# Patient Record
Sex: Male | Born: 1965 | Race: White | Hispanic: No | Smoking: Never smoker
Health system: Southern US, Community
[De-identification: ages and names within clinical notes are randomized; demographics above are authoritative.]

## PROBLEM LIST (undated history)

## (undated) DIAGNOSIS — L97519 Non-pressure chronic ulcer of other part of right foot with unspecified severity: Secondary | ICD-10-CM

## (undated) DIAGNOSIS — T8859XA Other complications of anesthesia, initial encounter: Secondary | ICD-10-CM

## (undated) DIAGNOSIS — G4730 Sleep apnea, unspecified: Secondary | ICD-10-CM

## (undated) DIAGNOSIS — E11621 Type 2 diabetes mellitus with foot ulcer: Secondary | ICD-10-CM

## (undated) DIAGNOSIS — E669 Obesity, unspecified: Secondary | ICD-10-CM

## (undated) DIAGNOSIS — F329 Major depressive disorder, single episode, unspecified: Secondary | ICD-10-CM

## (undated) DIAGNOSIS — U071 COVID-19: Secondary | ICD-10-CM

## (undated) DIAGNOSIS — F419 Anxiety disorder, unspecified: Secondary | ICD-10-CM

## (undated) DIAGNOSIS — T7840XA Allergy, unspecified, initial encounter: Secondary | ICD-10-CM

## (undated) DIAGNOSIS — E119 Type 2 diabetes mellitus without complications: Secondary | ICD-10-CM

## (undated) DIAGNOSIS — F32A Depression, unspecified: Secondary | ICD-10-CM

## (undated) DIAGNOSIS — Z87442 Personal history of urinary calculi: Secondary | ICD-10-CM

## (undated) DIAGNOSIS — G709 Myoneural disorder, unspecified: Secondary | ICD-10-CM

## (undated) HISTORY — DX: Anxiety disorder, unspecified: F41.9

## (undated) HISTORY — DX: Sleep apnea, unspecified: G47.30

## (undated) HISTORY — DX: Myoneural disorder, unspecified: G70.9

## (undated) HISTORY — PX: AMPUTATION: SHX166

## (undated) HISTORY — DX: Allergy, unspecified, initial encounter: T78.40XA

## (undated) HISTORY — DX: Depression, unspecified: F32.A

## (undated) HISTORY — DX: Major depressive disorder, single episode, unspecified: F32.9

---

## 2002-08-29 HISTORY — PX: SPINE SURGERY: SHX786

## 2011-04-19 ENCOUNTER — Other Ambulatory Visit: Payer: Self-pay | Admitting: Podiatry

## 2011-08-15 ENCOUNTER — Other Ambulatory Visit: Payer: Self-pay | Admitting: Podiatry

## 2011-09-26 ENCOUNTER — Other Ambulatory Visit: Payer: Self-pay | Admitting: Podiatry

## 2011-09-30 LAB — WOUND CULTURE

## 2011-11-07 ENCOUNTER — Other Ambulatory Visit: Payer: Self-pay | Admitting: Podiatry

## 2011-11-11 LAB — WOUND CULTURE

## 2012-02-28 ENCOUNTER — Encounter: Payer: Self-pay | Admitting: Cardiothoracic Surgery

## 2012-02-28 ENCOUNTER — Encounter: Payer: Self-pay | Admitting: Nurse Practitioner

## 2012-06-05 ENCOUNTER — Ambulatory Visit: Payer: Self-pay | Admitting: Podiatry

## 2012-09-29 HISTORY — PX: FOOT SURGERY: SHX648

## 2013-01-24 ENCOUNTER — Ambulatory Visit (INDEPENDENT_AMBULATORY_CARE_PROVIDER_SITE_OTHER): Payer: BC Managed Care – PPO | Admitting: Internal Medicine

## 2013-01-24 ENCOUNTER — Encounter: Payer: Self-pay | Admitting: Internal Medicine

## 2013-01-24 VITALS — BP 118/78 | HR 92 | Temp 98.5°F | Resp 16 | Ht 70.0 in | Wt 234.8 lb

## 2013-01-24 DIAGNOSIS — Z87438 Personal history of other diseases of male genital organs: Secondary | ICD-10-CM

## 2013-01-24 DIAGNOSIS — G5793 Unspecified mononeuropathy of bilateral lower limbs: Secondary | ICD-10-CM

## 2013-01-24 DIAGNOSIS — L97512 Non-pressure chronic ulcer of other part of right foot with fat layer exposed: Secondary | ICD-10-CM

## 2013-01-24 DIAGNOSIS — E669 Obesity, unspecified: Secondary | ICD-10-CM

## 2013-01-24 DIAGNOSIS — G4733 Obstructive sleep apnea (adult) (pediatric): Secondary | ICD-10-CM | POA: Insufficient documentation

## 2013-01-24 DIAGNOSIS — M86671 Other chronic osteomyelitis, right ankle and foot: Secondary | ICD-10-CM | POA: Insufficient documentation

## 2013-01-24 DIAGNOSIS — D582 Other hemoglobinopathies: Secondary | ICD-10-CM

## 2013-01-24 DIAGNOSIS — E1140 Type 2 diabetes mellitus with diabetic neuropathy, unspecified: Secondary | ICD-10-CM | POA: Insufficient documentation

## 2013-01-24 DIAGNOSIS — Z87898 Personal history of other specified conditions: Secondary | ICD-10-CM

## 2013-01-24 DIAGNOSIS — L97409 Non-pressure chronic ulcer of unspecified heel and midfoot with unspecified severity: Secondary | ICD-10-CM

## 2013-01-24 DIAGNOSIS — G609 Hereditary and idiopathic neuropathy, unspecified: Secondary | ICD-10-CM

## 2013-01-24 NOTE — Assessment & Plan Note (Signed)
Secondary to prolonged disability from foot infection. Screening for thyroid, hyperlipidemia and dm. Ordered .  Low GI diet recommended pending evaluation of renal function

## 2013-01-24 NOTE — Assessment & Plan Note (Signed)
Suspected by history.  Patient prefers to lose weight rather than go for a sleep study right now.

## 2013-01-24 NOTE — Assessment & Plan Note (Signed)
Recurrent,  Suggestive of BPH.  Will need DRE and prostate exam

## 2013-01-24 NOTE — Assessment & Plan Note (Signed)
Secondary to sciatica

## 2013-01-24 NOTE — Patient Instructions (Addendum)
Return ASAP for fasting labs   Physical in 3 months   Pick up diet from [place] at [employer]

## 2013-01-24 NOTE — Assessment & Plan Note (Signed)
S/p debridement and incision and drainage of either an abscess or cyst. Awaiting records from podiatry.

## 2013-01-24 NOTE — Progress Notes (Signed)
Patient ID: [name], male   DOB: 1966/06/27, 47 y.o.   MRN: [id number]   Patient Active Problem List   Diagnosis Date Noted  . 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 Neuropathy of both feet 01/24/2013  . OSA (obstructive sleep apnea) 01/24/2013  . Obesity (BMI 30-39.9) 01/24/2013  . History of prostatitis 01/24/2013  . Abnormal hemoglobin 01/24/2013    Subjective:  CC:   Chief Complaint  Patient presents with  . Establish Care    HPI:   [name] is a 47 y.o. male who presents as a new patient to establish primary care with the chief complaint of  Right foot problems.  Started 3 years ago , preexisting neuropathy from disk problem, developed an infection after a cut on bottom of  Foot. Developed systemic symptoms and saw podiatrist [name] who said it was fine.  Recurrent swelling and systemic symptoms   Eventually treated for foot infection by [name] ,but would not heal completely so she was  referred to [name]. who surgically removed a "cyst" which finally allowed the interior to heal .  was on abx for 1.5 years .  2 years have gone by,  Which caused him to lose work and gain weight (stopped working out at gym ,  Gained 50 lbs)  and develop Depression from being combined to the house. Following up with Wound Care for wound vac,  With skin graft planned.   Foot still swells  But the ulcer has closed to 1.5 cm .    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   History of prostatitis 2 years ago treated by [name] .  2 or 3 episodes in the past .  DRE /prostate exam noted enlargement at the time of infection.  PSA was normal.  He has No urinary hesitancy or weak stream but dribbles after he is done.   [name] sleeping,  Wife says she snores, and notes apnea.  Has daytime somnolence and wakes up tired. Does not want to have a sleep study bc he feels the problem is his obesity and wants to lose the weight. Used to be an avid  exerciser.   Up to date on eye exams.  Has herpes opthalmicus requiring medication for flares   Was told he had "thick " blood never screened  for iron overload   Past Medical History  Diagnosis Date  . Depression   . Allergy     Past Surgical History  Procedure Laterality Date  . Spine surgery  2004    nerve damage in spine  . Foot surgery Right 02/14    cyst removed    Family History  Problem Relation Age of Onset  . Heart disease Father   . Stroke Father   . Heart disease Maternal Grandmother   . Stroke Maternal Grandmother   . Heart disease Paternal Grandfather   . Stroke Paternal Grandfather     History   Social History  . Marital Status: Married    Spouse Name: N/A    Number of Children: N/A  . Years of Education: N/A   Occupational History  . Not on file.   Social History Main Topics  . Smoking status: Never Smoker   . Smokeless tobacco: Never Used  . Alcohol Use: No  . Drug Use: No  . Sexually Active: Yes   Other Topics Concern  . Not on file   Social History  Narrative  . No narrative on file   No Known Allergies   Review of Systems:   Patient denies headache, fevers, malaise, unintentional weight loss, skin rash, eye pain, sinus congestion and sinus pain, sore throat, dysphagia,  hemoptysis , cough, dyspnea, wheezing, chest pain, palpitations, orthopnea, edema, abdominal pain, nausea, melena, diarrhea, constipation, flank pain, dysuria, hematuria, urinary  Frequency, nocturia, numbness, tingling, seizures,  Focal weakness, Loss of consciousness,  Tremor, insomnia, depression, anxiety, and suicidal ideation.     Objective:  BP 118/78  Pulse 92  Temp(Src) 98.5 F (36.9 C) (Oral)  Resp 16  Ht 5\' 10"  (1.778 m)  Wt 234 lb 12 oz (106.482 kg)  BMI 33.68 kg/m2  SpO2 97%  General appearance: alert, cooperative and appears stated age Ears: normal TM's and external ear canals both ears Throat: lips, mucosa, and tongue normal; teeth and gums  normal Neck: no adenopathy, no carotid bruit, supple, symmetrical, trachea midline and thyroid not enlarged, symmetric, no tenderness/mass/nodules Back: symmetric, no curvature. ROM normal. No CVA tenderness. Lungs: clear to auscultation bilaterally Heart: regular rate and rhythm, S1, S2 normal, no murmur, click, rub or gallop Abdomen: soft, non-tender; bowel sounds normal; no masses,  no organomegaly Pulses: 2+ and symmetric Skin: Skin color, texture, turgor normal. No rashes or lesions Lymph nodes: Cervical, supraclavicular, and axillary nodes normal.  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 Awaiting records from podiatry.   Neuropathy of both feet Secondary to sciatica   OSA (obstructive sleep apnea) Suspected by history.  Patient prefers to lose weight rather than go for a sleep study right now.   Obesity (BMI 30-39.9) Secondary to prolonged disability from foot infection. Screening for thyroid, hyperlipidemia and dm. Ordered .  Low GI diet recommended pending evaluation of renal function   History of prostatitis Recurrent,  Suggestive of BPH.  Will need DRE and prostate exam   Abnormal hemoglobin Screening for iron overload.   Updated Medication List Outpatient Encounter Prescriptions as of 01/24/2013  Medication Sig Dispense Refill  . Multiple Vitamins-Minerals (MULTIVITAMIN WITH MINERALS) tablet Take 1 tablet by mouth daily.       No facility-administered encounter medications on file as of 01/24/2013.     Orders Placed This Encounter  Procedures  . CBC with Differential  . Comprehensive metabolic panel  . TSH  . Lipid panel  . PSA    No Follow-up on file.

## 2013-01-24 NOTE — Assessment & Plan Note (Signed)
Screening for iron overload.

## 2013-01-28 ENCOUNTER — Other Ambulatory Visit: Payer: BC Managed Care – PPO

## 2013-01-30 ENCOUNTER — Encounter: Payer: Self-pay | Admitting: Cardiothoracic Surgery

## 2013-01-30 ENCOUNTER — Encounter: Payer: Self-pay | Admitting: Nurse Practitioner

## 2013-02-01 ENCOUNTER — Other Ambulatory Visit (INDEPENDENT_AMBULATORY_CARE_PROVIDER_SITE_OTHER): Payer: BC Managed Care – PPO

## 2013-02-01 DIAGNOSIS — L97409 Non-pressure chronic ulcer of unspecified heel and midfoot with unspecified severity: Secondary | ICD-10-CM

## 2013-02-01 DIAGNOSIS — L97512 Non-pressure chronic ulcer of other part of right foot with fat layer exposed: Secondary | ICD-10-CM

## 2013-02-01 DIAGNOSIS — Z87438 Personal history of other diseases of male genital organs: Secondary | ICD-10-CM

## 2013-02-01 DIAGNOSIS — E669 Obesity, unspecified: Secondary | ICD-10-CM

## 2013-02-01 DIAGNOSIS — Z87898 Personal history of other specified conditions: Secondary | ICD-10-CM

## 2013-02-01 LAB — COMPREHENSIVE METABOLIC PANEL
ALT: 44 U/L (ref 0–53)
AST: 37 U/L (ref 0–37)
Albumin: 3.7 g/dL (ref 3.5–5.2)
Alkaline Phosphatase: 48 U/L (ref 39–117)
BUN: 16 mg/dL (ref 6–23)
CO2: 26 mEq/L (ref 19–32)
Calcium: 8.8 mg/dL (ref 8.4–10.5)
Chloride: 106 mEq/L (ref 96–112)
Creatinine, Ser: 0.9 mg/dL (ref 0.4–1.5)
GFR: 91.98 mL/min (ref >60.00)
Glucose, Bld: 99 mg/dL (ref 70–99)
Potassium: 4.4 mEq/L (ref 3.5–5.1)
Sodium: 136 mEq/L (ref 135–145)
Total Bilirubin: 0.9 mg/dL (ref 0.3–1.2)
Total Protein: 7.4 g/dL (ref 6.0–8.3)

## 2013-02-01 LAB — CBC WITH DIFFERENTIAL/PLATELET
Basophils Absolute: 0.1 10*3/uL (ref 0.0–0.1)
Basophils Relative: 1.8 % (ref 0.0–3.0)
Eosinophils Absolute: 0.3 10*3/uL (ref 0.0–0.7)
Eosinophils Relative: 4.6 % (ref 0.0–5.0)
HCT: 50.1 % (ref 39.0–52.0)
Hemoglobin: 16.8 g/dL (ref 13.0–17.0)
Lymphocytes Relative: 27.3 % (ref 12.0–46.0)
Lymphs Abs: 1.8 10*3/uL (ref 0.7–4.0)
MCHC: 33.6 g/dL (ref 30.0–36.0)
MCV: 91.1 fl (ref 78.0–100.0)
Monocytes Absolute: 0.8 10*3/uL (ref 0.1–1.0)
Monocytes Relative: 12.8 % — ABNORMAL HIGH (ref 3.0–12.0)
Neutro Abs: 3.5 10*3/uL (ref 1.4–7.7)
Neutrophils Relative %: 53.5 % (ref 43.0–77.0)
Platelets: 213 10*3/uL (ref 150.0–400.0)
RBC: 5.5 Mil/uL (ref 4.22–5.81)
RDW: 15.6 % — ABNORMAL HIGH (ref 11.5–14.6)
WBC: 6.6 10*3/uL (ref 4.5–10.5)

## 2013-02-01 LAB — LIPID PANEL
Cholesterol: 175 mg/dL (ref 0–200)
HDL: 33.3 mg/dL — ABNORMAL LOW (ref >39.00)
LDL Cholesterol: 118 mg/dL — ABNORMAL HIGH (ref 0–99)
Total CHOL/HDL Ratio: 5
Triglycerides: 117 mg/dL (ref 0.0–149.0)
VLDL: 23.4 mg/dL (ref 0.0–40.0)

## 2013-02-01 LAB — TSH: TSH: 1.4 u[IU]/mL (ref 0.35–5.50)

## 2013-02-01 LAB — PSA: PSA: 0.38 ng/mL (ref 0.10–4.00)

## 2013-02-12 ENCOUNTER — Ambulatory Visit: Payer: Self-pay | Admitting: Nurse Practitioner

## 2013-02-15 ENCOUNTER — Ambulatory Visit: Payer: Self-pay | Admitting: Nurse Practitioner

## 2013-04-22 ENCOUNTER — Telehealth: Payer: Self-pay | Admitting: Internal Medicine

## 2013-04-22 NOTE — Telephone Encounter (Signed)
The patient was told that his labs were normal ,but when he went to [facility] he was told his hemaglobin was low and his cholesterol was high according to the labs that they pulled from the records here. His wife is wanting a call back to have someone read the lab results to her . She also wants a copy sent in the mail.

## 2013-04-30 NOTE — Telephone Encounter (Signed)
Patient advised of labs as normal and explained with in limits left on voicemail per DPR and labs mailed to patient.

## 2013-04-30 NOTE — Telephone Encounter (Signed)
The patient is wanting a copy of his labs sent to him in the mail.

## 2013-05-08 ENCOUNTER — Ambulatory Visit (INDEPENDENT_AMBULATORY_CARE_PROVIDER_SITE_OTHER): Payer: BC Managed Care – PPO | Admitting: Internal Medicine

## 2013-05-08 ENCOUNTER — Encounter: Payer: Self-pay | Admitting: Internal Medicine

## 2013-05-08 VITALS — BP 120/80 | HR 84 | Temp 98.2°F | Resp 14 | Ht 70.0 in | Wt 234.5 lb

## 2013-05-08 DIAGNOSIS — E669 Obesity, unspecified: Secondary | ICD-10-CM

## 2013-05-08 DIAGNOSIS — Z87438 Personal history of other diseases of male genital organs: Secondary | ICD-10-CM

## 2013-05-08 DIAGNOSIS — Z87898 Personal history of other specified conditions: Secondary | ICD-10-CM

## 2013-05-08 DIAGNOSIS — Z0000 Encounter for general adult medical examination without abnormal findings: Secondary | ICD-10-CM

## 2013-05-08 DIAGNOSIS — L97512 Non-pressure chronic ulcer of other part of right foot with fat layer exposed: Secondary | ICD-10-CM

## 2013-05-08 DIAGNOSIS — D582 Other hemoglobinopathies: Secondary | ICD-10-CM

## 2013-05-08 DIAGNOSIS — L97409 Non-pressure chronic ulcer of unspecified heel and midfoot with unspecified severity: Secondary | ICD-10-CM

## 2013-05-08 DIAGNOSIS — Z23 Encounter for immunization: Secondary | ICD-10-CM

## 2013-05-08 LAB — PSA: PSA: 0.35 ng/mL (ref 0.10–4.00)

## 2013-05-08 LAB — URINALYSIS
Bilirubin Urine: NEGATIVE
Ketones, ur: NEGATIVE
Leukocytes, UA: NEGATIVE
Nitrite: NEGATIVE
Specific Gravity, Urine: >=1.03 (ref 1.000–1.030)
Total Protein, Urine: NEGATIVE
Urine Glucose: NEGATIVE
Urobilinogen, UA: 0.2 (ref 0.0–1.0)
pH: 6 (ref 5.0–8.0)

## 2013-05-08 NOTE — Progress Notes (Signed)
Patient ID: [name], male   DOB: 03/23/1966, 47 y.o.   MRN: [id number]  Patient Active Problem List   Diagnosis Date Noted  . Routine general medical examination at a health care facility 05/09/2013  . 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 Neuropathy of both feet 01/24/2013  . OSA (obstructive sleep apnea) 01/24/2013  . Obesity (BMI 30-39.9) 01/24/2013  . History of prostatitis 01/24/2013  . Abnormal hemoglobin 01/24/2013    Subjective:  CC:   Chief Complaint  Patient presents with  . Annual Exam    HPI:   [name] a 47 y.o. male who presents for an  annual exam. He continues to have aright foot ulcer,  it occurred as a result of bilateral neuropathy secondary to sciatica. He also has been present for nearly 3 years now and is now currently managed by [facility] podiatry since local treatment by Dr. Orland Jarred followed by Dr. Al Corpus fail to resolve his ulcer. Had a metatarsal removed due to osteomyelitis  at [facility]  6 weeks ago .  Still has another bone infected and trying to decide whether to remove bone or do long term antibiotics  Was sent by [facility] to internal medicine at [facility] and told he had hyperlipidemia and anemia > LDL was < 130.   Past Medical History  Diagnosis Date  . Depression   . Allergy     Past Surgical History  Procedure Laterality Date  . Spine surgery  2004    nerve damage in spine  . Foot surgery Right 02/14    cyst removed       The following portions of the patient's history were reviewed and updated as appropriate: Allergies, current medications, and problem list.    Review of Systems:   12 Pt  review of systems was negative except those addressed in the HPI,     History   Social History  . Marital Status: Married    Spouse Name: N/A    Number of Children: N/A  . Years of Education: N/A   Occupational History  . Not on file.   Social History Main Topics  . Smoking status: Never Smoker   . Smokeless  tobacco: Never Used  . Alcohol Use: No  . Drug Use: No  . Sexual Activity: Yes   Other Topics Concern  . Not on file   Social History Narrative  . No narrative on file    Objective:  Filed Vitals:   05/08/13 0852  BP: 120/80  Pulse: 84  Temp: 98.2 F (36.8 C)  Resp: 14     General appearance: alert, cooperative and appears stated age Ears: normal TM's and external ear canals both ears Throat: lips, mucosa, and tongue normal; teeth and gums normal Neck: no adenopathy, no carotid bruit, supple, symmetrical, trachea midline and thyroid not enlarged, symmetric, no tenderness/mass/nodules Back: symmetric, no curvature. ROM normal. No CVA tenderness. Lungs: clear to auscultation bilaterally Heart: regular rate and rhythm, S1, S2 normal, no murmur, click, rub or gallop Abdomen: soft, non-tender; bowel sounds normal; no masses,  no organomegaly Pulses: 2+ and symmetric Skin: Skin color, texture, turgor normal. No rashes or lesions Lymph nodes: Cervical, supraclavicular, and axillary nodes normal.  Assessment and Plan:  History of prostatitis Prostate exam was normal today and PSA is pending.  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 Surgery was done at  [facility] to podiatry. He has persistent osteomyelitis involving another metatarsal and is considering additional surgery versus prolonged IV therapy with antibiotics and he is wanting my opinion. Since the toe that is affected is no longer weight bearing and he has already been on antibiotics with no significant improvement IM inclined to go with the surgeon meds indication of the great toe is the best option since it continues to have a sinus tract. He has been in a boot now for nearly 3 years and wants to get on with his life.  Abnormal hemoglobin He was screened for iron overload at last visit and has no signs of  such.  Obesity (BMI 30-39.9) His ability to lose weight has been hampered by his osteomyelitis and foot ulcer.I have addressed  BMI and recommended wt loss of 10% of body weight over the next 6 months using a low glycemic index diet and when able, regular exercise a minimum of 5 days per week.    Routine general medical examination at a health care facility Annual male exam was done including testicular and prostate exam. PSA is pending .     Updated Medication List Outpatient Encounter Prescriptions as of 05/08/2013  Medication Sig Dispense Refill  . amoxicillin-clavulanate (AUGMENTIN) 875-125 MG per tablet Take 1 tablet by mouth 2 (two) times daily. Take 1 tablet by mouth Two (2) times a day. for 7 days      . Multiple Vitamins-Minerals (MULTIVITAMIN WITH MINERALS) tablet Take 1 tablet by mouth daily.       No facility-administered encounter medications on file as of 05/08/2013.     Orders Placed This Encounter  Procedures  . Flu Vaccine QUAD 36+ mos PF IM (Fluarix)  . Tdap vaccine greater than or equal to 7yo IM  . PSA  . Urinalysis    No Follow-up on file.

## 2013-05-08 NOTE — Patient Instructions (Addendum)

## 2013-05-09 ENCOUNTER — Encounter: Payer: Self-pay | Admitting: Internal Medicine

## 2013-05-09 DIAGNOSIS — Z0000 Encounter for general adult medical examination without abnormal findings: Secondary | ICD-10-CM | POA: Insufficient documentation

## 2013-05-09 NOTE — Assessment & Plan Note (Signed)
Patient is now status post metatarsal indication for osteomyelitis due to persistent sinus tract. Surgery was done at [facility] to podiatry. He has persistent osteomyelitis involving another metatarsal and is considering additional surgery versus prolonged IV therapy with antibiotics and he is wanting my opinion. Since the toe that is affected is no longer weight bearing and he has already been on antibiotics with no significant improvement IM inclined to go with the surgeon meds indication of the great toe is the best option since it continues to have a sinus tract. He has been in a boot now for nearly 3 years and wants to get on with his life.

## 2013-05-09 NOTE — Assessment & Plan Note (Signed)
He was screened for iron overload at last visit and has no signs of such.

## 2013-05-09 NOTE — Assessment & Plan Note (Signed)
Prostate exam was normal today and PSA is pending.

## 2013-05-09 NOTE — Assessment & Plan Note (Signed)
Annual male exam was done including testicular and prostate exam. PSA is pending .     

## 2013-05-09 NOTE — Assessment & Plan Note (Signed)
His ability to lose weight has been hampered by his osteomyelitis and foot ulcer.I have addressed  BMI and recommended wt loss of 10% of body weight over the next 6 months using a low glycemic index diet and when able, regular exercise a minimum of 5 days per week.

## 2013-05-10 ENCOUNTER — Encounter: Payer: Self-pay | Admitting: *Deleted

## 2013-05-20 ENCOUNTER — Encounter: Payer: Self-pay | Admitting: Podiatry

## 2013-10-24 ENCOUNTER — Ambulatory Visit: Payer: BC Managed Care – PPO | Admitting: Internal Medicine

## 2013-10-29 ENCOUNTER — Ambulatory Visit: Payer: BC Managed Care – PPO | Admitting: Internal Medicine

## 2013-11-05 ENCOUNTER — Encounter: Payer: Self-pay | Admitting: Internal Medicine

## 2013-11-05 ENCOUNTER — Ambulatory Visit (INDEPENDENT_AMBULATORY_CARE_PROVIDER_SITE_OTHER): Payer: BC Managed Care – PPO | Admitting: Internal Medicine

## 2013-11-05 VITALS — BP 116/70 | HR 101 | Temp 97.9°F | Resp 16 | Wt 236.5 lb

## 2013-11-05 DIAGNOSIS — R6882 Decreased libido: Secondary | ICD-10-CM

## 2013-11-05 DIAGNOSIS — R5381 Other malaise: Secondary | ICD-10-CM

## 2013-11-05 DIAGNOSIS — R0609 Other forms of dyspnea: Secondary | ICD-10-CM

## 2013-11-05 DIAGNOSIS — F520 Hypoactive sexual desire disorder: Secondary | ICD-10-CM

## 2013-11-05 DIAGNOSIS — R0989 Other specified symptoms and signs involving the circulatory and respiratory systems: Secondary | ICD-10-CM

## 2013-11-05 DIAGNOSIS — E669 Obesity, unspecified: Secondary | ICD-10-CM

## 2013-11-05 DIAGNOSIS — M869 Osteomyelitis, unspecified: Secondary | ICD-10-CM

## 2013-11-05 DIAGNOSIS — R5383 Other fatigue: Principal | ICD-10-CM

## 2013-11-05 DIAGNOSIS — R0683 Snoring: Secondary | ICD-10-CM

## 2013-11-05 DIAGNOSIS — G4710 Hypersomnia, unspecified: Secondary | ICD-10-CM

## 2013-11-05 DIAGNOSIS — E559 Vitamin D deficiency, unspecified: Secondary | ICD-10-CM

## 2013-11-05 DIAGNOSIS — G4733 Obstructive sleep apnea (adult) (pediatric): Secondary | ICD-10-CM

## 2013-11-05 NOTE — Patient Instructions (Signed)
We discussed testing you for sleep apnea,  Low testosterone,  Low thyroid etc.  If all the tests are negative,  Treating your for depression may help.  Please schedules a fasting lab appt ASAP  Also schedule a return visit with me in 4 weeks  Amber will call you with the appt for the sleep study.  I will need to call you in something to take before the study to help you fall asleep

## 2013-11-05 NOTE — Progress Notes (Signed)
Patient ID: [name], male   DOB: [birth year], 48 y.o.   MRN: [id number]  Patient Active Problem List   Diagnosis Date Noted  . Other malaise and fatigue 11/06/2013  . Routine general medical examination at a health care facility 05/09/2013  . Chronic osteomyelitis of right foot 01/24/2013  . Neuropathy of both feet 01/24/2013  . OSA (obstructive sleep apnea) 01/24/2013  . Obesity (BMI 30-39.9) 01/24/2013  . History of prostatitis 01/24/2013  . Abnormal hemoglobin 01/24/2013    Subjective:  CC:   Chief Complaint  Patient presents with  . Depression    Male issues    HPI:   [name] is a 48 y.o. male who presents for Decreased energy, lack of libido,  Snoring ,  Fatigue and Anhedonia.  Patient last seen Sept 2014.  has had a long ordeal with chronic osteomyelitisof the right foot which started with a nonhealing ulcer 3 years ago,  requiring prolonged  treatment including recurrent amputations.  He finally agreed to amputation of the great toe last Fall and the infection is considered resolved per patient.  His treatment is finally complete,  toes are improving, (right foot) and he was finally been cleared to return to his former level of activity which included regular exercise at  the gym.  He has been starting slowly, using dumbbells for the past few weeks.  Despite being back at the gym for a month,  He notes lack of energy,  And has to force himself to go to the gym.  His mother in law suggested that he may have  low testosterone.  He states that he is able to have an erection , but has not had a sexual relationship with wife in a while and has only masturbated, but has noted that his erections are less strong.     His wife reports loud snoring and is concerned he may have sleep apnea. He has gained a significant amount of weight due to inactivity and has a large neck and central obesity.    Past Medical History  Diagnosis Date  . Depression   . Allergy     Past  Surgical History  Procedure Laterality Date  . Spine surgery  2004    nerve damage in spine  . Foot surgery Right 02/14    cyst removed       The following portions of the patient's history were reviewed and updated as appropriate: Allergies, current medications, and problem list.    Review of Systems:   Patient denies headache, fevers, malaise, unintentional weight loss, skin rash, eye pain, sinus congestion and sinus pain, sore throat, dysphagia,  hemoptysis , cough, dyspnea, wheezing, chest pain, palpitations, orthopnea, edema, abdominal pain, nausea, melena, diarrhea, constipation, flank pain, dysuria, hematuria, urinary  Frequency, nocturia, numbness, tingling, seizures,  Focal weakness, Loss of consciousness,  Tremor, insomnia, depression, anxiety, and suicidal ideation.     History   Social History  . Marital Status: Married    Spouse Name: N/A    Number of Children: N/A  . Years of Education: N/A   Occupational History  . Not on file.   Social History Main Topics  . Smoking status: Never Smoker   . Smokeless tobacco: Never Used  . Alcohol Use: No  . Drug Use: No  . Sexual Activity: Yes   Other Topics Concern  . Not on file   Social History Narrative  . No narrative on file    Objective:  Filed Vitals:  11/05/13 1406  BP: 116/70  Pulse: 101  Temp: 97.9 F (36.6 C)  Resp: 16     General appearance: alert, cooperative and appears stated age Ears: normal TM's and external ear canals both ears Throat: lips, mucosa, and tongue normal; teeth and gums normal Neck: no adenopathy, no carotid bruit, supple, symmetrical, trachea midline and thyroid not enlarged, symmetric, no tenderness/mass/nodules Back: symmetric, no curvature. ROM normal. No CVA tenderness. Lungs: clear to auscultation bilaterally Heart: regular rate and rhythm, S1, S2 normal, no murmur, click, rub or gallop Abdomen: soft, non-tender; bowel sounds normal; no masses,  no  organomegaly Pulses: 2+ and symmetric Skin: Skin color, texture, turgor normal. No rashes or lesions Lymph nodes: Cervical, supraclavicular, and axillary nodes normal.  Assessment and Plan:  Obesity (BMI 30-39.9) I have addressed  BMI and recommended wt loss of 10% of body weight over the next 6 months using a low glycemic index diet and regular exercise a minimum of 5 days per week.  He will return for fasting labs and screening for DM.    OSA (obstructive sleep apnea) Suspected by history and wife's observations .  Patient is now willing to have a sleep study given his inabilkity to lose weight in 6 months  And multiple symptoms .  Chronic osteomyelitis of right foot Patient is now status post metatarsal indication for osteomyelitis due to persistent sinus tract. All Surgery has been done at [facility]. He has finally had resolution after 3 years of persistent infection s/p great toe amputation   Other malaise and fatigue Etiology may be low T4,  Low T, untreated OSA, anemia, or depression.  Screening labs and sleep study ordered,  Will consider trial of wellbutrin if labs are not indicate of a metabolic cause.   A total of 40 minutes was spent with patient more than half of which was spent in counseling, reviewing records from other prviders and coordination of care.  Updated Medication List Outpatient Encounter Prescriptions as of 11/05/2013  Medication Sig  . Multiple Vitamins-Minerals (MULTIVITAMIN WITH MINERALS) tablet Take 1 tablet by mouth daily.  Marland Kitchen oxyCODONE (OXY IR/ROXICODONE) 5 MG immediate release tablet Take 5 mg by mouth every 6 (six) hours as needed.      Orders Placed This Encounter  Procedures  . CBC with Differential  . Comprehensive metabolic panel  . TSH  . Lipid panel  . Testosterone, free, total  . Vit D  25 hydroxy (rtn osteoporosis monitoring)  . Hemoglobin A1c  . Nocturnal polysomnography (NPSG)    No Follow-up on file.

## 2013-11-05 NOTE — Progress Notes (Signed)
Pre-visit discussion using our clinic review tool. No additional management support is needed unless otherwise documented below in the visit note.  

## 2013-11-06 ENCOUNTER — Other Ambulatory Visit (INDEPENDENT_AMBULATORY_CARE_PROVIDER_SITE_OTHER): Payer: BC Managed Care – PPO

## 2013-11-06 ENCOUNTER — Encounter: Payer: Self-pay | Admitting: Internal Medicine

## 2013-11-06 DIAGNOSIS — R5381 Other malaise: Secondary | ICD-10-CM | POA: Insufficient documentation

## 2013-11-06 DIAGNOSIS — F32A Depression, unspecified: Secondary | ICD-10-CM

## 2013-11-06 DIAGNOSIS — F329 Major depressive disorder, single episode, unspecified: Secondary | ICD-10-CM

## 2013-11-06 DIAGNOSIS — E669 Obesity, unspecified: Secondary | ICD-10-CM

## 2013-11-06 DIAGNOSIS — R5383 Other fatigue: Secondary | ICD-10-CM

## 2013-11-06 DIAGNOSIS — F520 Hypoactive sexual desire disorder: Secondary | ICD-10-CM

## 2013-11-06 DIAGNOSIS — E559 Vitamin D deficiency, unspecified: Secondary | ICD-10-CM

## 2013-11-06 LAB — CBC WITH DIFFERENTIAL/PLATELET
Basophils Absolute: 0.1 10*3/uL (ref 0.0–0.1)
Basophils Relative: 1.3 % (ref 0.0–3.0)
Eosinophils Absolute: 0.3 10*3/uL (ref 0.0–0.7)
Eosinophils Relative: 5.2 % — ABNORMAL HIGH (ref 0.0–5.0)
HCT: 47.6 % (ref 39.0–52.0)
Hemoglobin: 16.2 g/dL (ref 13.0–17.0)
Lymphocytes Relative: 31.7 % (ref 12.0–46.0)
Lymphs Abs: 2 10*3/uL (ref 0.7–4.0)
MCHC: 33.9 g/dL (ref 30.0–36.0)
MCV: 92.9 fl (ref 78.0–100.0)
Monocytes Absolute: 0.8 10*3/uL (ref 0.1–1.0)
Monocytes Relative: 12.7 % — ABNORMAL HIGH (ref 3.0–12.0)
Neutro Abs: 3.1 10*3/uL (ref 1.4–7.7)
Neutrophils Relative %: 49.1 % (ref 43.0–77.0)
Platelets: 183 10*3/uL (ref 150.0–400.0)
RBC: 5.13 Mil/uL (ref 4.22–5.81)
RDW: 14.4 % (ref 11.5–14.6)
WBC: 6.3 10*3/uL (ref 4.5–10.5)

## 2013-11-06 LAB — COMPREHENSIVE METABOLIC PANEL
ALT: 45 U/L (ref 0–53)
AST: 48 U/L — ABNORMAL HIGH (ref 0–37)
Albumin: 4 g/dL (ref 3.5–5.2)
Alkaline Phosphatase: 54 U/L (ref 39–117)
BUN: 12 mg/dL (ref 6–23)
CO2: 22 mEq/L (ref 19–32)
Calcium: 8.5 mg/dL (ref 8.4–10.5)
Chloride: 106 mEq/L (ref 96–112)
Creatinine, Ser: 0.9 mg/dL (ref 0.4–1.5)
GFR: 96.46 mL/min (ref >60.00)
Glucose, Bld: 118 mg/dL — ABNORMAL HIGH (ref 70–99)
Potassium: 3.8 mEq/L (ref 3.5–5.1)
Sodium: 137 mEq/L (ref 135–145)
Total Bilirubin: 0.9 mg/dL (ref 0.3–1.2)
Total Protein: 7.4 g/dL (ref 6.0–8.3)

## 2013-11-06 LAB — HEMOGLOBIN A1C: Hgb A1c MFr Bld: 5.6 % (ref 4.6–6.5)

## 2013-11-06 LAB — LIPID PANEL
Cholesterol: 164 mg/dL (ref 0–200)
HDL: 33 mg/dL — ABNORMAL LOW (ref >39.00)
LDL Cholesterol: 107 mg/dL — ABNORMAL HIGH (ref 0–99)
Total CHOL/HDL Ratio: 5
Triglycerides: 119 mg/dL (ref 0.0–149.0)
VLDL: 23.8 mg/dL (ref 0.0–40.0)

## 2013-11-06 LAB — TSH: TSH: 1.35 u[IU]/mL (ref 0.35–5.50)

## 2013-11-06 NOTE — Assessment & Plan Note (Signed)
Suspected by history and wife's observations .  Patient is now willing to have a sleep study given his inabilkity to lose weight in 6 months  And multiple symptoms .

## 2013-11-06 NOTE — Assessment & Plan Note (Addendum)
Patient is now status post metatarsal indication for osteomyelitis due to persistent sinus tract. All Surgery has been done at [facility]. He has finally had resolution after 3 years of persistent infection s/p great toe amputation

## 2013-11-06 NOTE — Assessment & Plan Note (Signed)
Etiology may be low T4,  Low T, untreated OSA, anemia, or depression.  Screening labs and sleep study ordered,  Will consider trial of wellbutrin if labs are not indicate of a metabolic cause.

## 2013-11-06 NOTE — Assessment & Plan Note (Addendum)
I have addressed  BMI and recommended wt loss of 10% of body weight over the next 6 months using a low glycemic index diet and regular exercise a minimum of 5 days per week.  He will return for fasting labs and screening for DM.

## 2013-11-07 LAB — VITAMIN D 25 HYDROXY (VIT D DEFICIENCY, FRACTURES): Vit D, 25-Hydroxy: 23 ng/mL — ABNORMAL LOW (ref 30–89)

## 2013-11-07 LAB — TESTOSTERONE, FREE, TOTAL, SHBG
Sex Hormone Binding: 34 nmol/L (ref 13–71)
Testosterone, Free: 69.2 pg/mL (ref 47.0–244.0)
Testosterone-% Free: 2 % (ref 1.6–2.9)
Testosterone: 352 ng/dL (ref 300–890)

## 2013-11-08 ENCOUNTER — Encounter: Payer: Self-pay | Admitting: *Deleted

## 2013-11-08 DIAGNOSIS — F329 Major depressive disorder, single episode, unspecified: Secondary | ICD-10-CM | POA: Insufficient documentation

## 2013-11-08 DIAGNOSIS — F32A Depression, unspecified: Secondary | ICD-10-CM | POA: Insufficient documentation

## 2013-11-08 DIAGNOSIS — E559 Vitamin D deficiency, unspecified: Secondary | ICD-10-CM | POA: Insufficient documentation

## 2013-11-08 MED ORDER — BUPROPION HCL 100 MG PO TABS: 100.0000 mg | ORAL_TABLET | Freq: Two times a day (BID) | ORAL | Status: DC

## 2013-11-08 MED ORDER — ERGOCALCIFEROL 1.25 MG (50000 UT) PO CAPS: 50000.0000 [IU] | ORAL_CAPSULE | ORAL | Status: DC

## 2013-11-08 NOTE — Addendum Note (Signed)
Addended by: Crecencio Mc on: 11/08/2013 09:04 AM   Modules accepted: Orders

## 2013-11-08 NOTE — Addendum Note (Signed)
Addended by: Crecencio Mc on: 11/08/2013 09:02 AM   Modules accepted: Orders

## 2013-11-27 ENCOUNTER — Ambulatory Visit: Payer: Self-pay | Admitting: Internal Medicine

## 2013-12-03 ENCOUNTER — Telehealth: Payer: Self-pay | Admitting: Internal Medicine

## 2013-12-03 DIAGNOSIS — E785 Hyperlipidemia, unspecified: Secondary | ICD-10-CM

## 2013-12-03 DIAGNOSIS — E669 Obesity, unspecified: Secondary | ICD-10-CM

## 2013-12-03 NOTE — Telephone Encounter (Signed)
Please advise 

## 2013-12-03 NOTE — Telephone Encounter (Signed)
Appointment scheduled.

## 2013-12-03 NOTE — Telephone Encounter (Signed)
Fasting labs ordered

## 2013-12-03 NOTE — Telephone Encounter (Signed)
The patient is wanting blood work done prior to his office visit on 4.13.15. He has been on a diet and he wants to know if his labs have changed.

## 2013-12-04 ENCOUNTER — Telehealth: Payer: Self-pay | Admitting: Internal Medicine

## 2013-12-04 DIAGNOSIS — G4733 Obstructive sleep apnea (adult) (pediatric): Secondary | ICD-10-CM

## 2013-12-04 NOTE — Telephone Encounter (Signed)
He has Mild to moderate sleep apnea Confirmed with sleep study.  He will need a Repeat study with CPAP needed  Due o difficulty maintaining sleep.  Thi s has been ordered

## 2013-12-04 NOTE — Telephone Encounter (Signed)
Left message for patient to call office.  

## 2013-12-05 ENCOUNTER — Other Ambulatory Visit: Payer: BC Managed Care – PPO

## 2013-12-09 ENCOUNTER — Ambulatory Visit (INDEPENDENT_AMBULATORY_CARE_PROVIDER_SITE_OTHER): Payer: BC Managed Care – PPO | Admitting: Internal Medicine

## 2013-12-09 ENCOUNTER — Encounter: Payer: Self-pay | Admitting: Internal Medicine

## 2013-12-09 VITALS — BP 112/70 | HR 98 | Temp 97.6°F | Resp 16 | Wt 229.2 lb

## 2013-12-09 DIAGNOSIS — F32A Depression, unspecified: Secondary | ICD-10-CM

## 2013-12-09 DIAGNOSIS — G4733 Obstructive sleep apnea (adult) (pediatric): Secondary | ICD-10-CM

## 2013-12-09 DIAGNOSIS — M86671 Other chronic osteomyelitis, right ankle and foot: Secondary | ICD-10-CM

## 2013-12-09 DIAGNOSIS — F3289 Other specified depressive episodes: Secondary | ICD-10-CM

## 2013-12-09 DIAGNOSIS — M86679 Other chronic osteomyelitis, unspecified ankle and foot: Secondary | ICD-10-CM

## 2013-12-09 DIAGNOSIS — R5383 Other fatigue: Secondary | ICD-10-CM

## 2013-12-09 DIAGNOSIS — E669 Obesity, unspecified: Secondary | ICD-10-CM

## 2013-12-09 DIAGNOSIS — R5381 Other malaise: Secondary | ICD-10-CM

## 2013-12-09 DIAGNOSIS — E559 Vitamin D deficiency, unspecified: Secondary | ICD-10-CM

## 2013-12-09 DIAGNOSIS — F329 Major depressive disorder, single episode, unspecified: Secondary | ICD-10-CM

## 2013-12-09 MED ORDER — BUPROPION HCL 100 MG PO TABS: 100.0000 mg | ORAL_TABLET | Freq: Two times a day (BID) | ORAL | Status: DC

## 2013-12-09 MED ORDER — ERGOCALCIFEROL 1.25 MG (50000 UT) PO CAPS: 50000.0000 [IU] | ORAL_CAPSULE | ORAL | Status: DC

## 2013-12-09 MED ORDER — HYDROCODONE-ACETAMINOPHEN 10-325 MG PO TABS
1.0000 | ORAL_TABLET | Freq: Three times a day (TID) | ORAL | Status: DC | PRN
Start: 2013-12-09 — End: 2015-01-16

## 2013-12-09 NOTE — Assessment & Plan Note (Addendum)
Repeat study has not been done yet secondary to patient having a miserable study due to all the  Electrodes and monitors that were applied.  Discussion of long term complications of untreated OSA .  Ok to postpone CPAP titration study for a few months and continue with weight loss efforts.    

## 2013-12-09 NOTE — Progress Notes (Signed)
Patient ID: [name], male   DOB: 19-Oct-1965, 48 y.o.   MRN: [id number]  Patient Active Problem List   Diagnosis Date Noted  . Depression 11/08/2013  . Unspecified vitamin D deficiency 11/08/2013  . Other malaise and fatigue 11/06/2013  . Routine general medical examination at a health care facility 05/09/2013  . Chronic osteomyelitis of right foot 01/24/2013  . Neuropathy of both feet 01/24/2013  . OSA (obstructive sleep apnea) 01/24/2013  . Obesity (BMI 30-39.9) 01/24/2013  . History of prostatitis 01/24/2013  . Abnormal hemoglobin 01/24/2013    Subjective:  CC:   Chief Complaint  Patient presents with  . Follow-up    1 month    HPI:   [name] is a 48 y.o. male who presents for follow up on multiple issues  Raised one month ago, including  obesity,  Depression, dyslipidemia,  Glucose intolerance and newly diagnosed sleep apnea  He has Lost 7 lbs in one month following low GI diet .  Has not started exercise program yet because of concerns about recent foot surgery.   Sleep study confirmed OSA but experience was so terrible he has deferred CPAP titration study at this time  Due to out of pocket expenses and financial considerations.   Has not started medication for depression yet . Some financial concerns,  Concerns about side effects.    All questions answered fully  40 minute visit,     Past Medical History  Diagnosis Date  . Depression   . Allergy     Past Surgical History  Procedure Laterality Date  . Spine surgery  2004    nerve damage in spine  . Foot surgery Right 02/14    cyst removed       The following portions of the patient's history were reviewed and updated as appropriate: Allergies, current medications, and problem list.    Review of Systems:   Patient denies headache, fevers, malaise, unintentional weight loss, skin rash, eye pain, sinus congestion and sinus pain, sore throat, dysphagia,  hemoptysis , cough, dyspnea, wheezing,  chest pain, palpitations, orthopnea, edema, abdominal pain, nausea, melena, diarrhea, constipation, flank pain, dysuria, hematuria, urinary  Frequency, nocturia, numbness, tingling, seizures,  Focal weakness, Loss of consciousness,  Tremor, insomnia, depression, anxiety, and suicidal ideation.     History   Social History  . Marital Status: Married    Spouse Name: N/A    Number of Children: N/A  . Years of Education: N/A   Occupational History  . Not on file.   Social History Main Topics  . Smoking status: Never Smoker   . Smokeless tobacco: Never Used  . Alcohol Use: No  . Drug Use: No  . Sexual Activity: Yes   Other Topics Concern  . Not on file   Social History Narrative  . No narrative on file    Objective:  Filed Vitals:   12/09/13 0910  BP: 112/70  Pulse: 98  Temp: 97.6 F (36.4 C)  Resp: 16     General appearance: alert, cooperative and appears stated age Ears: normal TM's and external ear canals both ears Throat: lips, mucosa, and tongue normal; teeth and gums normal Neck: no adenopathy, no carotid bruit, supple, symmetrical, trachea midline and thyroid not enlarged, symmetric, no tenderness/mass/nodules Back: symmetric, no curvature. ROM normal. No CVA tenderness. Lungs: clear to auscultation bilaterally Heart: regular rate and rhythm, S1, S2 normal, no murmur, click, rub or gallop Abdomen: soft, non-tender; bowel sounds normal; no masses,  no  organomegaly Pulses: 2+ and symmetric Skin: Skin color, texture, turgor normal. No rashes or lesions Lymph nodes: Cervical, supraclavicular, and axillary nodes normal.  Assessment and Plan:  OSA (obstructive sleep apnea) Repeat study has not been done yet secondary to patient having a miserable study due to all the  Electrodes and monitors that were applied.  Discussion of long term complications of untreated OSA .  Ok to postpone CPAP titration study for a few months and continue with weight loss efforts.    Obesity (BMI 30-39.9) I have congratulated him in reduction of wt by 7 lbs in one month and encouraged  Continued weight loss with goal of 10% of body weight over the next 6 months using a low glycemic index diet and regular exercise a minimum of 5 days per week.    Depression Discussion of risks and benefits of anti depressant medication . He will proceed with trial of wellbutrin   Chronic osteomyelitis of right foot Resolved after 3 years of persistent infection, resulting in temporary disability. Marland Kitchen  He is awaiting follow up with [facility] podiatry before re entering employment arena which I agree is reasonable   Other malaise and fatigue Multifactorial,  From depression,  Sleep apnea, and deconditioning,  All have been addressed.   Unspecified vitamin D deficiency He has not taken the megadose of Drisdol which I have recommended he doe for one month,    A total of 40 minutes was spent with patient more than half of which was spent in counseling, reviewing records from other prviders and coordination of care.  Updated Medication List Outpatient Encounter Prescriptions as of 12/09/2013  Medication Sig  . buPROPion (WELLBUTRIN) 100 MG tablet Take 1 tablet (100 mg total) by mouth 2 (two) times daily.  . ergocalciferol (DRISDOL) 50000 UNITS capsule Take 1 capsule (50,000 Units total) by mouth once a week.  Marland Kitchen HYDROcodone-acetaminophen (NORCO) 10-325 MG per tablet Take 1 tablet by mouth every 8 (eight) hours as needed.  . Multiple Vitamins-Minerals (MULTIVITAMIN WITH MINERALS) tablet Take 1 tablet by mouth daily.  . [DISCONTINUED] buPROPion (WELLBUTRIN) 100 MG tablet Take 1 tablet (100 mg total) by mouth 2 (two) times daily.  . [DISCONTINUED] ergocalciferol (DRISDOL) 50000 UNITS capsule Take 1 capsule (50,000 Units total) by mouth once a week.  . [DISCONTINUED] oxyCODONE (OXY IR/ROXICODONE) 5 MG immediate release tablet Take 5 mg by mouth every 6 (six) hours as needed.      No orders of the  defined types were placed in this encounter.    Return in about 3 months (around 03/10/2014).

## 2013-12-09 NOTE — Progress Notes (Signed)
Pre-visit discussion using our clinic review tool. No additional management support is needed unless otherwise documented below in the visit note.  

## 2013-12-09 NOTE — Patient Instructions (Signed)
You are doing great!  We will repeat your fasting labs in 3 months,  Either AT or prior to your next visit  We discussed starting the wellbutrin for your depression  100 mg twice daily,  2nd dose by 2 pm to avoid insomnia   We discussed postponing the CPAP study for nw  Your vitamin D is too low to NOT TREAT.   for one month TAKE the weekly mega dose.

## 2013-12-10 NOTE — Assessment & Plan Note (Signed)
Discussion of risks and benefits of anti depressant medication . He will proceed with trial of wellbutrin

## 2013-12-10 NOTE — Assessment & Plan Note (Signed)
I have congratulated him in reduction of wt by 7 lbs in one month and encouraged  Continued weight loss with goal of 10% of body weight over the next 6 months using a low glycemic index diet and regular exercise a minimum of 5 days per week.

## 2013-12-10 NOTE — Assessment & Plan Note (Signed)
He has not taken the megadose of Drisdol which I have recommended he doe for one month,

## 2013-12-10 NOTE — Assessment & Plan Note (Signed)
Resolved after 3 years of persistent infection, resulting in temporary disability. [name]  He is awaiting follow up with [facility] podiatry before re entering employment arena which I agree is reasonable

## 2013-12-10 NOTE — Assessment & Plan Note (Signed)
Multifactorial,  From depression,  Sleep apnea, and deconditioning,  All have been addressed.

## 2014-01-01 ENCOUNTER — Encounter: Payer: Self-pay | Admitting: Internal Medicine

## 2014-01-01 ENCOUNTER — Ambulatory Visit (INDEPENDENT_AMBULATORY_CARE_PROVIDER_SITE_OTHER): Payer: BC Managed Care – PPO | Admitting: Internal Medicine

## 2014-01-01 VITALS — BP 108/78 | HR 85 | Temp 98.1°F | Resp 16 | Wt 224.8 lb

## 2014-01-01 DIAGNOSIS — N509 Disorder of male genital organs, unspecified: Secondary | ICD-10-CM

## 2014-01-01 DIAGNOSIS — R300 Dysuria: Secondary | ICD-10-CM

## 2014-01-01 DIAGNOSIS — R109 Unspecified abdominal pain: Secondary | ICD-10-CM

## 2014-01-01 DIAGNOSIS — N50812 Left testicular pain: Secondary | ICD-10-CM

## 2014-01-01 DIAGNOSIS — R1032 Left lower quadrant pain: Secondary | ICD-10-CM

## 2014-01-01 LAB — POCT URINALYSIS DIPSTICK
Bilirubin, UA: NEGATIVE
Glucose, UA: NEGATIVE
Ketones, UA: NEGATIVE
Leukocytes, UA: NEGATIVE
Nitrite, UA: NEGATIVE
Protein, UA: NEGATIVE
Spec Grav, UA: 1.025
Urobilinogen, UA: 0.2
pH, UA: 5.5

## 2014-01-01 LAB — URINALYSIS, MICROSCOPIC ONLY
RBC / HPF: NONE SEEN
WBC, UA: NONE SEEN

## 2014-01-01 MED ORDER — MELOXICAM 15 MG PO TABS: 15.0000 mg | ORAL_TABLET | Freq: Every day | ORAL | Status: DC

## 2014-01-01 NOTE — Progress Notes (Signed)
Patient ID: [name], male   DOB: 30-Nov-1965, 48 y.o.   MRN: [id number]   Patient Active Problem List   Diagnosis Date Noted  . Left inguinal pain 01/02/2014  . Depression 11/08/2013  . Unspecified vitamin D deficiency 11/08/2013  . Other malaise and fatigue 11/06/2013  . Routine general medical examination at a health care facility 05/09/2013  . Chronic osteomyelitis of right foot 01/24/2013  . Neuropathy of both feet 01/24/2013  . OSA (obstructive sleep apnea) 01/24/2013  . Obesity (BMI 30-39.9) 01/24/2013  . History of prostatitis 01/24/2013  . Abnormal hemoglobin 01/24/2013    Subjective:  CC:   Chief Complaint  Patient presents with  . Acute Visit    thinks may postate problem or hernia.    HPI:   [name] is a 48 y.o. male who presents for left inguinal and testicular pain for the last two weeks.  Symptoms started the evening after taking a yoga class.   The pain spread to right side a week later despite suspending the yoga classes.   Some burning sensation during voiding , mostly appreciated at the tip of the urethra.  No fevers or back pain    Past Medical History  Diagnosis Date  . Depression   . Allergy     Past Surgical History  Procedure Laterality Date  . Spine surgery  2004    nerve damage in spine  . Foot surgery Right 02/14    cyst removed       The following portions of the patient's history were reviewed and updated as appropriate: Allergies, current medications, and problem list.    Review of Systems:   Patient denies headache, fevers, malaise, unintentional weight loss, skin rash, eye pain, sinus congestion and sinus pain, sore throat, dysphagia,  hemoptysis , cough, dyspnea, wheezing, chest pain, palpitations, orthopnea, edema, abdominal pain, nausea, melena, diarrhea, constipation, flank pain, dysuria, hematuria, urinary  Frequency, nocturia, numbness, tingling, seizures,  Focal weakness, Loss of consciousness,  Tremor, insomnia,  depression, anxiety, and suicidal ideation.     History   Social History  . Marital Status: Married    Spouse Name: N/A    Number of Children: N/A  . Years of Education: N/A   Occupational History  . Not on file.   Social History Main Topics  . Smoking status: Never Smoker   . Smokeless tobacco: Never Used  . Alcohol Use: No  . Drug Use: No  . Sexual Activity: Yes   Other Topics Concern  . Not on file   Social History Narrative  . No narrative on file    Objective:  Filed Vitals:   01/01/14 0805  BP: 108/78  Pulse: 85  Temp: 98.1 F (36.7 C)  Resp: 16  BP 108/78  Pulse 85  Temp(Src) 98.1 F (36.7 C) (Oral)  Resp 16  Wt 224 lb 12 oz (101.946 kg)  SpO2 98%  General Appearance:    Alert, cooperative, no distress, appears stated age  Head:    Normocephalic, without obvious abnormality, atraumatic  Back:     Symmetric, no curvature, ROM normal, no CVA tenderness  Lungs:     Clear to auscultation bilaterally, respirations unlabored  Chest wall:    No tenderness or deformity  Heart:    Regular rate and rhythm, S1 and S2 normal, no murmur, rub   or gallop  Abdomen:     Soft, non-tender, bowel sounds active all four quadrants,    no masses, no organomegaly  Genitalia:    Normal male without lesion, discharge or tenderness  Rectal:    Normal tone, normal prostate, no masses or tenderness;   guaiac negative stool  Extremities:   Extremities normal, atraumatic, no cyanosis or edema  Pulses:   2+ and symmetric all extremities  Skin:   Skin color, texture, turgor normal, no rashes or lesions  Lymph nodes:   Cervical, supraclavicular, and axillary nodes normal  Neurologic:   CNII-XII intact. Normal strength, sensation and reflexes      throughout   Assessment and Plan:  Left inguinal pain Prostate exam was normal. Given its testicular involvement. Ordering scrotal ultrasound to rule out hydrocele and ua.culture to rule out urethritis.    Updated Medication  List Outpatient Encounter Prescriptions as of 01/01/2014  Medication Sig  . buPROPion (WELLBUTRIN) 100 MG tablet Take 1 tablet (100 mg total) by mouth 2 (two) times daily.  . ergocalciferol (DRISDOL) 50000 UNITS capsule Take 1 capsule (50,000 Units total) by mouth once a week.  Marland Kitchen HYDROcodone-acetaminophen (NORCO) 10-325 MG per tablet Take 1 tablet by mouth every 8 (eight) hours as needed.  . meloxicam (MOBIC) 15 MG tablet Take 1 tablet (15 mg total) by mouth daily.  . Multiple Vitamins-Minerals (MULTIVITAMIN WITH MINERALS) tablet Take 1 tablet by mouth daily.     Orders Placed This Encounter  Procedures  . CULTURE, URINE COMPREHENSIVE  . US Scrotum  . Urine Microscopic Only  . POCT Urinalysis Dipstick    No Follow-up on file.

## 2014-01-01 NOTE — Progress Notes (Signed)
Pre-visit discussion using our clinic review tool. No additional management support is needed unless otherwise documented below in the visit note.  

## 2014-01-02 ENCOUNTER — Encounter: Payer: Self-pay | Admitting: Internal Medicine

## 2014-01-02 DIAGNOSIS — R1032 Left lower quadrant pain: Secondary | ICD-10-CM | POA: Insufficient documentation

## 2014-01-02 LAB — CULTURE, URINE COMPREHENSIVE
Colony Count: NO GROWTH
Organism ID, Bacteria: NO GROWTH

## 2014-01-02 NOTE — Assessment & Plan Note (Signed)
Prostate exam was normal. Given its testicular involvement. Ordering scrotal ultrasound to rule out hydrocele and ua.culture to rule out urethritis.

## 2014-01-03 ENCOUNTER — Telehealth: Payer: Self-pay | Admitting: Internal Medicine

## 2014-01-03 NOTE — Telephone Encounter (Signed)
Pt calling to check status of referral

## 2014-01-03 NOTE — Telephone Encounter (Signed)
Left message for pt to call office  Please let him know appointment is @  [facility] entrance Sign in at 1st desk on right Appointment date and time Monday 01/06/14 @ 10 please arrive @ 9:45 i faxed order to [facility] [phone] 5/8 @ 4:06/rbh

## 2014-01-03 NOTE — Telephone Encounter (Signed)
The patient was notified of his appointment.

## 2014-01-06 ENCOUNTER — Ambulatory Visit: Payer: Self-pay | Admitting: Internal Medicine

## 2014-01-09 ENCOUNTER — Telehealth: Payer: Self-pay | Admitting: *Deleted

## 2014-01-09 NOTE — Telephone Encounter (Signed)
Yes , hist testicular ultrasound was all normal.  No masses,  Hydroceles or torsion

## 2014-01-09 NOTE — Telephone Encounter (Signed)
Patient calling to see if results have been received for testicular US. Please advise.

## 2014-01-10 NOTE — Telephone Encounter (Signed)
Patient reports still having symptoms what is the next step, burning in the area , pain on left side of scrotum.

## 2014-01-10 NOTE — Telephone Encounter (Signed)
The pain appears to be referred from his lower back .  If the melosciam  and hydrocodone are not helping,  PT referral.

## 2014-01-10 NOTE — Telephone Encounter (Signed)
Patient notified and declined PT at this time.

## 2014-01-10 NOTE — Telephone Encounter (Signed)
[phone]  Pt called to get his ultrasound results

## 2014-01-10 NOTE — Telephone Encounter (Signed)
Left message for pt to return my call.

## 2014-01-29 ENCOUNTER — Encounter: Payer: Self-pay | Admitting: Internal Medicine

## 2014-02-21 ENCOUNTER — Telehealth: Payer: Self-pay | Admitting: Internal Medicine

## 2014-02-21 NOTE — Telephone Encounter (Signed)
Pt needs to come in today and sign controlled substance agreement, provide urine sample. If he does this, I will fill 7 tabs of Hydrocodone until he can see Dr. Derrel Nip next week. The Hydrocodone was written for testicular pain, however Dr. Derrel Nip indicated in her notes that he might have referred pain from lower back. Has he followed up with PT?

## 2014-02-21 NOTE — Telephone Encounter (Signed)
Left message for patient to return my call.

## 2014-02-21 NOTE — Telephone Encounter (Signed)
Patient taking meloxicam and hydrocodone for back pain and requesting refill on Hydrocodone ok to fill?

## 2014-03-19 ENCOUNTER — Other Ambulatory Visit: Payer: BC Managed Care – PPO

## 2014-03-21 ENCOUNTER — Ambulatory Visit: Payer: BC Managed Care – PPO | Admitting: Internal Medicine

## 2014-03-28 ENCOUNTER — Other Ambulatory Visit (INDEPENDENT_AMBULATORY_CARE_PROVIDER_SITE_OTHER): Payer: BC Managed Care – PPO

## 2014-03-28 DIAGNOSIS — E785 Hyperlipidemia, unspecified: Secondary | ICD-10-CM

## 2014-03-28 DIAGNOSIS — E669 Obesity, unspecified: Secondary | ICD-10-CM

## 2014-03-28 LAB — LIPID PANEL
Cholesterol: 142 mg/dL (ref 0–200)
HDL: 36.2 mg/dL — ABNORMAL LOW (ref >39.00)
LDL Cholesterol: 93 mg/dL (ref 0–99)
NonHDL: 105.8
Total CHOL/HDL Ratio: 4
Triglycerides: 64 mg/dL (ref 0.0–149.0)
VLDL: 12.8 mg/dL (ref 0.0–40.0)

## 2014-03-28 LAB — COMPREHENSIVE METABOLIC PANEL
ALT: 36 U/L (ref 0–53)
AST: 34 U/L (ref 0–37)
Albumin: 4.1 g/dL (ref 3.5–5.2)
Alkaline Phosphatase: 51 U/L (ref 39–117)
BUN: 17 mg/dL (ref 6–23)
CO2: 24 mEq/L (ref 19–32)
Calcium: 8.7 mg/dL (ref 8.4–10.5)
Chloride: 107 mEq/L (ref 96–112)
Creatinine, Ser: 1 mg/dL (ref 0.4–1.5)
GFR: 80.46 mL/min (ref >60.00)
Glucose, Bld: 84 mg/dL (ref 70–99)
Potassium: 4 mEq/L (ref 3.5–5.1)
Sodium: 139 mEq/L (ref 135–145)
Total Bilirubin: 1 mg/dL (ref 0.2–1.2)
Total Protein: 7.6 g/dL (ref 6.0–8.3)

## 2014-03-31 ENCOUNTER — Ambulatory Visit (INDEPENDENT_AMBULATORY_CARE_PROVIDER_SITE_OTHER): Payer: BC Managed Care – PPO | Admitting: Internal Medicine

## 2014-03-31 ENCOUNTER — Ambulatory Visit: Payer: BC Managed Care – PPO | Admitting: Internal Medicine

## 2014-03-31 ENCOUNTER — Encounter: Payer: Self-pay | Admitting: Internal Medicine

## 2014-03-31 VITALS — BP 108/68 | HR 89 | Temp 98.2°F | Resp 16 | Ht 70.0 in | Wt 221.2 lb

## 2014-03-31 DIAGNOSIS — G4733 Obstructive sleep apnea (adult) (pediatric): Secondary | ICD-10-CM

## 2014-03-31 DIAGNOSIS — R5383 Other fatigue: Secondary | ICD-10-CM

## 2014-03-31 DIAGNOSIS — G5793 Unspecified mononeuropathy of bilateral lower limbs: Secondary | ICD-10-CM

## 2014-03-31 DIAGNOSIS — R5381 Other malaise: Secondary | ICD-10-CM

## 2014-03-31 DIAGNOSIS — G609 Hereditary and idiopathic neuropathy, unspecified: Secondary | ICD-10-CM

## 2014-03-31 DIAGNOSIS — E669 Obesity, unspecified: Secondary | ICD-10-CM

## 2014-03-31 LAB — HM DIABETES FOOT EXAM: HM Diabetic Foot Exam: ABNORMAL

## 2014-03-31 MED ORDER — GABAPENTIN 100 MG PO CAPS: 100.0000 mg | ORAL_CAPSULE | Freq: Three times a day (TID) | ORAL | Status: DC

## 2014-03-31 NOTE — Progress Notes (Signed)
Pre-visit discussion using our clinic review tool. No additional management support is needed unless otherwise documented below in the visit note.  

## 2014-03-31 NOTE — Progress Notes (Signed)
Patient ID: [name], male   DOB: 10-26-1965, 48 y.o.   MRN: [id number]  Patient Active Problem List   Diagnosis Date Noted  . Left inguinal pain 01/02/2014  . Depression 11/08/2013  . Unspecified vitamin D deficiency 11/08/2013  . Other malaise and fatigue 11/06/2013  . Routine general medical examination at a health care facility 05/09/2013  . Chronic osteomyelitis of right foot 01/24/2013  . Neuropathy of both feet 01/24/2013  . OSA (obstructive sleep apnea) 01/24/2013  . Obesity (BMI 30-39.9) 01/24/2013  . History of prostatitis 01/24/2013  . Abnormal hemoglobin 01/24/2013    Subjective:  CC:   Chief Complaint  Patient presents with  . Follow-up    Testosterone level    HPI:   [name] is a 48 y.o. male who presents for follow up on multiple chronic issues.  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  He has developed persistent Callous on the MT on  Both feet,  The left one bleeds a lot,  [facility] podiatry following and trims occasionally   Neuropathy has been present for years.  wants to try medication for nocturnal discomfort .  2) Depression:  He did not start wellbutrin  3) Low vit D: he finished  the megadose but did not start taking otc vit D4  4) Urology evaluation for low testosterone  By Dr Eliberto Ivory.  Was encouraged to use testsoterone until hsi lavel rwas vatestosterone level normal  5) Obesity:  He is exercising 3 times weekly,  Has been having trouble sticking to diet   Past Medical History  Diagnosis Date  . Depression   . Allergy     Past Surgical History  Procedure Laterality Date  . Spine surgery  2004    nerve damage in spine  . Foot surgery Right 02/14    cyst removed       The following portions of the patient's history were reviewed and updated as appropriate: Allergies, current medications, and problem list.    Review of Systems:   Patient denies headache, fevers, malaise, unintentional  weight loss, skin rash, eye pain, sinus congestion and sinus pain, sore throat, dysphagia,  hemoptysis , cough, dyspnea, wheezing, chest pain, palpitations, orthopnea, edema, abdominal pain, nausea, melena, diarrhea, constipation, flank pain, dysuria, hematuria, urinary  Frequency, nocturia, numbness, tingling, seizures,  Focal weakness, Loss of consciousness,  Tremor, insomnia, depression, anxiety, and suicidal ideation.     History   Social History  . Marital Status: Married    Spouse Name: N/A    Number of Children: N/A  . Years of Education: N/A   Occupational History  . Not on file.   Social History Main Topics  . Smoking status: Never Smoker   . Smokeless tobacco: Never Used  . Alcohol Use: No  . Drug Use: No  . Sexual Activity: Yes   Other Topics Concern  . Not on file   Social History Narrative  . No narrative on file    Objective:  Filed Vitals:   03/31/14 1552  BP: 108/68  Pulse: 89  Temp: 98.2 F (36.8 C)  Resp: 16     General appearance: alert, cooperative and appears stated age Ears: normal TM's and external ear canals both ears Throat: lips, mucosa, and tongue normal; teeth and gums normal Neck: no adenopathy, no carotid bruit, supple, symmetrical, trachea midline and thyroid not enlarged, symmetric, no tenderness/mass/nodules Back: symmetric, no curvature. ROM normal. No CVA  tenderness. Lungs: clear to auscultation bilaterally Heart: regular rate and rhythm, S1, S2 normal, no murmur, click, rub or gallop Abdomen: soft, non-tender; bowel sounds normal; no masses,  no organomegaly Pulses: 2+ and symmetric Skin: Skin color, texture, turgor normal. No rashes or lesions Lymph nodes: Cervical, supraclavicular, and axillary nodes normal.  Assessment and Plan:  OSA (obstructive sleep apnea) Repeat study has not been done yet secondary to patient having a miserable study due to all the  Electrodes and monitors that were applied.  Discussion of long term  complications of untreated OSA .  Ok to postpone CPAP titration study for a few months and continue with weight loss efforts.     Obesity (BMI 30-39.9) I have addressed  BMI and recommended wt loss of 10% of body weigh over the next 6 months using a low glycemic index diet and regular exercise a minimum of 5 days per week.    Other malaise and fatigue Secondary to deconditioning, untreated OSA>  Encourage to reschedule titration study and lose weight   Neuropathy of both feet Trial of gabapentin for neuropathic pain  A total of 40 minutes was spent with patient more than half of which was spent in counseling, reviewing records from other prviders and coordination of care.   Updated Medication List Outpatient Encounter Prescriptions as of 03/31/2014  Medication Sig  . Multiple Vitamins-Minerals (MULTIVITAMIN WITH MINERALS) tablet Take 1 tablet by mouth daily.  . ergocalciferol (DRISDOL) 50000 UNITS capsule Take 1 capsule (50,000 Units total) by mouth once a week.  . gabapentin (NEURONTIN) 100 MG capsule Take 1 capsule (100 mg total) by mouth 3 (three) times daily.  Marland Kitchen HYDROcodone-acetaminophen (NORCO) 10-325 MG per tablet Take 1 tablet by mouth every 8 (eight) hours as needed.  . meloxicam (MOBIC) 15 MG tablet Take 1 tablet (15 mg total) by mouth daily.  . [DISCONTINUED] buPROPion (WELLBUTRIN) 100 MG tablet Take 1 tablet (100 mg total) by mouth 2 (two) times daily.     No orders of the defined types were placed in this encounter.    Return in about 3 months (around 07/01/2014) for follow up diabetes.

## 2014-03-31 NOTE — Patient Instructions (Signed)
Trial of gabapentin starting with 100 mg at bedtime.  Increased every 4 or 5 days by 100 mg  This is  One version of a  "Low GI"  Diet:  It will still lower your blood sugars and allow you to lose 4 to 8  lbs  per month if you follow it carefully.  Your goal with exercise is a minimum of 30 minutes of aerobic exercise 5 days per week (Walking does not count once it becomes easy!)    All of the foods can be found at grocery stores and in bulk at [employer].  The Atkins protein bars and shakes are available in more varieties at Target, [employer] and [place].     7 AM Breakfast:  Choose from the following:  Low carbohydrate Protein  Shakes (I recommend the EAS AdvantEdge "Carb Control" shakes  Or the low carb shakes by Atkins.    2.5 carbs   Arnold's "Sandwhich Thin"toasted  w/ peanut butter (no jelly: about 20 net carbs  "Bagel Thin" with cream cheese and salmon: about 20 carbs   a scrambled egg/bacon/cheese burrito made with Mission's "carb balance" whole wheat tortilla  (about 10 net carbs )   Avoid cereal and bananas, oatmeal and cream of wheat and grits. They are loaded with carbohydrates!   10 AM: high protein snack  Protein bar by Atkins (the snack size, under 200 cal, usually < 6 net carbs).    A stick of cheese:  Around 1 carb,  100 cal     Dannon Light n Fit [place] Yogurt  (80 cal, 8 carbs)  Other so called "protein bars" and Greek yogurts tend to be loaded with carbohydrates.  Remember, in food advertising, the word "energy" is synonymous for " carbohydrate."  Lunch:   A Sandwich using the bread choices listed, Can use any  Eggs,  lunchmeat, grilled meat or canned tuna), avocado, regular mayo/mustard  and cheese.  A Salad using blue cheese, ranch,  Goddess or vinagrette,  No croutons or "confetti" and no "candied nuts" but regular nuts OK.   No pretzels or chips.  Pickles and miniature sweet peppers are a good low carb alternative that provide a "crunch"  The bread is the only  source of carbohydrate in a sandwich and  can be decreased by trying some of these alternatives to traditional loaf bread  Joseph's makes a pita bread and a flat bread that are 50 cal and 4 net carbs available at [place] and [place].  This can be toasted to use with hummous as well  Toufayan makes a low carb flatbread that's 100 cal and 9 net carbs available at [employer] and [employer]J's makes 2 sizes of  Low carb whole wheat tortilla  (The large one is 210 cal and 6 net carbs) Avoid "Low fat dressings, as well as Barry Brunner and [place] dressings They are loaded with sugar!   3 PM/ Mid day  Snack:  Consider  1 ounce of  almonds, walnuts, pistachios, pecans, peanuts,  Macadamia nuts or a nut medley.  Avoid "granola"; the dried cranberries and raisins are loaded with carbohydrates. Mixed nuts as long as there are no raisins,  cranberries or dried fruit.    Try the prosciutto/mozzarella cheese sticks by Fiorruci  In deli /backery section   High protein      6 PM  Dinner:     Meat/fowl/fish with a green salad, and either broccoli, cauliflower, green beans, spinach, brussel  sprouts or  Lima beans. DO NOT BREAD THE PROTEIN!!      There is a low carb pasta by Dreamfield's that is acceptable and tastes great: only 5 digestible carbs/serving.( All grocery stores but BJs carry it )  Try Hurley Cisco Angelo's chicken piccata or chicken or eggplant parm over low carb pasta.(Lowes and BJs)   Marjory Lies Sanchez's "Carnitas" (pulled pork, no sauce,  0 carbs) or his beef pot roast to make a dinner burrito (at BJ's)  Pesto over low carb pasta (bj's sells a good quality pesto in the center refrigerated section of the deli   Try satueeing  Cheral Marker with mushroooms  Whole wheat pasta is still full of digestible carbs and  Not as low in glycemic index as Dreamfield's.   Brown rice is still rice,  So skip the rice and noodles if you eat [place] or [place] (or at least limit to 1/2 cup)  9 PM snack :   Breyer's "low carb"  fudgsicle or  ice cream bar (Carb Smart line), or  Weight Watcher's ice cream bar , or another "no sugar added" ice cream;  a serving of fresh berries/cherries with whipped cream   Cheese or DANNON'S LlGHT N FIT GREEK YOGURT  8 ounces of [place] unsweetened almond/cococunut milk    Avoid bananas, pineapple, grapes  and watermelon on a regular basis because they are high in sugar.  THINK OF THEM AS DESSERT  Remember that snack Substitutions should be less than 10 NET carbs per serving and meals < 20 carbs. Remember to subtract fiber grams to get the "net carbs."

## 2014-04-01 NOTE — Assessment & Plan Note (Signed)
Trial of gabapentin for neuropathic pain

## 2014-04-01 NOTE — Assessment & Plan Note (Signed)
Repeat study has not been done yet secondary to patient having a miserable study due to all the  Electrodes and monitors that were applied.  Discussion of long term complications of untreated OSA .  Ok to postpone CPAP titration study for a few months and continue with weight loss efforts.

## 2014-04-01 NOTE — Assessment & Plan Note (Signed)
Secondary to deconditioning, untreated OSA>  Encourage to reschedule titration study and lose weight

## 2014-04-01 NOTE — Assessment & Plan Note (Signed)
I have addressed  BMI and recommended wt loss of 10% of body weigh over the next 6 months using a low glycemic index diet and regular exercise a minimum of 5 days per week.   

## 2014-05-27 ENCOUNTER — Telehealth: Payer: Self-pay | Admitting: *Deleted

## 2014-05-27 DIAGNOSIS — E785 Hyperlipidemia, unspecified: Secondary | ICD-10-CM

## 2014-05-27 DIAGNOSIS — R5381 Other malaise: Secondary | ICD-10-CM

## 2014-05-27 DIAGNOSIS — R5383 Other fatigue: Principal | ICD-10-CM

## 2014-05-27 NOTE — Telephone Encounter (Signed)
Pt is coming tomorrow what labs and dx?  

## 2014-05-28 ENCOUNTER — Other Ambulatory Visit: Payer: BC Managed Care – PPO

## 2014-05-30 ENCOUNTER — Other Ambulatory Visit (INDEPENDENT_AMBULATORY_CARE_PROVIDER_SITE_OTHER): Payer: BC Managed Care – PPO

## 2014-05-30 DIAGNOSIS — R5383 Other fatigue: Secondary | ICD-10-CM

## 2014-05-30 DIAGNOSIS — E785 Hyperlipidemia, unspecified: Secondary | ICD-10-CM

## 2014-05-30 DIAGNOSIS — R5381 Other malaise: Secondary | ICD-10-CM

## 2014-05-30 LAB — COMPREHENSIVE METABOLIC PANEL
ALT: 28 U/L (ref 0–53)
AST: 27 U/L (ref 0–37)
Albumin: 4.2 g/dL (ref 3.5–5.2)
Alkaline Phosphatase: 55 U/L (ref 39–117)
BUN: 17 mg/dL (ref 6–23)
CO2: 27 mEq/L (ref 19–32)
Calcium: 8.9 mg/dL (ref 8.4–10.5)
Chloride: 105 mEq/L (ref 96–112)
Creatinine, Ser: 1.1 mg/dL (ref 0.4–1.5)
GFR: 79.52 mL/min (ref >60.00)
Glucose, Bld: 83 mg/dL (ref 70–99)
Potassium: 4 mEq/L (ref 3.5–5.1)
Sodium: 138 mEq/L (ref 135–145)
Total Bilirubin: 1 mg/dL (ref 0.2–1.2)
Total Protein: 8 g/dL (ref 6.0–8.3)

## 2014-05-30 LAB — CBC WITH DIFFERENTIAL/PLATELET
Basophils Absolute: 0.1 10*3/uL (ref 0.0–0.1)
Basophils Relative: 1 % (ref 0.0–3.0)
Eosinophils Absolute: 0.3 10*3/uL (ref 0.0–0.7)
Eosinophils Relative: 3.7 % (ref 0.0–5.0)
HCT: 50.5 % (ref 39.0–52.0)
Hemoglobin: 17.1 g/dL — ABNORMAL HIGH (ref 13.0–17.0)
Lymphocytes Relative: 26.7 % (ref 12.0–46.0)
Lymphs Abs: 2 10*3/uL (ref 0.7–4.0)
MCHC: 33.9 g/dL (ref 30.0–36.0)
MCV: 91.6 fl (ref 78.0–100.0)
Monocytes Absolute: 1 10*3/uL (ref 0.1–1.0)
Monocytes Relative: 12.8 % — ABNORMAL HIGH (ref 3.0–12.0)
Neutro Abs: 4.2 10*3/uL (ref 1.4–7.7)
Neutrophils Relative %: 55.8 % (ref 43.0–77.0)
Platelets: 189 10*3/uL (ref 150.0–400.0)
RBC: 5.51 Mil/uL (ref 4.22–5.81)
RDW: 13.9 % (ref 11.5–15.5)
WBC: 7.4 10*3/uL (ref 4.0–10.5)

## 2014-05-30 LAB — LIPID PANEL
Cholesterol: 145 mg/dL (ref 0–200)
HDL: 31.4 mg/dL — ABNORMAL LOW (ref >39.00)
LDL Cholesterol: 93 mg/dL (ref 0–99)
NonHDL: 113.6
Total CHOL/HDL Ratio: 5
Triglycerides: 104 mg/dL (ref 0.0–149.0)
VLDL: 20.8 mg/dL (ref 0.0–40.0)

## 2014-05-30 LAB — TSH: TSH: 0.72 u[IU]/mL (ref 0.35–4.50)

## 2014-06-02 ENCOUNTER — Encounter: Payer: Self-pay | Admitting: *Deleted

## 2014-07-04 ENCOUNTER — Ambulatory Visit: Payer: BC Managed Care – PPO | Admitting: Internal Medicine

## 2014-12-15 ENCOUNTER — Telehealth: Payer: Self-pay | Admitting: Internal Medicine

## 2014-12-15 NOTE — Telephone Encounter (Signed)
[facility]  Patient Name: [name]  Gender: Male  DOB: 05-Jun-1966   Age: 49 Y 27 M 9 D  Return Phone Number: [phone] (Primary)  Address:   City/State/Zip: [place] [address]   Client [place]  Client Site [facility]  Physician Deborra Medina   Contact Type Call  Call Type Triage / Clinical     Relationship To Patient Self  Appointment Disposition EMR Appointment Attempted - Not Scheduled  Info pasted into Epic Yes  Return Phone Number [phone] (Primary)  Chief Complaint Shoulder Injury  Initial Comment Caller states he is having some burning in his the shoulder region area. Not sure if he pulled a muscle.      PreDisposition Did not know what to do      Nurse Assessment  Nurse: Buck Mam, RN, Trish Date/Time (Eastern Time): 12/15/2014 9:45:00 AM  Confirm and document reason for call. If symptomatic, describe symptoms. ---Patient is caller states he is having some burning in his the shoulder region area. Not sure if he pulled a muscle. The burning is in the left shoulder and one area one exact spot that he can press. Pressure in the chest. Quit taking the gabapintin and this all went away. This was Sat and Sunday. No tightness in chest since Friday.  Has the patient traveled out of the country within the last 30 days? ---No  Does the patient require triage? ---Yes  Related visit to physician within the last 2 weeks? ---No  Does the PT have any chronic conditions? (i.e. diabetes, asthma, etc.) ---Yes  List chronic conditions. ---OSA (obstructive sleep apnea) -8 mo Darrell Mc, MD Overview Mild to moderate sleep apnea Confirmed with sleep study April 2015 Repeat study with CPAP needed Due to difficulty maintaining sleep. 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 -8 mo Darrell Mc, MD  Musculoskeletal and Integument Chronic osteomyelitis of right foot -1 yr Darrell Mc, MD Other Obesity (BMI 30-39.9) -8 mo Darrell Mc, MD History of prostatitis -2 yr Darrell Mc, MD Abnormal hemoglobin -2 yr Darrell Mc, MD Routine general medical examination at a health care facility -2 yr Darrell Mc, MD Other malaise and fatigue -8 mo Darrell Mc, MD Depression -1 yr Darrell Mc, MD Overview Trial of wellbutrin Unspecified vitamin D deficiency -1 yr Darrell Mc, MD Overview Level was 18 November 2013 Drisdol x 1 month Left inguinal pain -1 yr Darrell Mc, MD     Guidelines      Guideline Title Affirmed Question Affirmed Notes Nurse Date/Time Eilene Ghazi Time)  Shoulder Pain [1] Age > 40 AND [2] no obvious cause AND [3] pain even when not moving the arm (Exception: pain is clearly made worse by moving arm or bending neck)  Buck Mam, RN, Trish 12/15/2014 9:48:55 AM   Disp. Time Eilene Ghazi Time) Disposition Final User          12/15/2014 9:51:33 AM Go to ED Now Yes Buck Mam, RN, Particia Lather Understands: Yes  Disagree/Comply: Comply     Care Advice Given Per Guideline      GO TO ED NOW: You need to be seen in the Emergency Department. Go to the ER at ___________ [place] now. Drive carefully. NOTE TO TRIAGER - DRIVING: * Another adult should drive.  CALL EMS IF: The patient passes out, starts acting confused or becomes to weak to stand. BRING MEDICINES: * Please bring a list of your current medicines when you go to the Emergency Department (ER). CARE ADVICE given per Shoulder Pain (Adult) guideline   After Care Instructions Given     Call Event Type User Date / Time Description        Referrals  [facility] - ED

## 2014-12-15 NOTE — Telephone Encounter (Signed)
Patient stated he is on his way to ER .

## 2015-01-16 ENCOUNTER — Encounter: Payer: Self-pay | Admitting: Cardiovascular Disease

## 2015-01-16 ENCOUNTER — Ambulatory Visit (INDEPENDENT_AMBULATORY_CARE_PROVIDER_SITE_OTHER): Payer: BLUE CROSS/BLUE SHIELD | Admitting: Cardiovascular Disease

## 2015-01-16 VITALS — BP 130/78 | HR 78 | Ht 70.0 in | Wt 231.5 lb

## 2015-01-16 DIAGNOSIS — R0789 Other chest pain: Secondary | ICD-10-CM

## 2015-01-16 DIAGNOSIS — E669 Obesity, unspecified: Secondary | ICD-10-CM

## 2015-01-16 DIAGNOSIS — R079 Chest pain, unspecified: Secondary | ICD-10-CM

## 2015-01-16 DIAGNOSIS — G4733 Obstructive sleep apnea (adult) (pediatric): Secondary | ICD-10-CM | POA: Diagnosis not present

## 2015-01-16 DIAGNOSIS — Z0000 Encounter for general adult medical examination without abnormal findings: Secondary | ICD-10-CM

## 2015-01-16 NOTE — Assessment & Plan Note (Signed)
Atypical in nature. Likely musculoskeletal. No further chest pain over the past month. We discussed typical anginal symptoms. Also discussed testing for risk stratification. We have recommended a coronary calcium score given his strong family history of coronary artery disease. He does not have the money at this time. There is no urgency to do this test and could be done in the future and set up over the phone. If he did have calcified coronary plaquing, a low-dose statin would be added

## 2015-01-16 NOTE — Patient Instructions (Signed)
You are doing well. No medication changes were made.  Please call if you have more chest and arm pain concerning for heart pains We would schedule a CT coronary calcium score  Please call us if you have new issues that need to be addressed before your next appt.  Follow up in one year

## 2015-01-16 NOTE — Assessment & Plan Note (Signed)
We spent a long time talking about his family history, risk factors for coronary artery disease. He will continue to exercise for weight loss, try to change his diet again for weight loss Currently cholesterol is excellent on no medication, he is a nonsmoker, nondiabetic CT coronary calcium score if felt appropriate for risk stratification

## 2015-01-16 NOTE — Assessment & Plan Note (Signed)
We have encouraged continued exercise, careful diet management in an effort to lose weight. 

## 2015-01-16 NOTE — Progress Notes (Signed)
Patient ID: [name], male    DOB: 08/03/1966, 49 y.o.   MRN: [id number]  HPI Comments: [name] is a 49 year old gentleman with history of sleep apnea, not on CPAP, prior history of foot surgery, who is currently not working, has had weight gain over the past several years, now working out with his son on a more regular basis doing predominantly weights, who presents for evaluation of shoulder and chest pain.  He reports that one month ago, he had some left chest and shoulder discomfort. Symptoms resolved without intervention. In hindsight, he thinks that he was lifting something heavy or shoveling that may have contributed to his symptoms. He has been back at the gym since then, done heavy lifting with no recurrent episodes. He has done some aerobic but typically lifts more weights and aerobic.  He does report a strong family history of coronary artery disease. He is a nonsmoker, nondiabetic. Total cholesterol 145 on no statin  He reports that he does not use a CPAP as he did not want to go back and do part two of the study. He had a terrible night on part 1.  EKG on today's visit shows normal sinus rhythm with rate 78 bpm, poor R-wave progression to the anterior precordial leads otherwise no significant ST or T-wave changes    No Known Allergies  Current Outpatient Prescriptions on File Prior to Visit  Medication Sig Dispense Refill  . Multiple Vitamins-Minerals (MULTIVITAMIN WITH MINERALS) tablet Take 1 tablet by mouth daily.     No current facility-administered medications on file prior to visit.    Past Medical History  Diagnosis Date  . Depression   . Allergy   . Sleep apnea     Past Surgical History  Procedure Laterality Date  . Spine surgery  2004    nerve damage in spine  . Foot surgery Right 02/14    cyst removed    Social History  reports that he has never smoked. He has never used smokeless tobacco. He reports that he does not drink alcohol or use  illicit drugs.  Family History family history includes Heart attack (age of onset: 48) in his father; Heart disease in his father, maternal grandmother, and paternal grandfather; Stroke in his father, maternal grandmother, and paternal grandfather.  Review of Systems  Constitutional: Negative.   Respiratory: Negative.   Cardiovascular: Negative.   Gastrointestinal: Negative.   Musculoskeletal: Negative.   Skin: Negative.   Neurological: Negative.   Hematological: Negative.   Psychiatric/Behavioral: Negative.   All other systems reviewed and are negative.   BP 130/78 mmHg  Pulse 78  Ht 5\' 10"  (1.778 m)  Wt 231 lb 8 oz (105.008 kg)  BMI 33.22 kg/m2  Physical Exam  Constitutional: He is oriented to person, place, and time. He appears well-developed and well-nourished.  Obese  HENT:  Head: Normocephalic.  Nose: Nose normal.  Mouth/Throat: Oropharynx is clear and moist.  Eyes: Conjunctivae are normal. Pupils are equal, round, and reactive to light.  Neck: Normal range of motion. Neck supple. No JVD present.  Cardiovascular: Normal rate, regular rhythm, S1 normal, S2 normal, normal heart sounds and intact distal pulses.  Exam reveals no gallop and no friction rub.   No murmur heard. Pulmonary/Chest: Effort normal and breath sounds normal. No respiratory distress. He has no wheezes. He has no rales. He exhibits no tenderness.  Abdominal: Soft. Bowel sounds are normal. He exhibits no distension. There is no tenderness.  Musculoskeletal: Normal  range of motion. He exhibits no edema or tenderness.  Lymphadenopathy:    He has no cervical adenopathy.  Neurological: He is alert and oriented to person, place, and time. Coordination normal.  Skin: Skin is warm and dry. No rash noted. No erythema.  Psychiatric: He has a normal mood and affect. His behavior is normal. Judgment and thought content normal.      Assessment and Plan   Nursing note and vitals reviewed.

## 2015-01-16 NOTE — Assessment & Plan Note (Signed)
He reports that he does not want to do phase II as he did not sleep well on phase I. Perhaps he would be a candidate for home auto titration. Some CPAP she is now able to auto titrate without doing phase II. He was interested in this approach

## 2015-07-13 ENCOUNTER — Ambulatory Visit (INDEPENDENT_AMBULATORY_CARE_PROVIDER_SITE_OTHER): Payer: BLUE CROSS/BLUE SHIELD | Admitting: Internal Medicine

## 2015-07-13 ENCOUNTER — Encounter: Payer: Self-pay | Admitting: Internal Medicine

## 2015-07-13 VITALS — BP 110/86 | HR 77 | Temp 97.8°F | Resp 18 | Wt 236.0 lb

## 2015-07-13 DIAGNOSIS — D582 Other hemoglobinopathies: Secondary | ICD-10-CM

## 2015-07-13 DIAGNOSIS — E785 Hyperlipidemia, unspecified: Secondary | ICD-10-CM

## 2015-07-13 DIAGNOSIS — G4733 Obstructive sleep apnea (adult) (pediatric): Secondary | ICD-10-CM | POA: Diagnosis not present

## 2015-07-13 DIAGNOSIS — L97522 Non-pressure chronic ulcer of other part of left foot with fat layer exposed: Secondary | ICD-10-CM

## 2015-07-13 DIAGNOSIS — E559 Vitamin D deficiency, unspecified: Secondary | ICD-10-CM | POA: Diagnosis not present

## 2015-07-13 DIAGNOSIS — R5383 Other fatigue: Secondary | ICD-10-CM

## 2015-07-13 DIAGNOSIS — E669 Obesity, unspecified: Secondary | ICD-10-CM

## 2015-07-13 DIAGNOSIS — Z1159 Encounter for screening for other viral diseases: Secondary | ICD-10-CM

## 2015-07-13 DIAGNOSIS — Z8739 Personal history of other diseases of the musculoskeletal system and connective tissue: Secondary | ICD-10-CM

## 2015-07-13 LAB — COMPREHENSIVE METABOLIC PANEL
ALT: 36 U/L (ref 0–53)
AST: 32 U/L (ref 0–37)
Albumin: 4.2 g/dL (ref 3.5–5.2)
Alkaline Phosphatase: 44 U/L (ref 39–117)
BUN: 13 mg/dL (ref 6–23)
CO2: 24 mEq/L (ref 19–32)
Calcium: 8.9 mg/dL (ref 8.4–10.5)
Chloride: 103 mEq/L (ref 96–112)
Creatinine, Ser: 0.79 mg/dL (ref 0.40–1.50)
GFR: 109.93 mL/min (ref >60.00)
Glucose, Bld: 80 mg/dL (ref 70–99)
Potassium: 4.2 mEq/L (ref 3.5–5.1)
Sodium: 136 mEq/L (ref 135–145)
Total Bilirubin: 0.7 mg/dL (ref 0.2–1.2)
Total Protein: 7.4 g/dL (ref 6.0–8.3)

## 2015-07-13 LAB — CBC WITH DIFFERENTIAL/PLATELET
Basophils Absolute: 0.1 10*3/uL (ref 0.0–0.1)
Basophils Relative: 1.1 % (ref 0.0–3.0)
Eosinophils Absolute: 0.3 10*3/uL (ref 0.0–0.7)
Eosinophils Relative: 3.7 % (ref 0.0–5.0)
HCT: 52.3 % — ABNORMAL HIGH (ref 39.0–52.0)
Hemoglobin: 17.6 g/dL — ABNORMAL HIGH (ref 13.0–17.0)
Lymphocytes Relative: 24.3 % (ref 12.0–46.0)
Lymphs Abs: 2.1 10*3/uL (ref 0.7–4.0)
MCHC: 33.6 g/dL (ref 30.0–36.0)
MCV: 91.6 fl (ref 78.0–100.0)
Monocytes Absolute: 1.1 10*3/uL — ABNORMAL HIGH (ref 0.1–1.0)
Monocytes Relative: 12.8 % — ABNORMAL HIGH (ref 3.0–12.0)
Neutro Abs: 5 10*3/uL (ref 1.4–7.7)
Neutrophils Relative %: 58.1 % (ref 43.0–77.0)
Platelets: 198 10*3/uL (ref 150.0–400.0)
RBC: 5.71 Mil/uL (ref 4.22–5.81)
RDW: 14.2 % (ref 11.5–15.5)
WBC: 8.6 10*3/uL (ref 4.0–10.5)

## 2015-07-13 LAB — LIPID PANEL
Cholesterol: 174 mg/dL (ref 0–200)
HDL: 37.2 mg/dL — ABNORMAL LOW (ref >39.00)
NonHDL: 136.64
Total CHOL/HDL Ratio: 5
Triglycerides: 227 mg/dL — ABNORMAL HIGH (ref 0.0–149.0)
VLDL: 45.4 mg/dL — ABNORMAL HIGH (ref 0.0–40.0)

## 2015-07-13 LAB — TSH: TSH: 1.37 u[IU]/mL (ref 0.35–4.50)

## 2015-07-13 LAB — LDL CHOLESTEROL, DIRECT: Direct LDL: 107 mg/dL

## 2015-07-13 MED ORDER — TRAMADOL HCL 50 MG PO TABS: 50.0000 mg | ORAL_TABLET | Freq: Three times a day (TID) | ORAL | Status: DC | PRN

## 2015-07-13 NOTE — Progress Notes (Signed)
Subjective:  Patient ID: [name], male    DOB: 1966-08-21  Age: 49 y.o. MRN: [id number]  CC: The primary encounter diagnosis was OSA (obstructive sleep apnea). 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  HPI [name] presents for for follow up on chronic issues .  Last seen august 2015 (1.5 years ago) . Interim health events reviewed with patient today and on line reports reviewed as well.   Left foot has recently been treated for pressure ulcer, which occurred on the 5th toe, plantar surface, nonhealing, due to neuropathy.  Now in an offloader ,and taking doxycycline prescribed [facility] podiatrist  [place],  Weekly visits.      Neuropathic Foot Pain.  Prior trial of gabapentin caused heart to race , even with reduction to one dose daily.  Podiatry tried to prescribe a compounded cream but insurance denied it. The pain is keeping him awake at night.  Discussed trial of tramadol.   Saw Dr Rockey Situ  6 months ago for a "49 year old checkup" and had EKG which he was told was normal.  Was advised to treat his OSA and told that there were "wireless masks" that were more tolerable.   Patient has had untreated OSA for years due to mask intolerance . Sleep Med April 2015  Was last study.  Wantst to try the home titration study   Obesity   Has not been able to lower his BMI due to suspension in exercise due to foot problems,  And nonadherence to low GI diet.    HM: overdue for fasting labs.  Needs colon CA screening,  Discussed referral for colonoscopy vs cologuard.  Given his untreated OSA and lack of FH recommended use of Cologuard.    Outpatient Prescriptions Prior to Visit  Medication Sig Dispense Refill  . Multiple Vitamins-Minerals (MULTIVITAMIN WITH  MINERALS) tablet Take 1 tablet by mouth daily.     No facility-administered medications prior to visit.    Review of Systems;  Patient denies headache, fevers, malaise, unintentional weight loss, skin rash, eye pain, sinus congestion and sinus pain, sore throat, dysphagia,  hemoptysis , cough, dyspnea, wheezing, chest pain, palpitations, orthopnea, edema, abdominal pain, nausea, melena, diarrhea, constipation, flank pain, dysuria, hematuria, urinary  Frequency, nocturia, numbness, tingling, seizures,  Focal weakness, Loss of consciousness,  Tremor, insomnia, depression, anxiety, and suicidal ideation.      Objective:  BP 110/86 mmHg  Pulse 77  Temp(Src) 97.8 F (36.6 C)  Resp 18  Wt 236 lb (107.049 kg)  SpO2 98%  BP Readings from Last 3 Encounters:  07/13/15 110/86  01/16/15 130/78  03/31/14 108/68    Wt Readings from Last 3 Encounters:  07/13/15 236 lb (107.049 kg)  01/16/15 231 lb 8 oz (105.008 kg)  03/31/14 221 lb 4 oz (100.358 kg)    General appearance: alert, cooperative and appears stated age Ears: normal TM's and external ear canals both ears Throat: lips, mucosa, and tongue normal; teeth and gums normal Neck: no adenopathy, no carotid bruit, supple, symmetrical, trachea midline and thyroid not enlarged, symmetric, no tenderness/mass/nodules Back: symmetric, no curvature. ROM normal. No CVA tenderness. Lungs: clear to auscultation bilaterally Heart: regular rate and rhythm, S1, S2 normal, no murmur, click, rub or gallop Abdomen: soft, non-tender; bowel sounds normal; no masses,  no organomegaly Pulses: 2+ and symmetric Skin: Skin color, texture, turgor normal. No rashes or lesions Lymph nodes: Cervical, supraclavicular, and axillary nodes normal.  Lab Results  Component Value Date   HGBA1C 5.6 11/06/2013    Lab Results  Component Value Date   CREATININE 0.79 07/13/2015   CREATININE 1.1 05/30/2014   CREATININE 1.0 03/28/2014    Lab Results  Component  Value Date   WBC 8.6 07/13/2015   HGB 17.6* 07/13/2015   HCT 52.3* 07/13/2015   PLT 198.0 07/13/2015   GLUCOSE 80 07/13/2015   CHOL 174 07/13/2015   TRIG 227.0* 07/13/2015   HDL 37.20* 07/13/2015   LDLDIRECT 107.0 07/13/2015   LDLCALC 93 05/30/2014   ALT 36 07/13/2015   AST 32 07/13/2015   NA 136 07/13/2015   K 4.2 07/13/2015   CL 103 07/13/2015   CREATININE 0.79 07/13/2015   BUN 13 07/13/2015   CO2 24 07/13/2015   TSH 1.37 07/13/2015   PSA 0.35 05/08/2013   HGBA1C 5.6 11/06/2013    No results found.  Assessment & Plan:   Problem List Items Addressed This Visit    History of osteomyelitis    Right foot, now resolved, s/p amputation ,  [facility] podiatry        OSA (obstructive sleep apnea) - Primary    autotitrating CPAP machine to be ordered based on results of April 2015 sleep study      Relevant Orders   For home use only DME continuous positive airway pressure (CPAP)   Obesity (BMI 30-39.9)    I have addressed  BMI and recommended wt loss of 10% of body weight over the next 6 months using a low glycemic index diet and regular exercise a minimum of 5 days per week.   Lab Results  Component Value Date   CHOL 174 07/13/2015   HDL 37.20* 07/13/2015   LDLCALC 93 05/30/2014   LDLDIRECT 107.0 07/13/2015   TRIG 227.0* 07/13/2015   CHOLHDL 5 07/13/2015   Lab Results  Component Value Date   TSH 1.37 07/13/2015   Lab Results  Component Value Date   ALT 36 07/13/2015   AST 32 07/13/2015   ALKPHOS 44 07/13/2015   BILITOT 0.7 07/13/2015          Relevant Orders   Comprehensive metabolic panel (Completed)   Abnormal hemoglobin (HCC)    Persistent.  He was screened for iron overload at last visit and has no signs of such. Referral to Hematology advised to rule out PCV.   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   He has weekly visits given history of neuropathy and piror episode of osteomyelitis of right foot requiring eventual ray amputation.        Vitamin D deficiency    Other Visit Diagnoses    Hyperlipidemia        Relevant Orders    Lipid panel (Completed)    LDL cholesterol, direct (Completed)    Need for hepatitis C screening test        Relevant Orders  Hepatitis C antibody (Completed)    Other fatigue        Relevant Orders    TSH (Completed)    CBC with Differential/Platelet (Completed)      A total of 25 minutes of face to face time was spent with patient more than half of which was spent in counselling about the above mentioned conditions  and coordination of care  I am having Mr. Giacomo start on traMADol. I am also having him maintain his multivitamin with minerals and doxycycline.  Meds ordered this encounter  Medications  . doxycycline (DORYX) 100 MG EC tablet    Sig: Take 100 mg by mouth.  . traMADol (ULTRAM) 50 MG tablet    Sig: Take 1 tablet (50 mg total) by mouth every 8 (eight) hours as needed.    Dispense:  90 tablet    Refill:  2    There are no discontinued medications.  Follow-up: No Follow-up on file.   Crecencio Mc, MD

## 2015-07-13 NOTE — Patient Instructions (Signed)
It was good to see you!  We will arrange the home CPAP auto titration for  Your sleep apnea  Return in 6 months for your annual wellness exam

## 2015-07-14 ENCOUNTER — Other Ambulatory Visit: Payer: Self-pay | Admitting: Internal Medicine

## 2015-07-14 DIAGNOSIS — L97512 Non-pressure chronic ulcer of other part of right foot with fat layer exposed: Secondary | ICD-10-CM | POA: Insufficient documentation

## 2015-07-14 LAB — HEPATITIS C ANTIBODY: HCV Ab: NEGATIVE

## 2015-07-14 NOTE — Assessment & Plan Note (Signed)
autotitrating CPAP machine to be ordered based on results of April 2015 sleep study

## 2015-07-14 NOTE — Assessment & Plan Note (Signed)
Right foot, now resolved, s/p amputation ,  [facility] podiatry

## 2015-07-14 NOTE — Assessment & Plan Note (Addendum)
I have addressed  BMI and recommended wt loss of 10% of body weight over the next 6 months using a low glycemic index diet and regular exercise a minimum of 5 days per week.   Lab Results  Component Value Date   CHOL 174 07/13/2015   HDL 37.20* 07/13/2015   LDLCALC 93 05/30/2014   LDLDIRECT 107.0 07/13/2015   TRIG 227.0* 07/13/2015   CHOLHDL 5 07/13/2015   Lab Results  Component Value Date   TSH 1.37 07/13/2015   Lab Results  Component Value Date   ALT 36 07/13/2015   AST 32 07/13/2015   ALKPHOS 44 07/13/2015   BILITOT 0.7 07/13/2015

## 2015-07-14 NOTE — Assessment & Plan Note (Signed)
With recent debridement , oral antibiotic treatment with doxycycline,  And use of off loader by [facility] podiatry./wound care.   He has weekly visits given history of neuropathy and piror episode of osteomyelitis of right foot requiring eventual ray amputation.

## 2015-07-14 NOTE — Assessment & Plan Note (Signed)
Persistent.  He was screened for iron overload at last visit and has no signs of such. Referral to Hematology advised to rule out PCV.   Lab Results  Component Value Date   WBC 8.6 07/13/2015   HGB 17.6* 07/13/2015   HCT 52.3* 07/13/2015   MCV 91.6 07/13/2015   PLT 198.0 07/13/2015

## 2015-07-16 ENCOUNTER — Other Ambulatory Visit: Payer: Self-pay | Admitting: Internal Medicine

## 2015-07-16 DIAGNOSIS — G4733 Obstructive sleep apnea (adult) (pediatric): Secondary | ICD-10-CM

## 2015-07-17 ENCOUNTER — Telehealth: Payer: Self-pay | Admitting: Internal Medicine

## 2015-07-17 ENCOUNTER — Other Ambulatory Visit: Payer: Self-pay

## 2015-07-17 NOTE — Telephone Encounter (Signed)
Pt called about a  Doctor that was supposed to have called him but he's not sure who or what the doctor is and what it was for? Pt would like a call back. Sorry I could not get any other helpful information from him. Thank You!

## 2015-07-21 NOTE — Telephone Encounter (Signed)
No answer on number listed in the chart.

## 2015-07-22 ENCOUNTER — Telehealth: Payer: Self-pay | Admitting: Internal Medicine

## 2015-07-22 NOTE — Telephone Encounter (Signed)
We have sent the prescription to [facility] on 07/14/2015.  They sent a fax needing additional information on the 16th, which was sent on the 17th.  [facility] should be contacting him.

## 2015-07-22 NOTE — Telephone Encounter (Signed)
Pt is calling inquiring about a CPAP machine that he was suppose to be getting. He said that he was told at his last visit with Dr. Derrel Nip that this was discussed d/t his OSA. Please advise.

## 2015-07-31 ENCOUNTER — Ambulatory Visit: Payer: BLUE CROSS/BLUE SHIELD | Admitting: Oncology

## 2015-07-31 ENCOUNTER — Other Ambulatory Visit: Payer: BLUE CROSS/BLUE SHIELD

## 2015-08-04 ENCOUNTER — Other Ambulatory Visit: Payer: Self-pay

## 2015-08-07 ENCOUNTER — Inpatient Hospital Stay: Payer: BLUE CROSS/BLUE SHIELD | Attending: Oncology | Admitting: Oncology

## 2015-08-07 ENCOUNTER — Inpatient Hospital Stay: Payer: BLUE CROSS/BLUE SHIELD

## 2015-08-07 ENCOUNTER — Telehealth: Payer: Self-pay | Admitting: *Deleted

## 2015-08-07 VITALS — BP 139/75 | HR 96 | Temp 98.2°F | Wt 241.8 lb

## 2015-08-07 DIAGNOSIS — Z79899 Other long term (current) drug therapy: Secondary | ICD-10-CM

## 2015-08-07 DIAGNOSIS — D751 Secondary polycythemia: Secondary | ICD-10-CM | POA: Insufficient documentation

## 2015-08-07 DIAGNOSIS — F329 Major depressive disorder, single episode, unspecified: Secondary | ICD-10-CM | POA: Diagnosis not present

## 2015-08-07 DIAGNOSIS — G4730 Sleep apnea, unspecified: Secondary | ICD-10-CM | POA: Insufficient documentation

## 2015-08-07 LAB — CBC
HCT: 49.5 % (ref 40.0–52.0)
Hemoglobin: 17 g/dL (ref 13.0–18.0)
MCH: 30.9 pg (ref 26.0–34.0)
MCHC: 34.3 g/dL (ref 32.0–36.0)
MCV: 90.1 fL (ref 80.0–100.0)
Platelets: 185 10*3/uL (ref 150–440)
RBC: 5.49 MIL/uL (ref 4.40–5.90)
RDW: 14.4 % (ref 11.5–14.5)
WBC: 9.1 10*3/uL (ref 3.8–10.6)

## 2015-08-07 LAB — FERRITIN: Ferritin: 205 ng/mL (ref 24–336)

## 2015-08-07 LAB — IRON AND TIBC
Iron: 76 ug/dL (ref 45–182)
Saturation Ratios: 25 % (ref 17.9–39.5)
TIBC: 311 ug/dL (ref 250–450)
UIBC: 235 ug/dL

## 2015-08-07 NOTE — Telephone Encounter (Signed)
Please call it is up at the front for pick up

## 2015-08-07 NOTE — Telephone Encounter (Signed)
Patient requested a copy of his shot record.Please Advise

## 2015-08-07 NOTE — Progress Notes (Signed)
Patient denies any problems today. 

## 2015-08-10 LAB — CARBON MONOXIDE, BLOOD (PERFORMED AT REF LAB): Carbon Monoxide, Blood: 3.1 % — ABNORMAL HIGH (ref 0.0–1.9)

## 2015-08-12 LAB — JAK2 GENOTYPR

## 2015-08-12 LAB — ERYTHROPOIETIN: Erythropoietin: 14.4 m[IU]/mL (ref 2.6–18.5)

## 2015-08-18 ENCOUNTER — Encounter: Payer: Self-pay | Admitting: Internal Medicine

## 2015-08-28 NOTE — Progress Notes (Signed)
[place]  [phone] [phone]) [phone]  ID: [name] OB: 02-16-1966  MR#: [id number]  [id number]  Patient Care Team: Crecencio Mc, MD as PCP - General (Internal Medicine)  CHIEF COMPLAINT:  Chief Complaint  Patient presents with  . New Evaluation    elevated hemoglobin    INTERVAL HISTORY: Patient is a 49 year old male who was found to have an elevated hemoglobin on routine blood work. He currently feels well and is asymptomatic. He has no neurologic complaints. He denies any recent fevers or illnesses. He has a good appetite and denies weight loss. He has no chest pain or shortness of breath. He denies any nausea, vomiting, constipation, or diarrhea. He has no urinary complaints. Patient feels at his baseline and offers no specific complaints today.  REVIEW OF SYSTEMS:   Review of Systems  Constitutional: Negative.  Negative for fever, weight loss and malaise/fatigue.  Cardiovascular: Negative.  Negative for chest pain.  Gastrointestinal: Negative.   Musculoskeletal: Negative.   Neurological: Negative.     As per HPI. Otherwise, a complete review of systems is negatve.  PAST MEDICAL HISTORY: Past Medical History  Diagnosis Date  . Depression   . Allergy   . Sleep apnea     PAST SURGICAL HISTORY: Past Surgical History  Procedure Laterality Date  . Spine surgery  2004    nerve damage in spine  . Foot surgery Right 02/14    cyst removed    FAMILY HISTORY Family History  Problem Relation Age of Onset  . Heart disease Father   . Stroke Father   . Heart attack Father 82  . Heart disease Maternal Grandmother   . Stroke Maternal Grandmother   . Heart disease Paternal Grandfather   . Stroke Paternal Grandfather        ADVANCED DIRECTIVES:    HEALTH MAINTENANCE: Social History  Substance Use Topics  . Smoking status: Never Smoker   . Smokeless tobacco: Never Used  . Alcohol Use: No      Colonoscopy:  PAP:  Bone density:  Lipid panel:  No Known Allergies  Current Outpatient Prescriptions  Medication Sig Dispense Refill  . Multiple Vitamins-Minerals (MULTIVITAMIN WITH MINERALS) tablet Take 1 tablet by mouth daily.    . traMADol (ULTRAM) 50 MG tablet Take 1 tablet (50 mg total) by mouth every 8 (eight) hours as needed. 90 tablet 2   No current facility-administered medications for this visit.    OBJECTIVE: Filed Vitals:   08/07/15 1102  BP: 139/75  Pulse: 96  Temp: 98.2 F (36.8 C)     Body mass index is 34.7 kg/(m^2).    ECOG FS:0 - Asymptomatic  General: Well-developed, well-nourished, no acute distress. Eyes: Pink conjunctiva, anicteric sclera. HEENT: Normocephalic, moist mucous membranes, clear oropharnyx. Lungs: Clear to auscultation bilaterally. Heart: Regular rate and rhythm. No rubs, murmurs, or gallops. Abdomen: Soft, nontender, nondistended. No organomegaly noted, normoactive bowel sounds. Musculoskeletal: No edema, cyanosis, or clubbing. Neuro: Alert, answering all questions appropriately. Cranial nerves grossly intact. Skin: No rashes or petechiae noted. Psych: Normal affect. Lymphatics: No cervical, calvicular, axillary or inguinal LAD.   LAB RESULTS:  Lab Results  Component Value Date   NA 136 07/13/2015   K 4.2 07/13/2015   CL 103 07/13/2015   CO2 24 07/13/2015   GLUCOSE 80 07/13/2015   BUN 13 07/13/2015   CREATININE 0.79 07/13/2015   CALCIUM 8.9 07/13/2015   PROT 7.4 07/13/2015   ALBUMIN 4.2 07/13/2015   AST  32 07/13/2015   ALT 36 07/13/2015   ALKPHOS 44 07/13/2015   BILITOT 0.7 07/13/2015    Lab Results  Component Value Date   WBC 9.1 08/07/2015   NEUTROABS 5.0 07/13/2015   HGB 17.0 08/07/2015   HCT 49.5 08/07/2015   MCV 90.1 08/07/2015   PLT 185 08/07/2015     STUDIES: No results found.  ASSESSMENT: Polycythemia.  PLAN:    1. Polycythemia: Patient's hemoglobin is 17.0 today which is within normal  limits. He was found to have a mildly elevated carboxyhemoglobin level of 3.1%, which may be mildly contributing. The remainder of his laboratory work, including JAK-2 mutation was either negative or within normal limits.  No intervention is needed at this time. Patient does not require phlebotomy. Return to clinic in 3 months with repeat laboratory work and further evaluation.  Patient expressed understanding and was in agreement with this plan. He also understands that He can call clinic at any time with any questions, concerns, or complaints.     Lloyd Huger, MD   08/28/2015 3:30 PM

## 2015-09-21 ENCOUNTER — Emergency Department: Payer: BLUE CROSS/BLUE SHIELD

## 2015-09-21 ENCOUNTER — Encounter: Payer: Self-pay | Admitting: *Deleted

## 2015-09-21 ENCOUNTER — Emergency Department
Admission: EM | Admit: 2015-09-21 | Discharge: 2015-09-21 | Disposition: A | Discharge status: Home / Self Care | Payer: BLUE CROSS/BLUE SHIELD | Attending: Emergency Medicine | Admitting: Emergency Medicine

## 2015-09-21 DIAGNOSIS — R42 Dizziness and giddiness: Secondary | ICD-10-CM | POA: Diagnosis present

## 2015-09-21 DIAGNOSIS — R110 Nausea: Secondary | ICD-10-CM | POA: Diagnosis not present

## 2015-09-21 DIAGNOSIS — Z79899 Other long term (current) drug therapy: Secondary | ICD-10-CM | POA: Insufficient documentation

## 2015-09-21 DIAGNOSIS — J3489 Other specified disorders of nose and nasal sinuses: Secondary | ICD-10-CM | POA: Insufficient documentation

## 2015-09-21 LAB — COMPREHENSIVE METABOLIC PANEL
ALT: 61 U/L (ref 17–63)
AST: 49 U/L — ABNORMAL HIGH (ref 15–41)
Albumin: 4.2 g/dL (ref 3.5–5.0)
Alkaline Phosphatase: 51 U/L (ref 38–126)
Anion gap: 11 (ref 5–15)
BUN: 13 mg/dL (ref 6–20)
CO2: 22 mmol/L (ref 22–32)
Calcium: 8.9 mg/dL (ref 8.9–10.3)
Chloride: 104 mmol/L (ref 101–111)
Creatinine, Ser: 0.82 mg/dL (ref 0.61–1.24)
GFR calc Af Amer: >60 mL/min (ref >60)
GFR calc non Af Amer: >60 mL/min (ref >60)
Glucose, Bld: 127 mg/dL — ABNORMAL HIGH (ref 65–99)
Potassium: 3.2 mmol/L — ABNORMAL LOW (ref 3.5–5.1)
Sodium: 137 mmol/L (ref 135–145)
Total Bilirubin: 1 mg/dL (ref 0.3–1.2)
Total Protein: 8.2 g/dL — ABNORMAL HIGH (ref 6.5–8.1)

## 2015-09-21 LAB — CBC
HCT: 49.8 % (ref 40.0–52.0)
Hemoglobin: 16.8 g/dL (ref 13.0–18.0)
MCH: 30.8 pg (ref 26.0–34.0)
MCHC: 33.7 g/dL (ref 32.0–36.0)
MCV: 91.6 fL (ref 80.0–100.0)
Platelets: 199 10*3/uL (ref 150–440)
RBC: 5.44 MIL/uL (ref 4.40–5.90)
RDW: 15 % — ABNORMAL HIGH (ref 11.5–14.5)
WBC: 9.8 10*3/uL (ref 3.8–10.6)

## 2015-09-21 LAB — TROPONIN I: Troponin I: <0.03 ng/mL (ref <0.031)

## 2015-09-21 MED ORDER — MECLIZINE HCL 25 MG PO TABS
50.0000 mg | ORAL_TABLET | Freq: Once | ORAL | Status: AC
  Administered 2015-09-21: 50 mg via ORAL
  Filled 2015-09-21: qty 2

## 2015-09-21 MED ORDER — MECLIZINE HCL 25 MG PO TABS: 25.0000 mg | ORAL_TABLET | Freq: Three times a day (TID) | ORAL | Status: DC | PRN

## 2015-09-21 NOTE — ED Notes (Addendum)
Pt to triage via wheelchair.  Pt has dizziness and nausea.  Sx began while sitting in a meeting.  Pt got up and sx got worse.  Pt also reports a frontal headache.  Denies chest pain.  No sob.  Pt alert.  Speech clear

## 2015-09-21 NOTE — ED Provider Notes (Signed)
[facility] Emergency Department Provider Note  Time seen: 9:25 PM  I have reviewed the triage vital signs and the nursing notes.   HISTORY  Chief Complaint Dizziness; Headache; and Nausea    HPI [name] is a 50 y.o. male with a past medical history of depression who presents the emergency department with dizziness. According to the patient approximately 3 hours ago he developed acute onset of dizziness which she described as more of a spinning sensation. He states any time he moved his head, sat up or turned left or right the symptoms worsen significantly. States he had mild sinus pressure in his maxillary sinus area which has been ongoing for the past several days but denies any headache contrary to triage note.Denies any weakness or numbness. Denies slurred speech or confusion. States he's had mild dizziness in the past but nothing lasting 3 hours.    Past Medical History  Diagnosis Date  . Depression   . Allergy   . Sleep apnea     Patient Active Problem List   Diagnosis Date Noted  . 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 Chest pain 01/16/2015  . Depression 11/08/2013  . Vitamin D deficiency 11/08/2013  . Other malaise and fatigue 11/06/2013  . Routine general medical examination at a health care facility 05/09/2013  . History of osteomyelitis 01/24/2013  . 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 OSA (obstructive sleep apnea) 01/24/2013  . Obesity (BMI 30-39.9) 01/24/2013  . History of prostatitis 01/24/2013  . Abnormal hemoglobin ([place]) 01/24/2013    Past Surgical History  Procedure Laterality Date  . Spine surgery  2004    nerve damage in spine  . Foot surgery Right 02/14    cyst removed    Current Outpatient Rx  Name  Route  Sig  Dispense  Refill  . Multiple Vitamins-Minerals (MULTIVITAMIN WITH MINERALS) tablet   Oral   Take 1 tablet by mouth daily.         . traMADol (ULTRAM) 50 MG  tablet   Oral   Take 1 tablet (50 mg total) by mouth every 8 (eight) hours as needed.   90 tablet   2     Allergies Review of patient's allergies indicates no known allergies.  Family History  Problem Relation Age of Onset  . Heart disease Father   . Stroke Father   . Heart attack Father 75  . Heart disease Maternal Grandmother   . Stroke Maternal Grandmother   . Heart disease Paternal Grandfather   . Stroke Paternal Grandfather     Social History Social History  Substance Use Topics  . Smoking status: Never Smoker   . Smokeless tobacco: Never Used  . Alcohol Use: No    Review of Systems Constitutional: Negative for fever Cardiovascular: Negative for chest pain. Respiratory: Negative for shortness of breath. Gastrointestinal: Negative for abdominal pain Neurological: States mild sinus pressure. Denies headache. Denies focal weakness or numbness. 10-point ROS otherwise negative.  ____________________________________________   PHYSICAL EXAM:  VITAL SIGNS: ED Triage Vitals  Enc Vitals Group     BP 09/21/15 1931 145/71 mmHg     Pulse Rate 09/21/15 1931 95     Resp 09/21/15 1931 20     Temp 09/21/15 1931 97.2 F (36.2 C)     Temp Source 09/21/15 1931 Oral     SpO2 09/21/15 1931 99 %     Weight 09/21/15 1931 235 lb (106.595  kg)     Height 09/21/15 1931 5\' 10"  (1.778 m)     Head Cir --      Peak Flow --      Pain Score 09/21/15 2034 0     Pain Loc --      Pain Edu? --      Excl. in [place]? --     Constitutional: Alert and oriented. Well appearing and in no distress. Eyes: Normal exam, no nystagmus noted. ENT   Head: Normocephalic and atraumatic   Mouth/Throat: Mucous membranes are moist. Panic membranes, normal ear exam. Cardiovascular: Normal rate, regular rhythm. No murmur Respiratory: Normal respiratory effort without tachypnea nor retractions. Breath sounds are clear and equal bilaterally. No wheezes/rales/rhonchi. Gastrointestinal: Soft and  nontender. No distention.   Musculoskeletal: Nontender with normal range of motion in all extremities.  Neurologic:  Normal speech and language. No gross focal neurologic deficits. Equal grip strength bilaterally. No pronator drift. Intact finger to nose testing. 5/5 motor in all extremities. Cranial nerves intact. Skin:  Skin is warm, dry and intact.  Psychiatric: Mood and affect are normal. Speech and behavior are normal.  ____________________________________________    EKG  EKG reviewed and interpreted by myself shows normal sinus rhythm at 79 bpm, narrow QRS, normal axis, normal intervals, nonspecific but no concerning ST changes.  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 No acute abnormality.  ____________________________________________   INITIAL IMPRESSION / ASSESSMENT AND PLAN / ED COURSE  Pertinent labs & imaging results that were available during my care of the patient were reviewed by me and considered in my medical decision making (see chart for details).  Patient presents the emergency department with dizziness and nausea. States the dizziness is much worse if he attempts to sit up or change head position. Describes a spinning sensation. His symptoms strongly resemble vertigo. Neurologically the patient is intact, good finger to nose/cerebellar testing. We'll obtain a CT head and dose meclizine, close monitoring the emergency department and reassess shortly.   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 Patient's labs are within normal limits. Patient states after the meclizine he is feeling much better, was able to get up and walk to the restroom by himself. States he does feel a little bit dizzy but nothing like before. I discussed the CT findings with the patient, he states he is aware of the encephalomalacia, states he was told about a possible old stroke nearly 15 years ago  when he had another CT scan done. I discussed with the patient and he states he has never had this worked up he has never had an MRI. Again I believe his symptoms are much more consistent with vertigo, and they are improving with meclizine. However I feel the patient would benefit from a neurology evaluation, and he agrees to follow up with neurology as an outpatient for further evaluation. Patient will be discharged home with meclizine. I discussed strict return precautions for any worsening symptoms, weakness/numbness, slurred speech. The patient is agreeable.  ____________________________________________   FINAL CLINICAL IMPRESSION(S) / ED DIAGNOSES  Dizziness Vertigo  Harvest Dark, MD 09/21/15 [phone]

## 2015-09-21 NOTE — Discharge Instructions (Signed)
Please call the number provided to arrange a neurology and ENT follow-up as soon as possible. Return to the emergency department for any worsening dizziness, confusion, slurred speech, weakness or numbness of any arm or leg, or any other symptom personally concerning to your self.    Dizziness Dizziness is a common problem. It is a feeling of unsteadiness or light-headedness. You may feel like you are about to faint. Dizziness can lead to injury if you stumble or fall. Anyone can become dizzy, but dizziness is more common in older adults. This condition can be caused by a number of things, including medicines, dehydration, or illness. HOME CARE INSTRUCTIONS Taking these steps may help with your condition: Eating and Drinking  Drink enough fluid to keep your urine clear or pale yellow. This helps to keep you from becoming dehydrated. Try to drink more clear fluids, such as water.  Do not drink alcohol.  Limit your caffeine intake if directed by your health care provider.  Limit your salt intake if directed by your health care provider. Activity  Avoid making quick movements.  Rise slowly from chairs and steady yourself until you feel okay.  In the morning, first sit up on the side of the bed. When you feel okay, stand slowly while you hold onto something until you know that your balance is fine.  Move your legs often if you need to stand in one place for a long time. Tighten and relax your muscles in your legs while you are standing.  Do not drive or operate heavy machinery if you feel dizzy.  Avoid bending down if you feel dizzy. Place items in your home so that they are easy for you to reach without leaning over. Lifestyle  Do not use any tobacco products, including cigarettes, chewing tobacco, or electronic cigarettes. If you need help quitting, ask your health care provider.  Try to reduce your stress level, such as with yoga or meditation. Talk with your health care provider if  you need help. General Instructions  Watch your dizziness for any changes.  Take medicines only as directed by your health care provider. Talk with your health care provider if you think that your dizziness is caused by a medicine that you are taking.  Tell a friend or a family member that you are feeling dizzy. If he or she notices any changes in your behavior, have this person call your health care provider.  Keep all follow-up visits as directed by your health care provider. This is important. SEEK MEDICAL CARE IF:  Your dizziness does not go away.  Your dizziness or light-headedness gets worse.  You feel nauseous.  You have reduced hearing.  You have new symptoms.  You are unsteady on your feet or you feel like the room is spinning. SEEK IMMEDIATE MEDICAL CARE IF:  You vomit or have diarrhea and are unable to eat or drink anything.  You have problems talking, walking, swallowing, or using your arms, hands, or legs.  You feel generally weak.  You are not thinking clearly or you have trouble forming sentences. It may take a friend or family member to notice this.  You have chest pain, abdominal pain, shortness of breath, or sweating.  Your vision changes.  You notice any bleeding.  You have a headache.  You have neck pain or a stiff neck.  You have a fever.   This information is not intended to replace advice given to you by your health care provider. Make sure  you discuss any questions you have with your health care provider.   Document Released: 02/08/2001 Document Revised: 12/30/2014 Document Reviewed: 08/11/2014 Elsevier Interactive Patient Education 2016 [employer].  Vertigo Vertigo means that you feel like you are moving when you are not. Vertigo can also make you feel like things around you are moving when they are not. This feeling can come and go at any time. Vertigo often goes away on its own. HOME CARE  Avoid making fast movements.  Avoid  driving.  Avoid using heavy machinery.  Avoid doing any task or activity that might cause danger to you or other people if you would have a vertigo attack while you are doing it.  Sit down right away if you feel dizzy or have trouble with your balance.  Take over-the-counter and prescription medicines only as told by your doctor.  Follow instructions from your doctor about which positions or movements you should avoid.  Drink enough fluid to keep your pee (urine) clear or pale yellow.  Keep all follow-up visits as told by your doctor. This is important. GET HELP IF:  Medicine does not help your vertigo.  You have a fever.  Your problems get worse or you have new symptoms.  Your family or friends see changes in your behavior.  You feel sick to your stomach (nauseous) or you throw up (vomit).  You have a "pins and needles" feeling or you are numb in part of your body. GET HELP RIGHT AWAY IF:  You have trouble moving or talking.  You are always dizzy.  You pass out (faint).  You get very bad headaches.  You feel weak or have trouble using your hands, arms, or legs.  You have changes in your hearing.  You have changes in your seeing (vision).  You get a stiff neck.  Bright light starts to bother you.   This information is not intended to replace advice given to you by your health care provider. Make sure you discuss any questions you have with your health care provider.   Document Released: 05/24/2008 Document Revised: 05/06/2015 Document Reviewed: 12/08/2014 Elsevier Interactive Patient Education [employer].

## 2015-09-21 NOTE — ED Notes (Signed)
Pt presents to ED with c/o nausea and dizziness that started around 615pm this evening. Pt reports he also felt "cold, and my neck felt stiff in the back". Pt reports the nausea and dizziness has been constant, but denies any vomiting, pain, visual changes, or weakness in his extremities.

## 2015-11-03 ENCOUNTER — Other Ambulatory Visit: Payer: Self-pay | Admitting: *Deleted

## 2015-11-03 DIAGNOSIS — D45 Polycythemia vera: Secondary | ICD-10-CM

## 2015-11-06 ENCOUNTER — Ambulatory Visit: Payer: BLUE CROSS/BLUE SHIELD | Admitting: Oncology

## 2015-11-06 ENCOUNTER — Other Ambulatory Visit: Payer: BLUE CROSS/BLUE SHIELD

## 2015-11-12 ENCOUNTER — Encounter: Payer: BLUE CROSS/BLUE SHIELD | Admitting: Internal Medicine

## 2015-12-04 ENCOUNTER — Ambulatory Visit (INDEPENDENT_AMBULATORY_CARE_PROVIDER_SITE_OTHER): Payer: BLUE CROSS/BLUE SHIELD | Admitting: Internal Medicine

## 2015-12-04 VITALS — BP 118/70 | HR 90 | Temp 98.5°F | Resp 12 | Ht 70.0 in | Wt 236.5 lb

## 2015-12-04 DIAGNOSIS — L97522 Non-pressure chronic ulcer of other part of left foot with fat layer exposed: Secondary | ICD-10-CM | POA: Diagnosis not present

## 2015-12-04 DIAGNOSIS — Z7289 Other problems related to lifestyle: Secondary | ICD-10-CM | POA: Diagnosis not present

## 2015-12-04 DIAGNOSIS — E559 Vitamin D deficiency, unspecified: Secondary | ICD-10-CM | POA: Diagnosis not present

## 2015-12-04 DIAGNOSIS — J209 Acute bronchitis, unspecified: Secondary | ICD-10-CM

## 2015-12-04 DIAGNOSIS — G4733 Obstructive sleep apnea (adult) (pediatric): Secondary | ICD-10-CM | POA: Diagnosis not present

## 2015-12-04 DIAGNOSIS — L8994 Pressure ulcer of unspecified site, stage 4: Secondary | ICD-10-CM

## 2015-12-04 DIAGNOSIS — R635 Abnormal weight gain: Secondary | ICD-10-CM

## 2015-12-04 DIAGNOSIS — Z0000 Encounter for general adult medical examination without abnormal findings: Secondary | ICD-10-CM

## 2015-12-04 DIAGNOSIS — E785 Hyperlipidemia, unspecified: Secondary | ICD-10-CM | POA: Diagnosis not present

## 2015-12-04 DIAGNOSIS — G5793 Unspecified mononeuropathy of bilateral lower limbs: Secondary | ICD-10-CM

## 2015-12-04 LAB — SEDIMENTATION RATE: Sed Rate: 17 mm/hr (ref 0–22)

## 2015-12-04 LAB — C-REACTIVE PROTEIN: CRP: 1.9 mg/dL (ref 0.5–20.0)

## 2015-12-04 LAB — VITAMIN D 25 HYDROXY (VIT D DEFICIENCY, FRACTURES): VITD: 16.04 ng/mL — ABNORMAL LOW (ref 30.00–100.00)

## 2015-12-04 LAB — COMPREHENSIVE METABOLIC PANEL
ALT: 37 U/L (ref 0–53)
AST: 32 U/L (ref 0–37)
Albumin: 4.1 g/dL (ref 3.5–5.2)
Alkaline Phosphatase: 51 U/L (ref 39–117)
BUN: 11 mg/dL (ref 6–23)
CO2: 30 mEq/L (ref 19–32)
Calcium: 9 mg/dL (ref 8.4–10.5)
Chloride: 99 mEq/L (ref 96–112)
Creatinine, Ser: 0.86 mg/dL (ref 0.40–1.50)
GFR: 99.52 mL/min (ref >60.00)
Glucose, Bld: 83 mg/dL (ref 70–99)
Potassium: 4 mEq/L (ref 3.5–5.1)
Sodium: 137 mEq/L (ref 135–145)
Total Bilirubin: 0.7 mg/dL (ref 0.2–1.2)
Total Protein: 7.6 g/dL (ref 6.0–8.3)

## 2015-12-04 LAB — LDL CHOLESTEROL, DIRECT: Direct LDL: 104 mg/dL

## 2015-12-04 LAB — HEPATITIS C ANTIBODY: HCV Ab: NEGATIVE

## 2015-12-04 LAB — HIV ANTIBODY (ROUTINE TESTING W REFLEX): HIV 1&2 Ab, 4th Generation: NONREACTIVE

## 2015-12-04 LAB — TSH: TSH: 0.78 u[IU]/mL (ref 0.35–4.50)

## 2015-12-04 MED ORDER — BENZONATATE 100 MG PO CAPS: 100.0000 mg | ORAL_CAPSULE | Freq: Two times a day (BID) | ORAL | Status: DC | PRN

## 2015-12-04 MED ORDER — PREDNISONE 10 MG PO TABS: ORAL_TABLET | ORAL | Status: DC

## 2015-12-04 NOTE — Patient Instructions (Addendum)
I am referring you to [place] to get your wounds treated BEFORE you lose any more toes.  I am treating your cough  With a cough suppressant and a prednisone taper.  You should ALSO take 25 mg diphenhydramine at night, along with 2 shorts of Afrin spray (one in each nostril) at bedtime only.  Continue sudafed PE every 6 hours until evening.  PLEASE consider getting the CPAP study once you  have met your deductible    NON fasting labs were done today including a PSA to screen for prostate cancer    Please check to see if your insurance will pay for cologuard in lieu of colonoscopy for colon CA screening   Health Maintenance, Male A healthy lifestyle and preventative care can promote health and wellness.  Maintain regular health, dental, and eye exams.  Eat a healthy diet. Foods like vegetables, fruits, whole grains, low-fat dairy products, and lean protein foods contain the nutrients you need and are low in calories. Decrease your intake of foods high in solid fats, added sugars, and salt. Get information about a proper diet from your health care provider, if necessary.  Regular physical exercise is one of the most important things you can do for your health. Most adults should get at least 150 minutes of moderate-intensity exercise (any activity that increases your heart rate and causes you to sweat) each week. In addition, most adults need muscle-strengthening exercises on 2 or more days a week.   Maintain a healthy weight. The body mass index (BMI) is a screening tool to identify possible weight problems. It provides an estimate of body fat based on height and weight. Your health care provider can find your BMI and can help you achieve or maintain a healthy weight. For males 20 years and older:  A BMI below 18.5 is considered underweight.  A BMI of 18.5 to 24.9 is normal.  A BMI of 25 to 29.9 is considered overweight.  A BMI of 30 and above is considered  obese.  Maintain normal blood lipids and cholesterol by exercising and minimizing your intake of saturated fat. Eat a balanced diet with plenty of fruits and vegetables. Blood tests for lipids and cholesterol should begin at age 10 and be repeated every 5 years. If your lipid or cholesterol levels are high, you are over age 66, or you are at high risk for heart disease, you may need your cholesterol levels checked more frequently.Ongoing high lipid and cholesterol levels should be treated with medicines if diet and exercise are not working.  If you smoke, find out from your health care provider how to quit. If you do not use tobacco, do not start.  Lung cancer screening is recommended for adults aged 52-80 years who are at high risk for developing lung cancer because of a history of smoking. A yearly low-dose CT scan of the lungs is recommended for people who have at least a 30-pack-year history of smoking and are current smokers or have quit within the past 15 years. A pack year of smoking is smoking an average of 1 pack of cigarettes a day for 1 year (for example, a 30-pack-year history of smoking could mean smoking 1 pack a day for 30 years or 2 packs a day for 15 years). Yearly screening should continue until the smoker has stopped smoking for at least 15 years. Yearly screening should be stopped for people who develop a health problem that would prevent them from having  lung cancer treatment.  If you choose to drink alcohol, do not have more than 2 drinks per day. One drink is considered to be 12 oz (360 mL) of beer, 5 oz (150 mL) of wine, or 1.5 oz (45 mL) of liquor.  Avoid the use of street drugs. Do not share needles with anyone. Ask for help if you need support or instructions about stopping the use of drugs.  High blood pressure causes heart disease and increases the risk of stroke. High blood pressure is more likely to develop in:  People who have blood pressure in the end of the normal  range (100-139/85-89 mm Hg).  People who are overweight or obese.  People who are African American.  If you are 72-48 years of age, have your blood pressure checked every 3-5 years. If you are 37 years of age or older, have your blood pressure checked every year. You should have your blood pressure measured twice--once when you are at a hospital or clinic, and once when you are not at a hospital or clinic. Record the average of the two measurements. To check your blood pressure when you are not at a hospital or clinic, you can use:  An automated blood pressure machine at a pharmacy.  A home blood pressure monitor.  If you are 76-19 years old, ask your health care provider if you should take aspirin to prevent heart disease.  Diabetes screening involves taking a blood sample to check your fasting blood sugar level. This should be done once every 3 years after age 29 if you are at a normal weight and without risk factors for diabetes. Testing should be considered at a younger age or be carried out more frequently if you are overweight and have at least 1 risk factor for diabetes.  Colorectal cancer can be detected and often prevented. Most routine colorectal cancer screening begins at the age of 53 and continues through age 17. However, your health care provider may recommend screening at an earlier age if you have risk factors for colon cancer. On a yearly basis, your health care provider may provide home test kits to check for hidden blood in the stool. A small camera at the end of a tube may be used to directly examine the colon (sigmoidoscopy or colonoscopy) to detect the earliest forms of colorectal cancer. Talk to your health care provider about this at age 37 when routine screening begins. A direct exam of the colon should be repeated every 5-10 years through age 17, unless early forms of precancerous polyps or small growths are found.  People who are at an increased risk for hepatitis B  should be screened for this virus. You are considered at high risk for hepatitis B if:  You were born in a country where hepatitis B occurs often. Talk with your health care provider about which countries are considered high risk.  Your parents were born in a high-risk country and you have not received a shot to protect against hepatitis B (hepatitis B vaccine).  You have HIV or AIDS.  You use needles to inject street drugs.  You live with, or have sex with, someone who has hepatitis B.  You are a man who has sex with other men (MSM).  You get hemodialysis treatment.  You take certain medicines for conditions like cancer, organ transplantation, and autoimmune conditions.  Hepatitis C blood testing is recommended for all people born from [birth year] through 1967 and any individual with known risk  factors for hepatitis C.  Healthy men should no longer receive prostate-specific antigen (PSA) blood tests as part of routine cancer screening. Talk to your health care provider about prostate cancer screening.  Testicular cancer screening is not recommended for adolescents or adult males who have no symptoms. Screening includes self-exam, a health care provider exam, and other screening tests. Consult with your health care provider about any symptoms you have or any concerns you have about testicular cancer.  Practice safe sex. Use condoms and avoid high-risk sexual practices to reduce the spread of sexually transmitted infections (STIs).  You should be screened for STIs, including gonorrhea and chlamydia if:  You are sexually active and are younger than 24 years.  You are older than 24 years, and your health care provider tells you that you are at risk for this type of infection.  Your sexual activity has changed since you were last screened, and you are at an increased risk for chlamydia or gonorrhea. Ask your health care provider if you are at risk.  If you are at risk of being infected with  HIV, it is recommended that you take a prescription medicine daily to prevent HIV infection. This is called pre-exposure prophylaxis (PrEP). You are considered at risk if:  You are a man who has sex with other men (MSM).  You are a heterosexual man who is sexually active with multiple partners.  You take drugs by injection.  You are sexually active with a partner who has HIV.  Talk with your health care provider about whether you are at high risk of being infected with HIV. If you choose to begin PrEP, you should first be tested for HIV. You should then be tested every 3 months for as long as you are taking PrEP.  Use sunscreen. Apply sunscreen liberally and repeatedly throughout the day. You should seek shade when your shadow is shorter than you. Protect yourself by wearing long sleeves, pants, a wide-brimmed hat, and sunglasses year round whenever you are outdoors.  Tell your health care provider of new moles or changes in moles, especially if there is a change in shape or color. Also, tell your health care provider if a mole is larger than the size of a pencil eraser.  A one-time screening for abdominal aortic aneurysm (AAA) and surgical repair of large AAAs by ultrasound is recommended for men aged 85-75 years who are current or former smokers.  Stay current with your vaccines (immunizations).   This information is not intended to replace advice given to you by your health care provider. Make sure you discuss any questions you have with your health care provider.   Document Released: 02/11/2008 Document Revised: 09/05/2014 Document Reviewed: 01/10/2011 Elsevier Interactive Patient Education [employer].

## 2015-12-04 NOTE — Progress Notes (Signed)
Pre-visit discussion using our clinic review tool. No additional management support is needed unless otherwise documented below in the visit note.  

## 2015-12-04 NOTE — Progress Notes (Signed)
Patient ID: [name], male    DOB: 10-19-1965  Age: 50 y.o. MRN: [id number]  The patient is here for annual  wellness examination and management of other chronic and acute problems.   Seen in ED on Jan 23 for  Sudden onset of positional vertigo in the setting of recent maxillary sinus pressure .  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  He was given meclizine with  Immediate resolution and he was neurologically intact.   Has been feeling poorly lately, dry cough.  Treated at a walk in clinic with augmentin (and was advised to take a probiotic) for a sinus infection a month ago, which helped resolve symptoms for about 3 weeks.  Then dry cough returned,  No sneezing,  Right eye itching.  Some rhinitis in the morning.  Used mucinex  Only, sudafed PE which has helped.  Still waking up at night by a paroxysmal dry cough, accompanied by shortness of breath , .  Chest feels tight but he is not short of breath with normal activities.No fevers or night sweats..   No body aches.  Received flu vaccine. Tired due to disrupted sleep.   Not treating OSA due to out of pocket cost for CPAP.  Had the initial screening test in 2015  But he refused to have the titration study .     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  He has not had wound care since December due ot personal    The risk factors are reflected in the social history.  The roster of all physicians providing medical care to patient - is listed in the Snapshot section of the chart.  Activities of daily living:  The patient is 100% independent in all ADLs: dressing, toileting, feeding as well as independent mobility  Home safety : The patient has smoke detectors in the home. They wear seatbelts.  There are no firearms at home. There is no violence in the home.   There is no risks for hepatitis, STDs  or HIV. There is no   history of blood transfusion. They have no travel history to infectious disease endemic areas of the world.  The patient has seen their dentist in the last six month. They have seen their eye doctor in the last year. They admit to slight hearing difficulty with regard to whispered voices and some television programs.  They have deferred audiologic testing in the last year.  They do not  have excessive sun exposure. Discussed the need for sun protection: hats, long sleeves and use of sunscreen if there is significant sun exposure.   Diet: the importance of a healthy diet is discussed. They do have a healthy diet.  The benefits of regular aerobic exercise were discussed.  Several times a week but is limited byb his bilateral peripheral  neuropathy and ulcers  On both feet.  He is not interested in going to [facility] again  He goes to [employer] She walks 4 times per week ,  20 minutes.   Depression screen: there are no signs or vegative symptoms of depression- irritability, change in appetite, anhedonia, sadness/tearfullness.  Cognitive assessment: the patient manages all their financial and personal affairs and is actively engaged. They could relate day,date,year and events; recalled 2/3 objects at 3 minutes; performed clock-face test normally.  The following portions of the  patient's history were reviewed and updated as appropriate: allergies, current medications, past family history, past medical history,  past surgical history, past social history  and problem list.  Visual acuity was not assessed per patient preference since she has regular follow up with her ophthalmologist. Hearing and body mass index were assessed and reviewed.   During the course of the visit the patient was educated and counseled about appropriate screening and preventive services including : fall prevention , diabetes screening, nutrition counseling, colorectal cancer screening, and recommended  immunizations.    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     History Amour has a past medical history of Depression; Allergy; and Sleep apnea.   He has past surgical history that includes Spine surgery (2004) and Foot surgery (Right, 02/14).   His family history includes Heart attack (age of onset: 33) in his father; Heart disease in his father, maternal grandmother, and paternal grandfather; Stroke in his father, maternal grandmother, and paternal grandfather.He reports that he has never smoked. He has never used smokeless tobacco. He reports that he does not drink alcohol or use illicit drugs.  Outpatient Prescriptions Prior to Visit  Medication Sig Dispense Refill  . Multiple Vitamins-Minerals (MULTIVITAMIN WITH MINERALS) tablet Take 1 tablet by mouth daily.    . traMADol (ULTRAM) 50 MG tablet Take 1 tablet (50 mg total) by mouth every 8 (eight) hours as needed. 90 tablet 2  . meclizine (ANTIVERT) 25 MG tablet Take 1 tablet (25 mg total) by mouth 3 (three) times daily as needed for dizziness. (Patient not taking: Reported on 12/04/2015) 30 tablet 0   No facility-administered medications prior to visit.    Review of Systems  Patient denies headache, fevers, unintentional weight loss, skin rash, eye pain, sinus congestion and sinus pain, sore throat, dysphagia,  hemoptysis , cough, dyspnea, wheezing, chest pain, palpitations, orthopnea, edema, abdominal pain, nausea, melena, diarrhea, constipation, flank pain, dysuria, hematuria, urinary  Frequency, nocturia, numbness, tingling, seizures,  Focal  weakness, Loss of consciousness,  Tremor, insomnia, depression, anxiety, and suicidal ideation.     Objective:  BP 118/70 mmHg  Pulse 90  Temp(Src) 98.5 F (36.9 C) (Oral)  Resp 12  Ht 5\' 10"  (1.778 m)  Wt 236 lb 8 oz (107.276 kg)  BMI 33.93 kg/m2  SpO2 98%  Physical Exam   General appearance: alert, cooperative and appears stated age Head: Normocephalic, without obvious abnormality, atraumatic Eyes: conjunctivae/corneas clear. PERRL, EOM's intact. Fundi benign. Ears: normal TM's and external ear canals both ears Nose: Nares normal. Septum midline. Mucosa normal. No drainage or sinus tenderness. Throat: lips, mucosa, and tongue normal; teeth and gums normal Neck: no adenopathy, no carotid bruit, no JVD, supple, symmetrical, trachea midline and thyroid not enlarged, symmetric, no tenderness/mass/nodules Lungs: clear to auscultation bilaterally Breasts: normal appearance, no masses or tenderness Heart: regular rate and rhythm, S1, S2 normal, no murmur, click, rub or gallop Abdomen: soft, non-tender; bowel sounds normal; no masses,  no organomegaly Extremities: extremities normal, atraumatic, no cyanosis or edema Pulses: 2+ and symmetric Skin: Right MT amputation ;  Stage 3 pressure ulcer  Of plantar surface of fore foot with significant eschar and surrounding  callous.   Left forefoot with Stage 4 (bone exposed ) ulcer over 4th MT plantar suface Skin color, texture, turgor normal. No rashes or lesions Neurologic: Alert and oriented X 3, normal strength and tone. Normal symmetric reflexes. Normal coordination and gait.     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 With recurrent bilateral pressure ulcer formation       OSA (obstructive sleep apnea)    Untreated due to patient deferring titration study.  Advised strongly to have this done.  Reordered.       Routine general medical examination at a health care  facility    Annual comprehensive preventive exam was done as well as an evaluation and management of acute and chronic conditions .  During the course of the visit the patient was educated and counseled about appropriate screening and preventive services including :  diabetes screening, lipid analysis with projected  10 year  risk for CAD , nutrition counseling, prostate and colorectal cancer screening, and recommended immunizations.  Printed recommendations for health maintenance screenings was given.       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  Referral to wound [facility].       Acute bronchitis    I am prescribing a prednisone taper, attime cough.  Use the old robitussin or Delsym OTC for daytime cough You should use either sudafed PE/afrin for nasal congestion or Afrin nasal spray twice daily for 5 days for the ear and sinus congestion I also recommending using Simply Saline nasal spray twice daily to flush your sinuses out. 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 Dehn start on predniSONE and benzonatate. I am also having him maintain his multivitamin with minerals, traMADol, and meclizine.  Meds ordered this encounter  Medications  . predniSONE (DELTASONE) 10 MG tablet     Sig: 6 tablets on Day 1 , then reduce by 1 tablet daily until gone    Dispense:  21 tablet    Refill:  0  . benzonatate (TESSALON) 100 MG capsule    Sig: Take 1 capsule (100 mg total) by mouth 2 (two) times daily as needed for cough.    Dispense:  60 capsule    Refill:  0    There are no discontinued medications.  Follow-up: Return in about 4 weeks (around 01/01/2016).   Crecencio Mc, MD

## 2015-12-06 ENCOUNTER — Encounter: Payer: Self-pay | Admitting: Internal Medicine

## 2015-12-06 ENCOUNTER — Other Ambulatory Visit: Payer: Self-pay | Admitting: Internal Medicine

## 2015-12-06 DIAGNOSIS — J209 Acute bronchitis, unspecified: Secondary | ICD-10-CM | POA: Insufficient documentation

## 2015-12-06 MED ORDER — ERGOCALCIFEROL 1.25 MG (50000 UT) PO CAPS
50000.0000 [IU] | ORAL_CAPSULE | ORAL | Status: DC
Start: 2015-12-06 — End: 2016-10-08

## 2015-12-06 NOTE — Assessment & Plan Note (Signed)
I am prescribing a prednisone taper, attime cough.  Use the old robitussin or Delsym OTC for daytime cough You should use either sudafed PE/afrin for nasal congestion or Afrin nasal spray twice daily for 5 days for the ear and sinus congestion I also recommending using Simply Saline nasal spray twice daily to flush your sinuses out. (available OTC)

## 2015-12-06 NOTE — Assessment & Plan Note (Signed)
Wounds were examined today for staging and signs of infection.  Referral to wound [facility].

## 2015-12-06 NOTE — Assessment & Plan Note (Signed)

## 2015-12-06 NOTE — Assessment & Plan Note (Signed)
Iatrongenic. With recurrent bilateral pressure ulcer formation

## 2015-12-06 NOTE — Assessment & Plan Note (Signed)
Untreated due to patient deferring titration study.  Advised strongly to have this done.  Reordered.

## 2015-12-16 DIAGNOSIS — E785 Hyperlipidemia, unspecified: Secondary | ICD-10-CM | POA: Diagnosis not present

## 2015-12-16 DIAGNOSIS — G9009 Other idiopathic peripheral autonomic neuropathy: Secondary | ICD-10-CM | POA: Diagnosis not present

## 2015-12-16 DIAGNOSIS — L97519 Non-pressure chronic ulcer of other part of right foot with unspecified severity: Secondary | ICD-10-CM | POA: Diagnosis not present

## 2015-12-16 DIAGNOSIS — L8994 Pressure ulcer of unspecified site, stage 4: Secondary | ICD-10-CM | POA: Diagnosis not present

## 2015-12-16 DIAGNOSIS — Z7982 Long term (current) use of aspirin: Secondary | ICD-10-CM | POA: Diagnosis not present

## 2015-12-16 DIAGNOSIS — Z981 Arthrodesis status: Secondary | ICD-10-CM | POA: Diagnosis not present

## 2015-12-16 DIAGNOSIS — M86172 Other acute osteomyelitis, left ankle and foot: Secondary | ICD-10-CM | POA: Diagnosis not present

## 2015-12-16 DIAGNOSIS — G629 Polyneuropathy, unspecified: Secondary | ICD-10-CM | POA: Diagnosis not present

## 2015-12-16 DIAGNOSIS — G4733 Obstructive sleep apnea (adult) (pediatric): Secondary | ICD-10-CM | POA: Diagnosis not present

## 2015-12-16 DIAGNOSIS — L03116 Cellulitis of left lower limb: Secondary | ICD-10-CM | POA: Diagnosis not present

## 2015-12-16 DIAGNOSIS — Z8614 Personal history of Methicillin resistant Staphylococcus aureus infection: Secondary | ICD-10-CM | POA: Diagnosis not present

## 2015-12-16 DIAGNOSIS — L84 Corns and callosities: Secondary | ICD-10-CM | POA: Diagnosis not present

## 2015-12-16 DIAGNOSIS — Z89421 Acquired absence of other right toe(s): Secondary | ICD-10-CM | POA: Diagnosis not present

## 2015-12-16 DIAGNOSIS — L03032 Cellulitis of left toe: Secondary | ICD-10-CM | POA: Diagnosis not present

## 2015-12-16 DIAGNOSIS — Z6833 Body mass index (BMI) 33.0-33.9, adult: Secondary | ICD-10-CM | POA: Diagnosis not present

## 2015-12-16 DIAGNOSIS — L97412 Non-pressure chronic ulcer of right heel and midfoot with fat layer exposed: Secondary | ICD-10-CM | POA: Diagnosis not present

## 2015-12-16 DIAGNOSIS — E669 Obesity, unspecified: Secondary | ICD-10-CM | POA: Diagnosis not present

## 2015-12-16 DIAGNOSIS — Z89422 Acquired absence of other left toe(s): Secondary | ICD-10-CM | POA: Diagnosis not present

## 2015-12-16 DIAGNOSIS — S92342A Displaced fracture of fourth metatarsal bone, left foot, initial encounter for closed fracture: Secondary | ICD-10-CM | POA: Diagnosis not present

## 2015-12-16 DIAGNOSIS — Z89411 Acquired absence of right great toe: Secondary | ICD-10-CM | POA: Diagnosis not present

## 2015-12-16 DIAGNOSIS — L97529 Non-pressure chronic ulcer of other part of left foot with unspecified severity: Secondary | ICD-10-CM | POA: Diagnosis not present

## 2015-12-16 DIAGNOSIS — M7989 Other specified soft tissue disorders: Secondary | ICD-10-CM | POA: Diagnosis not present

## 2015-12-17 DIAGNOSIS — M86172 Other acute osteomyelitis, left ankle and foot: Secondary | ICD-10-CM | POA: Diagnosis not present

## 2015-12-17 DIAGNOSIS — L84 Corns and callosities: Secondary | ICD-10-CM | POA: Diagnosis not present

## 2015-12-17 DIAGNOSIS — L97412 Non-pressure chronic ulcer of right heel and midfoot with fat layer exposed: Secondary | ICD-10-CM | POA: Diagnosis not present

## 2015-12-18 DIAGNOSIS — Z89422 Acquired absence of other left toe(s): Secondary | ICD-10-CM | POA: Diagnosis not present

## 2015-12-18 DIAGNOSIS — M7989 Other specified soft tissue disorders: Secondary | ICD-10-CM | POA: Diagnosis not present

## 2015-12-18 DIAGNOSIS — M86172 Other acute osteomyelitis, left ankle and foot: Secondary | ICD-10-CM | POA: Diagnosis not present

## 2015-12-19 DIAGNOSIS — M86172 Other acute osteomyelitis, left ankle and foot: Secondary | ICD-10-CM | POA: Diagnosis not present

## 2015-12-19 DIAGNOSIS — G9009 Other idiopathic peripheral autonomic neuropathy: Secondary | ICD-10-CM | POA: Diagnosis not present

## 2015-12-19 DIAGNOSIS — L03032 Cellulitis of left toe: Secondary | ICD-10-CM | POA: Diagnosis not present

## 2015-12-19 DIAGNOSIS — Z8614 Personal history of Methicillin resistant Staphylococcus aureus infection: Secondary | ICD-10-CM | POA: Diagnosis not present

## 2015-12-21 ENCOUNTER — Telehealth: Payer: Self-pay | Admitting: Internal Medicine

## 2015-12-21 DIAGNOSIS — L03116 Cellulitis of left lower limb: Secondary | ICD-10-CM | POA: Diagnosis not present

## 2015-12-21 DIAGNOSIS — M86172 Other acute osteomyelitis, left ankle and foot: Secondary | ICD-10-CM | POA: Diagnosis not present

## 2015-12-21 NOTE — Telephone Encounter (Signed)
HFU/ Diagnosis is Acute left foot ulcer with infection, Pt is being discharged today. No avail appt to sch. Pt needs to be seen within 2 weeks. Call pt @ [phone] and cell [phone]. Pt is a transition pt. Also please call [facility] @ [phone] with appt date and time. Thank you!

## 2015-12-21 NOTE — Telephone Encounter (Signed)
Thank you. I will follow up with transitional care management and call Malachy Mood at [facility].

## 2015-12-22 ENCOUNTER — Telehealth: Payer: Self-pay

## 2015-12-22 DIAGNOSIS — M86172 Other acute osteomyelitis, left ankle and foot: Secondary | ICD-10-CM | POA: Diagnosis not present

## 2015-12-22 DIAGNOSIS — M869 Osteomyelitis, unspecified: Secondary | ICD-10-CM | POA: Diagnosis not present

## 2015-12-22 NOTE — Telephone Encounter (Signed)
Transition Care Management Follow-up Telephone Call   Date discharged? 12/21/15   How have you been since you were released from the hospital? Doing ok.  No issues.  HH assisting with bandages and medication.  Swelling and redness sem to be doing better.    Do you understand why you were in the hospital? YES, my feet had open sores, swelling and redness.  L worse than R foot.   Do you understand the discharge instructions? YES, keep my bandages dry/clean, prop feet often, walk as long as there is no pain and wear special shoes.   Where were you discharged to? HOME.   Items Reviewed:  Medications reviewed: YES, taking all home scheduled medications as directed.  HH with pic line.  Allergies reviewed: YES, no changes.  Dietary changes reviewed: YES, regular diet.  Referrals reviewed: YES, appointment scheduled with podiatry and PCP.   Functional Questionnaire:   Activities of Daily Living (ADLs):   He states they are independent in the following: Independent in all ADLs. States they require assistance with the following: Does not require assistance at this time.   Any transportation issues/concerns?: NO, I am ok to drive.   Any patient concerns? No, none at this time.   Confirmed importance and date/time of follow-up visits scheduled:  YES, appointment scheduled 12/31/15 at 1130.  Provider Appointment booked with Dr. Derrel Nip (PCP).  Confirmed with patient if condition begins to worsen call PCP or go to the ER.  Patient was given the office number and encouraged to call back with question or concerns.  : YES, patient verbalized understanding.

## 2015-12-22 NOTE — Telephone Encounter (Signed)
Unable to reach patient.  Attempt to follow up with transitional care management.  Left a message on VM with appointment date/time and message to return my call for follow up as to how he is doing when possible.

## 2015-12-24 DIAGNOSIS — M86172 Other acute osteomyelitis, left ankle and foot: Secondary | ICD-10-CM | POA: Diagnosis not present

## 2015-12-24 DIAGNOSIS — M869 Osteomyelitis, unspecified: Secondary | ICD-10-CM | POA: Diagnosis not present

## 2015-12-25 DIAGNOSIS — L03116 Cellulitis of left lower limb: Secondary | ICD-10-CM | POA: Diagnosis not present

## 2015-12-28 DIAGNOSIS — L03116 Cellulitis of left lower limb: Secondary | ICD-10-CM | POA: Diagnosis not present

## 2015-12-29 DIAGNOSIS — M869 Osteomyelitis, unspecified: Secondary | ICD-10-CM | POA: Diagnosis not present

## 2015-12-29 DIAGNOSIS — M86172 Other acute osteomyelitis, left ankle and foot: Secondary | ICD-10-CM | POA: Diagnosis not present

## 2015-12-29 DIAGNOSIS — L03116 Cellulitis of left lower limb: Secondary | ICD-10-CM | POA: Diagnosis not present

## 2015-12-30 DIAGNOSIS — L97512 Non-pressure chronic ulcer of other part of right foot with fat layer exposed: Secondary | ICD-10-CM | POA: Diagnosis not present

## 2015-12-30 DIAGNOSIS — L97522 Non-pressure chronic ulcer of other part of left foot with fat layer exposed: Secondary | ICD-10-CM | POA: Diagnosis not present

## 2015-12-30 DIAGNOSIS — L03116 Cellulitis of left lower limb: Secondary | ICD-10-CM | POA: Diagnosis not present

## 2015-12-31 ENCOUNTER — Ambulatory Visit: Payer: BLUE CROSS/BLUE SHIELD | Admitting: Internal Medicine

## 2016-01-05 DIAGNOSIS — M869 Osteomyelitis, unspecified: Secondary | ICD-10-CM | POA: Diagnosis not present

## 2016-01-05 DIAGNOSIS — M86172 Other acute osteomyelitis, left ankle and foot: Secondary | ICD-10-CM | POA: Diagnosis not present

## 2016-01-06 ENCOUNTER — Telehealth: Payer: Self-pay | Admitting: *Deleted

## 2016-01-06 DIAGNOSIS — L03116 Cellulitis of left lower limb: Secondary | ICD-10-CM | POA: Diagnosis not present

## 2016-01-06 DIAGNOSIS — M86172 Other acute osteomyelitis, left ankle and foot: Secondary | ICD-10-CM | POA: Diagnosis not present

## 2016-01-06 NOTE — Telephone Encounter (Signed)
I see he had a 4 week follow up from [facility] on 12/04/15 and he also was to have a follow up from Hospital. Not sure what exactly the reason for this appointment. Please advise.

## 2016-01-06 NOTE — Telephone Encounter (Signed)
Patient cancelled his appt for 05/10, he stated that he was not sure of the reason for the appt. In his previous appt. He has a cancelled HFU. He stated that he would reschedule if a nurse from this office would call to inform him the reason for the appt. Patient stated that he has a nurse that come out to his home for treatment. Please advise.

## 2016-01-06 NOTE — Telephone Encounter (Signed)
Patient is seeing a nurse for wound care at home does patient still to come in at this time?

## 2016-01-06 NOTE — Telephone Encounter (Signed)
He does not need to have a follow up since he had foot surgery at [facility] and is receiving home health wound care

## 2016-01-07 ENCOUNTER — Ambulatory Visit: Payer: BLUE CROSS/BLUE SHIELD | Admitting: Internal Medicine

## 2016-01-08 DIAGNOSIS — M869 Osteomyelitis, unspecified: Secondary | ICD-10-CM | POA: Diagnosis not present

## 2016-01-09 DIAGNOSIS — M86172 Other acute osteomyelitis, left ankle and foot: Secondary | ICD-10-CM | POA: Diagnosis not present

## 2016-01-09 DIAGNOSIS — L03116 Cellulitis of left lower limb: Secondary | ICD-10-CM | POA: Diagnosis not present

## 2016-01-12 DIAGNOSIS — M869 Osteomyelitis, unspecified: Secondary | ICD-10-CM | POA: Diagnosis not present

## 2016-01-13 DIAGNOSIS — L03116 Cellulitis of left lower limb: Secondary | ICD-10-CM | POA: Diagnosis not present

## 2016-01-13 DIAGNOSIS — M86172 Other acute osteomyelitis, left ankle and foot: Secondary | ICD-10-CM | POA: Diagnosis not present

## 2016-01-14 DIAGNOSIS — M86172 Other acute osteomyelitis, left ankle and foot: Secondary | ICD-10-CM | POA: Diagnosis not present

## 2016-01-14 DIAGNOSIS — M869 Osteomyelitis, unspecified: Secondary | ICD-10-CM | POA: Diagnosis not present

## 2016-01-15 DIAGNOSIS — M86172 Other acute osteomyelitis, left ankle and foot: Secondary | ICD-10-CM | POA: Diagnosis not present

## 2016-01-15 DIAGNOSIS — Z6833 Body mass index (BMI) 33.0-33.9, adult: Secondary | ICD-10-CM | POA: Diagnosis not present

## 2016-01-16 DIAGNOSIS — M86172 Other acute osteomyelitis, left ankle and foot: Secondary | ICD-10-CM | POA: Diagnosis not present

## 2016-01-19 DIAGNOSIS — E11621 Type 2 diabetes mellitus with foot ulcer: Secondary | ICD-10-CM | POA: Diagnosis not present

## 2016-01-19 DIAGNOSIS — L97511 Non-pressure chronic ulcer of other part of right foot limited to breakdown of skin: Secondary | ICD-10-CM | POA: Diagnosis not present

## 2016-01-19 DIAGNOSIS — L97529 Non-pressure chronic ulcer of other part of left foot with unspecified severity: Secondary | ICD-10-CM | POA: Diagnosis not present

## 2016-01-19 DIAGNOSIS — M86172 Other acute osteomyelitis, left ankle and foot: Secondary | ICD-10-CM | POA: Diagnosis not present

## 2016-01-19 DIAGNOSIS — L03116 Cellulitis of left lower limb: Secondary | ICD-10-CM | POA: Diagnosis not present

## 2016-01-19 DIAGNOSIS — M8610 Other acute osteomyelitis, unspecified site: Secondary | ICD-10-CM | POA: Diagnosis not present

## 2016-01-20 DIAGNOSIS — L03116 Cellulitis of left lower limb: Secondary | ICD-10-CM | POA: Diagnosis not present

## 2016-01-20 DIAGNOSIS — M86172 Other acute osteomyelitis, left ankle and foot: Secondary | ICD-10-CM | POA: Diagnosis not present

## 2016-01-20 DIAGNOSIS — L97529 Non-pressure chronic ulcer of other part of left foot with unspecified severity: Secondary | ICD-10-CM | POA: Diagnosis not present

## 2016-01-20 DIAGNOSIS — L97511 Non-pressure chronic ulcer of other part of right foot limited to breakdown of skin: Secondary | ICD-10-CM | POA: Diagnosis not present

## 2016-01-20 DIAGNOSIS — E11621 Type 2 diabetes mellitus with foot ulcer: Secondary | ICD-10-CM | POA: Diagnosis not present

## 2016-01-22 DIAGNOSIS — M86172 Other acute osteomyelitis, left ankle and foot: Secondary | ICD-10-CM | POA: Diagnosis not present

## 2016-01-28 DIAGNOSIS — M86172 Other acute osteomyelitis, left ankle and foot: Secondary | ICD-10-CM | POA: Diagnosis not present

## 2016-01-28 DIAGNOSIS — M8610 Other acute osteomyelitis, unspecified site: Secondary | ICD-10-CM | POA: Diagnosis not present

## 2016-02-10 DIAGNOSIS — L97509 Non-pressure chronic ulcer of other part of unspecified foot with unspecified severity: Secondary | ICD-10-CM | POA: Diagnosis not present

## 2016-02-10 DIAGNOSIS — E11621 Type 2 diabetes mellitus with foot ulcer: Secondary | ICD-10-CM | POA: Diagnosis not present

## 2016-02-10 DIAGNOSIS — L97519 Non-pressure chronic ulcer of other part of right foot with unspecified severity: Secondary | ICD-10-CM | POA: Diagnosis not present

## 2016-03-04 DIAGNOSIS — L97509 Non-pressure chronic ulcer of other part of unspecified foot with unspecified severity: Secondary | ICD-10-CM | POA: Diagnosis not present

## 2016-03-25 DIAGNOSIS — E11622 Type 2 diabetes mellitus with other skin ulcer: Secondary | ICD-10-CM | POA: Diagnosis not present

## 2016-03-25 DIAGNOSIS — M7989 Other specified soft tissue disorders: Secondary | ICD-10-CM | POA: Diagnosis not present

## 2016-03-25 DIAGNOSIS — L97511 Non-pressure chronic ulcer of other part of right foot limited to breakdown of skin: Secondary | ICD-10-CM | POA: Diagnosis not present

## 2016-03-25 DIAGNOSIS — E11621 Type 2 diabetes mellitus with foot ulcer: Secondary | ICD-10-CM | POA: Diagnosis not present

## 2016-03-25 DIAGNOSIS — Z89411 Acquired absence of right great toe: Secondary | ICD-10-CM | POA: Diagnosis not present

## 2016-04-04 DIAGNOSIS — L97509 Non-pressure chronic ulcer of other part of unspecified foot with unspecified severity: Secondary | ICD-10-CM | POA: Diagnosis not present

## 2016-04-29 ENCOUNTER — Telehealth: Payer: Self-pay | Admitting: Internal Medicine

## 2016-04-29 DIAGNOSIS — R739 Hyperglycemia, unspecified: Secondary | ICD-10-CM

## 2016-04-29 DIAGNOSIS — G4733 Obstructive sleep apnea (adult) (pediatric): Secondary | ICD-10-CM

## 2016-04-29 DIAGNOSIS — D751 Secondary polycythemia: Secondary | ICD-10-CM

## 2016-04-29 DIAGNOSIS — Z125 Encounter for screening for malignant neoplasm of prostate: Secondary | ICD-10-CM

## 2016-04-29 NOTE — Telephone Encounter (Signed)
Pt is requesting to have blood work done and also the 2nd part of his sleep study done. Please call pt at [phone].

## 2016-04-29 NOTE — Telephone Encounter (Signed)
Patient is requesting and A1c lab. Please advise.

## 2016-04-29 NOTE — Telephone Encounter (Signed)
Left message for patient to return call back.  

## 2016-04-29 NOTE — Telephone Encounter (Signed)
PAP titration study ordered,  Positive sleep study April 2015 .  Has deferred follow up until now.

## 2016-04-29 NOTE — Telephone Encounter (Signed)
Please advise 

## 2016-04-29 NOTE — Telephone Encounter (Signed)
Pt was informed of information, he requested to have a [place] lab ordered.

## 2016-04-29 NOTE — Telephone Encounter (Signed)
It has already been ordered,

## 2016-05-06 DIAGNOSIS — L03115 Cellulitis of right lower limb: Secondary | ICD-10-CM | POA: Diagnosis not present

## 2016-05-06 DIAGNOSIS — L97412 Non-pressure chronic ulcer of right heel and midfoot with fat layer exposed: Secondary | ICD-10-CM | POA: Diagnosis not present

## 2016-05-06 DIAGNOSIS — M19071 Primary osteoarthritis, right ankle and foot: Secondary | ICD-10-CM | POA: Diagnosis not present

## 2016-05-06 DIAGNOSIS — E11621 Type 2 diabetes mellitus with foot ulcer: Secondary | ICD-10-CM | POA: Diagnosis not present

## 2016-05-06 DIAGNOSIS — E11622 Type 2 diabetes mellitus with other skin ulcer: Secondary | ICD-10-CM | POA: Diagnosis not present

## 2016-05-06 LAB — HEMOGLOBIN A1C: Hemoglobin A1C: 6

## 2016-05-09 DIAGNOSIS — S91309A Unspecified open wound, unspecified foot, initial encounter: Secondary | ICD-10-CM | POA: Diagnosis not present

## 2016-05-09 DIAGNOSIS — E11621 Type 2 diabetes mellitus with foot ulcer: Secondary | ICD-10-CM | POA: Diagnosis not present

## 2016-05-09 DIAGNOSIS — L97412 Non-pressure chronic ulcer of right heel and midfoot with fat layer exposed: Secondary | ICD-10-CM | POA: Diagnosis not present

## 2016-05-09 DIAGNOSIS — Z6833 Body mass index (BMI) 33.0-33.9, adult: Secondary | ICD-10-CM | POA: Diagnosis not present

## 2016-05-09 DIAGNOSIS — L03115 Cellulitis of right lower limb: Secondary | ICD-10-CM | POA: Diagnosis not present

## 2016-05-10 ENCOUNTER — Telehealth: Payer: Self-pay | Admitting: Internal Medicine

## 2016-05-10 ENCOUNTER — Other Ambulatory Visit (INDEPENDENT_AMBULATORY_CARE_PROVIDER_SITE_OTHER): Payer: BLUE CROSS/BLUE SHIELD

## 2016-05-10 ENCOUNTER — Encounter (INDEPENDENT_AMBULATORY_CARE_PROVIDER_SITE_OTHER): Payer: Self-pay

## 2016-05-10 DIAGNOSIS — Z125 Encounter for screening for malignant neoplasm of prostate: Secondary | ICD-10-CM | POA: Diagnosis not present

## 2016-05-10 DIAGNOSIS — D751 Secondary polycythemia: Secondary | ICD-10-CM | POA: Diagnosis not present

## 2016-05-10 DIAGNOSIS — R739 Hyperglycemia, unspecified: Secondary | ICD-10-CM | POA: Diagnosis not present

## 2016-05-10 LAB — CBC WITH DIFFERENTIAL/PLATELET
Basophils Absolute: 0.1 10*3/uL (ref 0.0–0.1)
Basophils Relative: 1.3 % (ref 0.0–3.0)
Eosinophils Absolute: 0.5 10*3/uL (ref 0.0–0.7)
Eosinophils Relative: 6.4 % — ABNORMAL HIGH (ref 0.0–5.0)
HCT: 48.8 % (ref 39.0–52.0)
Hemoglobin: 16.8 g/dL (ref 13.0–17.0)
Lymphocytes Relative: 27.1 % (ref 12.0–46.0)
Lymphs Abs: 2 10*3/uL (ref 0.7–4.0)
MCHC: 34.4 g/dL (ref 30.0–36.0)
MCV: 90.6 fl (ref 78.0–100.0)
Monocytes Absolute: 0.9 10*3/uL (ref 0.1–1.0)
Monocytes Relative: 11.8 % (ref 3.0–12.0)
Neutro Abs: 3.9 10*3/uL (ref 1.4–7.7)
Neutrophils Relative %: 53.4 % (ref 43.0–77.0)
Platelets: 225 10*3/uL (ref 150.0–400.0)
RBC: 5.38 Mil/uL (ref 4.22–5.81)
RDW: 14.3 % (ref 11.5–15.5)
WBC: 7.4 10*3/uL (ref 4.0–10.5)

## 2016-05-10 LAB — COMPREHENSIVE METABOLIC PANEL
ALT: 34 U/L (ref 0–53)
AST: 31 U/L (ref 0–37)
Albumin: 3.9 g/dL (ref 3.5–5.2)
Alkaline Phosphatase: 39 U/L (ref 39–117)
BUN: 12 mg/dL (ref 6–23)
CO2: 27 mEq/L (ref 19–32)
Calcium: 8.5 mg/dL (ref 8.4–10.5)
Chloride: 105 mEq/L (ref 96–112)
Creatinine, Ser: 0.76 mg/dL (ref 0.40–1.50)
GFR: 114.58 mL/min (ref >60.00)
Glucose, Bld: 112 mg/dL — ABNORMAL HIGH (ref 70–99)
Potassium: 4.4 mEq/L (ref 3.5–5.1)
Sodium: 136 mEq/L (ref 135–145)
Total Bilirubin: 0.7 mg/dL (ref 0.2–1.2)
Total Protein: 7.3 g/dL (ref 6.0–8.3)

## 2016-05-10 LAB — LIPID PANEL
Cholesterol: 147 mg/dL (ref 0–200)
HDL: 31.8 mg/dL — ABNORMAL LOW (ref >39.00)
LDL Cholesterol: 90 mg/dL (ref 0–99)
NonHDL: 114.91
Total CHOL/HDL Ratio: 5
Triglycerides: 123 mg/dL (ref 0.0–149.0)
VLDL: 24.6 mg/dL (ref 0.0–40.0)

## 2016-05-10 LAB — PSA: PSA: 0.32 ng/mL (ref 0.10–4.00)

## 2016-05-10 NOTE — Telephone Encounter (Signed)
thanks

## 2016-05-10 NOTE — Telephone Encounter (Signed)
Patient in lab today informed me that he had an A1C at [facility] on 05/06/16 retracted results from [place] and abstracted to chart. A1c = 6.0 on 05/06/16. Patient POC glucose same day was [place]. Did not draw A1c in lab today due to cost to patient.

## 2016-05-11 ENCOUNTER — Telehealth: Payer: Self-pay | Admitting: *Deleted

## 2016-05-11 NOTE — Telephone Encounter (Signed)
Patient has requested lab results Pt contact [phone]

## 2016-05-11 NOTE — Telephone Encounter (Signed)
Please advise on Lab results, labs done yesterday, in chart for review, thanks

## 2016-05-12 ENCOUNTER — Ambulatory Visit: Payer: BLUE CROSS/BLUE SHIELD | Admitting: Family

## 2016-05-12 ENCOUNTER — Encounter: Payer: Self-pay | Admitting: *Deleted

## 2016-05-12 DIAGNOSIS — Z0289 Encounter for other administrative examinations: Secondary | ICD-10-CM

## 2016-05-16 ENCOUNTER — Ambulatory Visit (INDEPENDENT_AMBULATORY_CARE_PROVIDER_SITE_OTHER): Payer: BLUE CROSS/BLUE SHIELD | Admitting: Cardiovascular Disease

## 2016-05-16 ENCOUNTER — Encounter: Payer: Self-pay | Admitting: Cardiovascular Disease

## 2016-05-16 VITALS — BP 136/70 | HR 81 | Ht 70.0 in | Wt 236.5 lb

## 2016-05-16 DIAGNOSIS — E669 Obesity, unspecified: Secondary | ICD-10-CM | POA: Diagnosis not present

## 2016-05-16 DIAGNOSIS — R0789 Other chest pain: Secondary | ICD-10-CM | POA: Diagnosis not present

## 2016-05-16 DIAGNOSIS — G4733 Obstructive sleep apnea (adult) (pediatric): Secondary | ICD-10-CM | POA: Diagnosis not present

## 2016-05-16 NOTE — Patient Instructions (Signed)
Medication Instructions:   No medication changes made  Labwork:  No new labs needed  Testing/Procedures:  No further testing at this time  Research CT coronary calcium score  $150   Follow-Up: It was a pleasure seeing you in the office today. Please call us if you have new issues that need to be addressed before your next appt.  [phone]  Your physician wants you to follow-up in: 12 months.  You will receive a reminder letter in the mail two months in advance. If you don't receive a letter, please call our office to schedule the follow-up appointment.  If you need a refill on your cardiac medications before your next appointment, please call your pharmacy.

## 2016-05-16 NOTE — Progress Notes (Signed)
Cardiology Office Note  Date:  05/16/2016   ID:  RAMEEN ALPERS, DOB Jan 29, 1966, MRN [id number]  PCP:  Crecencio Mc, MD   Chief Complaint  Patient presents with  . other    12 month follow up. Meds reviewed by the pt. verbally. "doing well."     HPI:  Mr. [name] is a 50 year old gentleman with history of sleep apnea, not on CPAP, prior history of foot surgery, who is currently not working, has had weight gain over the past several years, now working out with his son on a more regular basis doing predominantly weights, Previous episode of shoulder and chest pain, presents for follow-up of his chest pain symptoms  He reports that he is doing well, denies any significant chest pain, shoulder pain He has not been working out recently Was previously working out with his son Weight stable but elevated  Lab work reviewed with him, total cholesterol around 147 on no medication Sugar borderline elevated  He does report a strong family history of coronary artery disease. Dad (smoker) and grandfather (nonsmoker?)  had MI and CVA He is a nonsmoker, nondiabetic.   EKG on today's visit shows normal sinus rhythm with rate 80 bpm, no significant ST or T-wave changes  Other past medical history He reports that he does not use a CPAP as he did not want to go back and do part two of the study. He had a terrible night on part 1.   PMH:   has a past medical history of Allergy; Depression; and Sleep apnea.  PSH:    Past Surgical History:  Procedure Laterality Date  . FOOT SURGERY Right 02/14   cyst removed  . 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 ergocalciferol (DRISDOL) 50000 units capsule Take 1 capsule (50,000 Units total) by mouth once a week. 12 capsule 0  . meclizine (ANTIVERT) 25 MG tablet Take 1 tablet (25 mg total) by mouth 3 (three) times daily as needed for dizziness. 30 tablet 0  . Multiple  Vitamins-Minerals (MULTIVITAMIN WITH MINERALS) tablet Take 1 tablet by mouth daily.    . traMADol (ULTRAM) 50 MG tablet Take 1 tablet (50 mg total) by mouth every 8 (eight) hours as needed. 90 tablet 2   No current facility-administered medications for this visit.      Allergies:   Review of patient's allergies indicates no known allergies.   Social History:  The patient  reports that he has never smoked. He has never used smokeless tobacco. He reports that he does not drink alcohol or use drugs.   Family History:   family history includes Heart attack (age of onset: 12) in his father; Heart disease in his father, maternal grandmother, and paternal grandfather; Stroke in his father, maternal grandmother, and paternal grandfather.    Review of Systems: Review of Systems  Constitutional: Negative.   Respiratory: Negative.   Cardiovascular: Negative.   Gastrointestinal: Negative.   Musculoskeletal: Negative.   Neurological: Negative.   Psychiatric/Behavioral: Negative.   All other systems reviewed and are negative.    PHYSICAL EXAM: VS:  BP 136/70 (BP Location: Left Arm, Patient Position: Sitting, Cuff Size: Large)   Pulse 81   Ht 5\' 10"  (1.778 m)   Wt 236 lb 8 oz (107.3 kg)   BMI 33.93 kg/m  , BMI Body mass index is 33.93 kg/m. GEN: Well nourished, well developed, in no acute  distress, obese HEENT: normal Neck: no JVD, carotid bruits, or masses Cardiac: RRR; no murmurs, rubs, or gallops,no edema  Respiratory:  clear to auscultation bilaterally, normal work of breathing GI: soft, nontender, nondistended, + BS MS: no deformity or atrophy Skin: warm and dry, no rash Neuro:  Strength and sensation are intact Psych: euthymic mood, full affect    Recent Labs: 12/04/2015: TSH 0.78 05/10/2016: ALT 34; BUN 12; Creatinine, Ser 0.76; Hemoglobin 16.8; Platelets 225.0; Potassium 4.4; Sodium 136    Lipid Panel Lab Results  Component Value Date   CHOL 147 05/10/2016   HDL 31.80  (L) 05/10/2016   LDLCALC 90 05/10/2016   TRIG 123.0 05/10/2016      Wt Readings from Last 3 Encounters:  05/16/16 236 lb 8 oz (107.3 kg)  12/04/15 236 lb 8 oz (107.3 kg)  09/21/15 235 lb (106.6 kg)       ASSESSMENT AND PLAN:  Other chest pain - Plan: EKG 12-Lead Denies any chest pain symptoms No further workup needed at this time  OSA (obstructive sleep apnea) He did not complete part 2 of his sleep study. Does not want to go back  Obesity (BMI 30-39.9)  long discussion concerning diet, restarting his exercise program   recommended low carbohydrates  Family history   father was a smoker Patient does not have any significant risk factors We did discuss CT coronary calcium scoring again   Disposition:   F/U  12 month as needed    Orders Placed This Encounter  Procedures  . EKG 12-Lead     Signed, Esmond Plants, M.D., Ph.D. 05/16/2016  [place], [place]

## 2016-05-20 DIAGNOSIS — S91309A Unspecified open wound, unspecified foot, initial encounter: Secondary | ICD-10-CM | POA: Diagnosis not present

## 2016-05-20 DIAGNOSIS — L97412 Non-pressure chronic ulcer of right heel and midfoot with fat layer exposed: Secondary | ICD-10-CM | POA: Diagnosis not present

## 2016-05-20 DIAGNOSIS — E11621 Type 2 diabetes mellitus with foot ulcer: Secondary | ICD-10-CM | POA: Diagnosis not present

## 2016-06-10 ENCOUNTER — Ambulatory Visit: Payer: BLUE CROSS/BLUE SHIELD | Attending: Specialist

## 2016-06-10 ENCOUNTER — Encounter: Payer: Self-pay | Admitting: Internal Medicine

## 2016-06-10 DIAGNOSIS — G4733 Obstructive sleep apnea (adult) (pediatric): Secondary | ICD-10-CM | POA: Diagnosis not present

## 2016-06-26 ENCOUNTER — Telehealth: Payer: Self-pay | Admitting: Internal Medicine

## 2016-06-26 NOTE — Telephone Encounter (Signed)
Sleep study confirmed that patient has sleep apnea,  but will need a second study to determine paramters for CPAP use.  This has been ordered

## 2016-06-27 DIAGNOSIS — M869 Osteomyelitis, unspecified: Secondary | ICD-10-CM | POA: Diagnosis not present

## 2016-06-27 DIAGNOSIS — L97509 Non-pressure chronic ulcer of other part of unspecified foot with unspecified severity: Secondary | ICD-10-CM | POA: Diagnosis not present

## 2016-06-27 DIAGNOSIS — E1169 Type 2 diabetes mellitus with other specified complication: Secondary | ICD-10-CM | POA: Diagnosis not present

## 2016-06-27 DIAGNOSIS — E11621 Type 2 diabetes mellitus with foot ulcer: Secondary | ICD-10-CM | POA: Diagnosis not present

## 2016-06-27 NOTE — Telephone Encounter (Signed)
Left message for patient to return call to office. 

## 2016-06-28 DIAGNOSIS — E1151 Type 2 diabetes mellitus with diabetic peripheral angiopathy without gangrene: Secondary | ICD-10-CM | POA: Diagnosis not present

## 2016-06-28 DIAGNOSIS — E1140 Type 2 diabetes mellitus with diabetic neuropathy, unspecified: Secondary | ICD-10-CM | POA: Diagnosis not present

## 2016-06-28 DIAGNOSIS — E1169 Type 2 diabetes mellitus with other specified complication: Secondary | ICD-10-CM | POA: Diagnosis not present

## 2016-06-28 DIAGNOSIS — L97412 Non-pressure chronic ulcer of right heel and midfoot with fat layer exposed: Secondary | ICD-10-CM | POA: Diagnosis not present

## 2016-06-28 DIAGNOSIS — Z4781 Encounter for orthopedic aftercare following surgical amputation: Secondary | ICD-10-CM | POA: Diagnosis not present

## 2016-06-28 DIAGNOSIS — R600 Localized edema: Secondary | ICD-10-CM | POA: Diagnosis not present

## 2016-06-28 DIAGNOSIS — G4733 Obstructive sleep apnea (adult) (pediatric): Secondary | ICD-10-CM | POA: Diagnosis not present

## 2016-06-28 DIAGNOSIS — L97829 Non-pressure chronic ulcer of other part of left lower leg with unspecified severity: Secondary | ICD-10-CM | POA: Diagnosis not present

## 2016-06-28 DIAGNOSIS — M868X7 Other osteomyelitis, ankle and foot: Secondary | ICD-10-CM | POA: Diagnosis not present

## 2016-06-28 DIAGNOSIS — M7989 Other specified soft tissue disorders: Secondary | ICD-10-CM | POA: Diagnosis not present

## 2016-06-28 DIAGNOSIS — Z89422 Acquired absence of other left toe(s): Secondary | ICD-10-CM | POA: Diagnosis not present

## 2016-06-28 DIAGNOSIS — L97519 Non-pressure chronic ulcer of other part of right foot with unspecified severity: Secondary | ICD-10-CM | POA: Diagnosis not present

## 2016-06-28 DIAGNOSIS — M869 Osteomyelitis, unspecified: Secondary | ICD-10-CM | POA: Diagnosis not present

## 2016-06-28 DIAGNOSIS — Z89411 Acquired absence of right great toe: Secondary | ICD-10-CM | POA: Diagnosis not present

## 2016-06-28 DIAGNOSIS — E11621 Type 2 diabetes mellitus with foot ulcer: Secondary | ICD-10-CM | POA: Diagnosis not present

## 2016-06-28 DIAGNOSIS — L03115 Cellulitis of right lower limb: Secondary | ICD-10-CM | POA: Diagnosis not present

## 2016-06-29 DIAGNOSIS — Z89411 Acquired absence of right great toe: Secondary | ICD-10-CM | POA: Diagnosis not present

## 2016-06-29 DIAGNOSIS — Z89422 Acquired absence of other left toe(s): Secondary | ICD-10-CM | POA: Diagnosis not present

## 2016-06-29 DIAGNOSIS — L97519 Non-pressure chronic ulcer of other part of right foot with unspecified severity: Secondary | ICD-10-CM | POA: Diagnosis not present

## 2016-06-29 DIAGNOSIS — R600 Localized edema: Secondary | ICD-10-CM | POA: Diagnosis not present

## 2016-06-29 DIAGNOSIS — E1169 Type 2 diabetes mellitus with other specified complication: Secondary | ICD-10-CM | POA: Diagnosis not present

## 2016-06-29 DIAGNOSIS — E11621 Type 2 diabetes mellitus with foot ulcer: Secondary | ICD-10-CM | POA: Diagnosis not present

## 2016-06-29 NOTE — Telephone Encounter (Signed)
Left message for patient to return call to office. 

## 2016-06-30 DIAGNOSIS — E11621 Type 2 diabetes mellitus with foot ulcer: Secondary | ICD-10-CM | POA: Diagnosis not present

## 2016-06-30 DIAGNOSIS — M869 Osteomyelitis, unspecified: Secondary | ICD-10-CM | POA: Diagnosis not present

## 2016-06-30 DIAGNOSIS — L97519 Non-pressure chronic ulcer of other part of right foot with unspecified severity: Secondary | ICD-10-CM | POA: Diagnosis not present

## 2016-06-30 DIAGNOSIS — L03115 Cellulitis of right lower limb: Secondary | ICD-10-CM | POA: Diagnosis not present

## 2016-06-30 DIAGNOSIS — E1169 Type 2 diabetes mellitus with other specified complication: Secondary | ICD-10-CM | POA: Diagnosis not present

## 2016-06-30 HISTORY — PX: TOE AMPUTATION: SHX809

## 2016-07-01 DIAGNOSIS — E11621 Type 2 diabetes mellitus with foot ulcer: Secondary | ICD-10-CM | POA: Diagnosis not present

## 2016-07-01 DIAGNOSIS — L97519 Non-pressure chronic ulcer of other part of right foot with unspecified severity: Secondary | ICD-10-CM | POA: Diagnosis not present

## 2016-07-01 DIAGNOSIS — L97412 Non-pressure chronic ulcer of right heel and midfoot with fat layer exposed: Secondary | ICD-10-CM | POA: Diagnosis not present

## 2016-07-01 DIAGNOSIS — E1169 Type 2 diabetes mellitus with other specified complication: Secondary | ICD-10-CM | POA: Diagnosis not present

## 2016-07-01 DIAGNOSIS — M869 Osteomyelitis, unspecified: Secondary | ICD-10-CM | POA: Diagnosis not present

## 2016-07-03 DIAGNOSIS — E1169 Type 2 diabetes mellitus with other specified complication: Secondary | ICD-10-CM | POA: Diagnosis not present

## 2016-07-05 ENCOUNTER — Encounter: Payer: Self-pay | Admitting: *Deleted

## 2016-07-05 NOTE — Telephone Encounter (Signed)
Letter  Mailed to patient due to unable to reach patient by phone.

## 2016-07-06 DIAGNOSIS — E1169 Type 2 diabetes mellitus with other specified complication: Secondary | ICD-10-CM | POA: Diagnosis not present

## 2016-07-06 DIAGNOSIS — G4733 Obstructive sleep apnea (adult) (pediatric): Secondary | ICD-10-CM | POA: Diagnosis not present

## 2016-07-06 DIAGNOSIS — Z4781 Encounter for orthopedic aftercare following surgical amputation: Secondary | ICD-10-CM | POA: Diagnosis not present

## 2016-07-07 DIAGNOSIS — Z4781 Encounter for orthopedic aftercare following surgical amputation: Secondary | ICD-10-CM | POA: Diagnosis not present

## 2016-07-11 ENCOUNTER — Telehealth: Payer: Self-pay | Admitting: *Deleted

## 2016-07-11 DIAGNOSIS — E1140 Type 2 diabetes mellitus with diabetic neuropathy, unspecified: Secondary | ICD-10-CM | POA: Diagnosis not present

## 2016-07-11 DIAGNOSIS — Z89411 Acquired absence of right great toe: Secondary | ICD-10-CM | POA: Diagnosis not present

## 2016-07-11 DIAGNOSIS — Z452 Encounter for adjustment and management of vascular access device: Secondary | ICD-10-CM | POA: Diagnosis not present

## 2016-07-11 DIAGNOSIS — Z4781 Encounter for orthopedic aftercare following surgical amputation: Secondary | ICD-10-CM | POA: Diagnosis not present

## 2016-07-11 DIAGNOSIS — M86161 Other acute osteomyelitis, right tibia and fibula: Secondary | ICD-10-CM | POA: Diagnosis not present

## 2016-07-11 DIAGNOSIS — Z5181 Encounter for therapeutic drug level monitoring: Secondary | ICD-10-CM | POA: Diagnosis not present

## 2016-07-11 DIAGNOSIS — Z89421 Acquired absence of other right toe(s): Secondary | ICD-10-CM | POA: Diagnosis not present

## 2016-07-11 DIAGNOSIS — E1169 Type 2 diabetes mellitus with other specified complication: Secondary | ICD-10-CM | POA: Diagnosis not present

## 2016-07-11 DIAGNOSIS — Z7901 Long term (current) use of anticoagulants: Secondary | ICD-10-CM | POA: Diagnosis not present

## 2016-07-11 DIAGNOSIS — Z792 Long term (current) use of antibiotics: Secondary | ICD-10-CM | POA: Diagnosis not present

## 2016-07-11 NOTE — Telephone Encounter (Signed)
Patient aware order is in EPIC.

## 2016-07-11 NOTE — Telephone Encounter (Signed)
Pt stated that he will need a order for a second sleep study Pt contact [phone]

## 2016-07-12 DIAGNOSIS — Z4781 Encounter for orthopedic aftercare following surgical amputation: Secondary | ICD-10-CM | POA: Diagnosis not present

## 2016-07-12 DIAGNOSIS — B951 Streptococcus, group B, as the cause of diseases classified elsewhere: Secondary | ICD-10-CM | POA: Diagnosis not present

## 2016-07-12 DIAGNOSIS — Z792 Long term (current) use of antibiotics: Secondary | ICD-10-CM | POA: Diagnosis not present

## 2016-07-12 DIAGNOSIS — E11621 Type 2 diabetes mellitus with foot ulcer: Secondary | ICD-10-CM | POA: Diagnosis not present

## 2016-07-14 DIAGNOSIS — L97516 Non-pressure chronic ulcer of other part of right foot with bone involvement without evidence of necrosis: Secondary | ICD-10-CM | POA: Diagnosis not present

## 2016-07-14 DIAGNOSIS — E11621 Type 2 diabetes mellitus with foot ulcer: Secondary | ICD-10-CM | POA: Diagnosis not present

## 2016-07-20 DIAGNOSIS — Z4781 Encounter for orthopedic aftercare following surgical amputation: Secondary | ICD-10-CM | POA: Diagnosis not present

## 2016-07-26 DIAGNOSIS — B0052 Herpesviral keratitis: Secondary | ICD-10-CM | POA: Diagnosis not present

## 2016-08-05 DIAGNOSIS — Z89431 Acquired absence of right foot: Secondary | ICD-10-CM | POA: Diagnosis not present

## 2016-08-19 DIAGNOSIS — Z6833 Body mass index (BMI) 33.0-33.9, adult: Secondary | ICD-10-CM | POA: Diagnosis not present

## 2016-08-19 DIAGNOSIS — Z89431 Acquired absence of right foot: Secondary | ICD-10-CM | POA: Diagnosis not present

## 2016-08-19 DIAGNOSIS — S91301A Unspecified open wound, right foot, initial encounter: Secondary | ICD-10-CM | POA: Diagnosis not present

## 2016-09-02 ENCOUNTER — Ambulatory Visit: Payer: BLUE CROSS/BLUE SHIELD | Admitting: Internal Medicine

## 2016-09-08 DIAGNOSIS — I739 Peripheral vascular disease, unspecified: Secondary | ICD-10-CM | POA: Diagnosis not present

## 2016-09-09 ENCOUNTER — Telehealth: Payer: Self-pay | Admitting: Internal Medicine

## 2016-09-09 DIAGNOSIS — Z125 Encounter for screening for malignant neoplasm of prostate: Secondary | ICD-10-CM

## 2016-09-09 DIAGNOSIS — E669 Obesity, unspecified: Secondary | ICD-10-CM

## 2016-09-09 DIAGNOSIS — E785 Hyperlipidemia, unspecified: Secondary | ICD-10-CM

## 2016-09-09 DIAGNOSIS — D582 Other hemoglobinopathies: Secondary | ICD-10-CM

## 2016-09-09 DIAGNOSIS — E559 Vitamin D deficiency, unspecified: Secondary | ICD-10-CM

## 2016-09-09 DIAGNOSIS — R7303 Prediabetes: Secondary | ICD-10-CM

## 2016-09-09 DIAGNOSIS — E1169 Type 2 diabetes mellitus with other specified complication: Secondary | ICD-10-CM | POA: Insufficient documentation

## 2016-09-09 NOTE — Telephone Encounter (Signed)
Pt called and was wondering if Dr. Derrel Nip could put in some lab orders for him before his follow up appt on Friday 1/19. Please advise, thank you!  Calla pt @ [phone]

## 2016-09-09 NOTE — Telephone Encounter (Signed)
Please advise 

## 2016-09-09 NOTE — Telephone Encounter (Signed)
Fasting labs, a1c psa ordered

## 2016-09-09 NOTE — Telephone Encounter (Signed)
Patient has been notified and is scheduling lab appointment at the moment.

## 2016-09-13 ENCOUNTER — Other Ambulatory Visit: Payer: BLUE CROSS/BLUE SHIELD

## 2016-09-16 ENCOUNTER — Ambulatory Visit: Payer: BLUE CROSS/BLUE SHIELD | Admitting: Internal Medicine

## 2016-09-29 ENCOUNTER — Ambulatory Visit: Payer: BLUE CROSS/BLUE SHIELD | Admitting: Internal Medicine

## 2016-10-05 ENCOUNTER — Encounter: Payer: Self-pay | Admitting: Internal Medicine

## 2016-10-05 ENCOUNTER — Ambulatory Visit (INDEPENDENT_AMBULATORY_CARE_PROVIDER_SITE_OTHER): Payer: BLUE CROSS/BLUE SHIELD | Admitting: Internal Medicine

## 2016-10-05 DIAGNOSIS — Z125 Encounter for screening for malignant neoplasm of prostate: Secondary | ICD-10-CM | POA: Diagnosis not present

## 2016-10-05 DIAGNOSIS — R7303 Prediabetes: Secondary | ICD-10-CM

## 2016-10-05 DIAGNOSIS — G5793 Unspecified mononeuropathy of bilateral lower limbs: Secondary | ICD-10-CM | POA: Diagnosis not present

## 2016-10-05 DIAGNOSIS — E785 Hyperlipidemia, unspecified: Secondary | ICD-10-CM

## 2016-10-05 DIAGNOSIS — G4733 Obstructive sleep apnea (adult) (pediatric): Secondary | ICD-10-CM

## 2016-10-05 DIAGNOSIS — D582 Other hemoglobinopathies: Secondary | ICD-10-CM

## 2016-10-05 DIAGNOSIS — E559 Vitamin D deficiency, unspecified: Secondary | ICD-10-CM

## 2016-10-05 DIAGNOSIS — E1159 Type 2 diabetes mellitus with other circulatory complications: Secondary | ICD-10-CM | POA: Diagnosis not present

## 2016-10-05 DIAGNOSIS — E669 Obesity, unspecified: Secondary | ICD-10-CM | POA: Diagnosis not present

## 2016-10-05 DIAGNOSIS — F329 Major depressive disorder, single episode, unspecified: Secondary | ICD-10-CM | POA: Diagnosis not present

## 2016-10-05 LAB — LIPID PANEL
Cholesterol: 168 mg/dL (ref 0–200)
HDL: 37.8 mg/dL — ABNORMAL LOW (ref >39.00)
LDL Cholesterol: 105 mg/dL — ABNORMAL HIGH (ref 0–99)
NonHDL: 130.12
Total CHOL/HDL Ratio: 4
Triglycerides: 127 mg/dL (ref 0.0–149.0)
VLDL: 25.4 mg/dL (ref 0.0–40.0)

## 2016-10-05 LAB — COMPREHENSIVE METABOLIC PANEL
ALT: 43 U/L (ref 0–53)
AST: 33 U/L (ref 0–37)
Albumin: 4.2 g/dL (ref 3.5–5.2)
Alkaline Phosphatase: 42 U/L (ref 39–117)
BUN: 14 mg/dL (ref 6–23)
CO2: 23 mEq/L (ref 19–32)
Calcium: 9 mg/dL (ref 8.4–10.5)
Chloride: 105 mEq/L (ref 96–112)
Creatinine, Ser: 0.84 mg/dL (ref 0.40–1.50)
GFR: 101.92 mL/min (ref >60.00)
Glucose, Bld: 109 mg/dL — ABNORMAL HIGH (ref 70–99)
Potassium: 4.1 mEq/L (ref 3.5–5.1)
Sodium: 137 mEq/L (ref 135–145)
Total Bilirubin: 0.7 mg/dL (ref 0.2–1.2)
Total Protein: 7.5 g/dL (ref 6.0–8.3)

## 2016-10-05 LAB — CBC WITH DIFFERENTIAL/PLATELET
Basophils Absolute: 0.2 10*3/uL — ABNORMAL HIGH (ref 0.0–0.1)
Basophils Relative: 3.5 % — ABNORMAL HIGH (ref 0.0–3.0)
Eosinophils Absolute: 0.4 10*3/uL (ref 0.0–0.7)
Eosinophils Relative: 5.7 % — ABNORMAL HIGH (ref 0.0–5.0)
HCT: 50.2 % (ref 39.0–52.0)
Hemoglobin: 16.7 g/dL (ref 13.0–17.0)
Lymphocytes Relative: 26.5 % (ref 12.0–46.0)
Lymphs Abs: 1.9 10*3/uL (ref 0.7–4.0)
MCHC: 33.3 g/dL (ref 30.0–36.0)
MCV: 88.3 fl (ref 78.0–100.0)
Monocytes Absolute: 1 10*3/uL (ref 0.1–1.0)
Monocytes Relative: 13.9 % — ABNORMAL HIGH (ref 3.0–12.0)
Neutro Abs: 3.6 10*3/uL (ref 1.4–7.7)
Neutrophils Relative %: 50.4 % (ref 43.0–77.0)
Platelets: 201 10*3/uL (ref 150.0–400.0)
RBC: 5.68 Mil/uL (ref 4.22–5.81)
RDW: 16 % — ABNORMAL HIGH (ref 11.5–15.5)
WBC: 7.1 10*3/uL (ref 4.0–10.5)

## 2016-10-05 LAB — VITAMIN D 25 HYDROXY (VIT D DEFICIENCY, FRACTURES): VITD: 18.07 ng/mL — ABNORMAL LOW (ref 30.00–100.00)

## 2016-10-05 LAB — LDL CHOLESTEROL, DIRECT: Direct LDL: 111 mg/dL

## 2016-10-05 LAB — HEMOGLOBIN A1C: Hgb A1c MFr Bld: 6.5 % (ref 4.6–6.5)

## 2016-10-05 LAB — PSA: PSA: 0.2 ng/mL (ref 0.10–4.00)

## 2016-10-05 LAB — MICROALBUMIN / CREATININE URINE RATIO
Creatinine,U: 152.4 mg/dL
Microalb Creat Ratio: 1.6 mg/g (ref 0.0–30.0)
Microalb, Ur: 2.4 mg/dL — ABNORMAL HIGH (ref 0.0–1.9)

## 2016-10-05 MED ORDER — PREGABALIN 75 MG PO CAPS: 75.0000 mg | ORAL_CAPSULE | Freq: Every day | ORAL | 2 refills | Status: DC

## 2016-10-05 NOTE — Progress Notes (Signed)
Subjective:  Patient ID: [name], male    DOB: [birth year]  Age: 51 y.o. MRN: [id number]  CC: Diagnoses of Neuropathy of both feet, Prediabetes, Hyperlipidemia LDL goal <100, Abnormal hemoglobin ([place]), Vitamin D deficiency, Prostate cancer screening, OSA (obstructive sleep apnea), Type 2 diabetes mellitus with other circulatory complications (CODE) ([place]), Obesity (BMI 30-39.9), and Reactive depression were pertinent to this visit.  HPI [name] presents for FOLLOW UP ON SLEEP APNEA , prediabetes AND DEPRESSION. Last seen April 2017 HAS HAD MORE surgery on the right foot  DUE TO  Peripheral neuropathy  resulting in  due to non healing ulcer . 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 Done by  Adriana Mccallum at [place] bcaused by ill fitting prosthetic after his great toe amputation complicated by idiopathic peripheral neuropathy.   abi's were > 1 in October at [facility].   FITTING FOR PROTHESIS was delayed BY NEW WOUND ALONG INCISION LINE  PER  POST OP VISIT JAN 11 . Has been using scooter,  Offloading shoes. Minimal ambulation   SEES VASC SURG TOMORROW,  WOUND HAS HEALED USING SILVagel AND  NONWEIGHT BEaring except for driving.  Measured for the inset last week,  Will take 3 weeks  To make.    Has a callous on left foot that he has been paring himslef due to missing appt with podiatrist at [facility] during winter snow.  Has not rescheduled   Has chronic pain due to neuropathy that keeps him awake at night, taking tramadol at night which makes him groggy for several hours the next day even if he takes it by 10 pm.  Uses it 1-5 times per week, requesting that I prescribe oxy IR because it makes him sleepy.   Gabapentin caused his heart to race  He has OSA without CPAP use. 3rd sleep study was positive in October ,  . CPAP titration study was ordered but never done bc of communication issues . Now he can't afford  it. UNTIL HE MEETS HIS DUDCITBLE  Outpatient Medications Prior to Visit  Medication Sig Dispense Refill  . ergocalciferol (DRISDOL) 50000 units capsule Take 1 capsule (50,000 Units total) by mouth once a week. 12 capsule 0  . Multiple Vitamins-Minerals (MULTIVITAMIN WITH MINERALS) tablet Take 1 tablet by mouth daily.    . meclizine (ANTIVERT) 25 MG tablet Take 1 tablet (25 mg total) by mouth 3 (three) times daily as needed for dizziness. (Patient not taking: Reported on 10/05/2016) 30 tablet 0  . traMADol (ULTRAM) 50 MG tablet Take 1 tablet (50 mg total) by mouth every 8 (eight) hours as needed. (Patient not taking: Reported on 10/05/2016) 90 tablet 2   No facility-administered medications prior to visit.     Review of Systems;  Patient denies headache, fevers, malaise, unintentional weight loss, skin rash, eye pain, sinus congestion and sinus pain, sore throat, dysphagia,  hemoptysis , cough, dyspnea, wheezing, chest pain, palpitations, orthopnea, edema, abdominal pain, nausea, melena, diarrhea, constipation, flank pain, dysuria, hematuria, urinary  Frequency, nocturia, numbness, tingling, seizures,  Focal weakness, Loss of consciousness,  Tremor, insomnia, depression, anxiety, and suicidal ideation.      Objective:  BP 122/80   Pulse 75   Resp 16   Wt 240 lb (108.9 kg)   SpO2 95%   BMI 34.44 kg/m   BP Readings from Last 3 Encounters:  10/05/16  122/80  05/16/16 136/70  12/04/15 118/70    Wt Readings from Last 3 Encounters:  10/05/16 240 lb (108.9 kg)  05/16/16 236 lb 8 oz (107.3 kg)  12/04/15 236 lb 8 oz (107.3 kg)    General appearance: alert, cooperative and appears stated age Ears: normal TM's and external ear canals both ears Throat: lips, mucosa, and tongue normal; teeth and gums normal Neck: no adenopathy, no carotid bruit, supple, symmetrical, trachea midline and thyroid not enlarged, symmetric, no tenderness/mass/nodules Back: symmetric, no curvature. ROM normal. No CVA  tenderness. Lungs: clear to auscultation bilaterally Heart: regular rate and rhythm, S1, S2 normal, no murmur, click, rub or gallop Abdomen: soft, non-tender; bowel sounds normal; no masses,  no organomegaly Pulses: 2+ and symmetric Skin: Skin color, texture, turgor normal. No rashes or lesions Lymph nodes: Cervical, supraclavicular, and axillary nodes normal.  Lab Results  Component Value Date   HGBA1C 6.5 10/05/2016   HGBA1C 6.0 05/06/2016   HGBA1C 5.6 11/06/2013    Lab Results  Component Value Date   CREATININE 0.84 10/05/2016   CREATININE 0.76 05/10/2016   CREATININE 0.86 12/04/2015    Lab Results  Component Value Date   WBC 7.1 10/05/2016   HGB 16.7 10/05/2016   HCT 50.2 10/05/2016   PLT 201.0 10/05/2016   GLUCOSE 109 (H) 10/05/2016   CHOL 168 10/05/2016   TRIG 127.0 10/05/2016   HDL 37.80 (L) 10/05/2016   LDLDIRECT 111.0 10/05/2016   LDLCALC 105 (H) 10/05/2016   ALT 43 10/05/2016   AST 33 10/05/2016   NA 137 10/05/2016   K 4.1 10/05/2016   CL 105 10/05/2016   CREATININE 0.84 10/05/2016   BUN 14 10/05/2016   CO2 23 10/05/2016   TSH 0.78 12/04/2015   PSA 0.20 10/05/2016   HGBA1C 6.5 10/05/2016   MICROALBUR 2.4 (H) 10/05/2016    Ct Head Wo Contrast  Result Date: 09/21/2015 CLINICAL DATA:  Dizziness and nausea. Symptoms began while sitting in a meeting. Frontal headache. EXAM: CT HEAD WITHOUT CONTRAST TECHNIQUE: Contiguous axial images were obtained from the base of the skull through the vertex without intravenous contrast. COMPARISON:  None. FINDINGS: Small focal area of encephalomalacia in the left posterior parietal region. Ventricles and sulci are otherwise symmetrical. No ventricular dilatation. No abnormal extra-axial fluid collections. Gray-white matter junctions are distinct. Basal cisterns are not effaced. No acute intracranial hemorrhage. Calvarium appears intact. Visualized paranasal sinuses and mastoid air cells are not opacified. IMPRESSION: No acute  intracranial abnormalities. Old focal encephalomalacia in the left posterior parietal region. Electronically Signed   By: Lucienne Capers M.D.   On: 09/21/2015 21:59    Assessment & Plan:   Problem List Items Addressed This Visit    Abnormal hemoglobin ([place])   Depression    His mood is improving with return to work, medications deferred at this time.       Neuropathy of both feet    Pain is worse at night,   Several nights per month Did not tolerate trial of gabapentin  Because after several days of nightly use it made his heart race.  Trial of lyrica , request for oxycontin deferred given his untreated OSA         Relevant Medications   pregabalin (LYRICA) 75 MG capsule   Obesity (BMI 30-39.9)    Complicated by inability to exercise due to the serious complications/sequelae of his  peripheral neuropathy I have addressed  BMI and recommended a low glycemic index diet utilizing smaller more  frequent meals to increase metabolism.  I have also recommended that patient start exercising with a goal of 30 minutes of aerobic exercise a minimum of 5 days per week.       OSA (obstructive sleep apnea)    Untreated due to communication issues , now complicated by financial issues . cpap titration ordered but never done due to surgery in November 2017 on right foot. He has now deferred the CPAP titration study once again due to out of pocket cost.   He  cannot afford to have it or CPAP until he meets his deductible for the year      Type 2 diabetes mellitus with other circulatory complications (CODE) ([place])    His fasting glucose has never been 125 but his a1c is now diagnostic of diabetes.  .  I recommend he follow a low glycemic index diet and particpate regularly in an aerobic  exercise activity.  We should re check an A1c in 3 months. .   Lab Results  Component Value Date   HGBA1C 6.5 10/05/2016         Vitamin D deficiency    Other Visit Diagnoses    Hyperlipidemia LDL goal <100         Prostate cancer screening         A total of 40 minutes was spent with patient more than half of which was spent in counseling patient on the above mentioned issues , reviewing and explaining recent labs and imaging studies done, and coordination of care.   I am having Mr. Robin start on pregabalin. I am also having him maintain his multivitamin with minerals, traMADol, meclizine, ergocalciferol, vitamin C, oxycodone, and acyclovir.  Meds ordered this encounter  Medications  . Ascorbic Acid (VITAMIN C) 100 MG tablet    Sig: Take 500 mg by mouth daily.  Marland Kitchen oxycodone (OXY-IR) 5 MG capsule    Sig: Take 5 mg by mouth 4 (four) times daily as needed.  Marland Kitchen acyclovir (ZOVIRAX) 400 MG tablet    Sig: Take 1 tablet by mouth as needed.    Refill:  0  . pregabalin (LYRICA) 75 MG capsule    Sig: Take 1 capsule (75 mg total) by mouth daily. At bedtime for nerve pain    Dispense:  30 capsule    Refill:  2    There are no discontinued medications.  Follow-up: No Follow-up on file.   Crecencio Mc, MD

## 2016-10-05 NOTE — Patient Instructions (Addendum)
Trial of Lyrica for evening  use ,  Take by 8 pm .    PLEASE GET YOUR MYCHART REESTABLISHED   ROWING, RECUMBENT BIKE FOR EXERCISE  LOW CARB DIET

## 2016-10-05 NOTE — Progress Notes (Signed)
Pre visit review using our clinic review tool, if applicable. No additional management support is needed unless otherwise documented below in the visit note. 

## 2016-10-05 NOTE — Assessment & Plan Note (Addendum)
Pain is worse at night,   Several nights per month Did not tolerate trial of gabapentin  Because after several days of nightly use it made his heart race.  Trial of lyrica , request for oxycontin deferred given his untreated OSA

## 2016-10-06 ENCOUNTER — Telehealth: Payer: Self-pay | Admitting: *Deleted

## 2016-10-06 DIAGNOSIS — I739 Peripheral vascular disease, unspecified: Secondary | ICD-10-CM | POA: Diagnosis not present

## 2016-10-06 NOTE — Telephone Encounter (Signed)
Pt requested a medication refill for lyrica  Pharmacy [facility]

## 2016-10-06 NOTE — Telephone Encounter (Signed)
The rx was printed at 10/05/16 OV. I faxed it to [facility]. Will re[facility] to [facility] at [phone].  Pt informed

## 2016-10-08 ENCOUNTER — Telehealth: Payer: Self-pay | Admitting: Internal Medicine

## 2016-10-08 ENCOUNTER — Encounter: Payer: Self-pay | Admitting: Internal Medicine

## 2016-10-08 ENCOUNTER — Other Ambulatory Visit: Payer: Self-pay | Admitting: Internal Medicine

## 2016-10-08 DIAGNOSIS — E1159 Type 2 diabetes mellitus with other circulatory complications: Secondary | ICD-10-CM

## 2016-10-08 MED ORDER — ERGOCALCIFEROL 1.25 MG (50000 UT) PO CAPS: 50000.0000 [IU] | ORAL_CAPSULE | ORAL | 2 refills | Status: DC

## 2016-10-08 NOTE — Assessment & Plan Note (Addendum)
Untreated due to communication issues , now complicated by financial issues . cpap titration ordered but never done due to surgery in November 2017 on right foot. He has now deferred the CPAP titration study once again due to out of pocket cost.   He  cannot afford to have it or CPAP until he meets his deductible for the year

## 2016-10-08 NOTE — Assessment & Plan Note (Addendum)
Complicated by inability to exercise due to the serious complications/sequelae of his  peripheral neuropathy I have addressed  BMI and recommended a low glycemic index diet utilizing smaller more frequent meals to increase metabolism.  I have also recommended that patient start exercising with a goal of 30 minutes of aerobic exercise a minimum of 5 days per week.

## 2016-10-08 NOTE — Assessment & Plan Note (Signed)
His mood is improving with return to work, medications deferred at this time.

## 2016-10-08 NOTE — Assessment & Plan Note (Addendum)
His fasting glucose has never been 125 but his a1c is now diagnostic of diabetes.  .  I recommend he follow a low glycemic index diet and particpate regularly in an aerobic  exercise activity.  We should re check an A1c in 3 months. .   Lab Results  Component Value Date   HGBA1C 6.5 10/05/2016

## 2016-10-15 ENCOUNTER — Encounter: Payer: Self-pay | Admitting: Internal Medicine

## 2016-10-19 DIAGNOSIS — Z6834 Body mass index (BMI) 34.0-34.9, adult: Secondary | ICD-10-CM | POA: Diagnosis not present

## 2016-10-19 DIAGNOSIS — I739 Peripheral vascular disease, unspecified: Secondary | ICD-10-CM | POA: Diagnosis not present

## 2016-10-19 DIAGNOSIS — Z89431 Acquired absence of right foot: Secondary | ICD-10-CM | POA: Diagnosis not present

## 2016-11-21 DIAGNOSIS — E1159 Type 2 diabetes mellitus with other circulatory complications: Secondary | ICD-10-CM | POA: Diagnosis not present

## 2016-11-24 ENCOUNTER — Telehealth: Payer: Self-pay | Admitting: *Deleted

## 2016-11-24 DIAGNOSIS — M25671 Stiffness of right ankle, not elsewhere classified: Secondary | ICD-10-CM | POA: Diagnosis not present

## 2016-11-24 DIAGNOSIS — R293 Abnormal posture: Secondary | ICD-10-CM | POA: Diagnosis not present

## 2016-11-24 DIAGNOSIS — Z89431 Acquired absence of right foot: Secondary | ICD-10-CM | POA: Diagnosis not present

## 2016-11-24 DIAGNOSIS — G5793 Unspecified mononeuropathy of bilateral lower limbs: Secondary | ICD-10-CM | POA: Diagnosis not present

## 2016-11-24 DIAGNOSIS — M25672 Stiffness of left ankle, not elsewhere classified: Secondary | ICD-10-CM | POA: Diagnosis not present

## 2016-11-24 DIAGNOSIS — Z4789 Encounter for other orthopedic aftercare: Secondary | ICD-10-CM | POA: Diagnosis not present

## 2016-11-24 DIAGNOSIS — R269 Unspecified abnormalities of gait and mobility: Secondary | ICD-10-CM | POA: Diagnosis not present

## 2016-11-24 NOTE — Telephone Encounter (Signed)
Spoke with [name] to let her know that we have not seen anything yet. [name] the fax number to the fax machine in our pod because the other one kept failing for her. She is going to fax paperwork again.

## 2016-11-24 NOTE — Telephone Encounter (Signed)
[name] from [place] requested to know if nutrition notes where received.  Contact [name] [phone]

## 2016-12-06 DIAGNOSIS — Z89431 Acquired absence of right foot: Secondary | ICD-10-CM | POA: Diagnosis not present

## 2016-12-06 DIAGNOSIS — M25671 Stiffness of right ankle, not elsewhere classified: Secondary | ICD-10-CM | POA: Diagnosis not present

## 2016-12-06 DIAGNOSIS — R269 Unspecified abnormalities of gait and mobility: Secondary | ICD-10-CM | POA: Diagnosis not present

## 2016-12-06 DIAGNOSIS — Z4789 Encounter for other orthopedic aftercare: Secondary | ICD-10-CM | POA: Diagnosis not present

## 2016-12-06 DIAGNOSIS — R293 Abnormal posture: Secondary | ICD-10-CM | POA: Diagnosis not present

## 2016-12-06 DIAGNOSIS — M25672 Stiffness of left ankle, not elsewhere classified: Secondary | ICD-10-CM | POA: Diagnosis not present

## 2016-12-06 DIAGNOSIS — G5793 Unspecified mononeuropathy of bilateral lower limbs: Secondary | ICD-10-CM | POA: Diagnosis not present

## 2016-12-13 DIAGNOSIS — Z89431 Acquired absence of right foot: Secondary | ICD-10-CM | POA: Diagnosis not present

## 2016-12-13 DIAGNOSIS — G6289 Other specified polyneuropathies: Secondary | ICD-10-CM | POA: Diagnosis not present

## 2016-12-13 DIAGNOSIS — L84 Corns and callosities: Secondary | ICD-10-CM | POA: Diagnosis not present

## 2016-12-13 DIAGNOSIS — S9031XS Contusion of right foot, sequela: Secondary | ICD-10-CM | POA: Diagnosis not present

## 2016-12-14 DIAGNOSIS — Z89431 Acquired absence of right foot: Secondary | ICD-10-CM | POA: Diagnosis not present

## 2016-12-14 DIAGNOSIS — R293 Abnormal posture: Secondary | ICD-10-CM | POA: Diagnosis not present

## 2016-12-14 DIAGNOSIS — G5793 Unspecified mononeuropathy of bilateral lower limbs: Secondary | ICD-10-CM | POA: Diagnosis not present

## 2016-12-14 DIAGNOSIS — M25672 Stiffness of left ankle, not elsewhere classified: Secondary | ICD-10-CM | POA: Diagnosis not present

## 2016-12-14 DIAGNOSIS — M25671 Stiffness of right ankle, not elsewhere classified: Secondary | ICD-10-CM | POA: Diagnosis not present

## 2016-12-14 DIAGNOSIS — Z4789 Encounter for other orthopedic aftercare: Secondary | ICD-10-CM | POA: Diagnosis not present

## 2016-12-14 DIAGNOSIS — R269 Unspecified abnormalities of gait and mobility: Secondary | ICD-10-CM | POA: Diagnosis not present

## 2016-12-21 DIAGNOSIS — R293 Abnormal posture: Secondary | ICD-10-CM | POA: Diagnosis not present

## 2016-12-21 DIAGNOSIS — M25672 Stiffness of left ankle, not elsewhere classified: Secondary | ICD-10-CM | POA: Diagnosis not present

## 2016-12-21 DIAGNOSIS — G5793 Unspecified mononeuropathy of bilateral lower limbs: Secondary | ICD-10-CM | POA: Diagnosis not present

## 2016-12-21 DIAGNOSIS — M25671 Stiffness of right ankle, not elsewhere classified: Secondary | ICD-10-CM | POA: Diagnosis not present

## 2016-12-21 DIAGNOSIS — R269 Unspecified abnormalities of gait and mobility: Secondary | ICD-10-CM | POA: Diagnosis not present

## 2016-12-21 DIAGNOSIS — Z89431 Acquired absence of right foot: Secondary | ICD-10-CM | POA: Diagnosis not present

## 2016-12-21 DIAGNOSIS — Z4789 Encounter for other orthopedic aftercare: Secondary | ICD-10-CM | POA: Diagnosis not present

## 2016-12-28 DIAGNOSIS — M25672 Stiffness of left ankle, not elsewhere classified: Secondary | ICD-10-CM | POA: Diagnosis not present

## 2016-12-28 DIAGNOSIS — R269 Unspecified abnormalities of gait and mobility: Secondary | ICD-10-CM | POA: Diagnosis not present

## 2016-12-28 DIAGNOSIS — M25671 Stiffness of right ankle, not elsewhere classified: Secondary | ICD-10-CM | POA: Diagnosis not present

## 2016-12-28 DIAGNOSIS — Z4789 Encounter for other orthopedic aftercare: Secondary | ICD-10-CM | POA: Diagnosis not present

## 2016-12-28 DIAGNOSIS — Z89431 Acquired absence of right foot: Secondary | ICD-10-CM | POA: Diagnosis not present

## 2016-12-28 DIAGNOSIS — R293 Abnormal posture: Secondary | ICD-10-CM | POA: Diagnosis not present

## 2016-12-28 DIAGNOSIS — G5793 Unspecified mononeuropathy of bilateral lower limbs: Secondary | ICD-10-CM | POA: Diagnosis not present

## 2017-01-05 DIAGNOSIS — Z89431 Acquired absence of right foot: Secondary | ICD-10-CM | POA: Diagnosis not present

## 2017-01-05 DIAGNOSIS — L97511 Non-pressure chronic ulcer of other part of right foot limited to breakdown of skin: Secondary | ICD-10-CM | POA: Diagnosis not present

## 2017-01-05 DIAGNOSIS — R269 Unspecified abnormalities of gait and mobility: Secondary | ICD-10-CM | POA: Diagnosis not present

## 2017-01-05 DIAGNOSIS — L84 Corns and callosities: Secondary | ICD-10-CM | POA: Diagnosis not present

## 2017-01-05 DIAGNOSIS — E11621 Type 2 diabetes mellitus with foot ulcer: Secondary | ICD-10-CM | POA: Diagnosis not present

## 2017-01-05 DIAGNOSIS — Z4789 Encounter for other orthopedic aftercare: Secondary | ICD-10-CM | POA: Diagnosis not present

## 2017-01-05 DIAGNOSIS — M25672 Stiffness of left ankle, not elsewhere classified: Secondary | ICD-10-CM | POA: Diagnosis not present

## 2017-01-05 DIAGNOSIS — R293 Abnormal posture: Secondary | ICD-10-CM | POA: Diagnosis not present

## 2017-01-05 DIAGNOSIS — M25671 Stiffness of right ankle, not elsewhere classified: Secondary | ICD-10-CM | POA: Diagnosis not present

## 2017-01-05 DIAGNOSIS — G5793 Unspecified mononeuropathy of bilateral lower limbs: Secondary | ICD-10-CM | POA: Diagnosis not present

## 2017-01-05 DIAGNOSIS — Z6834 Body mass index (BMI) 34.0-34.9, adult: Secondary | ICD-10-CM | POA: Diagnosis not present

## 2017-01-05 DIAGNOSIS — I739 Peripheral vascular disease, unspecified: Secondary | ICD-10-CM | POA: Diagnosis not present

## 2017-01-20 DIAGNOSIS — R269 Unspecified abnormalities of gait and mobility: Secondary | ICD-10-CM | POA: Diagnosis not present

## 2017-01-20 DIAGNOSIS — R293 Abnormal posture: Secondary | ICD-10-CM | POA: Diagnosis not present

## 2017-01-20 DIAGNOSIS — M25672 Stiffness of left ankle, not elsewhere classified: Secondary | ICD-10-CM | POA: Diagnosis not present

## 2017-01-20 DIAGNOSIS — Z89431 Acquired absence of right foot: Secondary | ICD-10-CM | POA: Diagnosis not present

## 2017-01-20 DIAGNOSIS — M25671 Stiffness of right ankle, not elsewhere classified: Secondary | ICD-10-CM | POA: Diagnosis not present

## 2017-01-20 DIAGNOSIS — G5793 Unspecified mononeuropathy of bilateral lower limbs: Secondary | ICD-10-CM | POA: Diagnosis not present

## 2017-01-20 DIAGNOSIS — Z4789 Encounter for other orthopedic aftercare: Secondary | ICD-10-CM | POA: Diagnosis not present

## 2017-01-27 DIAGNOSIS — G5793 Unspecified mononeuropathy of bilateral lower limbs: Secondary | ICD-10-CM | POA: Diagnosis not present

## 2017-01-27 DIAGNOSIS — M25672 Stiffness of left ankle, not elsewhere classified: Secondary | ICD-10-CM | POA: Diagnosis not present

## 2017-01-27 DIAGNOSIS — E11621 Type 2 diabetes mellitus with foot ulcer: Secondary | ICD-10-CM | POA: Diagnosis not present

## 2017-01-27 DIAGNOSIS — R293 Abnormal posture: Secondary | ICD-10-CM | POA: Diagnosis not present

## 2017-01-27 DIAGNOSIS — M25671 Stiffness of right ankle, not elsewhere classified: Secondary | ICD-10-CM | POA: Diagnosis not present

## 2017-01-27 DIAGNOSIS — S91309A Unspecified open wound, unspecified foot, initial encounter: Secondary | ICD-10-CM | POA: Diagnosis not present

## 2017-01-27 DIAGNOSIS — G6289 Other specified polyneuropathies: Secondary | ICD-10-CM | POA: Diagnosis not present

## 2017-01-27 DIAGNOSIS — Z89431 Acquired absence of right foot: Secondary | ICD-10-CM | POA: Diagnosis not present

## 2017-01-27 DIAGNOSIS — E08621 Diabetes mellitus due to underlying condition with foot ulcer: Secondary | ICD-10-CM | POA: Diagnosis not present

## 2017-01-27 DIAGNOSIS — R269 Unspecified abnormalities of gait and mobility: Secondary | ICD-10-CM | POA: Diagnosis not present

## 2017-01-27 DIAGNOSIS — Z4789 Encounter for other orthopedic aftercare: Secondary | ICD-10-CM | POA: Diagnosis not present

## 2017-01-27 DIAGNOSIS — L97511 Non-pressure chronic ulcer of other part of right foot limited to breakdown of skin: Secondary | ICD-10-CM | POA: Diagnosis not present

## 2017-02-03 DIAGNOSIS — E08621 Diabetes mellitus due to underlying condition with foot ulcer: Secondary | ICD-10-CM | POA: Diagnosis not present

## 2017-02-03 DIAGNOSIS — Z4789 Encounter for other orthopedic aftercare: Secondary | ICD-10-CM | POA: Diagnosis not present

## 2017-02-03 DIAGNOSIS — L97511 Non-pressure chronic ulcer of other part of right foot limited to breakdown of skin: Secondary | ICD-10-CM | POA: Diagnosis not present

## 2017-02-03 DIAGNOSIS — R293 Abnormal posture: Secondary | ICD-10-CM | POA: Diagnosis not present

## 2017-02-03 DIAGNOSIS — M25671 Stiffness of right ankle, not elsewhere classified: Secondary | ICD-10-CM | POA: Diagnosis not present

## 2017-02-03 DIAGNOSIS — E11621 Type 2 diabetes mellitus with foot ulcer: Secondary | ICD-10-CM | POA: Diagnosis not present

## 2017-02-03 DIAGNOSIS — G6289 Other specified polyneuropathies: Secondary | ICD-10-CM | POA: Diagnosis not present

## 2017-02-03 DIAGNOSIS — R269 Unspecified abnormalities of gait and mobility: Secondary | ICD-10-CM | POA: Diagnosis not present

## 2017-02-03 DIAGNOSIS — Z89431 Acquired absence of right foot: Secondary | ICD-10-CM | POA: Diagnosis not present

## 2017-02-03 DIAGNOSIS — M25672 Stiffness of left ankle, not elsewhere classified: Secondary | ICD-10-CM | POA: Diagnosis not present

## 2017-02-03 DIAGNOSIS — G5793 Unspecified mononeuropathy of bilateral lower limbs: Secondary | ICD-10-CM | POA: Diagnosis not present

## 2017-02-10 DIAGNOSIS — E11621 Type 2 diabetes mellitus with foot ulcer: Secondary | ICD-10-CM | POA: Diagnosis not present

## 2017-02-10 DIAGNOSIS — E1159 Type 2 diabetes mellitus with other circulatory complications: Secondary | ICD-10-CM | POA: Diagnosis not present

## 2017-02-10 DIAGNOSIS — L97412 Non-pressure chronic ulcer of right heel and midfoot with fat layer exposed: Secondary | ICD-10-CM | POA: Diagnosis not present

## 2017-02-10 DIAGNOSIS — S91309A Unspecified open wound, unspecified foot, initial encounter: Secondary | ICD-10-CM | POA: Diagnosis not present

## 2017-02-23 DIAGNOSIS — E11621 Type 2 diabetes mellitus with foot ulcer: Secondary | ICD-10-CM | POA: Diagnosis not present

## 2017-02-23 DIAGNOSIS — L97412 Non-pressure chronic ulcer of right heel and midfoot with fat layer exposed: Secondary | ICD-10-CM | POA: Diagnosis not present

## 2017-03-10 DIAGNOSIS — E11621 Type 2 diabetes mellitus with foot ulcer: Secondary | ICD-10-CM | POA: Diagnosis not present

## 2017-03-10 DIAGNOSIS — S91309A Unspecified open wound, unspecified foot, initial encounter: Secondary | ICD-10-CM | POA: Diagnosis not present

## 2017-03-10 DIAGNOSIS — Z89431 Acquired absence of right foot: Secondary | ICD-10-CM | POA: Diagnosis not present

## 2017-03-10 DIAGNOSIS — L97412 Non-pressure chronic ulcer of right heel and midfoot with fat layer exposed: Secondary | ICD-10-CM | POA: Diagnosis not present

## 2017-03-31 DIAGNOSIS — M869 Osteomyelitis, unspecified: Secondary | ICD-10-CM | POA: Diagnosis not present

## 2017-03-31 DIAGNOSIS — E11621 Type 2 diabetes mellitus with foot ulcer: Secondary | ICD-10-CM | POA: Diagnosis not present

## 2017-03-31 DIAGNOSIS — L97512 Non-pressure chronic ulcer of other part of right foot with fat layer exposed: Secondary | ICD-10-CM | POA: Diagnosis not present

## 2017-03-31 DIAGNOSIS — Z89421 Acquired absence of other right toe(s): Secondary | ICD-10-CM | POA: Diagnosis not present

## 2017-03-31 DIAGNOSIS — D582 Other hemoglobinopathies: Secondary | ICD-10-CM | POA: Diagnosis not present

## 2017-03-31 DIAGNOSIS — E785 Hyperlipidemia, unspecified: Secondary | ICD-10-CM | POA: Diagnosis not present

## 2017-03-31 DIAGNOSIS — J984 Other disorders of lung: Secondary | ICD-10-CM | POA: Diagnosis not present

## 2017-03-31 DIAGNOSIS — M79604 Pain in right leg: Secondary | ICD-10-CM | POA: Diagnosis not present

## 2017-03-31 DIAGNOSIS — I517 Cardiomegaly: Secondary | ICD-10-CM | POA: Diagnosis not present

## 2017-03-31 DIAGNOSIS — R0989 Other specified symptoms and signs involving the circulatory and respiratory systems: Secondary | ICD-10-CM | POA: Diagnosis not present

## 2017-03-31 DIAGNOSIS — R0600 Dyspnea, unspecified: Secondary | ICD-10-CM | POA: Diagnosis not present

## 2017-03-31 DIAGNOSIS — G629 Polyneuropathy, unspecified: Secondary | ICD-10-CM | POA: Diagnosis not present

## 2017-03-31 DIAGNOSIS — L02611 Cutaneous abscess of right foot: Secondary | ICD-10-CM | POA: Diagnosis not present

## 2017-03-31 DIAGNOSIS — E1169 Type 2 diabetes mellitus with other specified complication: Secondary | ICD-10-CM | POA: Diagnosis not present

## 2017-03-31 DIAGNOSIS — Z89411 Acquired absence of right great toe: Secondary | ICD-10-CM | POA: Diagnosis not present

## 2017-03-31 DIAGNOSIS — E1142 Type 2 diabetes mellitus with diabetic polyneuropathy: Secondary | ICD-10-CM | POA: Diagnosis not present

## 2017-03-31 DIAGNOSIS — L97519 Non-pressure chronic ulcer of other part of right foot with unspecified severity: Secondary | ICD-10-CM | POA: Diagnosis not present

## 2017-03-31 DIAGNOSIS — Z7982 Long term (current) use of aspirin: Secondary | ICD-10-CM | POA: Diagnosis not present

## 2017-03-31 DIAGNOSIS — M86171 Other acute osteomyelitis, right ankle and foot: Secondary | ICD-10-CM | POA: Diagnosis not present

## 2017-03-31 DIAGNOSIS — G4733 Obstructive sleep apnea (adult) (pediatric): Secondary | ICD-10-CM | POA: Diagnosis not present

## 2017-03-31 DIAGNOSIS — R0602 Shortness of breath: Secondary | ICD-10-CM | POA: Diagnosis not present

## 2017-03-31 DIAGNOSIS — I330 Acute and subacute infective endocarditis: Secondary | ICD-10-CM | POA: Diagnosis not present

## 2017-03-31 DIAGNOSIS — B0052 Herpesviral keratitis: Secondary | ICD-10-CM | POA: Diagnosis not present

## 2017-03-31 DIAGNOSIS — T8189XA Other complications of procedures, not elsewhere classified, initial encounter: Secondary | ICD-10-CM | POA: Diagnosis not present

## 2017-03-31 DIAGNOSIS — M868X7 Other osteomyelitis, ankle and foot: Secondary | ICD-10-CM | POA: Diagnosis not present

## 2017-03-31 DIAGNOSIS — M7989 Other specified soft tissue disorders: Secondary | ICD-10-CM | POA: Diagnosis not present

## 2017-03-31 DIAGNOSIS — B9561 Methicillin susceptible Staphylococcus aureus infection as the cause of diseases classified elsewhere: Secondary | ICD-10-CM | POA: Diagnosis not present

## 2017-03-31 DIAGNOSIS — L97419 Non-pressure chronic ulcer of right heel and midfoot with unspecified severity: Secondary | ICD-10-CM | POA: Diagnosis not present

## 2017-03-31 DIAGNOSIS — L03115 Cellulitis of right lower limb: Secondary | ICD-10-CM | POA: Diagnosis not present

## 2017-03-31 DIAGNOSIS — M8610 Other acute osteomyelitis, unspecified site: Secondary | ICD-10-CM | POA: Diagnosis not present

## 2017-03-31 DIAGNOSIS — L97414 Non-pressure chronic ulcer of right heel and midfoot with necrosis of bone: Secondary | ICD-10-CM | POA: Diagnosis not present

## 2017-03-31 DIAGNOSIS — R7881 Bacteremia: Secondary | ICD-10-CM | POA: Diagnosis not present

## 2017-03-31 DIAGNOSIS — E669 Obesity, unspecified: Secondary | ICD-10-CM | POA: Diagnosis not present

## 2017-03-31 DIAGNOSIS — R509 Fever, unspecified: Secondary | ICD-10-CM | POA: Diagnosis not present

## 2017-03-31 DIAGNOSIS — M79671 Pain in right foot: Secondary | ICD-10-CM | POA: Diagnosis not present

## 2017-04-01 DIAGNOSIS — R7881 Bacteremia: Secondary | ICD-10-CM | POA: Diagnosis not present

## 2017-04-01 DIAGNOSIS — L97519 Non-pressure chronic ulcer of other part of right foot with unspecified severity: Secondary | ICD-10-CM | POA: Diagnosis not present

## 2017-04-01 DIAGNOSIS — L03115 Cellulitis of right lower limb: Secondary | ICD-10-CM | POA: Diagnosis not present

## 2017-04-01 DIAGNOSIS — M86171 Other acute osteomyelitis, right ankle and foot: Secondary | ICD-10-CM | POA: Diagnosis not present

## 2017-04-01 DIAGNOSIS — E11621 Type 2 diabetes mellitus with foot ulcer: Secondary | ICD-10-CM | POA: Diagnosis not present

## 2017-04-02 DIAGNOSIS — M86171 Other acute osteomyelitis, right ankle and foot: Secondary | ICD-10-CM | POA: Diagnosis not present

## 2017-04-02 DIAGNOSIS — L97512 Non-pressure chronic ulcer of other part of right foot with fat layer exposed: Secondary | ICD-10-CM | POA: Diagnosis not present

## 2017-04-02 DIAGNOSIS — E11621 Type 2 diabetes mellitus with foot ulcer: Secondary | ICD-10-CM | POA: Diagnosis not present

## 2017-04-02 DIAGNOSIS — R7881 Bacteremia: Secondary | ICD-10-CM | POA: Diagnosis not present

## 2017-04-02 DIAGNOSIS — E1169 Type 2 diabetes mellitus with other specified complication: Secondary | ICD-10-CM | POA: Diagnosis not present

## 2017-04-02 DIAGNOSIS — R0600 Dyspnea, unspecified: Secondary | ICD-10-CM | POA: Diagnosis not present

## 2017-04-02 DIAGNOSIS — L03115 Cellulitis of right lower limb: Secondary | ICD-10-CM | POA: Diagnosis not present

## 2017-04-03 DIAGNOSIS — M86171 Other acute osteomyelitis, right ankle and foot: Secondary | ICD-10-CM | POA: Diagnosis not present

## 2017-04-03 DIAGNOSIS — I330 Acute and subacute infective endocarditis: Secondary | ICD-10-CM | POA: Diagnosis not present

## 2017-04-03 DIAGNOSIS — L03115 Cellulitis of right lower limb: Secondary | ICD-10-CM | POA: Diagnosis not present

## 2017-04-03 DIAGNOSIS — B9561 Methicillin susceptible Staphylococcus aureus infection as the cause of diseases classified elsewhere: Secondary | ICD-10-CM | POA: Diagnosis not present

## 2017-04-03 DIAGNOSIS — R7881 Bacteremia: Secondary | ICD-10-CM | POA: Diagnosis not present

## 2017-04-03 DIAGNOSIS — L97519 Non-pressure chronic ulcer of other part of right foot with unspecified severity: Secondary | ICD-10-CM | POA: Diagnosis not present

## 2017-04-04 DIAGNOSIS — M8610 Other acute osteomyelitis, unspecified site: Secondary | ICD-10-CM | POA: Diagnosis not present

## 2017-04-04 DIAGNOSIS — M86171 Other acute osteomyelitis, right ankle and foot: Secondary | ICD-10-CM | POA: Diagnosis not present

## 2017-04-04 DIAGNOSIS — L03115 Cellulitis of right lower limb: Secondary | ICD-10-CM | POA: Diagnosis not present

## 2017-04-04 DIAGNOSIS — E1169 Type 2 diabetes mellitus with other specified complication: Secondary | ICD-10-CM | POA: Diagnosis not present

## 2017-04-04 DIAGNOSIS — Z89421 Acquired absence of other right toe(s): Secondary | ICD-10-CM | POA: Diagnosis not present

## 2017-04-04 DIAGNOSIS — L02611 Cutaneous abscess of right foot: Secondary | ICD-10-CM | POA: Diagnosis not present

## 2017-04-04 DIAGNOSIS — M869 Osteomyelitis, unspecified: Secondary | ICD-10-CM | POA: Diagnosis not present

## 2017-04-04 DIAGNOSIS — Z89411 Acquired absence of right great toe: Secondary | ICD-10-CM | POA: Diagnosis not present

## 2017-04-04 DIAGNOSIS — B9561 Methicillin susceptible Staphylococcus aureus infection as the cause of diseases classified elsewhere: Secondary | ICD-10-CM | POA: Diagnosis not present

## 2017-04-04 DIAGNOSIS — L97519 Non-pressure chronic ulcer of other part of right foot with unspecified severity: Secondary | ICD-10-CM | POA: Diagnosis not present

## 2017-04-04 DIAGNOSIS — R7881 Bacteremia: Secondary | ICD-10-CM | POA: Diagnosis not present

## 2017-04-05 DIAGNOSIS — R7881 Bacteremia: Secondary | ICD-10-CM | POA: Diagnosis not present

## 2017-04-05 DIAGNOSIS — L03115 Cellulitis of right lower limb: Secondary | ICD-10-CM | POA: Diagnosis not present

## 2017-04-05 DIAGNOSIS — M86171 Other acute osteomyelitis, right ankle and foot: Secondary | ICD-10-CM | POA: Diagnosis not present

## 2017-04-05 DIAGNOSIS — B9561 Methicillin susceptible Staphylococcus aureus infection as the cause of diseases classified elsewhere: Secondary | ICD-10-CM | POA: Diagnosis not present

## 2017-04-06 ENCOUNTER — Telehealth: Payer: Self-pay | Admitting: Internal Medicine

## 2017-04-06 NOTE — Telephone Encounter (Signed)
[name] from [place] called yesterday and stated that [facility] put in a referral for home health nursing. Pt has a wound vac. [facility] stated to [facility] that Dr. Derrel Nip was going to put in the orders. Please advise, thank you!  Call [facility] @ [phone]

## 2017-04-06 NOTE — Telephone Encounter (Signed)
Spoke with Clarise Cruz from [place] and informed her of Dr. Lupita Dawn message. She stated that she would let [facility] know and get it taken care of.

## 2017-04-06 NOTE — Telephone Encounter (Signed)
LMTCB with Clarise Cruz from [place].

## 2017-04-06 NOTE — Telephone Encounter (Signed)
Please advise 

## 2017-04-06 NOTE — Telephone Encounter (Signed)
If the order for home health nursing has been placed by [facility], then they need to put the orders in the for the wound vacm  Since I have not seen patient

## 2017-04-07 DIAGNOSIS — L03115 Cellulitis of right lower limb: Secondary | ICD-10-CM | POA: Diagnosis not present

## 2017-04-07 DIAGNOSIS — T8189XA Other complications of procedures, not elsewhere classified, initial encounter: Secondary | ICD-10-CM | POA: Diagnosis not present

## 2017-04-07 DIAGNOSIS — R7881 Bacteremia: Secondary | ICD-10-CM | POA: Diagnosis not present

## 2017-04-07 DIAGNOSIS — B9561 Methicillin susceptible Staphylococcus aureus infection as the cause of diseases classified elsewhere: Secondary | ICD-10-CM | POA: Diagnosis not present

## 2017-04-07 DIAGNOSIS — M86171 Other acute osteomyelitis, right ankle and foot: Secondary | ICD-10-CM | POA: Diagnosis not present

## 2017-04-08 DIAGNOSIS — M86171 Other acute osteomyelitis, right ankle and foot: Secondary | ICD-10-CM | POA: Diagnosis not present

## 2017-04-08 DIAGNOSIS — M869 Osteomyelitis, unspecified: Secondary | ICD-10-CM | POA: Diagnosis not present

## 2017-04-08 DIAGNOSIS — B9561 Methicillin susceptible Staphylococcus aureus infection as the cause of diseases classified elsewhere: Secondary | ICD-10-CM | POA: Diagnosis not present

## 2017-04-08 DIAGNOSIS — E1169 Type 2 diabetes mellitus with other specified complication: Secondary | ICD-10-CM | POA: Diagnosis not present

## 2017-04-08 DIAGNOSIS — R7881 Bacteremia: Secondary | ICD-10-CM | POA: Diagnosis not present

## 2017-04-08 DIAGNOSIS — L03115 Cellulitis of right lower limb: Secondary | ICD-10-CM | POA: Diagnosis not present

## 2017-04-08 LAB — BASIC METABOLIC PANEL
BUN: 13 (ref 4–21)
Creatinine: 0.8 (ref 0.6–1.3)
Potassium: 4.2 (ref 3.4–5.3)
Sodium: 138 (ref 137–147)

## 2017-04-08 LAB — CBC AND DIFFERENTIAL
HCT: 40 — AB (ref 41–53)
Hemoglobin: 13.4 — AB (ref 13.5–17.5)
Platelets: 532 — AB (ref 150–399)
WBC: 7.6

## 2017-04-08 LAB — HEPATIC FUNCTION PANEL
ALT: 26 (ref 10–40)
AST: 27 (ref 14–40)
Alkaline Phosphatase: 59 (ref 25–125)
Bilirubin, Total: 1.3

## 2017-04-08 LAB — POCT INR: INR: 1.3 — AB (ref 0.9–1.1)

## 2017-04-10 ENCOUNTER — Other Ambulatory Visit: Payer: Self-pay

## 2017-04-11 DIAGNOSIS — M869 Osteomyelitis, unspecified: Secondary | ICD-10-CM | POA: Diagnosis not present

## 2017-04-11 DIAGNOSIS — M86179 Other acute osteomyelitis, unspecified ankle and foot: Secondary | ICD-10-CM | POA: Diagnosis not present

## 2017-04-14 DIAGNOSIS — L97412 Non-pressure chronic ulcer of right heel and midfoot with fat layer exposed: Secondary | ICD-10-CM | POA: Diagnosis not present

## 2017-04-14 DIAGNOSIS — T8130XA Disruption of wound, unspecified, initial encounter: Secondary | ICD-10-CM | POA: Diagnosis not present

## 2017-04-14 DIAGNOSIS — M86171 Other acute osteomyelitis, right ankle and foot: Secondary | ICD-10-CM | POA: Diagnosis not present

## 2017-04-14 DIAGNOSIS — T8740 Infection of amputation stump, unspecified extremity: Secondary | ICD-10-CM | POA: Diagnosis not present

## 2017-04-16 DIAGNOSIS — M869 Osteomyelitis, unspecified: Secondary | ICD-10-CM | POA: Diagnosis not present

## 2017-04-18 DIAGNOSIS — M8580 Other specified disorders of bone density and structure, unspecified site: Secondary | ICD-10-CM | POA: Diagnosis not present

## 2017-04-18 DIAGNOSIS — L403 Pustulosis palmaris et plantaris: Secondary | ICD-10-CM | POA: Diagnosis not present

## 2017-04-18 DIAGNOSIS — L709 Acne, unspecified: Secondary | ICD-10-CM | POA: Diagnosis not present

## 2017-04-18 DIAGNOSIS — M659 Synovitis and tenosynovitis, unspecified: Secondary | ICD-10-CM | POA: Diagnosis not present

## 2017-04-18 DIAGNOSIS — M869 Osteomyelitis, unspecified: Secondary | ICD-10-CM | POA: Diagnosis not present

## 2017-04-21 DIAGNOSIS — M86171 Other acute osteomyelitis, right ankle and foot: Secondary | ICD-10-CM | POA: Diagnosis not present

## 2017-04-21 DIAGNOSIS — T8740 Infection of amputation stump, unspecified extremity: Secondary | ICD-10-CM | POA: Diagnosis not present

## 2017-04-21 DIAGNOSIS — T8130XA Disruption of wound, unspecified, initial encounter: Secondary | ICD-10-CM | POA: Diagnosis not present

## 2017-04-22 DIAGNOSIS — M869 Osteomyelitis, unspecified: Secondary | ICD-10-CM | POA: Diagnosis not present

## 2017-04-23 DIAGNOSIS — R7881 Bacteremia: Secondary | ICD-10-CM | POA: Diagnosis not present

## 2017-04-23 DIAGNOSIS — L03115 Cellulitis of right lower limb: Secondary | ICD-10-CM | POA: Diagnosis not present

## 2017-04-23 DIAGNOSIS — G4733 Obstructive sleep apnea (adult) (pediatric): Secondary | ICD-10-CM | POA: Diagnosis not present

## 2017-04-23 DIAGNOSIS — B0052 Herpesviral keratitis: Secondary | ICD-10-CM | POA: Diagnosis not present

## 2017-04-23 DIAGNOSIS — B9561 Methicillin susceptible Staphylococcus aureus infection as the cause of diseases classified elsewhere: Secondary | ICD-10-CM | POA: Diagnosis not present

## 2017-04-23 DIAGNOSIS — M86171 Other acute osteomyelitis, right ankle and foot: Secondary | ICD-10-CM | POA: Diagnosis not present

## 2017-04-23 DIAGNOSIS — E1142 Type 2 diabetes mellitus with diabetic polyneuropathy: Secondary | ICD-10-CM | POA: Diagnosis not present

## 2017-04-23 DIAGNOSIS — Z7982 Long term (current) use of aspirin: Secondary | ICD-10-CM | POA: Diagnosis not present

## 2017-04-23 DIAGNOSIS — Z452 Encounter for adjustment and management of vascular access device: Secondary | ICD-10-CM | POA: Diagnosis not present

## 2017-04-23 DIAGNOSIS — Z792 Long term (current) use of antibiotics: Secondary | ICD-10-CM | POA: Diagnosis not present

## 2017-04-23 DIAGNOSIS — E1169 Type 2 diabetes mellitus with other specified complication: Secondary | ICD-10-CM | POA: Diagnosis not present

## 2017-04-23 DIAGNOSIS — T8743 Infection of amputation stump, right lower extremity: Secondary | ICD-10-CM | POA: Diagnosis not present

## 2017-04-24 DIAGNOSIS — Z452 Encounter for adjustment and management of vascular access device: Secondary | ICD-10-CM | POA: Diagnosis not present

## 2017-04-24 DIAGNOSIS — M86171 Other acute osteomyelitis, right ankle and foot: Secondary | ICD-10-CM | POA: Diagnosis not present

## 2017-04-24 DIAGNOSIS — R7881 Bacteremia: Secondary | ICD-10-CM | POA: Diagnosis not present

## 2017-04-24 DIAGNOSIS — E1169 Type 2 diabetes mellitus with other specified complication: Secondary | ICD-10-CM | POA: Diagnosis not present

## 2017-04-24 DIAGNOSIS — E1142 Type 2 diabetes mellitus with diabetic polyneuropathy: Secondary | ICD-10-CM | POA: Diagnosis not present

## 2017-04-24 DIAGNOSIS — L03115 Cellulitis of right lower limb: Secondary | ICD-10-CM | POA: Diagnosis not present

## 2017-04-24 DIAGNOSIS — M8600 Acute hematogenous osteomyelitis, unspecified site: Secondary | ICD-10-CM | POA: Diagnosis not present

## 2017-04-24 DIAGNOSIS — T8743 Infection of amputation stump, right lower extremity: Secondary | ICD-10-CM | POA: Diagnosis not present

## 2017-04-24 DIAGNOSIS — Z792 Long term (current) use of antibiotics: Secondary | ICD-10-CM | POA: Diagnosis not present

## 2017-04-24 DIAGNOSIS — Z7982 Long term (current) use of aspirin: Secondary | ICD-10-CM | POA: Diagnosis not present

## 2017-04-24 DIAGNOSIS — B0052 Herpesviral keratitis: Secondary | ICD-10-CM | POA: Diagnosis not present

## 2017-04-24 DIAGNOSIS — B9561 Methicillin susceptible Staphylococcus aureus infection as the cause of diseases classified elsewhere: Secondary | ICD-10-CM | POA: Diagnosis not present

## 2017-04-24 DIAGNOSIS — G4733 Obstructive sleep apnea (adult) (pediatric): Secondary | ICD-10-CM | POA: Diagnosis not present

## 2017-04-26 DIAGNOSIS — R7881 Bacteremia: Secondary | ICD-10-CM | POA: Diagnosis not present

## 2017-04-26 DIAGNOSIS — Z792 Long term (current) use of antibiotics: Secondary | ICD-10-CM | POA: Diagnosis not present

## 2017-04-26 DIAGNOSIS — E1169 Type 2 diabetes mellitus with other specified complication: Secondary | ICD-10-CM | POA: Diagnosis not present

## 2017-04-26 DIAGNOSIS — Z7982 Long term (current) use of aspirin: Secondary | ICD-10-CM | POA: Diagnosis not present

## 2017-04-26 DIAGNOSIS — Z452 Encounter for adjustment and management of vascular access device: Secondary | ICD-10-CM | POA: Diagnosis not present

## 2017-04-26 DIAGNOSIS — B0052 Herpesviral keratitis: Secondary | ICD-10-CM | POA: Diagnosis not present

## 2017-04-26 DIAGNOSIS — E1142 Type 2 diabetes mellitus with diabetic polyneuropathy: Secondary | ICD-10-CM | POA: Diagnosis not present

## 2017-04-26 DIAGNOSIS — L03115 Cellulitis of right lower limb: Secondary | ICD-10-CM | POA: Diagnosis not present

## 2017-04-26 DIAGNOSIS — G4733 Obstructive sleep apnea (adult) (pediatric): Secondary | ICD-10-CM | POA: Diagnosis not present

## 2017-04-26 DIAGNOSIS — M86171 Other acute osteomyelitis, right ankle and foot: Secondary | ICD-10-CM | POA: Diagnosis not present

## 2017-04-26 DIAGNOSIS — B9561 Methicillin susceptible Staphylococcus aureus infection as the cause of diseases classified elsewhere: Secondary | ICD-10-CM | POA: Diagnosis not present

## 2017-04-26 DIAGNOSIS — T8743 Infection of amputation stump, right lower extremity: Secondary | ICD-10-CM | POA: Diagnosis not present

## 2017-04-28 DIAGNOSIS — E1142 Type 2 diabetes mellitus with diabetic polyneuropathy: Secondary | ICD-10-CM | POA: Diagnosis not present

## 2017-04-28 DIAGNOSIS — E1169 Type 2 diabetes mellitus with other specified complication: Secondary | ICD-10-CM | POA: Diagnosis not present

## 2017-04-28 DIAGNOSIS — B0052 Herpesviral keratitis: Secondary | ICD-10-CM | POA: Diagnosis not present

## 2017-04-28 DIAGNOSIS — B9561 Methicillin susceptible Staphylococcus aureus infection as the cause of diseases classified elsewhere: Secondary | ICD-10-CM | POA: Diagnosis not present

## 2017-04-28 DIAGNOSIS — Z452 Encounter for adjustment and management of vascular access device: Secondary | ICD-10-CM | POA: Diagnosis not present

## 2017-04-28 DIAGNOSIS — T8743 Infection of amputation stump, right lower extremity: Secondary | ICD-10-CM | POA: Diagnosis not present

## 2017-04-28 DIAGNOSIS — G4733 Obstructive sleep apnea (adult) (pediatric): Secondary | ICD-10-CM | POA: Diagnosis not present

## 2017-04-28 DIAGNOSIS — M869 Osteomyelitis, unspecified: Secondary | ICD-10-CM | POA: Diagnosis not present

## 2017-04-28 DIAGNOSIS — R7881 Bacteremia: Secondary | ICD-10-CM | POA: Diagnosis not present

## 2017-04-28 DIAGNOSIS — Z792 Long term (current) use of antibiotics: Secondary | ICD-10-CM | POA: Diagnosis not present

## 2017-04-28 DIAGNOSIS — L03115 Cellulitis of right lower limb: Secondary | ICD-10-CM | POA: Diagnosis not present

## 2017-04-28 DIAGNOSIS — Z7982 Long term (current) use of aspirin: Secondary | ICD-10-CM | POA: Diagnosis not present

## 2017-04-28 DIAGNOSIS — M86171 Other acute osteomyelitis, right ankle and foot: Secondary | ICD-10-CM | POA: Diagnosis not present

## 2017-04-29 DIAGNOSIS — T8189XA Other complications of procedures, not elsewhere classified, initial encounter: Secondary | ICD-10-CM | POA: Diagnosis not present

## 2017-05-01 ENCOUNTER — Other Ambulatory Visit
Admission: RE | Admit: 2017-05-01 | Discharge: 2017-05-01 | Disposition: A | Discharge status: Home / Self Care | Payer: BLUE CROSS/BLUE SHIELD | Source: Ambulatory Visit | Attending: Infectious Diseases | Admitting: Infectious Diseases

## 2017-05-01 DIAGNOSIS — B9561 Methicillin susceptible Staphylococcus aureus infection as the cause of diseases classified elsewhere: Secondary | ICD-10-CM | POA: Diagnosis not present

## 2017-05-01 DIAGNOSIS — Z7982 Long term (current) use of aspirin: Secondary | ICD-10-CM | POA: Diagnosis not present

## 2017-05-01 DIAGNOSIS — R7881 Bacteremia: Secondary | ICD-10-CM | POA: Diagnosis not present

## 2017-05-01 DIAGNOSIS — G4733 Obstructive sleep apnea (adult) (pediatric): Secondary | ICD-10-CM | POA: Diagnosis not present

## 2017-05-01 DIAGNOSIS — T8743 Infection of amputation stump, right lower extremity: Secondary | ICD-10-CM | POA: Diagnosis not present

## 2017-05-01 DIAGNOSIS — M86171 Other acute osteomyelitis, right ankle and foot: Secondary | ICD-10-CM | POA: Diagnosis not present

## 2017-05-01 DIAGNOSIS — E1142 Type 2 diabetes mellitus with diabetic polyneuropathy: Secondary | ICD-10-CM | POA: Diagnosis not present

## 2017-05-01 DIAGNOSIS — E1169 Type 2 diabetes mellitus with other specified complication: Secondary | ICD-10-CM | POA: Diagnosis not present

## 2017-05-01 DIAGNOSIS — L03115 Cellulitis of right lower limb: Secondary | ICD-10-CM | POA: Diagnosis not present

## 2017-05-01 DIAGNOSIS — B0052 Herpesviral keratitis: Secondary | ICD-10-CM | POA: Diagnosis not present

## 2017-05-01 DIAGNOSIS — Z452 Encounter for adjustment and management of vascular access device: Secondary | ICD-10-CM | POA: Diagnosis not present

## 2017-05-01 DIAGNOSIS — Z792 Long term (current) use of antibiotics: Secondary | ICD-10-CM | POA: Diagnosis not present

## 2017-05-03 DIAGNOSIS — E1142 Type 2 diabetes mellitus with diabetic polyneuropathy: Secondary | ICD-10-CM | POA: Diagnosis not present

## 2017-05-03 DIAGNOSIS — Z452 Encounter for adjustment and management of vascular access device: Secondary | ICD-10-CM | POA: Diagnosis not present

## 2017-05-03 DIAGNOSIS — B0052 Herpesviral keratitis: Secondary | ICD-10-CM | POA: Diagnosis not present

## 2017-05-03 DIAGNOSIS — G4733 Obstructive sleep apnea (adult) (pediatric): Secondary | ICD-10-CM | POA: Diagnosis not present

## 2017-05-03 DIAGNOSIS — R7881 Bacteremia: Secondary | ICD-10-CM | POA: Diagnosis not present

## 2017-05-03 DIAGNOSIS — T8743 Infection of amputation stump, right lower extremity: Secondary | ICD-10-CM | POA: Diagnosis not present

## 2017-05-03 DIAGNOSIS — E1169 Type 2 diabetes mellitus with other specified complication: Secondary | ICD-10-CM | POA: Diagnosis not present

## 2017-05-03 DIAGNOSIS — Z7982 Long term (current) use of aspirin: Secondary | ICD-10-CM | POA: Diagnosis not present

## 2017-05-03 DIAGNOSIS — L03115 Cellulitis of right lower limb: Secondary | ICD-10-CM | POA: Diagnosis not present

## 2017-05-03 DIAGNOSIS — M86171 Other acute osteomyelitis, right ankle and foot: Secondary | ICD-10-CM | POA: Diagnosis not present

## 2017-05-03 DIAGNOSIS — B9561 Methicillin susceptible Staphylococcus aureus infection as the cause of diseases classified elsewhere: Secondary | ICD-10-CM | POA: Diagnosis not present

## 2017-05-03 DIAGNOSIS — Z792 Long term (current) use of antibiotics: Secondary | ICD-10-CM | POA: Diagnosis not present

## 2017-05-04 DIAGNOSIS — M869 Osteomyelitis, unspecified: Secondary | ICD-10-CM | POA: Diagnosis not present

## 2017-05-05 DIAGNOSIS — T8740 Infection of amputation stump, unspecified extremity: Secondary | ICD-10-CM | POA: Diagnosis not present

## 2017-05-05 DIAGNOSIS — T8130XA Disruption of wound, unspecified, initial encounter: Secondary | ICD-10-CM | POA: Diagnosis not present

## 2017-05-05 DIAGNOSIS — M86171 Other acute osteomyelitis, right ankle and foot: Secondary | ICD-10-CM | POA: Diagnosis not present

## 2017-05-05 DIAGNOSIS — L03115 Cellulitis of right lower limb: Secondary | ICD-10-CM | POA: Diagnosis not present

## 2017-05-05 DIAGNOSIS — L97412 Non-pressure chronic ulcer of right heel and midfoot with fat layer exposed: Secondary | ICD-10-CM | POA: Diagnosis not present

## 2017-05-05 DIAGNOSIS — T8189XA Other complications of procedures, not elsewhere classified, initial encounter: Secondary | ICD-10-CM | POA: Diagnosis not present

## 2017-05-06 DIAGNOSIS — T8189XA Other complications of procedures, not elsewhere classified, initial encounter: Secondary | ICD-10-CM | POA: Diagnosis not present

## 2017-05-07 ENCOUNTER — Other Ambulatory Visit: Payer: Self-pay | Admitting: Internal Medicine

## 2017-05-07 DIAGNOSIS — T8189XA Other complications of procedures, not elsewhere classified, initial encounter: Secondary | ICD-10-CM | POA: Diagnosis not present

## 2017-05-08 DIAGNOSIS — Z792 Long term (current) use of antibiotics: Secondary | ICD-10-CM | POA: Diagnosis not present

## 2017-05-08 DIAGNOSIS — L03115 Cellulitis of right lower limb: Secondary | ICD-10-CM | POA: Diagnosis not present

## 2017-05-08 DIAGNOSIS — D309 Benign neoplasm of urinary organ, unspecified: Secondary | ICD-10-CM | POA: Diagnosis not present

## 2017-05-08 DIAGNOSIS — E1169 Type 2 diabetes mellitus with other specified complication: Secondary | ICD-10-CM | POA: Diagnosis not present

## 2017-05-08 DIAGNOSIS — T8743 Infection of amputation stump, right lower extremity: Secondary | ICD-10-CM | POA: Diagnosis not present

## 2017-05-08 DIAGNOSIS — R7881 Bacteremia: Secondary | ICD-10-CM | POA: Diagnosis not present

## 2017-05-08 DIAGNOSIS — G4733 Obstructive sleep apnea (adult) (pediatric): Secondary | ICD-10-CM | POA: Diagnosis not present

## 2017-05-08 DIAGNOSIS — Z7982 Long term (current) use of aspirin: Secondary | ICD-10-CM | POA: Diagnosis not present

## 2017-05-08 DIAGNOSIS — Z452 Encounter for adjustment and management of vascular access device: Secondary | ICD-10-CM | POA: Diagnosis not present

## 2017-05-08 DIAGNOSIS — E1142 Type 2 diabetes mellitus with diabetic polyneuropathy: Secondary | ICD-10-CM | POA: Diagnosis not present

## 2017-05-08 DIAGNOSIS — M86171 Other acute osteomyelitis, right ankle and foot: Secondary | ICD-10-CM | POA: Diagnosis not present

## 2017-05-08 DIAGNOSIS — B9561 Methicillin susceptible Staphylococcus aureus infection as the cause of diseases classified elsewhere: Secondary | ICD-10-CM | POA: Diagnosis not present

## 2017-05-08 DIAGNOSIS — T8189XA Other complications of procedures, not elsewhere classified, initial encounter: Secondary | ICD-10-CM | POA: Diagnosis not present

## 2017-05-08 DIAGNOSIS — B0052 Herpesviral keratitis: Secondary | ICD-10-CM | POA: Diagnosis not present

## 2017-05-09 DIAGNOSIS — T8189XA Other complications of procedures, not elsewhere classified, initial encounter: Secondary | ICD-10-CM | POA: Diagnosis not present

## 2017-05-10 DIAGNOSIS — M86171 Other acute osteomyelitis, right ankle and foot: Secondary | ICD-10-CM | POA: Diagnosis not present

## 2017-05-10 DIAGNOSIS — B0052 Herpesviral keratitis: Secondary | ICD-10-CM | POA: Diagnosis not present

## 2017-05-10 DIAGNOSIS — Z792 Long term (current) use of antibiotics: Secondary | ICD-10-CM | POA: Diagnosis not present

## 2017-05-10 DIAGNOSIS — E1142 Type 2 diabetes mellitus with diabetic polyneuropathy: Secondary | ICD-10-CM | POA: Diagnosis not present

## 2017-05-10 DIAGNOSIS — Z452 Encounter for adjustment and management of vascular access device: Secondary | ICD-10-CM | POA: Diagnosis not present

## 2017-05-10 DIAGNOSIS — M869 Osteomyelitis, unspecified: Secondary | ICD-10-CM | POA: Diagnosis not present

## 2017-05-10 DIAGNOSIS — B9561 Methicillin susceptible Staphylococcus aureus infection as the cause of diseases classified elsewhere: Secondary | ICD-10-CM | POA: Diagnosis not present

## 2017-05-10 DIAGNOSIS — T8743 Infection of amputation stump, right lower extremity: Secondary | ICD-10-CM | POA: Diagnosis not present

## 2017-05-10 DIAGNOSIS — E1169 Type 2 diabetes mellitus with other specified complication: Secondary | ICD-10-CM | POA: Diagnosis not present

## 2017-05-10 DIAGNOSIS — T8189XA Other complications of procedures, not elsewhere classified, initial encounter: Secondary | ICD-10-CM | POA: Diagnosis not present

## 2017-05-10 DIAGNOSIS — Z7982 Long term (current) use of aspirin: Secondary | ICD-10-CM | POA: Diagnosis not present

## 2017-05-10 DIAGNOSIS — G4733 Obstructive sleep apnea (adult) (pediatric): Secondary | ICD-10-CM | POA: Diagnosis not present

## 2017-05-10 DIAGNOSIS — R7881 Bacteremia: Secondary | ICD-10-CM | POA: Diagnosis not present

## 2017-05-10 DIAGNOSIS — L03115 Cellulitis of right lower limb: Secondary | ICD-10-CM | POA: Diagnosis not present

## 2017-05-11 DIAGNOSIS — T8189XA Other complications of procedures, not elsewhere classified, initial encounter: Secondary | ICD-10-CM | POA: Diagnosis not present

## 2017-05-12 DIAGNOSIS — T8189XA Other complications of procedures, not elsewhere classified, initial encounter: Secondary | ICD-10-CM | POA: Diagnosis not present

## 2017-05-13 DIAGNOSIS — T8189XA Other complications of procedures, not elsewhere classified, initial encounter: Secondary | ICD-10-CM | POA: Diagnosis not present

## 2017-05-14 DIAGNOSIS — M869 Osteomyelitis, unspecified: Secondary | ICD-10-CM | POA: Diagnosis not present

## 2017-05-14 DIAGNOSIS — T8189XA Other complications of procedures, not elsewhere classified, initial encounter: Secondary | ICD-10-CM | POA: Diagnosis not present

## 2017-05-14 DIAGNOSIS — Z6835 Body mass index (BMI) 35.0-35.9, adult: Secondary | ICD-10-CM | POA: Diagnosis not present

## 2017-05-15 DIAGNOSIS — M86171 Other acute osteomyelitis, right ankle and foot: Secondary | ICD-10-CM | POA: Diagnosis not present

## 2017-05-15 DIAGNOSIS — L03115 Cellulitis of right lower limb: Secondary | ICD-10-CM | POA: Diagnosis not present

## 2017-05-15 DIAGNOSIS — Z792 Long term (current) use of antibiotics: Secondary | ICD-10-CM | POA: Diagnosis not present

## 2017-05-15 DIAGNOSIS — R7881 Bacteremia: Secondary | ICD-10-CM | POA: Diagnosis not present

## 2017-05-15 DIAGNOSIS — G4733 Obstructive sleep apnea (adult) (pediatric): Secondary | ICD-10-CM | POA: Diagnosis not present

## 2017-05-15 DIAGNOSIS — E1169 Type 2 diabetes mellitus with other specified complication: Secondary | ICD-10-CM | POA: Diagnosis not present

## 2017-05-15 DIAGNOSIS — B9561 Methicillin susceptible Staphylococcus aureus infection as the cause of diseases classified elsewhere: Secondary | ICD-10-CM | POA: Diagnosis not present

## 2017-05-15 DIAGNOSIS — Z452 Encounter for adjustment and management of vascular access device: Secondary | ICD-10-CM | POA: Diagnosis not present

## 2017-05-15 DIAGNOSIS — T8189XA Other complications of procedures, not elsewhere classified, initial encounter: Secondary | ICD-10-CM | POA: Diagnosis not present

## 2017-05-15 DIAGNOSIS — T8743 Infection of amputation stump, right lower extremity: Secondary | ICD-10-CM | POA: Diagnosis not present

## 2017-05-15 DIAGNOSIS — E1142 Type 2 diabetes mellitus with diabetic polyneuropathy: Secondary | ICD-10-CM | POA: Diagnosis not present

## 2017-05-15 DIAGNOSIS — B0052 Herpesviral keratitis: Secondary | ICD-10-CM | POA: Diagnosis not present

## 2017-05-15 DIAGNOSIS — Z7982 Long term (current) use of aspirin: Secondary | ICD-10-CM | POA: Diagnosis not present

## 2017-05-16 DIAGNOSIS — Z6835 Body mass index (BMI) 35.0-35.9, adult: Secondary | ICD-10-CM | POA: Diagnosis not present

## 2017-05-16 DIAGNOSIS — B0052 Herpesviral keratitis: Secondary | ICD-10-CM | POA: Diagnosis not present

## 2017-05-16 DIAGNOSIS — Z452 Encounter for adjustment and management of vascular access device: Secondary | ICD-10-CM | POA: Diagnosis not present

## 2017-05-16 DIAGNOSIS — T8189XA Other complications of procedures, not elsewhere classified, initial encounter: Secondary | ICD-10-CM | POA: Diagnosis not present

## 2017-05-16 DIAGNOSIS — T8743 Infection of amputation stump, right lower extremity: Secondary | ICD-10-CM | POA: Diagnosis not present

## 2017-05-16 DIAGNOSIS — B9561 Methicillin susceptible Staphylococcus aureus infection as the cause of diseases classified elsewhere: Secondary | ICD-10-CM | POA: Diagnosis not present

## 2017-05-16 DIAGNOSIS — L03115 Cellulitis of right lower limb: Secondary | ICD-10-CM | POA: Diagnosis not present

## 2017-05-16 DIAGNOSIS — M86171 Other acute osteomyelitis, right ankle and foot: Secondary | ICD-10-CM | POA: Diagnosis not present

## 2017-05-16 DIAGNOSIS — Z792 Long term (current) use of antibiotics: Secondary | ICD-10-CM | POA: Diagnosis not present

## 2017-05-16 DIAGNOSIS — R7881 Bacteremia: Secondary | ICD-10-CM | POA: Diagnosis not present

## 2017-05-16 DIAGNOSIS — G4733 Obstructive sleep apnea (adult) (pediatric): Secondary | ICD-10-CM | POA: Diagnosis not present

## 2017-05-16 DIAGNOSIS — M869 Osteomyelitis, unspecified: Secondary | ICD-10-CM | POA: Diagnosis not present

## 2017-05-16 DIAGNOSIS — E1142 Type 2 diabetes mellitus with diabetic polyneuropathy: Secondary | ICD-10-CM | POA: Diagnosis not present

## 2017-05-16 DIAGNOSIS — E1169 Type 2 diabetes mellitus with other specified complication: Secondary | ICD-10-CM | POA: Diagnosis not present

## 2017-05-16 DIAGNOSIS — Z7982 Long term (current) use of aspirin: Secondary | ICD-10-CM | POA: Diagnosis not present

## 2017-05-17 DIAGNOSIS — T8189XA Other complications of procedures, not elsewhere classified, initial encounter: Secondary | ICD-10-CM | POA: Diagnosis not present

## 2017-05-18 DIAGNOSIS — T8189XA Other complications of procedures, not elsewhere classified, initial encounter: Secondary | ICD-10-CM | POA: Diagnosis not present

## 2017-05-18 DIAGNOSIS — L03115 Cellulitis of right lower limb: Secondary | ICD-10-CM | POA: Diagnosis not present

## 2017-05-18 DIAGNOSIS — B0052 Herpesviral keratitis: Secondary | ICD-10-CM | POA: Diagnosis not present

## 2017-05-18 DIAGNOSIS — G4733 Obstructive sleep apnea (adult) (pediatric): Secondary | ICD-10-CM | POA: Diagnosis not present

## 2017-05-18 DIAGNOSIS — T8743 Infection of amputation stump, right lower extremity: Secondary | ICD-10-CM | POA: Diagnosis not present

## 2017-05-18 DIAGNOSIS — B9561 Methicillin susceptible Staphylococcus aureus infection as the cause of diseases classified elsewhere: Secondary | ICD-10-CM | POA: Diagnosis not present

## 2017-05-18 DIAGNOSIS — Z452 Encounter for adjustment and management of vascular access device: Secondary | ICD-10-CM | POA: Diagnosis not present

## 2017-05-18 DIAGNOSIS — Z792 Long term (current) use of antibiotics: Secondary | ICD-10-CM | POA: Diagnosis not present

## 2017-05-18 DIAGNOSIS — M86171 Other acute osteomyelitis, right ankle and foot: Secondary | ICD-10-CM | POA: Diagnosis not present

## 2017-05-18 DIAGNOSIS — R7881 Bacteremia: Secondary | ICD-10-CM | POA: Diagnosis not present

## 2017-05-18 DIAGNOSIS — E1142 Type 2 diabetes mellitus with diabetic polyneuropathy: Secondary | ICD-10-CM | POA: Diagnosis not present

## 2017-05-18 DIAGNOSIS — E1169 Type 2 diabetes mellitus with other specified complication: Secondary | ICD-10-CM | POA: Diagnosis not present

## 2017-05-18 DIAGNOSIS — Z7982 Long term (current) use of aspirin: Secondary | ICD-10-CM | POA: Diagnosis not present

## 2017-05-19 DIAGNOSIS — T8740 Infection of amputation stump, unspecified extremity: Secondary | ICD-10-CM | POA: Diagnosis not present

## 2017-05-19 DIAGNOSIS — T8743 Infection of amputation stump, right lower extremity: Secondary | ICD-10-CM | POA: Diagnosis not present

## 2017-05-19 DIAGNOSIS — M869 Osteomyelitis, unspecified: Secondary | ICD-10-CM | POA: Diagnosis not present

## 2017-05-19 DIAGNOSIS — L97412 Non-pressure chronic ulcer of right heel and midfoot with fat layer exposed: Secondary | ICD-10-CM | POA: Diagnosis not present

## 2017-05-19 DIAGNOSIS — Z89411 Acquired absence of right great toe: Secondary | ICD-10-CM | POA: Diagnosis not present

## 2017-05-19 DIAGNOSIS — T8189XA Other complications of procedures, not elsewhere classified, initial encounter: Secondary | ICD-10-CM | POA: Diagnosis not present

## 2017-05-19 DIAGNOSIS — M7989 Other specified soft tissue disorders: Secondary | ICD-10-CM | POA: Diagnosis not present

## 2017-05-19 DIAGNOSIS — M86171 Other acute osteomyelitis, right ankle and foot: Secondary | ICD-10-CM | POA: Diagnosis not present

## 2017-05-20 DIAGNOSIS — T8189XA Other complications of procedures, not elsewhere classified, initial encounter: Secondary | ICD-10-CM | POA: Diagnosis not present

## 2017-05-21 DIAGNOSIS — T8189XA Other complications of procedures, not elsewhere classified, initial encounter: Secondary | ICD-10-CM | POA: Diagnosis not present

## 2017-05-22 DIAGNOSIS — Z792 Long term (current) use of antibiotics: Secondary | ICD-10-CM | POA: Diagnosis not present

## 2017-05-22 DIAGNOSIS — E1169 Type 2 diabetes mellitus with other specified complication: Secondary | ICD-10-CM | POA: Diagnosis not present

## 2017-05-22 DIAGNOSIS — L03115 Cellulitis of right lower limb: Secondary | ICD-10-CM | POA: Diagnosis not present

## 2017-05-22 DIAGNOSIS — E1142 Type 2 diabetes mellitus with diabetic polyneuropathy: Secondary | ICD-10-CM | POA: Diagnosis not present

## 2017-05-22 DIAGNOSIS — T8743 Infection of amputation stump, right lower extremity: Secondary | ICD-10-CM | POA: Diagnosis not present

## 2017-05-22 DIAGNOSIS — G4733 Obstructive sleep apnea (adult) (pediatric): Secondary | ICD-10-CM | POA: Diagnosis not present

## 2017-05-22 DIAGNOSIS — M86171 Other acute osteomyelitis, right ankle and foot: Secondary | ICD-10-CM | POA: Diagnosis not present

## 2017-05-22 DIAGNOSIS — Z452 Encounter for adjustment and management of vascular access device: Secondary | ICD-10-CM | POA: Diagnosis not present

## 2017-05-22 DIAGNOSIS — T8189XA Other complications of procedures, not elsewhere classified, initial encounter: Secondary | ICD-10-CM | POA: Diagnosis not present

## 2017-05-22 DIAGNOSIS — R7881 Bacteremia: Secondary | ICD-10-CM | POA: Diagnosis not present

## 2017-05-22 DIAGNOSIS — Z7982 Long term (current) use of aspirin: Secondary | ICD-10-CM | POA: Diagnosis not present

## 2017-05-22 DIAGNOSIS — B0052 Herpesviral keratitis: Secondary | ICD-10-CM | POA: Diagnosis not present

## 2017-05-22 DIAGNOSIS — B9561 Methicillin susceptible Staphylococcus aureus infection as the cause of diseases classified elsewhere: Secondary | ICD-10-CM | POA: Diagnosis not present

## 2017-05-23 DIAGNOSIS — T8189XA Other complications of procedures, not elsewhere classified, initial encounter: Secondary | ICD-10-CM | POA: Diagnosis not present

## 2017-05-24 DIAGNOSIS — Z792 Long term (current) use of antibiotics: Secondary | ICD-10-CM | POA: Diagnosis not present

## 2017-05-24 DIAGNOSIS — Z7982 Long term (current) use of aspirin: Secondary | ICD-10-CM | POA: Diagnosis not present

## 2017-05-24 DIAGNOSIS — Z452 Encounter for adjustment and management of vascular access device: Secondary | ICD-10-CM | POA: Diagnosis not present

## 2017-05-24 DIAGNOSIS — L03115 Cellulitis of right lower limb: Secondary | ICD-10-CM | POA: Diagnosis not present

## 2017-05-24 DIAGNOSIS — E1142 Type 2 diabetes mellitus with diabetic polyneuropathy: Secondary | ICD-10-CM | POA: Diagnosis not present

## 2017-05-24 DIAGNOSIS — T8189XA Other complications of procedures, not elsewhere classified, initial encounter: Secondary | ICD-10-CM | POA: Diagnosis not present

## 2017-05-24 DIAGNOSIS — B0052 Herpesviral keratitis: Secondary | ICD-10-CM | POA: Diagnosis not present

## 2017-05-24 DIAGNOSIS — R7881 Bacteremia: Secondary | ICD-10-CM | POA: Diagnosis not present

## 2017-05-24 DIAGNOSIS — B9561 Methicillin susceptible Staphylococcus aureus infection as the cause of diseases classified elsewhere: Secondary | ICD-10-CM | POA: Diagnosis not present

## 2017-05-24 DIAGNOSIS — G4733 Obstructive sleep apnea (adult) (pediatric): Secondary | ICD-10-CM | POA: Diagnosis not present

## 2017-05-24 DIAGNOSIS — T8743 Infection of amputation stump, right lower extremity: Secondary | ICD-10-CM | POA: Diagnosis not present

## 2017-05-24 DIAGNOSIS — E1169 Type 2 diabetes mellitus with other specified complication: Secondary | ICD-10-CM | POA: Diagnosis not present

## 2017-05-24 DIAGNOSIS — M86171 Other acute osteomyelitis, right ankle and foot: Secondary | ICD-10-CM | POA: Diagnosis not present

## 2017-05-24 NOTE — Telephone Encounter (Signed)
Orders

## 2017-05-25 DIAGNOSIS — T8189XA Other complications of procedures, not elsewhere classified, initial encounter: Secondary | ICD-10-CM | POA: Diagnosis not present

## 2017-05-26 DIAGNOSIS — T8189XA Other complications of procedures, not elsewhere classified, initial encounter: Secondary | ICD-10-CM | POA: Diagnosis not present

## 2017-05-26 DIAGNOSIS — G4733 Obstructive sleep apnea (adult) (pediatric): Secondary | ICD-10-CM | POA: Diagnosis not present

## 2017-05-26 DIAGNOSIS — B9561 Methicillin susceptible Staphylococcus aureus infection as the cause of diseases classified elsewhere: Secondary | ICD-10-CM | POA: Diagnosis not present

## 2017-05-26 DIAGNOSIS — M86171 Other acute osteomyelitis, right ankle and foot: Secondary | ICD-10-CM | POA: Diagnosis not present

## 2017-05-26 DIAGNOSIS — T8743 Infection of amputation stump, right lower extremity: Secondary | ICD-10-CM | POA: Diagnosis not present

## 2017-05-26 DIAGNOSIS — L03115 Cellulitis of right lower limb: Secondary | ICD-10-CM | POA: Diagnosis not present

## 2017-05-26 DIAGNOSIS — E1169 Type 2 diabetes mellitus with other specified complication: Secondary | ICD-10-CM | POA: Diagnosis not present

## 2017-05-26 DIAGNOSIS — Z792 Long term (current) use of antibiotics: Secondary | ICD-10-CM | POA: Diagnosis not present

## 2017-05-26 DIAGNOSIS — Z7982 Long term (current) use of aspirin: Secondary | ICD-10-CM | POA: Diagnosis not present

## 2017-05-26 DIAGNOSIS — Z452 Encounter for adjustment and management of vascular access device: Secondary | ICD-10-CM | POA: Diagnosis not present

## 2017-05-26 DIAGNOSIS — E1142 Type 2 diabetes mellitus with diabetic polyneuropathy: Secondary | ICD-10-CM | POA: Diagnosis not present

## 2017-05-26 DIAGNOSIS — R7881 Bacteremia: Secondary | ICD-10-CM | POA: Diagnosis not present

## 2017-05-26 DIAGNOSIS — B0052 Herpesviral keratitis: Secondary | ICD-10-CM | POA: Diagnosis not present

## 2017-05-27 DIAGNOSIS — T8189XA Other complications of procedures, not elsewhere classified, initial encounter: Secondary | ICD-10-CM | POA: Diagnosis not present

## 2017-05-28 DIAGNOSIS — T8189XA Other complications of procedures, not elsewhere classified, initial encounter: Secondary | ICD-10-CM | POA: Diagnosis not present

## 2017-05-29 DIAGNOSIS — L03115 Cellulitis of right lower limb: Secondary | ICD-10-CM | POA: Diagnosis not present

## 2017-05-29 DIAGNOSIS — T8189XA Other complications of procedures, not elsewhere classified, initial encounter: Secondary | ICD-10-CM | POA: Diagnosis not present

## 2017-05-29 DIAGNOSIS — R7881 Bacteremia: Secondary | ICD-10-CM | POA: Diagnosis not present

## 2017-05-29 DIAGNOSIS — M86171 Other acute osteomyelitis, right ankle and foot: Secondary | ICD-10-CM | POA: Diagnosis not present

## 2017-05-29 DIAGNOSIS — G4733 Obstructive sleep apnea (adult) (pediatric): Secondary | ICD-10-CM | POA: Diagnosis not present

## 2017-05-29 DIAGNOSIS — B0052 Herpesviral keratitis: Secondary | ICD-10-CM | POA: Diagnosis not present

## 2017-05-29 DIAGNOSIS — Z792 Long term (current) use of antibiotics: Secondary | ICD-10-CM | POA: Diagnosis not present

## 2017-05-29 DIAGNOSIS — E1169 Type 2 diabetes mellitus with other specified complication: Secondary | ICD-10-CM | POA: Diagnosis not present

## 2017-05-29 DIAGNOSIS — B9561 Methicillin susceptible Staphylococcus aureus infection as the cause of diseases classified elsewhere: Secondary | ICD-10-CM | POA: Diagnosis not present

## 2017-05-29 DIAGNOSIS — E1142 Type 2 diabetes mellitus with diabetic polyneuropathy: Secondary | ICD-10-CM | POA: Diagnosis not present

## 2017-05-29 DIAGNOSIS — Z452 Encounter for adjustment and management of vascular access device: Secondary | ICD-10-CM | POA: Diagnosis not present

## 2017-05-29 DIAGNOSIS — Z7982 Long term (current) use of aspirin: Secondary | ICD-10-CM | POA: Diagnosis not present

## 2017-05-29 DIAGNOSIS — T8743 Infection of amputation stump, right lower extremity: Secondary | ICD-10-CM | POA: Diagnosis not present

## 2017-05-30 DIAGNOSIS — T8189XA Other complications of procedures, not elsewhere classified, initial encounter: Secondary | ICD-10-CM | POA: Diagnosis not present

## 2017-05-31 DIAGNOSIS — Z792 Long term (current) use of antibiotics: Secondary | ICD-10-CM | POA: Diagnosis not present

## 2017-05-31 DIAGNOSIS — M86171 Other acute osteomyelitis, right ankle and foot: Secondary | ICD-10-CM | POA: Diagnosis not present

## 2017-05-31 DIAGNOSIS — B0052 Herpesviral keratitis: Secondary | ICD-10-CM | POA: Diagnosis not present

## 2017-05-31 DIAGNOSIS — G4733 Obstructive sleep apnea (adult) (pediatric): Secondary | ICD-10-CM | POA: Diagnosis not present

## 2017-05-31 DIAGNOSIS — R7881 Bacteremia: Secondary | ICD-10-CM | POA: Diagnosis not present

## 2017-05-31 DIAGNOSIS — Z452 Encounter for adjustment and management of vascular access device: Secondary | ICD-10-CM | POA: Diagnosis not present

## 2017-05-31 DIAGNOSIS — L03115 Cellulitis of right lower limb: Secondary | ICD-10-CM | POA: Diagnosis not present

## 2017-05-31 DIAGNOSIS — B9561 Methicillin susceptible Staphylococcus aureus infection as the cause of diseases classified elsewhere: Secondary | ICD-10-CM | POA: Diagnosis not present

## 2017-05-31 DIAGNOSIS — Z7982 Long term (current) use of aspirin: Secondary | ICD-10-CM | POA: Diagnosis not present

## 2017-05-31 DIAGNOSIS — T8743 Infection of amputation stump, right lower extremity: Secondary | ICD-10-CM | POA: Diagnosis not present

## 2017-05-31 DIAGNOSIS — T8189XA Other complications of procedures, not elsewhere classified, initial encounter: Secondary | ICD-10-CM | POA: Diagnosis not present

## 2017-05-31 DIAGNOSIS — E1169 Type 2 diabetes mellitus with other specified complication: Secondary | ICD-10-CM | POA: Diagnosis not present

## 2017-05-31 DIAGNOSIS — E1142 Type 2 diabetes mellitus with diabetic polyneuropathy: Secondary | ICD-10-CM | POA: Diagnosis not present

## 2017-06-01 DIAGNOSIS — T8189XA Other complications of procedures, not elsewhere classified, initial encounter: Secondary | ICD-10-CM | POA: Diagnosis not present

## 2017-06-02 DIAGNOSIS — M86171 Other acute osteomyelitis, right ankle and foot: Secondary | ICD-10-CM | POA: Diagnosis not present

## 2017-06-02 DIAGNOSIS — T8189XA Other complications of procedures, not elsewhere classified, initial encounter: Secondary | ICD-10-CM | POA: Diagnosis not present

## 2017-06-02 DIAGNOSIS — Z7982 Long term (current) use of aspirin: Secondary | ICD-10-CM | POA: Diagnosis not present

## 2017-06-02 DIAGNOSIS — E1169 Type 2 diabetes mellitus with other specified complication: Secondary | ICD-10-CM | POA: Diagnosis not present

## 2017-06-02 DIAGNOSIS — Z452 Encounter for adjustment and management of vascular access device: Secondary | ICD-10-CM | POA: Diagnosis not present

## 2017-06-02 DIAGNOSIS — R7881 Bacteremia: Secondary | ICD-10-CM | POA: Diagnosis not present

## 2017-06-02 DIAGNOSIS — L03115 Cellulitis of right lower limb: Secondary | ICD-10-CM | POA: Diagnosis not present

## 2017-06-02 DIAGNOSIS — E1142 Type 2 diabetes mellitus with diabetic polyneuropathy: Secondary | ICD-10-CM | POA: Diagnosis not present

## 2017-06-02 DIAGNOSIS — T8743 Infection of amputation stump, right lower extremity: Secondary | ICD-10-CM | POA: Diagnosis not present

## 2017-06-02 DIAGNOSIS — B0052 Herpesviral keratitis: Secondary | ICD-10-CM | POA: Diagnosis not present

## 2017-06-02 DIAGNOSIS — Z792 Long term (current) use of antibiotics: Secondary | ICD-10-CM | POA: Diagnosis not present

## 2017-06-02 DIAGNOSIS — B9561 Methicillin susceptible Staphylococcus aureus infection as the cause of diseases classified elsewhere: Secondary | ICD-10-CM | POA: Diagnosis not present

## 2017-06-02 DIAGNOSIS — G4733 Obstructive sleep apnea (adult) (pediatric): Secondary | ICD-10-CM | POA: Diagnosis not present

## 2017-06-03 DIAGNOSIS — T8189XA Other complications of procedures, not elsewhere classified, initial encounter: Secondary | ICD-10-CM | POA: Diagnosis not present

## 2017-06-04 DIAGNOSIS — T8189XA Other complications of procedures, not elsewhere classified, initial encounter: Secondary | ICD-10-CM | POA: Diagnosis not present

## 2017-06-05 DIAGNOSIS — L97412 Non-pressure chronic ulcer of right heel and midfoot with fat layer exposed: Secondary | ICD-10-CM | POA: Diagnosis not present

## 2017-06-05 DIAGNOSIS — T8740 Infection of amputation stump, unspecified extremity: Secondary | ICD-10-CM | POA: Diagnosis not present

## 2017-06-05 DIAGNOSIS — T8189XA Other complications of procedures, not elsewhere classified, initial encounter: Secondary | ICD-10-CM | POA: Diagnosis not present

## 2017-06-05 DIAGNOSIS — M86171 Other acute osteomyelitis, right ankle and foot: Secondary | ICD-10-CM | POA: Diagnosis not present

## 2017-06-06 DIAGNOSIS — T8189XA Other complications of procedures, not elsewhere classified, initial encounter: Secondary | ICD-10-CM | POA: Diagnosis not present

## 2017-06-07 DIAGNOSIS — Z792 Long term (current) use of antibiotics: Secondary | ICD-10-CM | POA: Diagnosis not present

## 2017-06-07 DIAGNOSIS — T8743 Infection of amputation stump, right lower extremity: Secondary | ICD-10-CM | POA: Diagnosis not present

## 2017-06-07 DIAGNOSIS — E1169 Type 2 diabetes mellitus with other specified complication: Secondary | ICD-10-CM | POA: Diagnosis not present

## 2017-06-07 DIAGNOSIS — M86171 Other acute osteomyelitis, right ankle and foot: Secondary | ICD-10-CM | POA: Diagnosis not present

## 2017-06-07 DIAGNOSIS — E1142 Type 2 diabetes mellitus with diabetic polyneuropathy: Secondary | ICD-10-CM | POA: Diagnosis not present

## 2017-06-07 DIAGNOSIS — B0052 Herpesviral keratitis: Secondary | ICD-10-CM | POA: Diagnosis not present

## 2017-06-07 DIAGNOSIS — Z452 Encounter for adjustment and management of vascular access device: Secondary | ICD-10-CM | POA: Diagnosis not present

## 2017-06-07 DIAGNOSIS — L03115 Cellulitis of right lower limb: Secondary | ICD-10-CM | POA: Diagnosis not present

## 2017-06-07 DIAGNOSIS — R7881 Bacteremia: Secondary | ICD-10-CM | POA: Diagnosis not present

## 2017-06-07 DIAGNOSIS — G4733 Obstructive sleep apnea (adult) (pediatric): Secondary | ICD-10-CM | POA: Diagnosis not present

## 2017-06-07 DIAGNOSIS — B9561 Methicillin susceptible Staphylococcus aureus infection as the cause of diseases classified elsewhere: Secondary | ICD-10-CM | POA: Diagnosis not present

## 2017-06-07 DIAGNOSIS — Z7982 Long term (current) use of aspirin: Secondary | ICD-10-CM | POA: Diagnosis not present

## 2017-06-07 DIAGNOSIS — T8189XA Other complications of procedures, not elsewhere classified, initial encounter: Secondary | ICD-10-CM | POA: Diagnosis not present

## 2017-06-08 DIAGNOSIS — T8189XA Other complications of procedures, not elsewhere classified, initial encounter: Secondary | ICD-10-CM | POA: Diagnosis not present

## 2017-06-09 DIAGNOSIS — T8743 Infection of amputation stump, right lower extremity: Secondary | ICD-10-CM | POA: Diagnosis not present

## 2017-06-09 DIAGNOSIS — R7881 Bacteremia: Secondary | ICD-10-CM | POA: Diagnosis not present

## 2017-06-09 DIAGNOSIS — Z792 Long term (current) use of antibiotics: Secondary | ICD-10-CM | POA: Diagnosis not present

## 2017-06-09 DIAGNOSIS — G4733 Obstructive sleep apnea (adult) (pediatric): Secondary | ICD-10-CM | POA: Diagnosis not present

## 2017-06-09 DIAGNOSIS — M86171 Other acute osteomyelitis, right ankle and foot: Secondary | ICD-10-CM | POA: Diagnosis not present

## 2017-06-09 DIAGNOSIS — B9561 Methicillin susceptible Staphylococcus aureus infection as the cause of diseases classified elsewhere: Secondary | ICD-10-CM | POA: Diagnosis not present

## 2017-06-09 DIAGNOSIS — Z452 Encounter for adjustment and management of vascular access device: Secondary | ICD-10-CM | POA: Diagnosis not present

## 2017-06-09 DIAGNOSIS — B0052 Herpesviral keratitis: Secondary | ICD-10-CM | POA: Diagnosis not present

## 2017-06-09 DIAGNOSIS — L03115 Cellulitis of right lower limb: Secondary | ICD-10-CM | POA: Diagnosis not present

## 2017-06-09 DIAGNOSIS — E1142 Type 2 diabetes mellitus with diabetic polyneuropathy: Secondary | ICD-10-CM | POA: Diagnosis not present

## 2017-06-09 DIAGNOSIS — E1169 Type 2 diabetes mellitus with other specified complication: Secondary | ICD-10-CM | POA: Diagnosis not present

## 2017-06-09 DIAGNOSIS — T8189XA Other complications of procedures, not elsewhere classified, initial encounter: Secondary | ICD-10-CM | POA: Diagnosis not present

## 2017-06-09 DIAGNOSIS — Z7982 Long term (current) use of aspirin: Secondary | ICD-10-CM | POA: Diagnosis not present

## 2017-06-10 DIAGNOSIS — T8189XA Other complications of procedures, not elsewhere classified, initial encounter: Secondary | ICD-10-CM | POA: Diagnosis not present

## 2017-06-11 DIAGNOSIS — T8189XA Other complications of procedures, not elsewhere classified, initial encounter: Secondary | ICD-10-CM | POA: Diagnosis not present

## 2017-06-12 DIAGNOSIS — G4733 Obstructive sleep apnea (adult) (pediatric): Secondary | ICD-10-CM | POA: Diagnosis not present

## 2017-06-12 DIAGNOSIS — E1142 Type 2 diabetes mellitus with diabetic polyneuropathy: Secondary | ICD-10-CM | POA: Diagnosis not present

## 2017-06-12 DIAGNOSIS — Z7982 Long term (current) use of aspirin: Secondary | ICD-10-CM | POA: Diagnosis not present

## 2017-06-12 DIAGNOSIS — E1169 Type 2 diabetes mellitus with other specified complication: Secondary | ICD-10-CM | POA: Diagnosis not present

## 2017-06-12 DIAGNOSIS — B9561 Methicillin susceptible Staphylococcus aureus infection as the cause of diseases classified elsewhere: Secondary | ICD-10-CM | POA: Diagnosis not present

## 2017-06-12 DIAGNOSIS — Z792 Long term (current) use of antibiotics: Secondary | ICD-10-CM | POA: Diagnosis not present

## 2017-06-12 DIAGNOSIS — T8189XA Other complications of procedures, not elsewhere classified, initial encounter: Secondary | ICD-10-CM | POA: Diagnosis not present

## 2017-06-12 DIAGNOSIS — Z452 Encounter for adjustment and management of vascular access device: Secondary | ICD-10-CM | POA: Diagnosis not present

## 2017-06-12 DIAGNOSIS — L03115 Cellulitis of right lower limb: Secondary | ICD-10-CM | POA: Diagnosis not present

## 2017-06-12 DIAGNOSIS — M86171 Other acute osteomyelitis, right ankle and foot: Secondary | ICD-10-CM | POA: Diagnosis not present

## 2017-06-12 DIAGNOSIS — T8743 Infection of amputation stump, right lower extremity: Secondary | ICD-10-CM | POA: Diagnosis not present

## 2017-06-12 DIAGNOSIS — B0052 Herpesviral keratitis: Secondary | ICD-10-CM | POA: Diagnosis not present

## 2017-06-12 DIAGNOSIS — R7881 Bacteremia: Secondary | ICD-10-CM | POA: Diagnosis not present

## 2017-06-13 DIAGNOSIS — T8189XA Other complications of procedures, not elsewhere classified, initial encounter: Secondary | ICD-10-CM | POA: Diagnosis not present

## 2017-06-14 DIAGNOSIS — B0052 Herpesviral keratitis: Secondary | ICD-10-CM | POA: Diagnosis not present

## 2017-06-14 DIAGNOSIS — T8743 Infection of amputation stump, right lower extremity: Secondary | ICD-10-CM | POA: Diagnosis not present

## 2017-06-14 DIAGNOSIS — G4733 Obstructive sleep apnea (adult) (pediatric): Secondary | ICD-10-CM | POA: Diagnosis not present

## 2017-06-14 DIAGNOSIS — Z7982 Long term (current) use of aspirin: Secondary | ICD-10-CM | POA: Diagnosis not present

## 2017-06-14 DIAGNOSIS — L03115 Cellulitis of right lower limb: Secondary | ICD-10-CM | POA: Diagnosis not present

## 2017-06-14 DIAGNOSIS — R7881 Bacteremia: Secondary | ICD-10-CM | POA: Diagnosis not present

## 2017-06-14 DIAGNOSIS — Z792 Long term (current) use of antibiotics: Secondary | ICD-10-CM | POA: Diagnosis not present

## 2017-06-14 DIAGNOSIS — Z452 Encounter for adjustment and management of vascular access device: Secondary | ICD-10-CM | POA: Diagnosis not present

## 2017-06-14 DIAGNOSIS — E1169 Type 2 diabetes mellitus with other specified complication: Secondary | ICD-10-CM | POA: Diagnosis not present

## 2017-06-14 DIAGNOSIS — T8189XA Other complications of procedures, not elsewhere classified, initial encounter: Secondary | ICD-10-CM | POA: Diagnosis not present

## 2017-06-14 DIAGNOSIS — M86171 Other acute osteomyelitis, right ankle and foot: Secondary | ICD-10-CM | POA: Diagnosis not present

## 2017-06-14 DIAGNOSIS — B9561 Methicillin susceptible Staphylococcus aureus infection as the cause of diseases classified elsewhere: Secondary | ICD-10-CM | POA: Diagnosis not present

## 2017-06-14 DIAGNOSIS — E1142 Type 2 diabetes mellitus with diabetic polyneuropathy: Secondary | ICD-10-CM | POA: Diagnosis not present

## 2017-06-15 DIAGNOSIS — T8189XA Other complications of procedures, not elsewhere classified, initial encounter: Secondary | ICD-10-CM | POA: Diagnosis not present

## 2017-06-16 DIAGNOSIS — T8189XA Other complications of procedures, not elsewhere classified, initial encounter: Secondary | ICD-10-CM | POA: Diagnosis not present

## 2017-06-17 DIAGNOSIS — B0052 Herpesviral keratitis: Secondary | ICD-10-CM | POA: Diagnosis not present

## 2017-06-17 DIAGNOSIS — Z792 Long term (current) use of antibiotics: Secondary | ICD-10-CM | POA: Diagnosis not present

## 2017-06-17 DIAGNOSIS — E1142 Type 2 diabetes mellitus with diabetic polyneuropathy: Secondary | ICD-10-CM | POA: Diagnosis not present

## 2017-06-17 DIAGNOSIS — T8189XA Other complications of procedures, not elsewhere classified, initial encounter: Secondary | ICD-10-CM | POA: Diagnosis not present

## 2017-06-17 DIAGNOSIS — T8743 Infection of amputation stump, right lower extremity: Secondary | ICD-10-CM | POA: Diagnosis not present

## 2017-06-17 DIAGNOSIS — G4733 Obstructive sleep apnea (adult) (pediatric): Secondary | ICD-10-CM | POA: Diagnosis not present

## 2017-06-17 DIAGNOSIS — L03115 Cellulitis of right lower limb: Secondary | ICD-10-CM | POA: Diagnosis not present

## 2017-06-17 DIAGNOSIS — R7881 Bacteremia: Secondary | ICD-10-CM | POA: Diagnosis not present

## 2017-06-17 DIAGNOSIS — B9561 Methicillin susceptible Staphylococcus aureus infection as the cause of diseases classified elsewhere: Secondary | ICD-10-CM | POA: Diagnosis not present

## 2017-06-17 DIAGNOSIS — Z7982 Long term (current) use of aspirin: Secondary | ICD-10-CM | POA: Diagnosis not present

## 2017-06-17 DIAGNOSIS — E1169 Type 2 diabetes mellitus with other specified complication: Secondary | ICD-10-CM | POA: Diagnosis not present

## 2017-06-17 DIAGNOSIS — Z452 Encounter for adjustment and management of vascular access device: Secondary | ICD-10-CM | POA: Diagnosis not present

## 2017-06-17 DIAGNOSIS — M86171 Other acute osteomyelitis, right ankle and foot: Secondary | ICD-10-CM | POA: Diagnosis not present

## 2017-06-18 DIAGNOSIS — T8189XA Other complications of procedures, not elsewhere classified, initial encounter: Secondary | ICD-10-CM | POA: Diagnosis not present

## 2017-06-19 DIAGNOSIS — L97512 Non-pressure chronic ulcer of other part of right foot with fat layer exposed: Secondary | ICD-10-CM | POA: Diagnosis not present

## 2017-06-19 DIAGNOSIS — M86171 Other acute osteomyelitis, right ankle and foot: Secondary | ICD-10-CM | POA: Diagnosis not present

## 2017-06-19 DIAGNOSIS — T8189XA Other complications of procedures, not elsewhere classified, initial encounter: Secondary | ICD-10-CM | POA: Diagnosis not present

## 2017-06-19 DIAGNOSIS — L97412 Non-pressure chronic ulcer of right heel and midfoot with fat layer exposed: Secondary | ICD-10-CM | POA: Diagnosis not present

## 2017-06-20 DIAGNOSIS — T8189XA Other complications of procedures, not elsewhere classified, initial encounter: Secondary | ICD-10-CM | POA: Diagnosis not present

## 2017-06-21 DIAGNOSIS — Z452 Encounter for adjustment and management of vascular access device: Secondary | ICD-10-CM | POA: Diagnosis not present

## 2017-06-21 DIAGNOSIS — L03115 Cellulitis of right lower limb: Secondary | ICD-10-CM | POA: Diagnosis not present

## 2017-06-21 DIAGNOSIS — E1169 Type 2 diabetes mellitus with other specified complication: Secondary | ICD-10-CM | POA: Diagnosis not present

## 2017-06-21 DIAGNOSIS — E1142 Type 2 diabetes mellitus with diabetic polyneuropathy: Secondary | ICD-10-CM | POA: Diagnosis not present

## 2017-06-21 DIAGNOSIS — T8743 Infection of amputation stump, right lower extremity: Secondary | ICD-10-CM | POA: Diagnosis not present

## 2017-06-21 DIAGNOSIS — B0052 Herpesviral keratitis: Secondary | ICD-10-CM | POA: Diagnosis not present

## 2017-06-21 DIAGNOSIS — Z792 Long term (current) use of antibiotics: Secondary | ICD-10-CM | POA: Diagnosis not present

## 2017-06-21 DIAGNOSIS — T8189XA Other complications of procedures, not elsewhere classified, initial encounter: Secondary | ICD-10-CM | POA: Diagnosis not present

## 2017-06-21 DIAGNOSIS — M86171 Other acute osteomyelitis, right ankle and foot: Secondary | ICD-10-CM | POA: Diagnosis not present

## 2017-06-21 DIAGNOSIS — R7881 Bacteremia: Secondary | ICD-10-CM | POA: Diagnosis not present

## 2017-06-21 DIAGNOSIS — B9561 Methicillin susceptible Staphylococcus aureus infection as the cause of diseases classified elsewhere: Secondary | ICD-10-CM | POA: Diagnosis not present

## 2017-06-21 DIAGNOSIS — G4733 Obstructive sleep apnea (adult) (pediatric): Secondary | ICD-10-CM | POA: Diagnosis not present

## 2017-06-21 DIAGNOSIS — Z7982 Long term (current) use of aspirin: Secondary | ICD-10-CM | POA: Diagnosis not present

## 2017-06-22 DIAGNOSIS — T8189XA Other complications of procedures, not elsewhere classified, initial encounter: Secondary | ICD-10-CM | POA: Diagnosis not present

## 2017-06-23 DIAGNOSIS — T8189XA Other complications of procedures, not elsewhere classified, initial encounter: Secondary | ICD-10-CM | POA: Diagnosis not present

## 2017-06-24 DIAGNOSIS — L03115 Cellulitis of right lower limb: Secondary | ICD-10-CM | POA: Diagnosis not present

## 2017-06-24 DIAGNOSIS — E1142 Type 2 diabetes mellitus with diabetic polyneuropathy: Secondary | ICD-10-CM | POA: Diagnosis not present

## 2017-06-24 DIAGNOSIS — B9561 Methicillin susceptible Staphylococcus aureus infection as the cause of diseases classified elsewhere: Secondary | ICD-10-CM | POA: Diagnosis not present

## 2017-06-24 DIAGNOSIS — Z7982 Long term (current) use of aspirin: Secondary | ICD-10-CM | POA: Diagnosis not present

## 2017-06-24 DIAGNOSIS — T8743 Infection of amputation stump, right lower extremity: Secondary | ICD-10-CM | POA: Diagnosis not present

## 2017-06-24 DIAGNOSIS — M86171 Other acute osteomyelitis, right ankle and foot: Secondary | ICD-10-CM | POA: Diagnosis not present

## 2017-06-24 DIAGNOSIS — E1169 Type 2 diabetes mellitus with other specified complication: Secondary | ICD-10-CM | POA: Diagnosis not present

## 2017-06-24 DIAGNOSIS — Z792 Long term (current) use of antibiotics: Secondary | ICD-10-CM | POA: Diagnosis not present

## 2017-06-24 DIAGNOSIS — R7881 Bacteremia: Secondary | ICD-10-CM | POA: Diagnosis not present

## 2017-06-24 DIAGNOSIS — Z452 Encounter for adjustment and management of vascular access device: Secondary | ICD-10-CM | POA: Diagnosis not present

## 2017-06-24 DIAGNOSIS — T8189XA Other complications of procedures, not elsewhere classified, initial encounter: Secondary | ICD-10-CM | POA: Diagnosis not present

## 2017-06-24 DIAGNOSIS — G4733 Obstructive sleep apnea (adult) (pediatric): Secondary | ICD-10-CM | POA: Diagnosis not present

## 2017-06-24 DIAGNOSIS — B0052 Herpesviral keratitis: Secondary | ICD-10-CM | POA: Diagnosis not present

## 2017-06-25 DIAGNOSIS — T8189XA Other complications of procedures, not elsewhere classified, initial encounter: Secondary | ICD-10-CM | POA: Diagnosis not present

## 2017-06-26 DIAGNOSIS — Z452 Encounter for adjustment and management of vascular access device: Secondary | ICD-10-CM | POA: Diagnosis not present

## 2017-06-26 DIAGNOSIS — R7881 Bacteremia: Secondary | ICD-10-CM | POA: Diagnosis not present

## 2017-06-26 DIAGNOSIS — L03115 Cellulitis of right lower limb: Secondary | ICD-10-CM | POA: Diagnosis not present

## 2017-06-26 DIAGNOSIS — Z7982 Long term (current) use of aspirin: Secondary | ICD-10-CM | POA: Diagnosis not present

## 2017-06-26 DIAGNOSIS — G4733 Obstructive sleep apnea (adult) (pediatric): Secondary | ICD-10-CM | POA: Diagnosis not present

## 2017-06-26 DIAGNOSIS — M86171 Other acute osteomyelitis, right ankle and foot: Secondary | ICD-10-CM | POA: Diagnosis not present

## 2017-06-26 DIAGNOSIS — B9561 Methicillin susceptible Staphylococcus aureus infection as the cause of diseases classified elsewhere: Secondary | ICD-10-CM | POA: Diagnosis not present

## 2017-06-26 DIAGNOSIS — E1142 Type 2 diabetes mellitus with diabetic polyneuropathy: Secondary | ICD-10-CM | POA: Diagnosis not present

## 2017-06-26 DIAGNOSIS — T8189XA Other complications of procedures, not elsewhere classified, initial encounter: Secondary | ICD-10-CM | POA: Diagnosis not present

## 2017-06-26 DIAGNOSIS — E1169 Type 2 diabetes mellitus with other specified complication: Secondary | ICD-10-CM | POA: Diagnosis not present

## 2017-06-26 DIAGNOSIS — B0052 Herpesviral keratitis: Secondary | ICD-10-CM | POA: Diagnosis not present

## 2017-06-26 DIAGNOSIS — Z792 Long term (current) use of antibiotics: Secondary | ICD-10-CM | POA: Diagnosis not present

## 2017-06-26 DIAGNOSIS — T8743 Infection of amputation stump, right lower extremity: Secondary | ICD-10-CM | POA: Diagnosis not present

## 2017-06-27 DIAGNOSIS — T8189XA Other complications of procedures, not elsewhere classified, initial encounter: Secondary | ICD-10-CM | POA: Diagnosis not present

## 2017-06-28 ENCOUNTER — Telehealth: Payer: Self-pay

## 2017-06-28 DIAGNOSIS — Z792 Long term (current) use of antibiotics: Secondary | ICD-10-CM | POA: Diagnosis not present

## 2017-06-28 DIAGNOSIS — B0052 Herpesviral keratitis: Secondary | ICD-10-CM | POA: Diagnosis not present

## 2017-06-28 DIAGNOSIS — G4733 Obstructive sleep apnea (adult) (pediatric): Secondary | ICD-10-CM | POA: Diagnosis not present

## 2017-06-28 DIAGNOSIS — L03115 Cellulitis of right lower limb: Secondary | ICD-10-CM | POA: Diagnosis not present

## 2017-06-28 DIAGNOSIS — Z7982 Long term (current) use of aspirin: Secondary | ICD-10-CM | POA: Diagnosis not present

## 2017-06-28 DIAGNOSIS — M86171 Other acute osteomyelitis, right ankle and foot: Secondary | ICD-10-CM | POA: Diagnosis not present

## 2017-06-28 DIAGNOSIS — T8743 Infection of amputation stump, right lower extremity: Secondary | ICD-10-CM | POA: Diagnosis not present

## 2017-06-28 DIAGNOSIS — E1142 Type 2 diabetes mellitus with diabetic polyneuropathy: Secondary | ICD-10-CM | POA: Diagnosis not present

## 2017-06-28 DIAGNOSIS — B9561 Methicillin susceptible Staphylococcus aureus infection as the cause of diseases classified elsewhere: Secondary | ICD-10-CM | POA: Diagnosis not present

## 2017-06-28 DIAGNOSIS — E1169 Type 2 diabetes mellitus with other specified complication: Secondary | ICD-10-CM | POA: Diagnosis not present

## 2017-06-28 DIAGNOSIS — Z452 Encounter for adjustment and management of vascular access device: Secondary | ICD-10-CM | POA: Diagnosis not present

## 2017-06-28 DIAGNOSIS — R7881 Bacteremia: Secondary | ICD-10-CM | POA: Diagnosis not present

## 2017-06-28 DIAGNOSIS — T8189XA Other complications of procedures, not elsewhere classified, initial encounter: Secondary | ICD-10-CM | POA: Diagnosis not present

## 2017-06-28 NOTE — Telephone Encounter (Signed)
RN from [facility] called to inform you that the pt has been receiving wound care from them. The RN stated that the pt's insurance would only cover 30 home visits and his last visit will be on 07/12/2017. They mentioned to the pt that he had the options of paying out of pocket to continue home visits or he could start going to the wound care center. The pt stated to the RN that he would speak with his wife about it and then let us know what he wants to do.

## 2017-06-29 DIAGNOSIS — T8189XA Other complications of procedures, not elsewhere classified, initial encounter: Secondary | ICD-10-CM | POA: Diagnosis not present

## 2017-06-30 DIAGNOSIS — T8189XA Other complications of procedures, not elsewhere classified, initial encounter: Secondary | ICD-10-CM | POA: Diagnosis not present

## 2017-06-30 DIAGNOSIS — E1169 Type 2 diabetes mellitus with other specified complication: Secondary | ICD-10-CM | POA: Diagnosis not present

## 2017-06-30 DIAGNOSIS — M86171 Other acute osteomyelitis, right ankle and foot: Secondary | ICD-10-CM | POA: Diagnosis not present

## 2017-06-30 DIAGNOSIS — R7881 Bacteremia: Secondary | ICD-10-CM | POA: Diagnosis not present

## 2017-06-30 DIAGNOSIS — Z452 Encounter for adjustment and management of vascular access device: Secondary | ICD-10-CM | POA: Diagnosis not present

## 2017-06-30 DIAGNOSIS — E1142 Type 2 diabetes mellitus with diabetic polyneuropathy: Secondary | ICD-10-CM | POA: Diagnosis not present

## 2017-06-30 DIAGNOSIS — B0052 Herpesviral keratitis: Secondary | ICD-10-CM | POA: Diagnosis not present

## 2017-06-30 DIAGNOSIS — T8743 Infection of amputation stump, right lower extremity: Secondary | ICD-10-CM | POA: Diagnosis not present

## 2017-06-30 DIAGNOSIS — Z7982 Long term (current) use of aspirin: Secondary | ICD-10-CM | POA: Diagnosis not present

## 2017-06-30 DIAGNOSIS — Z792 Long term (current) use of antibiotics: Secondary | ICD-10-CM | POA: Diagnosis not present

## 2017-06-30 DIAGNOSIS — Z23 Encounter for immunization: Secondary | ICD-10-CM | POA: Diagnosis not present

## 2017-06-30 DIAGNOSIS — B9561 Methicillin susceptible Staphylococcus aureus infection as the cause of diseases classified elsewhere: Secondary | ICD-10-CM | POA: Diagnosis not present

## 2017-06-30 DIAGNOSIS — G4733 Obstructive sleep apnea (adult) (pediatric): Secondary | ICD-10-CM | POA: Diagnosis not present

## 2017-06-30 DIAGNOSIS — L03115 Cellulitis of right lower limb: Secondary | ICD-10-CM | POA: Diagnosis not present

## 2017-07-01 DIAGNOSIS — T8189XA Other complications of procedures, not elsewhere classified, initial encounter: Secondary | ICD-10-CM | POA: Diagnosis not present

## 2017-07-02 DIAGNOSIS — T8189XA Other complications of procedures, not elsewhere classified, initial encounter: Secondary | ICD-10-CM | POA: Diagnosis not present

## 2017-07-03 DIAGNOSIS — Z7982 Long term (current) use of aspirin: Secondary | ICD-10-CM | POA: Diagnosis not present

## 2017-07-03 DIAGNOSIS — T8743 Infection of amputation stump, right lower extremity: Secondary | ICD-10-CM | POA: Diagnosis not present

## 2017-07-03 DIAGNOSIS — B9561 Methicillin susceptible Staphylococcus aureus infection as the cause of diseases classified elsewhere: Secondary | ICD-10-CM | POA: Diagnosis not present

## 2017-07-03 DIAGNOSIS — Z792 Long term (current) use of antibiotics: Secondary | ICD-10-CM | POA: Diagnosis not present

## 2017-07-03 DIAGNOSIS — G4733 Obstructive sleep apnea (adult) (pediatric): Secondary | ICD-10-CM | POA: Diagnosis not present

## 2017-07-03 DIAGNOSIS — E1169 Type 2 diabetes mellitus with other specified complication: Secondary | ICD-10-CM | POA: Diagnosis not present

## 2017-07-03 DIAGNOSIS — M86171 Other acute osteomyelitis, right ankle and foot: Secondary | ICD-10-CM | POA: Diagnosis not present

## 2017-07-03 DIAGNOSIS — R7881 Bacteremia: Secondary | ICD-10-CM | POA: Diagnosis not present

## 2017-07-03 DIAGNOSIS — Z452 Encounter for adjustment and management of vascular access device: Secondary | ICD-10-CM | POA: Diagnosis not present

## 2017-07-03 DIAGNOSIS — E1142 Type 2 diabetes mellitus with diabetic polyneuropathy: Secondary | ICD-10-CM | POA: Diagnosis not present

## 2017-07-03 DIAGNOSIS — T8189XA Other complications of procedures, not elsewhere classified, initial encounter: Secondary | ICD-10-CM | POA: Diagnosis not present

## 2017-07-03 DIAGNOSIS — B0052 Herpesviral keratitis: Secondary | ICD-10-CM | POA: Diagnosis not present

## 2017-07-03 DIAGNOSIS — L03115 Cellulitis of right lower limb: Secondary | ICD-10-CM | POA: Diagnosis not present

## 2017-07-04 DIAGNOSIS — T8189XA Other complications of procedures, not elsewhere classified, initial encounter: Secondary | ICD-10-CM | POA: Diagnosis not present

## 2017-07-05 DIAGNOSIS — E1142 Type 2 diabetes mellitus with diabetic polyneuropathy: Secondary | ICD-10-CM | POA: Diagnosis not present

## 2017-07-05 DIAGNOSIS — Z792 Long term (current) use of antibiotics: Secondary | ICD-10-CM | POA: Diagnosis not present

## 2017-07-05 DIAGNOSIS — E1169 Type 2 diabetes mellitus with other specified complication: Secondary | ICD-10-CM | POA: Diagnosis not present

## 2017-07-05 DIAGNOSIS — L03115 Cellulitis of right lower limb: Secondary | ICD-10-CM | POA: Diagnosis not present

## 2017-07-05 DIAGNOSIS — B0052 Herpesviral keratitis: Secondary | ICD-10-CM | POA: Diagnosis not present

## 2017-07-05 DIAGNOSIS — T8189XA Other complications of procedures, not elsewhere classified, initial encounter: Secondary | ICD-10-CM | POA: Diagnosis not present

## 2017-07-05 DIAGNOSIS — M86171 Other acute osteomyelitis, right ankle and foot: Secondary | ICD-10-CM | POA: Diagnosis not present

## 2017-07-05 DIAGNOSIS — R7881 Bacteremia: Secondary | ICD-10-CM | POA: Diagnosis not present

## 2017-07-05 DIAGNOSIS — G4733 Obstructive sleep apnea (adult) (pediatric): Secondary | ICD-10-CM | POA: Diagnosis not present

## 2017-07-05 DIAGNOSIS — B9561 Methicillin susceptible Staphylococcus aureus infection as the cause of diseases classified elsewhere: Secondary | ICD-10-CM | POA: Diagnosis not present

## 2017-07-05 DIAGNOSIS — Z452 Encounter for adjustment and management of vascular access device: Secondary | ICD-10-CM | POA: Diagnosis not present

## 2017-07-05 DIAGNOSIS — Z7982 Long term (current) use of aspirin: Secondary | ICD-10-CM | POA: Diagnosis not present

## 2017-07-05 DIAGNOSIS — T8743 Infection of amputation stump, right lower extremity: Secondary | ICD-10-CM | POA: Diagnosis not present

## 2017-07-06 ENCOUNTER — Encounter: Payer: BLUE CROSS/BLUE SHIELD | Attending: Surgery | Admitting: Surgery

## 2017-07-06 DIAGNOSIS — Z6833 Body mass index (BMI) 33.0-33.9, adult: Secondary | ICD-10-CM | POA: Diagnosis not present

## 2017-07-06 DIAGNOSIS — T8781 Dehiscence of amputation stump: Secondary | ICD-10-CM | POA: Diagnosis not present

## 2017-07-06 DIAGNOSIS — G4733 Obstructive sleep apnea (adult) (pediatric): Secondary | ICD-10-CM | POA: Diagnosis not present

## 2017-07-06 DIAGNOSIS — Z452 Encounter for adjustment and management of vascular access device: Secondary | ICD-10-CM | POA: Diagnosis not present

## 2017-07-06 DIAGNOSIS — L97512 Non-pressure chronic ulcer of other part of right foot with fat layer exposed: Secondary | ICD-10-CM | POA: Insufficient documentation

## 2017-07-06 DIAGNOSIS — Z7982 Long term (current) use of aspirin: Secondary | ICD-10-CM | POA: Diagnosis not present

## 2017-07-06 DIAGNOSIS — T8743 Infection of amputation stump, right lower extremity: Secondary | ICD-10-CM | POA: Diagnosis not present

## 2017-07-06 DIAGNOSIS — Y839 Surgical procedure, unspecified as the cause of abnormal reaction of the patient, or of later complication, without mention of misadventure at the time of the procedure: Secondary | ICD-10-CM | POA: Diagnosis not present

## 2017-07-06 DIAGNOSIS — E1169 Type 2 diabetes mellitus with other specified complication: Secondary | ICD-10-CM | POA: Diagnosis not present

## 2017-07-06 DIAGNOSIS — B9561 Methicillin susceptible Staphylococcus aureus infection as the cause of diseases classified elsewhere: Secondary | ICD-10-CM | POA: Diagnosis not present

## 2017-07-06 DIAGNOSIS — E669 Obesity, unspecified: Secondary | ICD-10-CM | POA: Diagnosis not present

## 2017-07-06 DIAGNOSIS — E11621 Type 2 diabetes mellitus with foot ulcer: Secondary | ICD-10-CM | POA: Insufficient documentation

## 2017-07-06 DIAGNOSIS — E11610 Type 2 diabetes mellitus with diabetic neuropathic arthropathy: Secondary | ICD-10-CM | POA: Insufficient documentation

## 2017-07-06 DIAGNOSIS — L03115 Cellulitis of right lower limb: Secondary | ICD-10-CM | POA: Diagnosis not present

## 2017-07-06 DIAGNOSIS — Z792 Long term (current) use of antibiotics: Secondary | ICD-10-CM | POA: Diagnosis not present

## 2017-07-06 DIAGNOSIS — T8189XA Other complications of procedures, not elsewhere classified, initial encounter: Secondary | ICD-10-CM | POA: Diagnosis not present

## 2017-07-06 DIAGNOSIS — R7881 Bacteremia: Secondary | ICD-10-CM | POA: Diagnosis not present

## 2017-07-06 DIAGNOSIS — M86171 Other acute osteomyelitis, right ankle and foot: Secondary | ICD-10-CM | POA: Diagnosis not present

## 2017-07-06 DIAGNOSIS — G4730 Sleep apnea, unspecified: Secondary | ICD-10-CM | POA: Insufficient documentation

## 2017-07-06 DIAGNOSIS — B0052 Herpesviral keratitis: Secondary | ICD-10-CM | POA: Diagnosis not present

## 2017-07-06 DIAGNOSIS — E1142 Type 2 diabetes mellitus with diabetic polyneuropathy: Secondary | ICD-10-CM | POA: Diagnosis not present

## 2017-07-07 DIAGNOSIS — Z89421 Acquired absence of other right toe(s): Secondary | ICD-10-CM | POA: Diagnosis not present

## 2017-07-07 DIAGNOSIS — L97512 Non-pressure chronic ulcer of other part of right foot with fat layer exposed: Secondary | ICD-10-CM | POA: Diagnosis not present

## 2017-07-07 DIAGNOSIS — T8189XA Other complications of procedures, not elsewhere classified, initial encounter: Secondary | ICD-10-CM | POA: Diagnosis not present

## 2017-07-07 DIAGNOSIS — L97419 Non-pressure chronic ulcer of right heel and midfoot with unspecified severity: Secondary | ICD-10-CM | POA: Diagnosis not present

## 2017-07-07 DIAGNOSIS — L98498 Non-pressure chronic ulcer of skin of other sites with other specified severity: Secondary | ICD-10-CM | POA: Diagnosis not present

## 2017-07-07 DIAGNOSIS — Z89411 Acquired absence of right great toe: Secondary | ICD-10-CM | POA: Diagnosis not present

## 2017-07-07 DIAGNOSIS — Z09 Encounter for follow-up examination after completed treatment for conditions other than malignant neoplasm: Secondary | ICD-10-CM | POA: Diagnosis not present

## 2017-07-07 IMAGING — CT CT HEAD W/O CM
1 of 2 series · 16 of 30 positions shown, 20 images · non-contrast
Comparison: None.

CLINICAL DATA: Dizziness and nausea. Symptoms began while sitting
in a meeting. Frontal headache.

EXAM:
CT HEAD WITHOUT CONTRAST
TECHNIQUE: Contiguous axial images were obtained from the base of the skull
through the vertex without intravenous contrast.

[Series 2: head wo · axial · 0.45mm/px · z∈[-209,-83]mm · 16 of 32 slices shown, 20 images]
[im 2/32  brain]
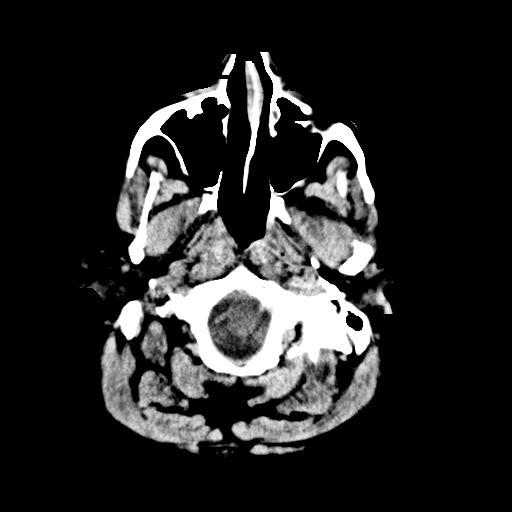
[im 2/32  bone]
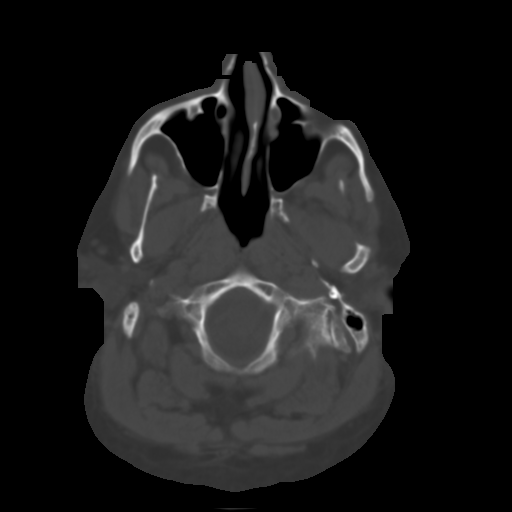
[im 3/32  brain]
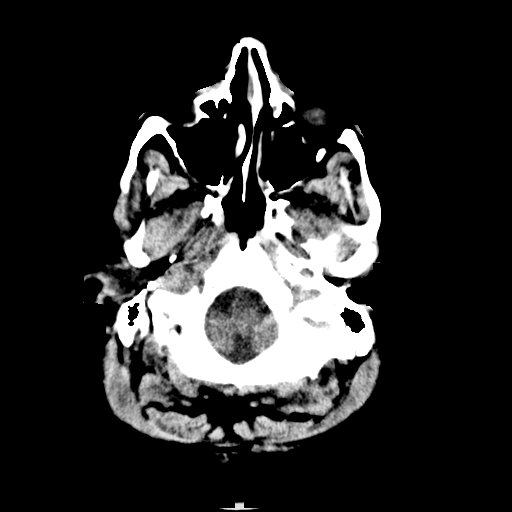
[im 6/32  brain]
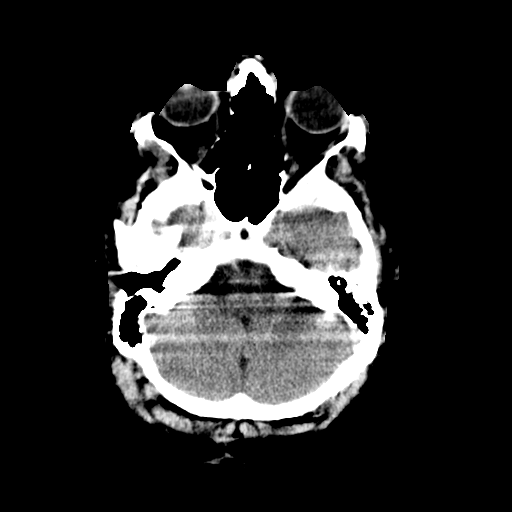
[im 8/32  brain]
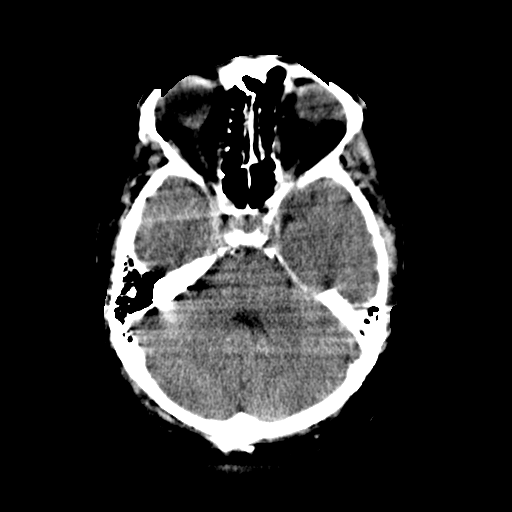
[im 9/32  brain]
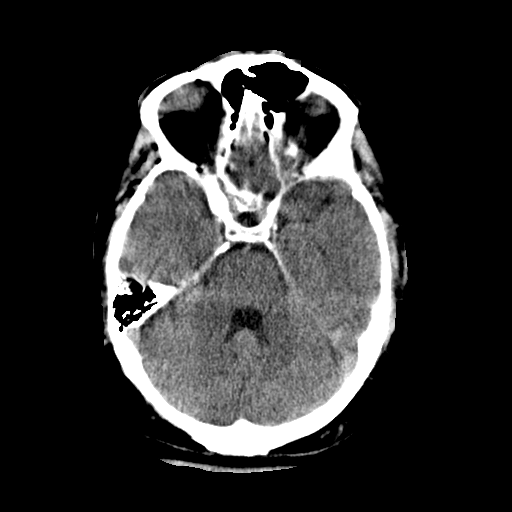
[im 9/32  bone]
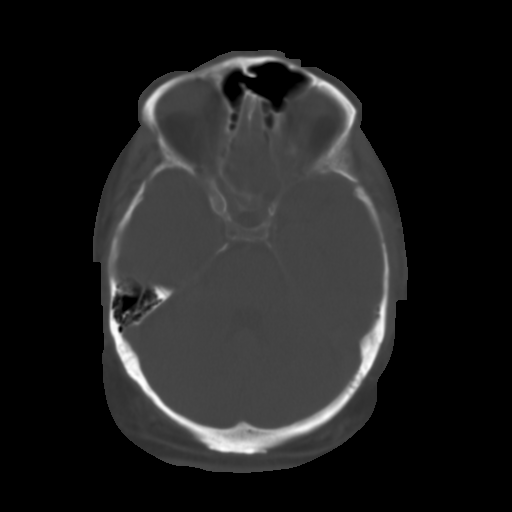
[im 11/32  brain]
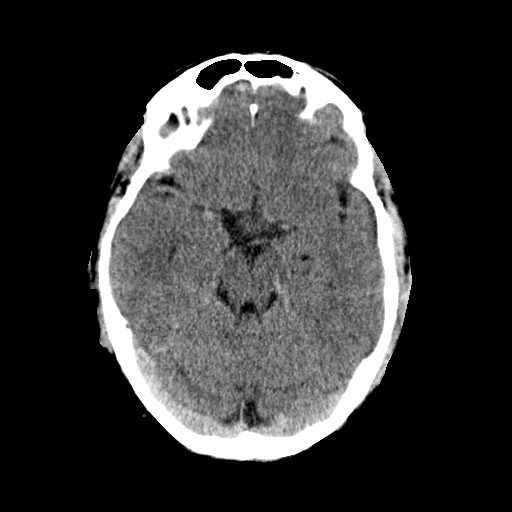
[im 14/32  brain]
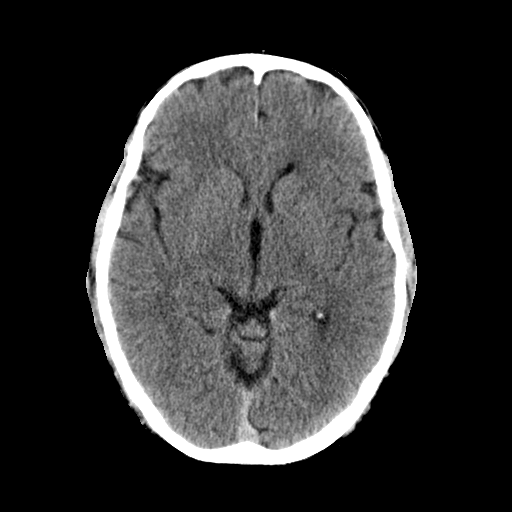
[im 15/32  brain]
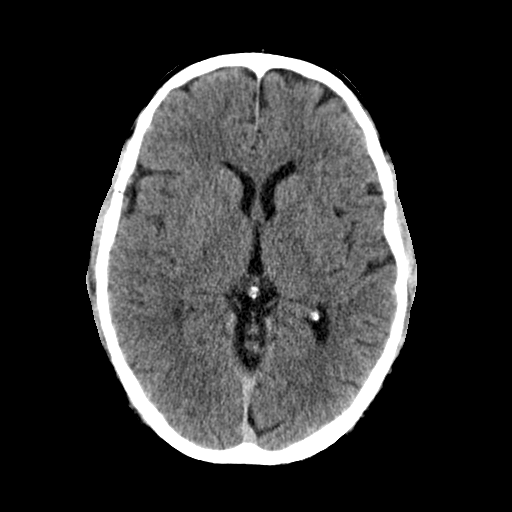
[im 17/32  brain]
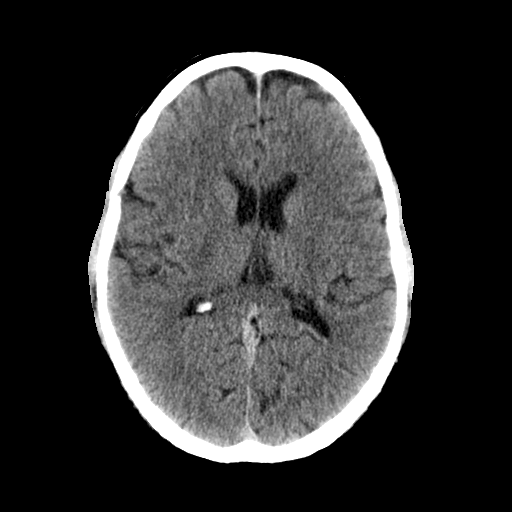
[im 17/32  bone]
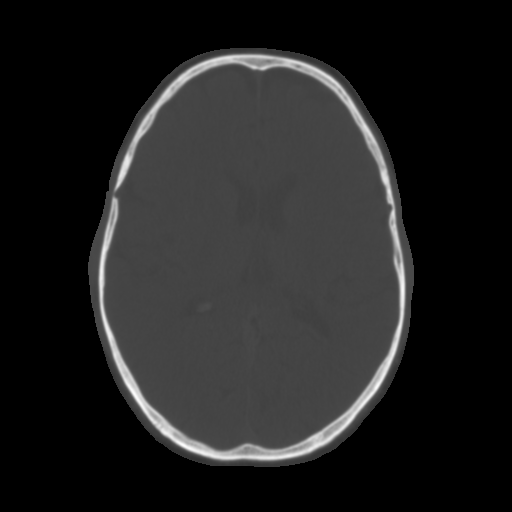
[im 18/32  brain]
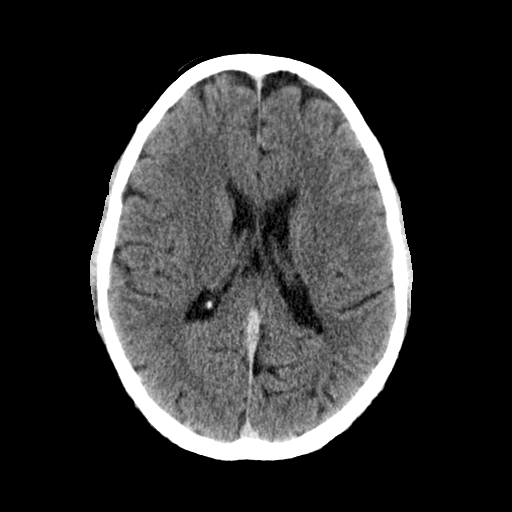
[im 21/32  brain]
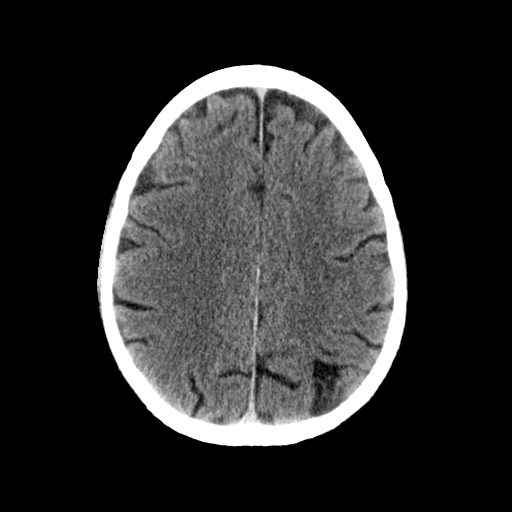
[im 23/32  brain]
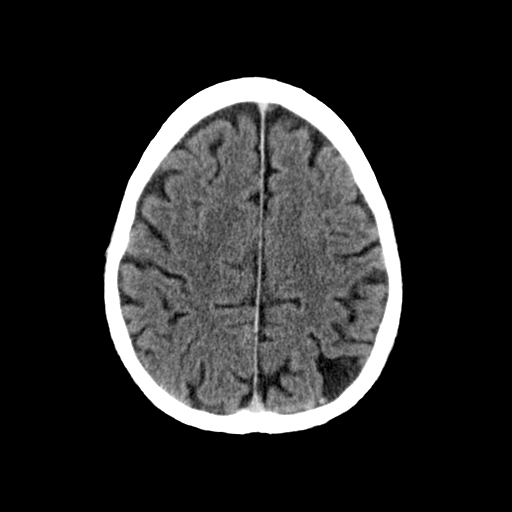
[im 24/32  brain]
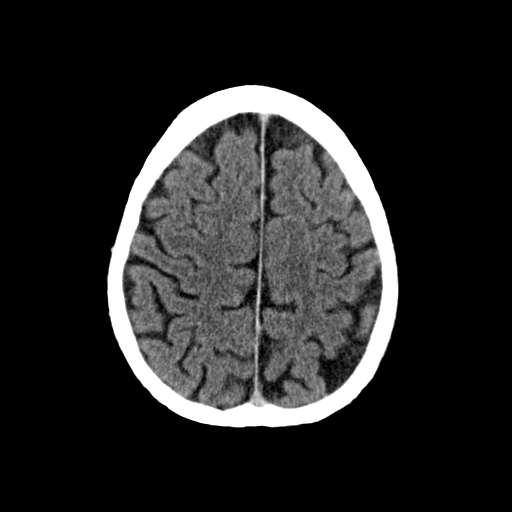
[im 24/32  bone]
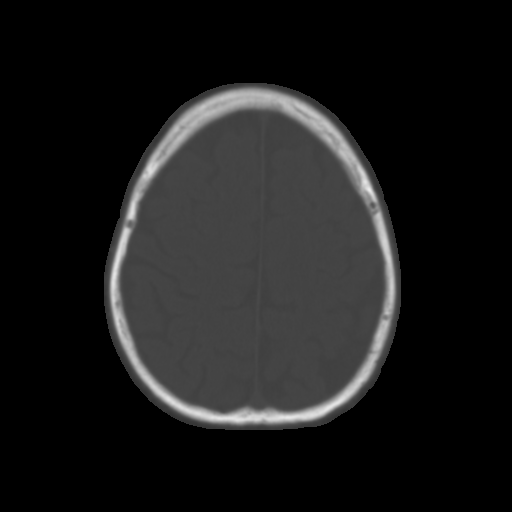
[im 26/32  brain]
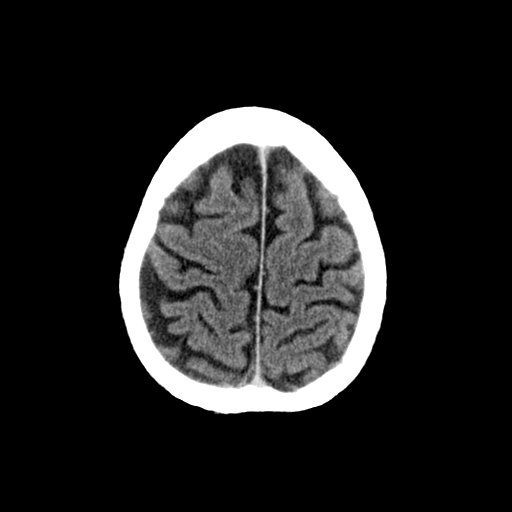
[im 29/32  brain]
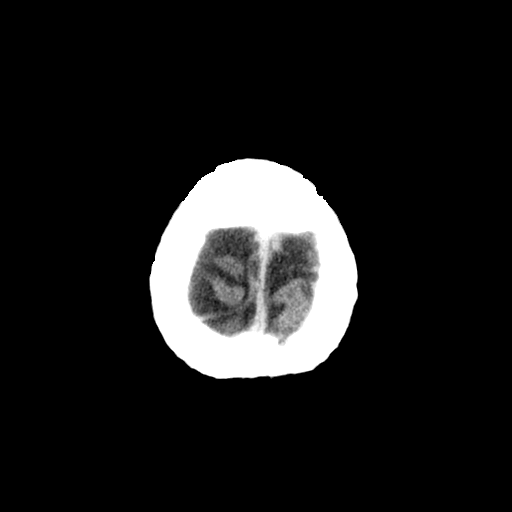
[im 30/32  brain]
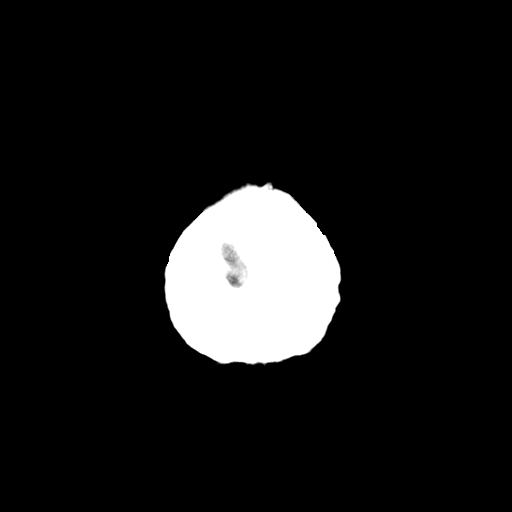

[16 of 30 positions shown; findings below may reference images not displayed]

FINDINGS: Small focal area of encephalomalacia in the left posterior parietal
region. Ventricles and sulci are otherwise symmetrical. No
ventricular dilatation. No abnormal extra-axial fluid collections.
Gray-white matter junctions are distinct. Basal cisterns are not
effaced. No acute intracranial hemorrhage. Calvarium appears intact.
Visualized paranasal sinuses and mastoid air cells are not
opacified.
IMPRESSION: No acute intracranial abnormalities. Old focal encephalomalacia in
the left posterior parietal region.

## 2017-07-08 DIAGNOSIS — T8189XA Other complications of procedures, not elsewhere classified, initial encounter: Secondary | ICD-10-CM | POA: Diagnosis not present

## 2017-07-08 NOTE — Progress Notes (Signed)
GAHEL, SAFLEY ([id number]) Visit Report for 07/06/2017 Chief Complaint Document Details Patient Name: Bones, Wilberto C. Date of Service: 07/06/2017 12:30 PM Medical Record Number: [id number] Patient Account Number: [id number] Date of Birth/Sex: 1966-01-18 (51 y.o. Male) Treating RN: Cornell Barman Primary Care Provider: Deborra Medina Other Clinician: Referring Provider: Referral, Self Treating Provider/Extender: Frann Rider in Treatment: 0 Information Obtained from: Patient Chief Complaint Patients presents for treatment of an open diabetic ulcer to the right plantar foot in the region of his right TMA which she's had since August 2018 Electronic Signature(s) Signed: 07/06/2017 2:47:18 PM By: Christin Fudge MD, FACS Entered By: Christin Fudge on 07/06/2017 14:47:18 Kemmer, Rayburn Felt ([id number]) -------------------------------------------------------------------------------- HPI Details Patient Name: Turck, Keisean C. Date of Service: 07/06/2017 12:30 PM Medical Record Number: [id number] Patient Account Number: [id number] Date of Birth/Sex: 1966-05-27 (51 y.o. Male) Treating RN: Cornell Barman Primary Care Provider: Deborra Medina Other Clinician: Referring Provider: Referral, Self Treating Provider/Extender: Frann Rider in Treatment: 0 History of Present Illness HPI Description: 51 year old male who has been under the care of the podiatry surgeons and wound care at [facility] is here for a second opinion today. He is seen in [place] by his PCP Dr. Derrel Nip, and was last seen by her in February 2018 with a diagnosis of neuropathy of both feet, prediabetes, hyperlipidemia, vitamin D deficiency, prostate cancer screening, type 2 diabetes mellitus with other circulatory complications, obesity and reactive depression. he was treated in the [facility] with surgery on his foot and is status post transmetatarsal amputation of the right foot in early November due to nonhealing ulcer. ABIs were  more than 1 in October at [facility]. he had problems with wound healing and has been using a scooter and offloading shoe. He is under the care of physicians at [facility] and sees podiatry, infectious disease and wound care. they have been seeing him with a drop foot gait, obesity, peripheral neuropathy, diabetic ulcer to the right midfoot associated with type 2 diabetes mellitus with fat layer exposed, status post transmetatarsal amputation of right foot and a open wound on the right foot. after examination the clinical impression was that of an acute cellulitis with right diabetic foot ulcer with high probability of osteomyelitis status post transmetatarsal amputation. in early August he was admitted for ascending cellulitis and told to come in for IV antibiotics and surgical debridement of the ulcer. his most recent podiatry visit on 06/19/2017, he was seen for an ulcer of his right foot and x-rays were done to evaluate for osteomyelitis. wound VAC to the right foot was applied with Endo Foreman collagen daily. the wound was debrided of all devitalized and necrotic tissue including subcutaneous tissue. the podiatrist discuss surgical options in the form of a rotation flap, debridement with transposition of flap, tendo Achillis lengthening, tendon transfer procedures. he has also been seen by infectious disease, for an MSSA bacteremia, infected DFU with osteomyelitis of the right foot. the patient was on IV Invanz in September of this year for his chronic problems with osteomyelitis. his most recent x-ray is going to be repeated tomorrow and he is going to continue with seeing the podiatry group and the wound care group at [facility]) Signed: 07/06/2017 2:49:08 PM By: Christin Fudge MD, FACS Previous Signature: 07/06/2017 1:23:44 PM Version By: Christin Fudge MD, FACS Entered By: Christin Fudge on 07/06/2017 14:49:08 Seelman, Rayburn Felt  ([id number]) -------------------------------------------------------------------------------- Physical Exam Details Patient Name: Matzke, Aqil C. Date of Service: 07/06/2017 12:30  PM Medical Record Number: [id number] Patient Account Number: [id number] Date of Birth/Sex: 05/19/1966 (51 y.o. Male) Treating RN: Cornell Barman Primary Care Provider: Deborra Medina Other Clinician: Referring Provider: Referral, Self Treating Provider/Extender: Frann Rider in Treatment: 0 Constitutional . Pulse regular. Respirations normal and unlabored. Afebrile. . Eyes Nonicteric. Reactive to light. Ears, Nose, Mouth, and Throat Lips, teeth, and gums WNL.Marland Kitchen Moist mucosa without lesions. Neck supple and nontender. No palpable supraclavicular or cervical adenopathy. Normal sized without goiter. Respiratory WNL. No retractions.. Cardiovascular Pedal Pulses WNL. ABI on the right was 1.39. No clubbing, cyanosis or edema. Gastrointestinal (GI) Abdomen without masses or tenderness.. No liver or spleen enlargement or tenderness.. Lymphatic No adneopathy. No adenopathy. No adenopathy. Musculoskeletal Adexa without tenderness or enlargement.. Digits and nails w/o clubbing, cyanosis, infection, petechiae, ischemia, or inflammatory conditions.. Integumentary (Hair, Skin) No suspicious lesions. No crepitus or fluctuance. No peri-wound warmth or erythema. No masses.Marland Kitchen Psychiatric Judgement and insight Intact.. No evidence of depression, anxiety, or agitation.. Notes his right TMA site has healed but just proximal to this he has a open ulceration which has healthy granulation tissue is an almost to the surface. He will be reaching a stage where he sold where his wound VAC can be discontinued. No debridement was required. the wound does not probe down to bone Electronic Signature(s) Signed: 07/06/2017 2:50:15 PM By: Christin Fudge MD, FACS Entered By: Christin Fudge on 07/06/2017 14:50:15 Newcomer, Rayburn Felt  ([id number]) -------------------------------------------------------------------------------- Physician Orders Details Patient Name: Merrihew, Kainoa C. Date of Service: 07/06/2017 12:30 PM Medical Record Number: [id number] Patient Account Number: [id number] Date of Birth/Sex: 1966-07-01 (51 y.o. Male) Treating RN: Roger Shelter Primary Care Provider: Deborra Medina Other Clinician: Referring Provider: Referral, Self Treating Provider/Extender: Frann Rider in Treatment: 0 Verbal / Phone Orders: No Diagnosis Coding Discharge From [facility] Services Wound #1 [place] o Discharge from [place] Signature(s) Signed: 07/06/2017 4:32:33 PM By: Christin Fudge MD, FACS Signed: 07/06/2017 4:46:29 PM By: Roger Shelter Entered By: Roger Shelter on 07/06/2017 13:52:22 Jabbour, Rayburn Felt ([id number]) -------------------------------------------------------------------------------- Problem List Details Patient Name: Staat, Nery C. Date of Service: 07/06/2017 12:30 PM Medical Record Number: [id number] Patient Account Number: [id number] Date of Birth/Sex: 1966/08/19 (51 y.o. Male) Treating RN: Cornell Barman Primary Care Provider: Deborra Medina Other Clinician: Referring Provider: Referral, Self Treating Provider/Extender: Frann Rider in Treatment: 0 Active Problems ICD-10 Encounter Code Description Active Date Diagnosis E11.621 Type 2 diabetes mellitus with foot ulcer 07/06/2017 Yes L97.512 Non-pressure chronic ulcer of other part of right foot with fat layer 07/06/2017 Yes exposed T87.81 Dehiscence of amputation stump 07/06/2017 Yes E11.610 Type 2 diabetes mellitus with diabetic neuropathic arthropathy 07/06/2017 Yes Inactive Problems Resolved Problems Electronic Signature(s) Signed: 07/06/2017 2:46:41 PM By: Christin Fudge MD, FACS Previous Signature: 07/06/2017 2:17:10 PM Version By: Christin Fudge MD, FACS Entered By: Christin Fudge on 07/06/2017  14:46:41 Storlie, Rayburn Felt ([id number]) -------------------------------------------------------------------------------- Progress Note Details Patient Name: Brodrick, Matt C. Date of Service: 07/06/2017 12:30 PM Medical Record Number: [id number] Patient Account Number: [id number] Date of Birth/Sex: 04-Oct-1965 (51 y.o. Male) Treating RN: Cornell Barman Primary Care Provider: Deborra Medina Other Clinician: Referring Provider: Referral, Self Treating Provider/Extender: Frann Rider in Treatment: 0 Subjective Chief Complaint Information obtained from Patient Patients presents for treatment of an open diabetic ulcer to the right plantar foot in the region of his right TMA which she's had since August 2018 History of Present Illness (HPI) 51 year old male who has been under the care of the podiatry  surgeons and wound care at [facility] is here for a second opinion today. He is seen in [place] by his PCP Dr. Derrel Nip, and was last seen by her in February 2018 with a diagnosis of neuropathy of both feet, prediabetes, hyperlipidemia, vitamin D deficiency, prostate cancer screening, type 2 diabetes mellitus with other circulatory complications, obesity and reactive depression. he was treated in the [facility] with surgery on his foot and is status post transmetatarsal amputation of the right foot in early November due to nonhealing ulcer. ABIs were more than 1 in October at [facility]. he had problems with wound healing and has been using a scooter and offloading shoe. He is under the care of physicians at [facility] and sees podiatry, infectious disease and wound care. they have been seeing him with a drop foot gait, obesity, peripheral neuropathy, diabetic ulcer to the right midfoot associated with type 2 diabetes mellitus with fat layer exposed, status post transmetatarsal amputation of right foot and a open wound on the right foot. after examination the clinical impression was that of an acute  cellulitis with right diabetic foot ulcer with high probability of osteomyelitis status post transmetatarsal amputation. in early August he was admitted for ascending cellulitis and told to come in for IV antibiotics and surgical debridement of the ulcer. his most recent podiatry visit on 06/19/2017, he was seen for an ulcer of his right foot and x-rays were done to evaluate for osteomyelitis. wound VAC to the right foot was applied with Endo Foreman collagen daily. the wound was debrided of all devitalized and necrotic tissue including subcutaneous tissue. the podiatrist discuss surgical options in the form of a rotation flap, debridement with transposition of flap, tendo Achillis lengthening, tendon transfer procedures. he has also been seen by infectious disease, for an MSSA bacteremia, infected DFU with osteomyelitis of the right foot. the patient was on IV Invanz in September of this year for his chronic problems with osteomyelitis. his most recent x-ray is going to be repeated tomorrow and he is going to continue with seeing the podiatry group and the wound care group at [facility] Patient presents with 1 open wound that has been present for approximately 3 months. Patient has been treating wound in the following manner: 3 moinths. Laboratory tests have not been performed in the last month. Patient reportedly has tested positive for an antibiotic resistant organism. Patient reportedly has tested positive for osteomyelitis. Patient experiences the following problems associated with their wounds: infection, swelling. Patient History Information obtained from Patient. Allergies No Known Drug Allergies Family History Diabetes - Mother,Father, Heart Disease - Mother,Father,Maternal Grandparents,Paternal Grandparents, Lung Disease - Father, Stroke - Father, Thyroid Problems - Mother, STACY, DESHLER ([id number]) No family history of Cancer, Hypertension, Kidney Disease,  Seizures, Tuberculosis. Social History Never smoker, Marital Status - Married, Alcohol Use - Never, Drug Use - No History, Caffeine Use - Never. Medical History Eyes Denies history of Cataracts, Glaucoma, Optic Neuritis Ear/Nose/Mouth/Throat Denies history of Chronic sinus problems/congestion, Middle ear problems Hematologic/Lymphatic Denies history of Anemia, Hemophilia, Human Immunodeficiency Virus, Lymphedema, Sickle Cell Disease Respiratory Patient has history of Sleep Apnea Denies history of Aspiration, Asthma, Chronic Obstructive Pulmonary Disease (COPD), Pneumothorax, Tuberculosis Cardiovascular Denies history of Angina, Arrhythmia, Congestive Heart Failure, Coronary Artery Disease, Deep Vein Thrombosis, Hypertension, Hypotension, Myocardial Infarction, Peripheral Arterial Disease, Peripheral Venous Disease, Phlebitis, Vasculitis Gastrointestinal Denies history of Cirrhosis , Colitis, Crohn s, Hepatitis A, Hepatitis B, Hepatitis C Endocrine Denies history of Type  I Diabetes, Type II Diabetes Genitourinary Denies history of End Stage Renal Disease Immunological Denies history of Lupus Erythematosus, Raynaud s, Scleroderma Integumentary (Skin) Denies history of History of Burn, History of pressure wounds Musculoskeletal Patient has history of Osteomyelitis Denies history of Gout, Rheumatoid Arthritis, Osteoarthritis Neurologic Denies history of Dementia, Neuropathy, Quadriplegia, Paraplegia, Seizure Disorder Oncologic Denies history of Received Chemotherapy, Received Radiation Psychiatric Denies history of Anorexia/bulimia, Confinement Anxiety Review of Systems (ROS) Constitutional Symptoms (General Health) Denies complaints or symptoms of Fatigue, Fever, Chills, Marked Weight Change. Eyes Complains or has symptoms of Glasses / Contacts - gblasses. Denies complaints or symptoms of Dry Eyes, Vision Changes. Ear/Nose/Mouth/Throat Denies complaints or symptoms of  Difficult clearing ears, Sinusitis. Hematologic/Lymphatic Denies complaints or symptoms of Bleeding / Clotting Disorders, Human Immunodeficiency Virus. Respiratory Denies complaints or symptoms of Chronic or frequent coughs, Shortness of Breath. Cardiovascular Denies complaints or symptoms of Chest pain, LE edema. Gastrointestinal Denies complaints or symptoms of Frequent diarrhea, Nausea, Vomiting. Endocrine Denies complaints or symptoms of Hepatitis, Thyroid disease, Polydypsia (Excessive Thirst). Genitourinary Denies complaints or symptoms of Kidney failure/ Dialysis, Incontinence/dribbling. Immunological Sundeen, JAHSHUA BONITO. ([id number]) Denies complaints or symptoms of Hives, Itching. Integumentary (Skin) Complains or has symptoms of Wounds. Denies complaints or symptoms of Bleeding or bruising tendency, Breakdown, Swelling. Musculoskeletal Denies complaints or symptoms of Muscle Pain, Muscle Weakness. Neurologic Denies complaints or symptoms of Numbness/parasthesias, Focal/Weakness. Psychiatric Denies complaints or symptoms of Anxiety, Claustrophobia. Medications Lyrica 75 mg capsule oral capsule oral acyclovir 400 mg tablet oral tablet oral Multiple Vitamin-Minerals tablet oral tablet oral ascorbic acid (vitamin C) 100 mg tablet oral tablet oral Objective Constitutional Pulse regular. Respirations normal and unlabored. Afebrile. Vitals Time Taken: 12:45 PM, Height: 70 in, Source: Stated, Weight: 235 lbs, Source: Stated, BMI: 33.7, Temperature: 97.87  F, Pulse: 87 bpm, Respiratory Rate: 18 breaths/min, Blood Pressure: 148/75 mmHg. Eyes Nonicteric. Reactive to light. Ears, Nose, Mouth, and Throat Lips, teeth, and gums WNL.Marland Kitchen Moist mucosa without lesions. Neck supple and nontender. No palpable supraclavicular or cervical adenopathy. Normal sized without goiter. Respiratory WNL. No retractions.. Cardiovascular Pedal Pulses WNL. ABI on the right was 1.39. No clubbing,  cyanosis or edema. Gastrointestinal (GI) Abdomen without masses or tenderness.. No liver or spleen enlargement or tenderness.. Lymphatic No adneopathy. No adenopathy. No adenopathy. Musculoskeletal Adexa without tenderness or enlargement.. Digits and nails w/o clubbing, cyanosis, infection, petechiae, ischemia, or Demarest, Hildred C. ([id number]) inflammatory conditions.Marland Kitchen Psychiatric Judgement and insight Intact.. No evidence of depression, anxiety, or agitation.. General Notes: his right TMA site has healed but just proximal to this he has a open ulceration which has healthy granulation tissue is an almost to the surface. He will be reaching a stage where he sold where his wound VAC can be discontinued. No debridement was required. the wound does not probe down to bone Integumentary (Hair, Skin) No suspicious lesions. No crepitus or fluctuance. No peri-wound warmth or erythema. No masses.. Wound #1 status is Open. Original cause of wound was Surgical Injury. The wound is located on the [place]. The wound measures 3.2cm length x 2.1cm width x 0.2cm depth; 5.278cm^2 area and 1.056cm^3 volume. There is Fat Layer (Subcutaneous Tissue) Exposed exposed. There is no tunneling or undermining noted. There is a medium amount of serosanguineous drainage noted. The wound margin is flat and intact. There is large (67-100%) red granulation within the wound bed. The periwound skin appearance did not exhibit: Callus, Crepitus, Excoriation, Induration, Rash, Scarring, Dry/Scaly, Maceration, Atrophie Blanche, Cyanosis, Ecchymosis, Hemosiderin Staining, Mottled,  Pallor, Rubor, Erythema. Assessment Active Problems ICD-10 E11.621 - Type 2 diabetes mellitus with foot ulcer L97.512 - Non-pressure chronic ulcer of other part of right foot with fat layer exposed T87.81 - Dehiscence of amputation stump E11.610 - Type 2 diabetes mellitus with diabetic neuropathic arthropathy 51 year old male, who has  received excellent care at [facility], under the care of podiatry, infectious disease and wound care. He is here for a second opinion regarding his care and I have clearly stated to him as far as wound care goes, he has had excellent resolution of his problems over a period of time. There is a questionable remanent bony infection, which may be a early osteomyelitis and he is going to get another x-ray to review this. If there is further doubt, he may benefit from an MRI. If osteomyelitis is diagnosed then, between infectious disease and wound care, he may need prolonged IV antibiotic, therapy including hyperbaric oxygen therapy. As far as his best surgical options, as delineated by his podiatrist, he may benefit from a free flap, local rotation flap or a plastic procedure to cover this wound most effectively and ensure that the wound remains healed. As far as his tendon lengthening procedure and other specific podiatry maneuvers to treat his foot drop, I have no opinion regarding this, as this is beyond the scope of my practice. In the big picture, I have emphasized to the patient and his wife that he is in excellent hands, at the [facility] and I would highly recommend he continues to work with them towards resolution of his complex problem. He and his wife have read all questions answered and will continue seeing his team of physicians at [facility] ZARIAH, JOST ([id number]) Discharge From [facility] Services: Wound #1 Right,Plantar Foot: Discharge from [place] 51 year old male, who has received excellent care at [facility], under the care of podiatry, infectious disease and wound care. He is here for a second opinion regarding his care and I have clearly stated to him as far as wound care goes, he has had excellent resolution of his problems over a period of time. There is a questionable remanent bony infection, which may be a early osteomyelitis and  he is going to get another x-ray to review this. If there is further doubt, he may benefit from an MRI. If osteomyelitis is diagnosed then, between infectious disease and wound care, he may need prolonged IV antibiotic, therapy including hyperbaric oxygen therapy. As far as his best surgical options, as delineated by his podiatrist, he may benefit from a free flap, local rotation flap or a plastic procedure to cover this wound most effectively and ensure that the wound remains healed. As far as his tendon lengthening procedure and other specific podiatry maneuvers to treat his foot drop, I have no opinion regarding this, as this is beyond the scope of my practice. In the big picture, I have emphasized to the patient and his wife that he is in excellent hands, at the [facility] and I would highly recommend he continues to work with them towards resolution of his complex problem. He and his wife have read all questions answered and will continue seeing his team of physicians at [place] Signature(s) Signed: 07/06/2017 2:58:39 PM By: Christin Fudge MD, FACS Entered By: Christin Fudge on 07/06/2017 14:58:39 Brian, Rayburn Felt ([id number]) -------------------------------------------------------------------------------- ROS/PFSH Details Patient Name: Wrobel, Harce C. Date of Service: 07/06/2017 12:30 PM Medical  Record Number: [id number] Patient Account Number: [id number] Date of Birth/Sex: Jun 19, 1966 (51 y.o. Male) Treating RN: Roger Shelter Primary Care Provider: Deborra Medina Other Clinician: Referring Provider: Referral, Self Treating Provider/Extender: Frann Rider in Treatment: 0 Information Obtained From Patient Wound History Do you currently have one or more open woundso Yes How many open wounds do you currently haveo 1 Approximately how long have you had your woundso 3 months How have you been treating your wound(s) until nowo 3 moinths Has your wound(s) ever  healed and then re-openedo No Have you had any lab work done in the past montho No Have you tested positive for an antibiotic resistant organism (MRSA, VRE)o Yes Date: 12/26/2016 Have you tested positive for osteomyelitis (bone infection)o Yes Date: 12/26/2016 Have you had other problems associated with your woundso Infection, Swelling Constitutional Symptoms (General Health) Complaints and Symptoms: Negative for: Fatigue; Fever; Chills; Marked Weight Change Eyes Complaints and Symptoms: Positive for: Glasses / Contacts - gblasses Negative for: Dry Eyes; Vision Changes Medical History: Negative for: Cataracts; Glaucoma; Optic Neuritis Ear/Nose/Mouth/Throat Complaints and Symptoms: Negative for: Difficult clearing ears; Sinusitis Medical History: Negative for: Chronic sinus problems/congestion; Middle ear problems Hematologic/Lymphatic Complaints and Symptoms: Negative for: Bleeding / Clotting Disorders; Human Immunodeficiency Virus Medical History: Negative for: Anemia; Hemophilia; Human Immunodeficiency Virus; Lymphedema; Sickle Cell Disease Respiratory Complaints and Symptoms: Negative for: Chronic or frequent coughs; Shortness of Breath Hemenway, Stepehn C. ([id number]) Medical History: Positive for: Sleep Apnea Negative for: Aspiration; Asthma; Chronic Obstructive Pulmonary Disease (COPD); Pneumothorax; Tuberculosis Cardiovascular Complaints and Symptoms: Negative for: Chest pain; LE edema Medical History: Negative for: Angina; Arrhythmia; Congestive Heart Failure; Coronary Artery Disease; Deep Vein Thrombosis; Hypertension; Hypotension; Myocardial Infarction; Peripheral Arterial Disease; Peripheral Venous Disease; Phlebitis; Vasculitis Gastrointestinal Complaints and Symptoms: Negative for: Frequent diarrhea; Nausea; Vomiting Medical History: Negative for: Cirrhosis ; Colitis; Crohnos; Hepatitis A; Hepatitis B; Hepatitis C Endocrine Complaints and Symptoms: Negative for:  Hepatitis; Thyroid disease; Polydypsia (Excessive Thirst) Medical History: Negative for: Type I Diabetes; Type II Diabetes Genitourinary Complaints and Symptoms: Negative for: Kidney failure/ Dialysis; Incontinence/dribbling Medical History: Negative for: End Stage Renal Disease Immunological Complaints and Symptoms: Negative for: Hives; Itching Medical History: Negative for: Lupus Erythematosus; Raynaudos; Scleroderma Integumentary (Skin) Complaints and Symptoms: Positive for: Wounds Negative for: Bleeding or bruising tendency; Breakdown; Swelling Medical History: Negative for: History of Burn; History of pressure wounds Musculoskeletal Complaints and Symptoms: Negative for: Muscle Pain; Muscle Weakness Givans, Nivaan C. ([id number]) Medical History: Positive for: Osteomyelitis Negative for: Gout; Rheumatoid Arthritis; Osteoarthritis Neurologic Complaints and Symptoms: Negative for: Numbness/parasthesias; Focal/Weakness Medical History: Negative for: Dementia; Neuropathy; Quadriplegia; Paraplegia; Seizure Disorder Psychiatric Complaints and Symptoms: Negative for: Anxiety; Claustrophobia Medical History: Negative for: Anorexia/bulimia; Confinement Anxiety Oncologic Medical History: Negative for: Received Chemotherapy; Received Radiation Immunizations Pneumococcal Vaccine: Received Pneumococcal Vaccination: No Implantable Devices Family and Social History Cancer: No; Diabetes: Yes - Mother,Father; Heart Disease: Yes - Mother,Father,Maternal Grandparents,Paternal Grandparents; Hypertension: No; Kidney Disease: No; Lung Disease: Yes - Father; Seizures: No; Stroke: Yes - Father; Thyroid Problems: Yes - Mother; Tuberculosis: No; Never smoker; Marital Status - Married; Alcohol Use: Never; Drug Use: No History; Caffeine Use: Never; Financial Concerns: No; Food, Clothing or Shelter Needs: No; Support System Lacking: No; Transportation Concerns: No; Advanced Directives: No;  Patient does not want information on Advanced Directives; Do not resuscitate: No; Living Will: No; Medical Power of Attorney: No Physician Affirmation I have reviewed and agree with the above information. Electronic Signature(s) Signed: 07/06/2017 4:32:33 PM By: Christin Fudge MD, FACS  Signed: 07/06/2017 4:46:29 PM By: Roger Shelter Entered By: Roger Shelter on 07/06/2017 13:12:35 Coggeshall, Rayburn Felt ([id number]) -------------------------------------------------------------------------------- SuperBill Details Patient Name: Dubree, Earnie C. Date of Service: 07/06/2017 Medical Record Number: [id number] Patient Account Number: [id number] Date of Birth/Sex: [birth year] (51 y.o. 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 Facility Procedures CPT4 Code: 93267124 Description: 99213 - WOUND CARE VISIT-LEV 3 EST PT Modifier: Quantity: 1 Physician Procedures CPT4 Code: 5809983 Description: 38250 - WC PHYS LEVEL 4 - NEW PT ICD-10 Diagnosis Description E11.621 Type 2 diabetes mellitus with foot ulcer 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

## 2017-07-08 NOTE — Progress Notes (Signed)
SEYED, HEFFLEY ([id number]) Visit Report for 07/06/2017 Abuse/Suicide Risk Screen Details Patient Name: [name], [name]. Date of Service: 07/06/2017 12:30 PM Medical Record Number: [id number] Patient Account Number: [id number] Date of Birth/Sex: Nov 09, 1965 (51 y.o. Male) Treating RN: Roger Shelter Primary Care Daisie Haft: Deborra Medina Other Clinician: Referring Jamonta Goerner: Referral, Self Treating Lucan Riner/Extender: Frann Rider in Treatment: 0 Abuse/Suicide Risk Screen Items Answer ABUSE/SUICIDE RISK SCREEN: Has anyone close to you tried to hurt or harm you recentlyo No Do you feel uncomfortable with anyone in your familyo No Has anyone forced you do things that you didnot want to doo No Do you have any thoughts of harming yourselfo No Patient displays signs or symptoms of abuse and/or neglect. No Electronic Signature(s) Signed: 07/06/2017 4:46:29 PM By: Roger Shelter Entered By: Roger Shelter on 07/06/2017 13:12:40 Reinitz, Rayburn Felt ([id number]) -------------------------------------------------------------------------------- Activities of Daily Living Details Patient Name: Penkala, Yoshiharu C. Date of Service: 07/06/2017 12:30 PM Medical Record Number: [id number] Patient Account Number: [id number] Date of Birth/Sex: [birth year] (51 y.o. Male) Treating RN: Roger Shelter Primary Care Kaytlin Burklow: Deborra Medina Other Clinician: Referring Dilara Navarrete: Referral, Self Treating Aroush Chasse/Extender: Frann Rider in Treatment: 0 Activities of Daily Living Items Answer Activities of Daily Living (Please select one for each item) Drive Automobile Completely Able Take Medications Completely Able Use Telephone Completely Able Care for Appearance Completely Able Use Toilet Completely Able Bath / Shower Completely Able Dress Self Completely Able Feed Self Completely Able Walk Completely Able Get In / Out Bed Completely Able Housework Completely Able Prepare Meals Completely  Able Handle Money Completely Able Shop for Self Completely Able Electronic Signature(s) Signed: 07/06/2017 4:46:29 PM By: Roger Shelter Entered By: Roger Shelter on 07/06/2017 13:12:53 Sulewski, Rayburn Felt ([id number]) -------------------------------------------------------------------------------- Education Assessment Details Patient Name: Vessell, Clayson C. Date of Service: 07/06/2017 12:30 PM Medical Record Number: [id number] Patient Account Number: [id number] Date of Birth/Sex: 10/05/1965 (51 y.o. Male) Treating RN: Roger Shelter Primary Care Marki Frede: Deborra Medina Other Clinician: Referring Infantof Villagomez: Referral, Self Treating Teandre Hamre/Extender: Frann Rider in Treatment: 0 Primary Learner Assessed: Patient Learning Preferences/Education Level/Primary Language Highest Education Level: College or Above Preferred Language: English Cognitive Barrier Assessment/Beliefs Language Barrier: No Translator Needed: No Memory Deficit: No Emotional Barrier: No Cultural/Religious Beliefs Affecting Medical Care: No Physical Barrier Assessment Impaired Vision: Yes Glasses Impaired Hearing: No Decreased Hand dexterity: No Knowledge/Comprehension Assessment Knowledge Level: High Comprehension Level: High Ability to understand written High instructions: Ability to understand verbal High instructions: Motivation Assessment Anxiety Level: Calm Cooperation: Cooperative Education Importance: Acknowledges Need Interest in Health Problems: Asks Questions Perception: Coherent Willingness to Engage in Self- High Management Activities: Readiness to Engage in Self- High Management Activities: Electronic Signature(s) Signed: 07/06/2017 4:46:29 PM By: Roger Shelter Entered By: Roger Shelter on 07/06/2017 13:12:58 Cedano, Rayburn Felt ([id number]) -------------------------------------------------------------------------------- Fall Risk Assessment Details Patient Name: Vanloan,  Nishanth C. Date of Service: 07/06/2017 12:30 PM Medical Record Number: [id number] Patient Account Number: [id number] Date of Birth/Sex: 05-16-1966 (51 y.o. Male) Treating RN: Roger Shelter Primary Care Tammi Boulier: Deborra Medina Other Clinician: Referring Tamula Morrical: Referral, Self Treating Dax Murguia/Extender: Frann Rider in Treatment: 0 Fall Risk Assessment Items Have you had 2 or more falls in the last 12 monthso 0 No Have you had any fall that resulted in injury in the last 12 monthso 0 No FALL RISK ASSESSMENT: History of falling - immediate or within 3 months 0 No Secondary diagnosis 0 No Ambulatory aid None/bed rest/wheelchair/nurse 0 No Crutches/cane/walker 0 No Furniture 0 No IV Access/Saline Lock 0 No  Gait/Training Normal/bed rest/immobile 0 No Weak 0 No Impaired 0 No Mental Status Oriented to own ability 0 No Electronic Signature(s) Signed: 07/06/2017 4:46:29 PM By: Roger Shelter Entered By: Roger Shelter on 07/06/2017 13:13:01 Capote, Rayburn Felt ([id number]) -------------------------------------------------------------------------------- Foot Assessment Details Patient Name: Ury, Tavyn C. Date of Service: 07/06/2017 12:30 PM Medical Record Number: [id number] Patient Account Number: [id number] Date of Birth/Sex: 1965-09-26 (51 y.o. Male) Treating RN: Roger Shelter Primary Care Yichen Gilardi: Deborra Medina Other Clinician: Referring Jaquetta Currier: Referral, Self Treating Zaela Graley/Extender: Frann Rider in Treatment: 0 Foot Assessment Items Site Locations + = Sensation present, - = Sensation absent, C = Callus, U = Ulcer R = Redness, W = Warmth, M = Maceration, PU = Pre-ulcerative lesion F = Fissure, S = Swelling, D = Dryness Assessment Right: Left: Other Deformity: No No Prior Foot Ulcer: Yes No Prior Amputation: Yes No Charcot Joint: No No Ambulatory Status: Ambulatory Without Help Gait: Steady Notes transmet on right Electronic  Signature(s) Signed: 07/06/2017 4:46:29 PM By: Roger Shelter Entered By: Roger Shelter on 07/06/2017 13:14:10 Skow, Rayburn Felt ([id number]) -------------------------------------------------------------------------------- Nutrition Risk Assessment Details Patient Name: Fabiano, Ki C. Date of Service: 07/06/2017 12:30 PM Medical Record Number: [id number] Patient Account Number: [id number] Date of Birth/Sex: December 01, 1965 (51 y.o. Male) Treating RN: Roger Shelter Primary Care Kanda Deluna: Deborra Medina Other Clinician: Referring Jadan Rouillard: Referral, Self Treating Derotha Fishbaugh/Extender: Frann Rider in Treatment: 0 Height (in): 70 Weight (lbs): 235 Body Mass Index (BMI): 33.7 Nutrition Risk Assessment Items NUTRITION RISK SCREEN: I have an illness or condition that made me change the kind and/or amount of 0 No food I eat I eat fewer than two meals per day 0 No I eat few fruits and vegetables, or milk products 0 No I have three or more drinks of beer, liquor or wine almost every day 0 No I have tooth or mouth problems that make it hard for me to eat 0 No I don't always have enough money to buy the food I need 0 No I eat alone most of the time 0 No I take three or more different prescribed or over-the-counter drugs a day 0 No Without wanting to, I have lost or gained 10 pounds in the last six months 0 No I am not always physically able to shop, cook and/or feed myself 0 No Nutrition Protocols Good Risk Protocol 0 No interventions needed Moderate Risk Protocol Electronic Signature(s) Signed: 07/06/2017 4:46:29 PM By: Roger Shelter Entered By: Roger Shelter on 07/06/2017 13:13:07

## 2017-07-09 DIAGNOSIS — T8189XA Other complications of procedures, not elsewhere classified, initial encounter: Secondary | ICD-10-CM | POA: Diagnosis not present

## 2017-07-10 DIAGNOSIS — B9561 Methicillin susceptible Staphylococcus aureus infection as the cause of diseases classified elsewhere: Secondary | ICD-10-CM | POA: Diagnosis not present

## 2017-07-10 DIAGNOSIS — E1142 Type 2 diabetes mellitus with diabetic polyneuropathy: Secondary | ICD-10-CM | POA: Diagnosis not present

## 2017-07-10 DIAGNOSIS — T8743 Infection of amputation stump, right lower extremity: Secondary | ICD-10-CM | POA: Diagnosis not present

## 2017-07-10 DIAGNOSIS — R7881 Bacteremia: Secondary | ICD-10-CM | POA: Diagnosis not present

## 2017-07-10 DIAGNOSIS — T8189XA Other complications of procedures, not elsewhere classified, initial encounter: Secondary | ICD-10-CM | POA: Diagnosis not present

## 2017-07-10 DIAGNOSIS — L03115 Cellulitis of right lower limb: Secondary | ICD-10-CM | POA: Diagnosis not present

## 2017-07-10 DIAGNOSIS — B0052 Herpesviral keratitis: Secondary | ICD-10-CM | POA: Diagnosis not present

## 2017-07-10 DIAGNOSIS — G4733 Obstructive sleep apnea (adult) (pediatric): Secondary | ICD-10-CM | POA: Diagnosis not present

## 2017-07-10 DIAGNOSIS — M86171 Other acute osteomyelitis, right ankle and foot: Secondary | ICD-10-CM | POA: Diagnosis not present

## 2017-07-10 DIAGNOSIS — Z452 Encounter for adjustment and management of vascular access device: Secondary | ICD-10-CM | POA: Diagnosis not present

## 2017-07-10 DIAGNOSIS — E1169 Type 2 diabetes mellitus with other specified complication: Secondary | ICD-10-CM | POA: Diagnosis not present

## 2017-07-10 DIAGNOSIS — Z7982 Long term (current) use of aspirin: Secondary | ICD-10-CM | POA: Diagnosis not present

## 2017-07-10 DIAGNOSIS — Z792 Long term (current) use of antibiotics: Secondary | ICD-10-CM | POA: Diagnosis not present

## 2017-07-10 NOTE — Progress Notes (Signed)
ANANTH, FIALLOS ([id number]) Visit Report for 07/06/2017 Allergy List Details Patient Name: [name], Darrell C. Date of Service: 07/06/2017 12:30 PM Medical Record Number: [id number] Patient Account Number: [id number] Date of Birth/Sex: 1965/10/23 (51 y.o. Male) Treating RN: [name] Primary Care [name]: [name] Other Clinician: Referring [name]: Referral, Self Treating [name] in Treatment: 0 Allergies Active Allergies No Known Drug Allergies Allergy Notes Electronic Signature(s) Signed: 07/06/2017 4:46:29 PM By: [name] Entered By: [name] on 07/06/2017 13:12:23 [name], [name] ([id number]) -------------------------------------------------------------------------------- Arrival Information Details Patient Name: [name], Darrell C. Date of Service: 07/06/2017 12:30 PM Medical Record Number: [id number] Patient Account Number: [id number] Date of Birth/Sex: Jan 16, 1966 (51 y.o. Male) Treating RN: [name] Primary Care Crespin Forstrom: [name] Other Clinician: Referring Ciela Mahajan: Referral, Self Treating [name]/Extender: [name] in Treatment: 0 Visit Information Patient Arrived: Ambulatory Arrival Time: 12:43 Accompanied By: [name] Transfer Assistance: None Patient Identification Verified: Yes Secondary Verification Process Completed: Yes Patient Requires Transmission-Based No Precautions: Patient Has Alerts: No Electronic Signature(s) Signed: 07/06/2017 4:46:29 PM By: [name] Entered By: [name] on 07/06/2017 12:44:34 [name], [name] ([id number]) -------------------------------------------------------------------------------- Clinic Level of Care Assessment Details Patient Name: [name], Darrell C. Date of Service: 07/06/2017 12:30 PM Medical Record Number: [id number] Patient Account Number: [id number] Date of Birth/Sex: [birth year] (51 y.o. Male) Treating RN: [name] Primary  Care Vola Beneke: [name] Other Clinician: Referring Avondre Richens: Referral, Self Treating Brodrick Curran/Extender: [name] in Treatment: 0 Clinic Level of Care Assessment Items TOOL 4 Quantity Score []  - Use when only an EandM is performed on FOLLOW-UP visit 0 ASSESSMENTS - Nursing Assessment / Reassessment X - Reassessment of Co-morbidities (includes updates in patient status) 1 10 X- 1 5 Reassessment of Adherence to Treatment Plan ASSESSMENTS - Wound and Skin Assessment / Reassessment X - Simple Wound Assessment / Reassessment - one wound 1 5 []  - 0 Complex Wound Assessment / Reassessment - multiple wounds []  - 0 Dermatologic / Skin Assessment (not related to wound area) ASSESSMENTS - Focused Assessment []  - Circumferential Edema Measurements - multi extremities 0 []  - 0 Nutritional Assessment / Counseling / Intervention []  - 0 Lower Extremity Assessment (monofilament, tuning fork, pulses) []  - 0 Peripheral Arterial Disease Assessment (using hand held doppler) ASSESSMENTS - Ostomy and/or Continence Assessment and Care []  - Incontinence Assessment and Management 0 []  - 0 Ostomy Care Assessment and Management (repouching, etc.) PROCESS - Coordination of Care X - Simple Patient / Family Education for ongoing care 1 15 []  - 0 Complex (extensive) Patient / Family Education for ongoing care X- 1 10 Staff obtains Programmer, systems, Records, Test Results / Process Orders []  - 0 Staff telephones HHA, Nursing Homes / Clarify orders / etc []  - 0 Routine Transfer to another Facility (non-emergent condition) []  - 0 Routine Hospital Admission (non-emergent condition) []  - 0 New Admissions / Biomedical engineer / Ordering NPWT, Apligraf, etc. []  - 0 Emergency Hospital Admission (emergent condition) X- 1 10 Simple Discharge Coordination [name], [name] ([id number]) []  - 0 Complex (extensive) Discharge Coordination PROCESS - Special Needs []  - Pediatric / Minor Patient  Management 0 []  - 0 Isolation Patient Management []  - 0 Hearing / Language / Visual special needs []  - 0 Assessment of Community assistance (transportation, D/C planning, etc.) []  - 0 Additional assistance / Altered mentation []  - 0 Support Surface(s) Assessment (bed, cushion, seat, etc.) INTERVENTIONS - Wound Cleansing / Measurement X - Simple Wound Cleansing - one wound 1 5 []  - 0  Complex Wound Cleansing - multiple wounds X- 1 5 Wound Imaging (photographs - any number of wounds) []  - 0 Wound Tracing (instead of photographs) X- 1 5 Simple Wound Measurement - one wound []  - 0 Complex Wound Measurement - multiple wounds INTERVENTIONS - Wound Dressings []  - Small Wound Dressing one or multiple wounds 0 X- 1 15 Medium Wound Dressing one or multiple wounds []  - 0 Large Wound Dressing one or multiple wounds []  - 0 Application of Medications - topical []  - 0 Application of Medications - injection INTERVENTIONS - Miscellaneous []  - External ear exam 0 []  - 0 Specimen Collection (cultures, biopsies, blood, body fluids, etc.) []  - 0 Specimen(s) / Culture(s) sent or taken to Lab for analysis []  - 0 Patient Transfer (multiple staff / Civil Service fast streamer / Similar devices) []  - 0 Simple Staple / Suture removal (25 or less) []  - 0 Complex Staple / Suture removal (26 or more) []  - 0 Hypo / Hyperglycemic Management (close monitor of Blood Glucose) X- 1 15 Ankle / Brachial Index (ABI) - do not check if billed separately X- 1 5 Vital Signs [name], Darrell C. ([id number]) Has the patient been seen at the hospital within the last three years: Yes Total Score: 105 Level Of Care: New/Established - Level 3 Electronic Signature(s) Signed: 07/06/2017 5:22:35 PM By: Gretta Cool, BSN, RN, CWS, Kim RN, BSN Entered By: Gretta Cool, BSN, RN, CWS, Kim on 07/06/2017 16:53:36 [name], [name] ([id number]) -------------------------------------------------------------------------------- Encounter Discharge  Information Details Patient Name: [name], Darrell C. Date of Service: 07/06/2017 12:30 PM Medical Record Number: [id number] Patient Account Number: [id number] Date of Birth/Sex: 03/23/1966 (50 y.o. Male) Treating RN: [name] Primary Care Thaddus Mcdowell: [name] Other Clinician: Referring Glenda Spelman: Referral, Self Treating Debarah Mccumbers/Extender: [name] in Treatment: 0 Encounter Discharge Information Items Schedule Follow-up Appointment: No Medication Reconciliation completed and No provided to Patient/Care Rodneshia Greenhouse: Provided on Clinical Summary of Care: 07/06/2017 Form Type Recipient Paper Patient SD Electronic Signature(s) Signed: 07/10/2017 4:27:54 PM By: Ruthine Dose Entered By: Ruthine Dose on 07/06/2017 13:54:11 Gillham, [name] ([id number]) -------------------------------------------------------------------------------- Lower Extremity Assessment Details Patient Name: [name], Darrell C. Date of Service: 07/06/2017 12:30 PM Medical Record Number: [id number] Patient Account Number: [id number] Date of Birth/Sex: [birth year] (51 y.o. Male) Treating RN: [name] Primary Care Bentley Fissel: [name] Other Clinician: Referring Oluwadara Gorman: Referral, Self Treating Jaevian Shean/Extender: [name] in Treatment: 0 Edema Assessment Assessed: [Left: No] [Right: No] Edema: [Left: N] [Right: o] Vascular Assessment Claudication: Claudication Assessment [Right:None] Pulses: Dorsalis Pedis Palpable: [Right:Yes] Doppler Audible: [Right:Yes] Posterior Tibial Extremity colors, hair growth, and conditions: Extremity Color: [Right:Normal] Hair Growth on Extremity: [Right:Yes] Temperature of Extremity: [Right:Warm] Blood Pressure: Brachial: [Right:118] Dorsalis Pedis: [Left:Dorsalis Pedis: 244] Ankle: Posterior Tibial: [Left:Posterior Tibial: 160] [Right:1.39] Electronic Signature(s) Signed: 07/06/2017 4:46:29 PM By: [name] Entered By: [name] on  07/06/2017 13:12:17 Kohli, [name] ([id number]) -------------------------------------------------------------------------------- Multi Wound Chart Details Patient Name: [name], Darrell C. Date of Service: 07/06/2017 12:30 PM Medical Record Number: [id number] Patient Account Number: [id number] Date of Birth/Sex: 09/03/1965 (51 y.o. Male) Treating RN: [name] Primary Care Anuel Sitter: [name] Other Clinician: Referring Jaunice Mirza: Referral, Self Treating Gracee Ratterree/Extender: [name] in Treatment: 0 Vital Signs Height(in): 70 Pulse(bpm): 87 Weight(lbs): 235 Blood Pressure(mmHg): 148/75 Body Mass Index(BMI): 34 Temperature(F): 97.87 Respiratory Rate 18 (breaths/min): Photos: [N/A:N/A] Wound Location: Right Foot - Plantar N/A N/A Wounding Event: Surgical Injury N/A N/A Primary Etiology: Dehisced Wound N/A N/A Comorbid History: Sleep Apnea, Osteomyelitis N/A N/A Date Acquired: 04/05/2017 N/A N/A  Weeks of Treatment: 0 N/A N/A Wound Status: Open N/A N/A Measurements L x W x D 3.2x2.1x0.2 N/A N/A (cm) Area (cm) : 5.278 N/A N/A Volume (cm) : 1.056 N/A N/A Classification: Full Thickness Without N/A N/A Exposed Support Structures Exudate Amount: Medium N/A N/A Exudate Type: Serosanguineous N/A N/A Exudate Color: red, brown N/A N/A Wound Margin: Flat and Intact N/A N/A Granulation Amount: Large (67-100%) N/A N/A Granulation Quality: Red N/A N/A Exposed Structures: Fat Layer (Subcutaneous N/A N/A Tissue) Exposed: Yes Fascia: No Tendon: No Muscle: No Joint: No Bone: No Epithelialization: None N/A N/A Periwound Skin Texture: Excoriation: No N/A N/A Induration: No Callus: No Crepitus: No Sida, Zalan C. ([id number]) Rash: No Scarring: No Periwound Skin Moisture: Maceration: No N/A N/A Dry/Scaly: No Periwound Skin Color: Atrophie Blanche: No N/A N/A Cyanosis: No Ecchymosis: No Erythema: No Hemosiderin Staining: No Mottled: No Pallor: No Rubor:  No Tenderness on Palpation: No N/A N/A Wound Preparation: Ulcer Cleansing: N/A N/A Rinsed/Irrigated with Saline Treatment Notes Electronic Signature(s) Signed: 07/06/2017 2:46:51 PM By: Christin Fudge MD, FACS Entered By: Christin Fudge on 07/06/2017 14:46:51 Bickle, [name] ([id number]) -------------------------------------------------------------------------------- [place] Details Patient Name: [name], Darrell C. Date of Service: 07/06/2017 12:30 PM Medical Record Number: [id number] Patient Account Number: [id number] Date of Birth/Sex: September 03, 1965 (51 y.o. Male) Treating RN: [name] Primary Care Mandy Fitzwater: [name] Other Clinician: Referring Janssen Zee: Referral, Self Treating Nan Maya/Extender: [name] in Treatment: 0 Active Inactive Electronic Signature(s) Signed: 07/06/2017 4:46:29 PM By: [name] Entered By: [name] on 07/06/2017 13:54:08 Brueckner, [name] ([id number]) -------------------------------------------------------------------------------- Pain Assessment Details Patient Name: [name], Darrell C. Date of Service: 07/06/2017 12:30 PM Medical Record Number: [id number] Patient Account Number: [id number] Date of Birth/Sex: 1966-08-02 (51 y.o. Male) Treating RN: [name] Primary Care Yaslene Lindamood: [name] Other Clinician: Referring Oluwatobi Ruppe: Referral, Self Treating Kalle Bernath/Extender: [name] in Treatment: 0 Active Problems Location of Pain Severity and Description of Pain Patient Has Paino No Site Locations Pain Management and Medication Current Pain Management: Electronic Signature(s) Signed: 07/06/2017 4:46:29 PM By: [name] Entered By: [name] on 07/06/2017 12:44:46 [name], [name] ([id number]) -------------------------------------------------------------------------------- Wound Assessment Details Patient Name: [name], Darrell C. Date of Service: 07/06/2017 12:30 PM Medical  Record Number: [id number] Patient Account Number: [id number] Date of Birth/Sex: Aug 15, 1966 (51 y.o. Male) Treating RN: [name] Primary Care Delanda Bulluck: [name] Other Clinician: Referring Jeremaih Klima: Referral, Self Treating Charnise Lovan/Extender: [name] in Treatment: 0 Wound Status Wound Number: 1 Primary Etiology: Dehisced Wound Wound Location: Right Foot - Plantar Wound Status: Open Wounding Event: Surgical Injury Comorbid History: Sleep Apnea, Osteomyelitis Date Acquired: 04/05/2017 Weeks Of Treatment: 0 Clustered Wound: No Photos Wound Measurements Length: (cm) 3.2 Width: (cm) 2.1 Depth: (cm) 0.2 Area: (cm) 5.278 Volume: (cm) 1.056 % Reduction in Area: % Reduction in Volume: Epithelialization: None Tunneling: No Undermining: No Wound Description Full Thickness Without Exposed Support Foul Odo Classification: Structures Slough/F Wound Margin: Flat and Intact Exudate Medium Amount: Exudate Type: Serosanguineous Exudate Color: red, brown r After Cleansing: No ibrino No Wound Bed Granulation Amount: Large (67-100%) Exposed Structure Granulation Quality: Red Fascia Exposed: No Fat Layer (Subcutaneous Tissue) Exposed: Yes Tendon Exposed: No Muscle Exposed: No Joint Exposed: No Bone Exposed: No Periwound Skin Texture Texture Color No Abnormalities Noted: No No Abnormalities Noted: No [name], Darrell C. ([id number]) Callus: No Atrophie Blanche: No Crepitus: No Cyanosis: No Excoriation: No Ecchymosis: No Induration: No Erythema: No Rash: No Hemosiderin Staining: No Scarring: No Mottled: No Pallor: No Moisture Rubor:  No No Abnormalities Noted: No Dry / Scaly: No Maceration: No Wound Preparation Ulcer Cleansing: Rinsed/Irrigated with Saline Electronic Signature(s) Signed: 07/06/2017 4:46:29 PM By: [name] Entered By: [name] on 07/06/2017 13:07:30 [name], [name]  ([id number]) -------------------------------------------------------------------------------- Vitals Details Patient Name: Moura, Nicolai C. Date of Service: 07/06/2017 12:30 PM Medical Record Number: [id number] Patient Account Number: [id number] Date of Birth/Sex: [birth year] (51 y.o. Male) Treating RN: [name] Primary Care Kharee Lesesne: [name] Other Clinician: Referring Elpidio Thielen: Referral, Self Treating Mendell Bontempo/Extender: [name] in Treatment: 0 Vital Signs Time Taken: 12:45 Temperature (F): 97.87 Height (in): 70 Pulse (bpm): 87 Source: Stated Respiratory Rate (breaths/min): 18 Weight (lbs): 235 Blood Pressure (mmHg): 148/75 Source: Stated Reference Range: 80 - 120 mg / dl Body Mass Index (BMI): 33.7 Electronic Signature(s) Signed: 07/06/2017 4:46:29 PM By: [name] Entered By: [name] on 07/06/2017 24:46:28

## 2017-07-12 DIAGNOSIS — E1142 Type 2 diabetes mellitus with diabetic polyneuropathy: Secondary | ICD-10-CM | POA: Diagnosis not present

## 2017-07-12 DIAGNOSIS — E1169 Type 2 diabetes mellitus with other specified complication: Secondary | ICD-10-CM | POA: Diagnosis not present

## 2017-07-12 DIAGNOSIS — G4733 Obstructive sleep apnea (adult) (pediatric): Secondary | ICD-10-CM | POA: Diagnosis not present

## 2017-07-12 DIAGNOSIS — Z7982 Long term (current) use of aspirin: Secondary | ICD-10-CM | POA: Diagnosis not present

## 2017-07-12 DIAGNOSIS — T8743 Infection of amputation stump, right lower extremity: Secondary | ICD-10-CM | POA: Diagnosis not present

## 2017-07-12 DIAGNOSIS — Z452 Encounter for adjustment and management of vascular access device: Secondary | ICD-10-CM | POA: Diagnosis not present

## 2017-07-12 DIAGNOSIS — L03115 Cellulitis of right lower limb: Secondary | ICD-10-CM | POA: Diagnosis not present

## 2017-07-12 DIAGNOSIS — B0052 Herpesviral keratitis: Secondary | ICD-10-CM | POA: Diagnosis not present

## 2017-07-12 DIAGNOSIS — B9561 Methicillin susceptible Staphylococcus aureus infection as the cause of diseases classified elsewhere: Secondary | ICD-10-CM | POA: Diagnosis not present

## 2017-07-12 DIAGNOSIS — R7881 Bacteremia: Secondary | ICD-10-CM | POA: Diagnosis not present

## 2017-07-12 DIAGNOSIS — Z792 Long term (current) use of antibiotics: Secondary | ICD-10-CM | POA: Diagnosis not present

## 2017-07-12 DIAGNOSIS — M86171 Other acute osteomyelitis, right ankle and foot: Secondary | ICD-10-CM | POA: Diagnosis not present

## 2017-07-13 ENCOUNTER — Telehealth: Payer: Self-pay | Admitting: Internal Medicine

## 2017-07-13 NOTE — Telephone Encounter (Signed)
Copied from [place] [phone]. Topic: Quick Communication - See Telephone Encounter >> Jul 13, 2017  8:17 AM Burnis Medin, NT wrote: CRM for notification. See Telephone encounter for: Elane Fritz from [facility] is calling to get verbal orders for womb care. It's ok to leave message on answering machine.  07/13/17.

## 2017-07-13 NOTE — Telephone Encounter (Signed)
Ok to give verbal to [facility] for wound care?

## 2017-07-13 NOTE — Telephone Encounter (Signed)
yes

## 2017-07-13 NOTE — Telephone Encounter (Signed)
Verbals given to [facility].

## 2017-07-14 DIAGNOSIS — T8743 Infection of amputation stump, right lower extremity: Secondary | ICD-10-CM | POA: Diagnosis not present

## 2017-07-14 DIAGNOSIS — Z7982 Long term (current) use of aspirin: Secondary | ICD-10-CM | POA: Diagnosis not present

## 2017-07-14 DIAGNOSIS — E1142 Type 2 diabetes mellitus with diabetic polyneuropathy: Secondary | ICD-10-CM | POA: Diagnosis not present

## 2017-07-14 DIAGNOSIS — Z792 Long term (current) use of antibiotics: Secondary | ICD-10-CM | POA: Diagnosis not present

## 2017-07-14 DIAGNOSIS — R7881 Bacteremia: Secondary | ICD-10-CM | POA: Diagnosis not present

## 2017-07-14 DIAGNOSIS — M86171 Other acute osteomyelitis, right ankle and foot: Secondary | ICD-10-CM | POA: Diagnosis not present

## 2017-07-14 DIAGNOSIS — L03115 Cellulitis of right lower limb: Secondary | ICD-10-CM | POA: Diagnosis not present

## 2017-07-14 DIAGNOSIS — G4733 Obstructive sleep apnea (adult) (pediatric): Secondary | ICD-10-CM | POA: Diagnosis not present

## 2017-07-14 DIAGNOSIS — Z452 Encounter for adjustment and management of vascular access device: Secondary | ICD-10-CM | POA: Diagnosis not present

## 2017-07-14 DIAGNOSIS — T8189XA Other complications of procedures, not elsewhere classified, initial encounter: Secondary | ICD-10-CM | POA: Diagnosis not present

## 2017-07-14 DIAGNOSIS — E1169 Type 2 diabetes mellitus with other specified complication: Secondary | ICD-10-CM | POA: Diagnosis not present

## 2017-07-14 DIAGNOSIS — B0052 Herpesviral keratitis: Secondary | ICD-10-CM | POA: Diagnosis not present

## 2017-07-14 DIAGNOSIS — B9561 Methicillin susceptible Staphylococcus aureus infection as the cause of diseases classified elsewhere: Secondary | ICD-10-CM | POA: Diagnosis not present

## 2017-07-17 DIAGNOSIS — S91309A Unspecified open wound, unspecified foot, initial encounter: Secondary | ICD-10-CM | POA: Diagnosis not present

## 2017-07-28 DIAGNOSIS — L97412 Non-pressure chronic ulcer of right heel and midfoot with fat layer exposed: Secondary | ICD-10-CM | POA: Diagnosis not present

## 2017-08-18 DIAGNOSIS — L97412 Non-pressure chronic ulcer of right heel and midfoot with fat layer exposed: Secondary | ICD-10-CM | POA: Diagnosis not present

## 2017-08-23 DIAGNOSIS — S91309A Unspecified open wound, unspecified foot, initial encounter: Secondary | ICD-10-CM | POA: Diagnosis not present

## 2017-08-30 ENCOUNTER — Encounter: Payer: Self-pay | Admitting: Internal Medicine

## 2017-08-30 NOTE — Telephone Encounter (Signed)
Refilled: 10/05/2016 Last OV: 10/05/2016 Next OV: not scheduled

## 2017-08-31 ENCOUNTER — Telehealth: Payer: Self-pay | Admitting: Internal Medicine

## 2017-08-31 MED ORDER — PREGABALIN 75 MG PO CAPS: 75.0000 mg | ORAL_CAPSULE | Freq: Every day | ORAL | 1 refills | Status: DC

## 2017-08-31 NOTE — Telephone Encounter (Signed)
Printed, signed and faxed.  

## 2017-08-31 NOTE — Telephone Encounter (Signed)
Scheduled for 10/09/17

## 2017-08-31 NOTE — Telephone Encounter (Signed)
Needs office visit in February  PLSAE SCHEDULE.  LYRICA REFILLED

## 2017-09-08 DIAGNOSIS — L97412 Non-pressure chronic ulcer of right heel and midfoot with fat layer exposed: Secondary | ICD-10-CM | POA: Diagnosis not present

## 2017-09-12 DIAGNOSIS — S91309A Unspecified open wound, unspecified foot, initial encounter: Secondary | ICD-10-CM | POA: Diagnosis not present

## 2017-09-26 ENCOUNTER — Encounter: Payer: Self-pay | Admitting: Internal Medicine

## 2017-09-29 DIAGNOSIS — Z6835 Body mass index (BMI) 35.0-35.9, adult: Secondary | ICD-10-CM | POA: Diagnosis not present

## 2017-09-29 DIAGNOSIS — Z9119 Patient's noncompliance with other medical treatment and regimen: Secondary | ICD-10-CM | POA: Diagnosis not present

## 2017-09-29 DIAGNOSIS — L03119 Cellulitis of unspecified part of limb: Secondary | ICD-10-CM | POA: Diagnosis not present

## 2017-09-29 DIAGNOSIS — L97412 Non-pressure chronic ulcer of right heel and midfoot with fat layer exposed: Secondary | ICD-10-CM | POA: Diagnosis not present

## 2017-10-08 ENCOUNTER — Encounter: Payer: Self-pay | Admitting: Internal Medicine

## 2017-10-09 ENCOUNTER — Ambulatory Visit (INDEPENDENT_AMBULATORY_CARE_PROVIDER_SITE_OTHER): Payer: BLUE CROSS/BLUE SHIELD | Admitting: Internal Medicine

## 2017-10-09 ENCOUNTER — Encounter: Payer: Self-pay | Admitting: Internal Medicine

## 2017-10-09 VITALS — BP 118/70 | HR 88 | Temp 98.0°F | Resp 15 | Ht 70.0 in | Wt 248.6 lb

## 2017-10-09 DIAGNOSIS — G4733 Obstructive sleep apnea (adult) (pediatric): Secondary | ICD-10-CM | POA: Diagnosis not present

## 2017-10-09 DIAGNOSIS — E1142 Type 2 diabetes mellitus with diabetic polyneuropathy: Secondary | ICD-10-CM

## 2017-10-09 DIAGNOSIS — F329 Major depressive disorder, single episode, unspecified: Secondary | ICD-10-CM

## 2017-10-09 DIAGNOSIS — Z23 Encounter for immunization: Secondary | ICD-10-CM | POA: Diagnosis not present

## 2017-10-09 LAB — POCT GLYCOSYLATED HEMOGLOBIN (HGB A1C): Hemoglobin A1C: 6.6

## 2017-10-09 MED ORDER — BUPROPION HCL ER (XL) 150 MG PO TB24: 150.0000 mg | ORAL_TABLET | Freq: Every day | ORAL | 2 refills | Status: DC

## 2017-10-09 NOTE — Progress Notes (Signed)
Subjective:  Patient ID: [name], male    DOB: 01-04-1966  Age: 52 y.o. 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 Diagnoses of Need for 23-polyvalent pneumococcal polysaccharide vaccine, Reactive depression, and OSA (obstructive sleep apnea) were also pertinent to this visit.  HPI [name] presents for follow up on chronic conditions, INCLUDING NEW ONSET TYPE 2 DM  Diagnosed one year ago.  He was advised to follow up in 3  Months. Hels guilty if s states that he had been reassured by the nutritionist at [facility] and b his other dcotros that this was not necessary.   LAST SEEN ONE YEAR AGO   HAS GAINED 8 LBS  SINCE THEN   HAS BEEN CHECKING SUGARS INFREQUENTLY and did not bring his log of sugars.   His llifestyle continues to be sendentary due to ongoing right foot osteomyelitis involving the 1st MC.  He has severe neuroapthy resulting in recurrent pressure ulcers and recurrent osteomyelitis.   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  He is receiving home health wound care.  He is wearing an ASO on right foot , which was not examined today   Not sleeping in a position that aggravates his OSA.  Lies On his side in trendelenberg on the couch.  Not wearing CPAP.  Has restrictive lung disease ,  PFT's ordered by [facility] but not done     He admits to being depressed due to physical disability . Has not worked in years.  Despite being disabled for several years  He has not availed himself to online courses to pursue an alternative/ graduate degree because he feels guilty if he spends money   Not motivated   Tinnitus both ears  Needs ENT referral  [place] in [place] Last eye exam  A year ago.     Advised to check with insurance about cologuard.   Lab Results  Component Value  Date   HGBA1C 6.6 10/09/2017     Outpatient Medications Prior to Visit  Medication Sig Dispense Refill  . acyclovir (ZOVIRAX) 400 MG tablet Take 1 tablet by mouth as needed.  0  . amoxicillin-clavulanate (AUGMENTIN) 875-125 MG tablet Take by mouth.    . Ascorbic Acid (VITAMIN C) 100 MG tablet Take 500 mg by mouth daily.    . Multiple Vitamins-Minerals (MULTIVITAMIN WITH MINERALS) tablet Take 1 tablet by mouth daily.    . pregabalin (LYRICA) 75 MG capsule Take 1 capsule (75 mg total) by mouth daily. At bedtime for nerve pain 30 capsule 1  . oxycodone (OXY-IR) 5 MG capsule Take 5 mg by mouth 4 (four) times daily as needed.    . traMADol (ULTRAM) 50 MG tablet Take 1 tablet (50 mg total) by mouth every 8 (eight) hours as needed. (Patient not taking: Reported on 10/09/2017) 90 tablet 2  . ergocalciferol (DRISDOL) 50000 units capsule Take 1 capsule (50,000 Units total) by mouth once a week. (Patient not taking: Reported on 10/09/2017) 4 capsule 2  . meclizine (ANTIVERT) 25 MG tablet Take 1 tablet (25 mg total) by mouth 3 (three) times daily as needed for dizziness. (Patient not taking: Reported on 10/05/2016) 30 tablet 0   No facility-administered medications prior to visit.     Review of Systems;  Patient denies headache, fevers, malaise, unintentional weight loss, skin rash, eye pain, sinus congestion and sinus pain, sore throat, dysphagia,  hemoptysis , cough, dyspnea, wheezing, chest pain, palpitations, orthopnea, edema, abdominal pain, nausea, melena, diarrhea, constipation, flank pain, dysuria, hematuria, urinary  Frequency, nocturia, numbness, tingling, seizures,  Focal weakness, Loss of consciousness,  Tremor, insomnia, depression, anxiety, and suicidal ideation.      Objective:  BP 118/70 (BP Location: Left Arm, Patient Position: Sitting, Cuff Size: Normal)   Pulse 88   Temp 98 F (36.7 C) (Oral)   Resp 15   Ht 5\' 10"  (1.778 m)   Wt 248 lb 9.6 oz (112.8 kg)   SpO2 96%   BMI 35.67  kg/m   BP Readings from Last 3 Encounters:  10/09/17 118/70  10/05/16 122/80  05/16/16 136/70    Wt Readings from Last 3 Encounters:  10/09/17 248 lb 9.6 oz (112.8 kg)  10/05/16 240 lb (108.9 kg)  05/16/16 236 lb 8 oz (107.3 kg)    General appearance: alert, cooperative and appears stated age Ears: normal TM's and external ear canals both ears Throat: lips, mucosa, and tongue normal; teeth and gums normal Neck: no adenopathy, no carotid bruit, supple, symmetrical, trachea midline and thyroid not enlarged, symmetric, no tenderness/mass/nodules Back: symmetric, no curvature. ROM normal. No CVA tenderness. Lungs: clear to auscultation bilaterally Heart: regular rate and rhythm, S1, S2 normal, no murmur, click, rub or gallop Abdomen: soft, non-tender; bowel sounds normal; no masses,  no organomegaly Pulses: 2+ and symmetric Skin: Skin color, texture, turgor normal. No rashes or lesions Lymph nodes: Cervical, supraclavicular, and axillary nodes normal.  Lab Results  Component Value Date   HGBA1C 6.6 10/09/2017   HGBA1C 6.5 10/05/2016   HGBA1C 6.0 05/06/2016    Lab Results  Component Value Date   CREATININE 0.8 04/08/2017   CREATININE 0.84 10/05/2016   CREATININE 0.76 05/10/2016    Lab Results  Component Value Date   WBC 7.6 04/08/2017   HGB 13.4 (A) 04/08/2017   HCT 40 (A) 04/08/2017   PLT 532 (A) 04/08/2017   GLUCOSE 109 (H) 10/05/2016   CHOL 168 10/05/2016   TRIG 127.0 10/05/2016   HDL 37.80 (L) 10/05/2016   LDLDIRECT 111.0 10/05/2016   LDLCALC 105 (H) 10/05/2016   ALT 26 04/08/2017   AST 27 04/08/2017   NA 138 04/08/2017   K 4.2 04/08/2017   CL 105 10/05/2016   CREATININE 0.8 04/08/2017   BUN 13 04/08/2017   CO2 23 10/05/2016   TSH 0.78 12/04/2015   PSA 0.20 10/05/2016   INR 1.3 (A) 04/08/2017   HGBA1C 6.6 10/09/2017   MICROALBUR 2.4 (H) 10/05/2016    No results found.  Assessment & Plan:   Problem List Items Addressed This Visit    OSA  (obstructive sleep apnea)    Remains Untreated due  financial issues . cpap titration ordered but never done due to surgery in November 2017 on right foot. He has now deferred the CPAP titration study once again due to out of pocket cost.   Discussed the long term history of OSA , the risks of long term damage to heart and the signs and symptoms attributable to OSA.  Advised patient to consider  significant weight loss and/or use of CPAP.       Depression    Guilt, lack of motivation anhedonia, weight gain.  Defers psychology referral.  Trial of wellbutrin .      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 Consolo's meclizine and ergocalciferol. I am also having him start on buPROPion. Additionally, I am having him maintain his multivitamin with minerals, traMADol, vitamin C, oxycodone, acyclovir, pregabalin, and amoxicillin-clavulanate.  Meds ordered this encounter  Medications  . buPROPion (WELLBUTRIN XL) 150 MG 24 hr tablet    Sig: Take 1 tablet (150 mg total) by mouth daily.    Dispense:  30 tablet    Refill:  2    Medications Discontinued During This Encounter  Medication Reason  . ergocalciferol (DRISDOL) 50000 units capsule Completed Course  . meclizine (ANTIVERT) 25 MG tablet Patient has not taken in last  30 days    Follow-up: Return in about 3 months (around 01/06/2018).   Crecencio Mc, MD

## 2017-10-09 NOTE — Patient Instructions (Addendum)
I recommend a trial of wellbutrin to help your mood   You have type 2 Diabetes ,  Which is likely due to your sedentary lifestyle .  This is my  example of a  "Low Glycemic Index  Diet." :  It will allow you to lose 4 to 8  lbs  per month if you follow it carefully.  Your goal with exercise is a minimum of 30 minutes of aerobic exercise 5 days per week (Walking does not count once it becomes easy!)    All of the foods can be found at grocery stores and in bulk at [employer].  The Atkins protein bars and shakes are available in more varieties at Target, [employer] and [place].     7 AM Breakfast:  Choose from the following:  Low carbohydrate Protein  Shakes (I recommend the EAS AdvantEdge "Carb Control" shakes  Or the low carb shakes by Atkins.    2.5 carbs)   Arnold's "Sandwhich Thin"toasted  w/ peanut butter (no jelly: about 20 net carbs  "Bagel Thin" with cream cheese and salmon: about 20 carbs   a scrambled egg/bacon/cheese burrito made with Mission's "carb balance" whole wheat tortilla  (about 10 net carbs )  Danton Clap makes a frittata (basically a quiche without the pastry crust) that is  microwaveable in 2 minutes and a convenient way to get your eggs  If you make your own shakes, avoid bananas and pineapple,  And use low carb greek yogurt or almond milk    Avoid cereal and bananas, oatmeal and cream of wheat and grits. They are loaded with carbohydrates!   10 AM: high protein snack:  Protein bar by Atkins (the snack size, under 200 cal, usually < 6 net carbs).    A stick of cheese:  Around 1 carb,  100 cal     Dannon Light n Fit [place] Yogurt  (80 cal, 8 carbs)  Other so called "protein bars" and Greek yogurts tend to be loaded with carbohydrates.  Remember, in food advertising, the word "energy" is synonymous for " carbohydrate."  Lunch:   A Sandwich using the bread choices listed, Can use any  Eggs,  lunchmeat, grilled meat or canned tuna), avocado, regular mayo/mustard  and  cheese.  A Salad using blue cheese, ranch,  Goddess or vinagrette,  Avoid taco shells, croutons or "confetti" and no "candied nuts" but regular nuts OK.   No pretzels or chips.  Pickles and miniature sweet peppers are a good low carb alternative that provide a "crunch"  The bread is the only source of carbohydrate in a sandwich and  can be decreased by trying some of these alternatives to traditional loaf bread  Joseph's makes a pita bread and a flat bread that are 50 cal and 4 net carbs available at [place] and [place].  This can be toasted to use with hummous as well  Toufayan makes a low carb flatbread that's 100 cal and 9 net carbs available at [employer] and [employer]J's makes 2 sizes of  Low carb whole wheat tortilla  (The large one is 210 cal and 6 net carbs)  Ezekiel bread is a loaf bread sold in the frozen section of higher end grocery chains,  Very low carb  Avoid "Low fat dressings, as well as Barry Brunner and [place] dressings They are loaded with sugar!   3 PM/ Mid day  Snack:  Consider  1 ounce of  almonds, walnuts, pistachios, pecans,  peanuts,  Macadamia nuts or a nut medley.  Avoid "granola"; the dried cranberries and raisins are loaded with carbohydrates. Mixed nuts as long as there are no raisins,  cranberries or dried fruit.    Try the prosciutto/mozzarella cheese sticks by Fiorruci  In deli /backery section   High protein      6 PM  Dinner:     Meat/fowl/fish with a green salad, and either broccoli, cauliflower, green beans, spinach, brussel sprouts or  Lima beans. DO NOT BREAD THE PROTEIN!!      There is a low carb pasta by Dreamfield's that is acceptable and tastes great: only 5 digestible carbs/serving.( All grocery stores but BJs carry it )  Try Hurley Cisco Angelo's chicken piccata or chicken or eggplant parm over low carb pasta.(Lowes and BJs)   Marjory Lies Sanchez's "Carnitas" (pulled pork, no sauce,  0 carbs) or his beef pot roast to make a dinner burrito (at BJ's)  Pesto over  low carb pasta (bj's sells a good quality pesto in the center refrigerated section of the deli   Try satueeing  Cheral Marker with mushroooms  Whole wheat pasta is still full of digestible carbs and  Not as low in glycemic index as Dreamfield's.   Brown rice is still rice,  So skip the rice and noodles if you eat [place] or [place] (or at least limit to 1/2 cup)  9 PM snack :   Breyer's "low carb" fudgsicle or  ice cream bar (Carb Smart line), or  Weight Watcher's ice cream bar , or another "no sugar added" ice cream;  a serving of fresh berries/cherries with whipped cream   Cheese or DANNON'S LlGHT N FIT GREEK YOGURT  8 ounces of [place] unsweetened almond/cococunut milk    Treat yourself to a parfait made with whipped cream blueberiies, walnuts and vanilla greek yogurt  Avoid bananas, pineapple, grapes  and watermelon on a regular basis because they are high in sugar.  THINK OF THEM AS DESSERT  Remember that snack Substitutions should be less than 10 NET carbs per serving and meals < 20 carbs. Remember to subtract fiber grams to get the "net carbs."

## 2017-10-10 LAB — COMPREHENSIVE METABOLIC PANEL
ALT: 55 U/L — ABNORMAL HIGH (ref 0–53)
AST: 51 U/L — ABNORMAL HIGH (ref 0–37)
Albumin: 4 g/dL (ref 3.5–5.2)
Alkaline Phosphatase: 49 U/L (ref 39–117)
BUN: 12 mg/dL (ref 6–23)
CO2: 27 mEq/L (ref 19–32)
Calcium: 8.9 mg/dL (ref 8.4–10.5)
Chloride: 102 mEq/L (ref 96–112)
Creatinine, Ser: 0.86 mg/dL (ref 0.40–1.50)
GFR: 98.8 mL/min (ref >60.00)
Glucose, Bld: 159 mg/dL — ABNORMAL HIGH (ref 70–99)
Potassium: 3.9 mEq/L (ref 3.5–5.1)
Sodium: 136 mEq/L (ref 135–145)
Total Bilirubin: 0.7 mg/dL (ref 0.2–1.2)
Total Protein: 7.6 g/dL (ref 6.0–8.3)

## 2017-10-10 LAB — MICROALBUMIN / CREATININE URINE RATIO
Creatinine,U: 119.9 mg/dL
Microalb Creat Ratio: 2.6 mg/g (ref 0.0–30.0)
Microalb, Ur: 3.1 mg/dL — ABNORMAL HIGH (ref 0.0–1.9)

## 2017-10-10 LAB — LDL CHOLESTEROL, DIRECT: Direct LDL: 109 mg/dL

## 2017-10-10 NOTE — Assessment & Plan Note (Signed)
Guilt, lack of motivation anhedonia, weight gain.  Defers psychology referral.  Trial of wellbutrin .

## 2017-10-10 NOTE — Assessment & Plan Note (Signed)
Confirmed with repeat A1c of 6.6 today Low Gi diet recommended,  Eye exam recommended . Encouraged to engage in some daily physical activity to facilitate weight loss and reduce in insulin resistance. 3 month follow up advised

## 2017-10-10 NOTE — Assessment & Plan Note (Signed)
Remains Untreated due  financial issues . cpap titration ordered but never done due to surgery in November 2017 on right foot. He has now deferred the CPAP titration study once again due to out of pocket cost.   Discussed the long term history of OSA , the risks of long term damage to heart and the signs and symptoms attributable to OSA.  Advised patient to consider  significant weight loss and/or use of CPAP.

## 2017-10-11 ENCOUNTER — Encounter: Payer: Self-pay | Admitting: Internal Medicine

## 2017-10-11 ENCOUNTER — Other Ambulatory Visit: Payer: Self-pay | Admitting: Internal Medicine

## 2017-10-11 DIAGNOSIS — E1149 Type 2 diabetes mellitus with other diabetic neurological complication: Secondary | ICD-10-CM

## 2017-10-11 DIAGNOSIS — D582 Other hemoglobinopathies: Secondary | ICD-10-CM

## 2017-10-11 DIAGNOSIS — R748 Abnormal levels of other serum enzymes: Secondary | ICD-10-CM

## 2017-10-11 NOTE — Progress Notes (Signed)
MyChart message sent  Re abnormal labs

## 2017-10-13 DIAGNOSIS — Z9119 Patient's noncompliance with other medical treatment and regimen: Secondary | ICD-10-CM | POA: Diagnosis not present

## 2017-10-13 DIAGNOSIS — L97412 Non-pressure chronic ulcer of right heel and midfoot with fat layer exposed: Secondary | ICD-10-CM | POA: Diagnosis not present

## 2017-10-13 DIAGNOSIS — L03119 Cellulitis of unspecified part of limb: Secondary | ICD-10-CM | POA: Diagnosis not present

## 2017-10-13 DIAGNOSIS — S91309A Unspecified open wound, unspecified foot, initial encounter: Secondary | ICD-10-CM | POA: Diagnosis not present

## 2017-10-16 ENCOUNTER — Encounter: Payer: Self-pay | Admitting: Internal Medicine

## 2017-10-16 DIAGNOSIS — H9313 Tinnitus, bilateral: Secondary | ICD-10-CM

## 2017-10-17 NOTE — Telephone Encounter (Signed)
Mr Prins   Ringing in the ear is often a sign of hearing loss due to nerve damage from loud noises ,  An acoustic neuroma (tumor in the middle ear), or Medications.   It is typically evaluated  by ENT with exam and hearing test.  If you have not seen an ENT in several years , I will make a referral for you. Let me know who you would like ot see.  Regards,   Deborra Medina, MD

## 2017-10-18 NOTE — Addendum Note (Signed)
Addended by: Crecencio Mc on: 10/18/2017 11:54 AM   Modules accepted: Orders

## 2017-11-03 DIAGNOSIS — L97512 Non-pressure chronic ulcer of other part of right foot with fat layer exposed: Secondary | ICD-10-CM | POA: Diagnosis not present

## 2017-11-03 DIAGNOSIS — Z89421 Acquired absence of other right toe(s): Secondary | ICD-10-CM | POA: Diagnosis not present

## 2017-11-10 DIAGNOSIS — S91309A Unspecified open wound, unspecified foot, initial encounter: Secondary | ICD-10-CM | POA: Diagnosis not present

## 2017-11-14 ENCOUNTER — Encounter: Payer: Self-pay | Admitting: Internal Medicine

## 2017-11-16 ENCOUNTER — Other Ambulatory Visit: Payer: Self-pay | Admitting: Internal Medicine

## 2017-12-02 DIAGNOSIS — M86271 Subacute osteomyelitis, right ankle and foot: Secondary | ICD-10-CM | POA: Diagnosis not present

## 2017-12-02 DIAGNOSIS — L97512 Non-pressure chronic ulcer of other part of right foot with fat layer exposed: Secondary | ICD-10-CM | POA: Diagnosis not present

## 2017-12-02 DIAGNOSIS — L97519 Non-pressure chronic ulcer of other part of right foot with unspecified severity: Secondary | ICD-10-CM | POA: Diagnosis not present

## 2017-12-02 DIAGNOSIS — Z89421 Acquired absence of other right toe(s): Secondary | ICD-10-CM | POA: Diagnosis not present

## 2017-12-06 ENCOUNTER — Encounter: Payer: Self-pay | Admitting: Internal Medicine

## 2017-12-08 DIAGNOSIS — R2689 Other abnormalities of gait and mobility: Secondary | ICD-10-CM | POA: Diagnosis not present

## 2017-12-08 DIAGNOSIS — G4733 Obstructive sleep apnea (adult) (pediatric): Secondary | ICD-10-CM | POA: Diagnosis not present

## 2017-12-08 DIAGNOSIS — E785 Hyperlipidemia, unspecified: Secondary | ICD-10-CM | POA: Diagnosis not present

## 2017-12-08 DIAGNOSIS — E872 Acidosis: Secondary | ICD-10-CM | POA: Diagnosis not present

## 2017-12-08 DIAGNOSIS — Z89422 Acquired absence of other left toe(s): Secondary | ICD-10-CM | POA: Diagnosis not present

## 2017-12-08 DIAGNOSIS — M869 Osteomyelitis, unspecified: Secondary | ICD-10-CM | POA: Diagnosis not present

## 2017-12-08 DIAGNOSIS — G629 Polyneuropathy, unspecified: Secondary | ICD-10-CM | POA: Diagnosis not present

## 2017-12-08 DIAGNOSIS — Z89431 Acquired absence of right foot: Secondary | ICD-10-CM | POA: Diagnosis not present

## 2017-12-08 DIAGNOSIS — L97519 Non-pressure chronic ulcer of other part of right foot with unspecified severity: Secondary | ICD-10-CM | POA: Diagnosis not present

## 2017-12-08 DIAGNOSIS — M86371 Chronic multifocal osteomyelitis, right ankle and foot: Secondary | ICD-10-CM | POA: Diagnosis not present

## 2017-12-08 DIAGNOSIS — M868X7 Other osteomyelitis, ankle and foot: Secondary | ICD-10-CM | POA: Diagnosis not present

## 2017-12-08 DIAGNOSIS — M8660 Other chronic osteomyelitis, unspecified site: Secondary | ICD-10-CM | POA: Diagnosis not present

## 2017-12-08 DIAGNOSIS — E11621 Type 2 diabetes mellitus with foot ulcer: Secondary | ICD-10-CM | POA: Diagnosis not present

## 2017-12-08 DIAGNOSIS — E1169 Type 2 diabetes mellitus with other specified complication: Secondary | ICD-10-CM | POA: Diagnosis not present

## 2017-12-08 DIAGNOSIS — Z6834 Body mass index (BMI) 34.0-34.9, adult: Secondary | ICD-10-CM | POA: Diagnosis not present

## 2017-12-08 DIAGNOSIS — L97919 Non-pressure chronic ulcer of unspecified part of right lower leg with unspecified severity: Secondary | ICD-10-CM | POA: Diagnosis not present

## 2017-12-08 DIAGNOSIS — Z7982 Long term (current) use of aspirin: Secondary | ICD-10-CM | POA: Diagnosis not present

## 2017-12-08 DIAGNOSIS — M79671 Pain in right foot: Secondary | ICD-10-CM | POA: Diagnosis not present

## 2017-12-08 DIAGNOSIS — E119 Type 2 diabetes mellitus without complications: Secondary | ICD-10-CM | POA: Diagnosis not present

## 2017-12-08 DIAGNOSIS — D582 Other hemoglobinopathies: Secondary | ICD-10-CM | POA: Diagnosis not present

## 2017-12-08 DIAGNOSIS — E669 Obesity, unspecified: Secondary | ICD-10-CM | POA: Diagnosis not present

## 2017-12-08 DIAGNOSIS — L97514 Non-pressure chronic ulcer of other part of right foot with necrosis of bone: Secondary | ICD-10-CM | POA: Diagnosis not present

## 2017-12-08 DIAGNOSIS — M86271 Subacute osteomyelitis, right ankle and foot: Secondary | ICD-10-CM | POA: Diagnosis not present

## 2017-12-08 DIAGNOSIS — E1142 Type 2 diabetes mellitus with diabetic polyneuropathy: Secondary | ICD-10-CM | POA: Diagnosis not present

## 2017-12-08 DIAGNOSIS — L97511 Non-pressure chronic ulcer of other part of right foot limited to breakdown of skin: Secondary | ICD-10-CM | POA: Diagnosis not present

## 2017-12-08 DIAGNOSIS — L97414 Non-pressure chronic ulcer of right heel and midfoot with necrosis of bone: Secondary | ICD-10-CM | POA: Diagnosis not present

## 2017-12-08 DIAGNOSIS — T8789 Other complications of amputation stump: Secondary | ICD-10-CM | POA: Diagnosis not present

## 2017-12-08 DIAGNOSIS — G609 Hereditary and idiopathic neuropathy, unspecified: Secondary | ICD-10-CM | POA: Diagnosis not present

## 2017-12-08 DIAGNOSIS — Z89421 Acquired absence of other right toe(s): Secondary | ICD-10-CM | POA: Diagnosis not present

## 2017-12-08 DIAGNOSIS — L97512 Non-pressure chronic ulcer of other part of right foot with fat layer exposed: Secondary | ICD-10-CM | POA: Diagnosis not present

## 2017-12-08 LAB — HEMOGLOBIN A1C

## 2017-12-09 DIAGNOSIS — Z89431 Acquired absence of right foot: Secondary | ICD-10-CM | POA: Diagnosis not present

## 2017-12-09 DIAGNOSIS — L97514 Non-pressure chronic ulcer of other part of right foot with necrosis of bone: Secondary | ICD-10-CM | POA: Diagnosis not present

## 2017-12-09 DIAGNOSIS — E1169 Type 2 diabetes mellitus with other specified complication: Secondary | ICD-10-CM | POA: Diagnosis not present

## 2017-12-09 DIAGNOSIS — E872 Acidosis: Secondary | ICD-10-CM | POA: Diagnosis not present

## 2017-12-09 DIAGNOSIS — M86371 Chronic multifocal osteomyelitis, right ankle and foot: Secondary | ICD-10-CM | POA: Diagnosis not present

## 2017-12-10 DIAGNOSIS — Z89431 Acquired absence of right foot: Secondary | ICD-10-CM | POA: Diagnosis not present

## 2017-12-10 DIAGNOSIS — L97519 Non-pressure chronic ulcer of other part of right foot with unspecified severity: Secondary | ICD-10-CM | POA: Diagnosis not present

## 2017-12-10 DIAGNOSIS — E872 Acidosis: Secondary | ICD-10-CM | POA: Diagnosis not present

## 2017-12-10 DIAGNOSIS — M86371 Chronic multifocal osteomyelitis, right ankle and foot: Secondary | ICD-10-CM | POA: Diagnosis not present

## 2017-12-10 DIAGNOSIS — E1169 Type 2 diabetes mellitus with other specified complication: Secondary | ICD-10-CM | POA: Diagnosis not present

## 2017-12-11 DIAGNOSIS — L97514 Non-pressure chronic ulcer of other part of right foot with necrosis of bone: Secondary | ICD-10-CM | POA: Diagnosis not present

## 2017-12-11 DIAGNOSIS — E119 Type 2 diabetes mellitus without complications: Secondary | ICD-10-CM | POA: Diagnosis not present

## 2017-12-11 DIAGNOSIS — M86371 Chronic multifocal osteomyelitis, right ankle and foot: Secondary | ICD-10-CM | POA: Diagnosis not present

## 2017-12-11 DIAGNOSIS — M869 Osteomyelitis, unspecified: Secondary | ICD-10-CM | POA: Diagnosis not present

## 2017-12-11 DIAGNOSIS — R2689 Other abnormalities of gait and mobility: Secondary | ICD-10-CM | POA: Diagnosis not present

## 2017-12-11 DIAGNOSIS — T8789 Other complications of amputation stump: Secondary | ICD-10-CM | POA: Diagnosis not present

## 2017-12-11 DIAGNOSIS — E872 Acidosis: Secondary | ICD-10-CM | POA: Diagnosis not present

## 2017-12-12 DIAGNOSIS — M869 Osteomyelitis, unspecified: Secondary | ICD-10-CM | POA: Diagnosis not present

## 2017-12-12 DIAGNOSIS — M86371 Chronic multifocal osteomyelitis, right ankle and foot: Secondary | ICD-10-CM | POA: Diagnosis not present

## 2017-12-12 DIAGNOSIS — E119 Type 2 diabetes mellitus without complications: Secondary | ICD-10-CM | POA: Diagnosis not present

## 2017-12-12 DIAGNOSIS — M8660 Other chronic osteomyelitis, unspecified site: Secondary | ICD-10-CM | POA: Diagnosis not present

## 2017-12-12 DIAGNOSIS — E1169 Type 2 diabetes mellitus with other specified complication: Secondary | ICD-10-CM | POA: Diagnosis not present

## 2017-12-12 DIAGNOSIS — Z89431 Acquired absence of right foot: Secondary | ICD-10-CM | POA: Diagnosis not present

## 2017-12-12 DIAGNOSIS — L97414 Non-pressure chronic ulcer of right heel and midfoot with necrosis of bone: Secondary | ICD-10-CM | POA: Diagnosis not present

## 2017-12-12 DIAGNOSIS — L97514 Non-pressure chronic ulcer of other part of right foot with necrosis of bone: Secondary | ICD-10-CM | POA: Diagnosis not present

## 2017-12-13 DIAGNOSIS — M869 Osteomyelitis, unspecified: Secondary | ICD-10-CM | POA: Diagnosis not present

## 2017-12-13 DIAGNOSIS — E872 Acidosis: Secondary | ICD-10-CM | POA: Diagnosis not present

## 2017-12-13 DIAGNOSIS — E1169 Type 2 diabetes mellitus with other specified complication: Secondary | ICD-10-CM | POA: Diagnosis not present

## 2017-12-13 DIAGNOSIS — M86371 Chronic multifocal osteomyelitis, right ankle and foot: Secondary | ICD-10-CM | POA: Diagnosis not present

## 2017-12-13 DIAGNOSIS — E119 Type 2 diabetes mellitus without complications: Secondary | ICD-10-CM | POA: Diagnosis not present

## 2017-12-13 DIAGNOSIS — T8789 Other complications of amputation stump: Secondary | ICD-10-CM | POA: Diagnosis not present

## 2017-12-13 DIAGNOSIS — L97514 Non-pressure chronic ulcer of other part of right foot with necrosis of bone: Secondary | ICD-10-CM | POA: Diagnosis not present

## 2017-12-14 ENCOUNTER — Encounter: Payer: Self-pay | Admitting: Internal Medicine

## 2017-12-15 DIAGNOSIS — Z6835 Body mass index (BMI) 35.0-35.9, adult: Secondary | ICD-10-CM | POA: Diagnosis not present

## 2017-12-15 DIAGNOSIS — E1169 Type 2 diabetes mellitus with other specified complication: Secondary | ICD-10-CM | POA: Diagnosis not present

## 2017-12-15 DIAGNOSIS — M86371 Chronic multifocal osteomyelitis, right ankle and foot: Secondary | ICD-10-CM | POA: Diagnosis not present

## 2017-12-15 DIAGNOSIS — Z9181 History of falling: Secondary | ICD-10-CM | POA: Diagnosis not present

## 2017-12-15 DIAGNOSIS — E1142 Type 2 diabetes mellitus with diabetic polyneuropathy: Secondary | ICD-10-CM | POA: Diagnosis not present

## 2017-12-15 DIAGNOSIS — E11621 Type 2 diabetes mellitus with foot ulcer: Secondary | ICD-10-CM | POA: Diagnosis not present

## 2017-12-15 DIAGNOSIS — L97514 Non-pressure chronic ulcer of other part of right foot with necrosis of bone: Secondary | ICD-10-CM | POA: Diagnosis not present

## 2017-12-15 DIAGNOSIS — Z79891 Long term (current) use of opiate analgesic: Secondary | ICD-10-CM | POA: Diagnosis not present

## 2017-12-15 DIAGNOSIS — E669 Obesity, unspecified: Secondary | ICD-10-CM | POA: Diagnosis not present

## 2017-12-15 DIAGNOSIS — G4733 Obstructive sleep apnea (adult) (pediatric): Secondary | ICD-10-CM | POA: Diagnosis not present

## 2017-12-20 ENCOUNTER — Telehealth: Payer: Self-pay | Admitting: Internal Medicine

## 2017-12-20 DIAGNOSIS — E1169 Type 2 diabetes mellitus with other specified complication: Secondary | ICD-10-CM | POA: Diagnosis not present

## 2017-12-20 DIAGNOSIS — M86371 Chronic multifocal osteomyelitis, right ankle and foot: Secondary | ICD-10-CM | POA: Diagnosis not present

## 2017-12-20 DIAGNOSIS — M86471 Chronic osteomyelitis with draining sinus, right ankle and foot: Secondary | ICD-10-CM | POA: Diagnosis not present

## 2017-12-20 DIAGNOSIS — E669 Obesity, unspecified: Secondary | ICD-10-CM | POA: Diagnosis not present

## 2017-12-20 DIAGNOSIS — L97412 Non-pressure chronic ulcer of right heel and midfoot with fat layer exposed: Secondary | ICD-10-CM | POA: Diagnosis not present

## 2017-12-20 DIAGNOSIS — E1142 Type 2 diabetes mellitus with diabetic polyneuropathy: Secondary | ICD-10-CM | POA: Diagnosis not present

## 2017-12-20 DIAGNOSIS — Z6835 Body mass index (BMI) 35.0-35.9, adult: Secondary | ICD-10-CM | POA: Diagnosis not present

## 2017-12-20 DIAGNOSIS — M79671 Pain in right foot: Secondary | ICD-10-CM | POA: Diagnosis not present

## 2017-12-20 DIAGNOSIS — Z9181 History of falling: Secondary | ICD-10-CM | POA: Diagnosis not present

## 2017-12-20 DIAGNOSIS — G4733 Obstructive sleep apnea (adult) (pediatric): Secondary | ICD-10-CM | POA: Diagnosis not present

## 2017-12-20 DIAGNOSIS — L97514 Non-pressure chronic ulcer of other part of right foot with necrosis of bone: Secondary | ICD-10-CM | POA: Diagnosis not present

## 2017-12-20 DIAGNOSIS — Z79891 Long term (current) use of opiate analgesic: Secondary | ICD-10-CM | POA: Diagnosis not present

## 2017-12-20 DIAGNOSIS — M7981 Nontraumatic hematoma of soft tissue: Secondary | ICD-10-CM | POA: Diagnosis not present

## 2017-12-20 DIAGNOSIS — E11621 Type 2 diabetes mellitus with foot ulcer: Secondary | ICD-10-CM | POA: Diagnosis not present

## 2017-12-20 DIAGNOSIS — L03115 Cellulitis of right lower limb: Secondary | ICD-10-CM | POA: Diagnosis not present

## 2017-12-20 NOTE — Telephone Encounter (Signed)
Copied from [place]. Topic: Quick Communication - See Telephone Encounter >> Dec 20, 2017 11:37 AM Ivar Drape wrote: CRM for notification. See Telephone encounter for: 12/20/17. Steffanie Dunn w/[facility] [phone] would like a verbal order saying it is okay for them to provide for the patient's womb care and home health.

## 2017-12-20 NOTE — Telephone Encounter (Signed)
PATIENT DOES NOT HAVE A WOMB!!!! WHERE DO THEY GET THESE PEOPLE?   Wound care orders need to come from his surgeons who created the wound

## 2017-12-20 NOTE — Telephone Encounter (Signed)
Is it okay to give verbal orders?  

## 2017-12-21 DIAGNOSIS — Z79891 Long term (current) use of opiate analgesic: Secondary | ICD-10-CM | POA: Diagnosis not present

## 2017-12-21 DIAGNOSIS — Z9181 History of falling: Secondary | ICD-10-CM | POA: Diagnosis not present

## 2017-12-21 DIAGNOSIS — L97514 Non-pressure chronic ulcer of other part of right foot with necrosis of bone: Secondary | ICD-10-CM | POA: Diagnosis not present

## 2017-12-21 DIAGNOSIS — M86371 Chronic multifocal osteomyelitis, right ankle and foot: Secondary | ICD-10-CM | POA: Diagnosis not present

## 2017-12-21 DIAGNOSIS — E669 Obesity, unspecified: Secondary | ICD-10-CM | POA: Diagnosis not present

## 2017-12-21 DIAGNOSIS — Z6835 Body mass index (BMI) 35.0-35.9, adult: Secondary | ICD-10-CM | POA: Diagnosis not present

## 2017-12-21 DIAGNOSIS — E1169 Type 2 diabetes mellitus with other specified complication: Secondary | ICD-10-CM | POA: Diagnosis not present

## 2017-12-21 DIAGNOSIS — G4733 Obstructive sleep apnea (adult) (pediatric): Secondary | ICD-10-CM | POA: Diagnosis not present

## 2017-12-21 DIAGNOSIS — E1142 Type 2 diabetes mellitus with diabetic polyneuropathy: Secondary | ICD-10-CM | POA: Diagnosis not present

## 2017-12-21 DIAGNOSIS — E11621 Type 2 diabetes mellitus with foot ulcer: Secondary | ICD-10-CM | POA: Diagnosis not present

## 2017-12-21 NOTE — Telephone Encounter (Signed)
Attempted to call [name] with [facility] no answer and mailbox is full. If [name] calls back please transfer her to our office.

## 2017-12-21 NOTE — Telephone Encounter (Signed)
Yes I will

## 2017-12-21 NOTE — Telephone Encounter (Signed)
Spoke with Steffanie Dunn and she is wanting to know if you would be willing to give verbal orders for home health for nursing, and medication management and they would contact the surgeons office for verbal orders for wound care.

## 2017-12-22 NOTE — Telephone Encounter (Signed)
Verbal given 

## 2017-12-27 DIAGNOSIS — E11621 Type 2 diabetes mellitus with foot ulcer: Secondary | ICD-10-CM | POA: Diagnosis not present

## 2017-12-27 DIAGNOSIS — Z79891 Long term (current) use of opiate analgesic: Secondary | ICD-10-CM | POA: Diagnosis not present

## 2017-12-27 DIAGNOSIS — E1142 Type 2 diabetes mellitus with diabetic polyneuropathy: Secondary | ICD-10-CM | POA: Diagnosis not present

## 2017-12-27 DIAGNOSIS — E1169 Type 2 diabetes mellitus with other specified complication: Secondary | ICD-10-CM | POA: Diagnosis not present

## 2017-12-27 DIAGNOSIS — M86371 Chronic multifocal osteomyelitis, right ankle and foot: Secondary | ICD-10-CM | POA: Diagnosis not present

## 2017-12-27 DIAGNOSIS — Z6835 Body mass index (BMI) 35.0-35.9, adult: Secondary | ICD-10-CM | POA: Diagnosis not present

## 2017-12-27 DIAGNOSIS — Z9181 History of falling: Secondary | ICD-10-CM | POA: Diagnosis not present

## 2017-12-27 DIAGNOSIS — L97514 Non-pressure chronic ulcer of other part of right foot with necrosis of bone: Secondary | ICD-10-CM | POA: Diagnosis not present

## 2017-12-27 DIAGNOSIS — E669 Obesity, unspecified: Secondary | ICD-10-CM | POA: Diagnosis not present

## 2017-12-27 DIAGNOSIS — G4733 Obstructive sleep apnea (adult) (pediatric): Secondary | ICD-10-CM | POA: Diagnosis not present

## 2017-12-28 DIAGNOSIS — T8189XA Other complications of procedures, not elsewhere classified, initial encounter: Secondary | ICD-10-CM | POA: Diagnosis not present

## 2017-12-29 DIAGNOSIS — Z6835 Body mass index (BMI) 35.0-35.9, adult: Secondary | ICD-10-CM | POA: Diagnosis not present

## 2017-12-29 DIAGNOSIS — Z9181 History of falling: Secondary | ICD-10-CM | POA: Diagnosis not present

## 2017-12-29 DIAGNOSIS — Z79891 Long term (current) use of opiate analgesic: Secondary | ICD-10-CM | POA: Diagnosis not present

## 2017-12-29 DIAGNOSIS — E1169 Type 2 diabetes mellitus with other specified complication: Secondary | ICD-10-CM | POA: Diagnosis not present

## 2017-12-29 DIAGNOSIS — G4733 Obstructive sleep apnea (adult) (pediatric): Secondary | ICD-10-CM | POA: Diagnosis not present

## 2017-12-29 DIAGNOSIS — E11621 Type 2 diabetes mellitus with foot ulcer: Secondary | ICD-10-CM | POA: Diagnosis not present

## 2017-12-29 DIAGNOSIS — L97514 Non-pressure chronic ulcer of other part of right foot with necrosis of bone: Secondary | ICD-10-CM | POA: Diagnosis not present

## 2017-12-29 DIAGNOSIS — M86371 Chronic multifocal osteomyelitis, right ankle and foot: Secondary | ICD-10-CM | POA: Diagnosis not present

## 2017-12-29 DIAGNOSIS — E669 Obesity, unspecified: Secondary | ICD-10-CM | POA: Diagnosis not present

## 2017-12-29 DIAGNOSIS — E1142 Type 2 diabetes mellitus with diabetic polyneuropathy: Secondary | ICD-10-CM | POA: Diagnosis not present

## 2018-01-02 ENCOUNTER — Other Ambulatory Visit: Payer: Self-pay | Admitting: Internal Medicine

## 2018-01-03 DIAGNOSIS — E11621 Type 2 diabetes mellitus with foot ulcer: Secondary | ICD-10-CM | POA: Diagnosis not present

## 2018-01-03 DIAGNOSIS — L97412 Non-pressure chronic ulcer of right heel and midfoot with fat layer exposed: Secondary | ICD-10-CM | POA: Diagnosis not present

## 2018-01-03 DIAGNOSIS — M86671 Other chronic osteomyelitis, right ankle and foot: Secondary | ICD-10-CM | POA: Diagnosis not present

## 2018-01-03 DIAGNOSIS — L97414 Non-pressure chronic ulcer of right heel and midfoot with necrosis of bone: Secondary | ICD-10-CM | POA: Diagnosis not present

## 2018-01-03 DIAGNOSIS — L97512 Non-pressure chronic ulcer of other part of right foot with fat layer exposed: Secondary | ICD-10-CM | POA: Diagnosis not present

## 2018-01-03 DIAGNOSIS — M85871 Other specified disorders of bone density and structure, right ankle and foot: Secondary | ICD-10-CM | POA: Diagnosis not present

## 2018-01-03 NOTE — Telephone Encounter (Signed)
Refilled: 08/31/2017 Last OV: 10/09/2017 Next OV: 01/10/2018

## 2018-01-05 DIAGNOSIS — E1169 Type 2 diabetes mellitus with other specified complication: Secondary | ICD-10-CM | POA: Diagnosis not present

## 2018-01-05 DIAGNOSIS — Z79891 Long term (current) use of opiate analgesic: Secondary | ICD-10-CM | POA: Diagnosis not present

## 2018-01-05 DIAGNOSIS — Z9181 History of falling: Secondary | ICD-10-CM | POA: Diagnosis not present

## 2018-01-05 DIAGNOSIS — E11621 Type 2 diabetes mellitus with foot ulcer: Secondary | ICD-10-CM | POA: Diagnosis not present

## 2018-01-05 DIAGNOSIS — L97514 Non-pressure chronic ulcer of other part of right foot with necrosis of bone: Secondary | ICD-10-CM | POA: Diagnosis not present

## 2018-01-05 DIAGNOSIS — Z6835 Body mass index (BMI) 35.0-35.9, adult: Secondary | ICD-10-CM | POA: Diagnosis not present

## 2018-01-05 DIAGNOSIS — E1142 Type 2 diabetes mellitus with diabetic polyneuropathy: Secondary | ICD-10-CM | POA: Diagnosis not present

## 2018-01-05 DIAGNOSIS — G4733 Obstructive sleep apnea (adult) (pediatric): Secondary | ICD-10-CM | POA: Diagnosis not present

## 2018-01-05 DIAGNOSIS — E669 Obesity, unspecified: Secondary | ICD-10-CM | POA: Diagnosis not present

## 2018-01-05 DIAGNOSIS — M86371 Chronic multifocal osteomyelitis, right ankle and foot: Secondary | ICD-10-CM | POA: Diagnosis not present

## 2018-01-08 DIAGNOSIS — M86371 Chronic multifocal osteomyelitis, right ankle and foot: Secondary | ICD-10-CM | POA: Diagnosis not present

## 2018-01-08 DIAGNOSIS — E1142 Type 2 diabetes mellitus with diabetic polyneuropathy: Secondary | ICD-10-CM | POA: Diagnosis not present

## 2018-01-08 DIAGNOSIS — L97519 Non-pressure chronic ulcer of other part of right foot with unspecified severity: Secondary | ICD-10-CM | POA: Diagnosis not present

## 2018-01-08 DIAGNOSIS — L97514 Non-pressure chronic ulcer of other part of right foot with necrosis of bone: Secondary | ICD-10-CM | POA: Diagnosis not present

## 2018-01-08 DIAGNOSIS — Z6835 Body mass index (BMI) 35.0-35.9, adult: Secondary | ICD-10-CM | POA: Diagnosis not present

## 2018-01-08 DIAGNOSIS — Z9181 History of falling: Secondary | ICD-10-CM | POA: Diagnosis not present

## 2018-01-08 DIAGNOSIS — Z79891 Long term (current) use of opiate analgesic: Secondary | ICD-10-CM | POA: Diagnosis not present

## 2018-01-08 DIAGNOSIS — E1169 Type 2 diabetes mellitus with other specified complication: Secondary | ICD-10-CM | POA: Diagnosis not present

## 2018-01-08 DIAGNOSIS — E11621 Type 2 diabetes mellitus with foot ulcer: Secondary | ICD-10-CM | POA: Diagnosis not present

## 2018-01-08 DIAGNOSIS — G4733 Obstructive sleep apnea (adult) (pediatric): Secondary | ICD-10-CM | POA: Diagnosis not present

## 2018-01-08 DIAGNOSIS — E669 Obesity, unspecified: Secondary | ICD-10-CM | POA: Diagnosis not present

## 2018-01-09 DIAGNOSIS — E11621 Type 2 diabetes mellitus with foot ulcer: Secondary | ICD-10-CM | POA: Diagnosis not present

## 2018-01-09 DIAGNOSIS — L97519 Non-pressure chronic ulcer of other part of right foot with unspecified severity: Secondary | ICD-10-CM | POA: Diagnosis not present

## 2018-01-10 ENCOUNTER — Ambulatory Visit: Payer: BLUE CROSS/BLUE SHIELD | Admitting: Internal Medicine

## 2018-01-10 DIAGNOSIS — Z79891 Long term (current) use of opiate analgesic: Secondary | ICD-10-CM | POA: Diagnosis not present

## 2018-01-10 DIAGNOSIS — M86371 Chronic multifocal osteomyelitis, right ankle and foot: Secondary | ICD-10-CM | POA: Diagnosis not present

## 2018-01-10 DIAGNOSIS — Z0289 Encounter for other administrative examinations: Secondary | ICD-10-CM

## 2018-01-10 DIAGNOSIS — Z9181 History of falling: Secondary | ICD-10-CM | POA: Diagnosis not present

## 2018-01-10 DIAGNOSIS — Z6835 Body mass index (BMI) 35.0-35.9, adult: Secondary | ICD-10-CM | POA: Diagnosis not present

## 2018-01-10 DIAGNOSIS — G4733 Obstructive sleep apnea (adult) (pediatric): Secondary | ICD-10-CM | POA: Diagnosis not present

## 2018-01-10 DIAGNOSIS — E1142 Type 2 diabetes mellitus with diabetic polyneuropathy: Secondary | ICD-10-CM | POA: Diagnosis not present

## 2018-01-10 DIAGNOSIS — E669 Obesity, unspecified: Secondary | ICD-10-CM | POA: Diagnosis not present

## 2018-01-10 DIAGNOSIS — L97514 Non-pressure chronic ulcer of other part of right foot with necrosis of bone: Secondary | ICD-10-CM | POA: Diagnosis not present

## 2018-01-10 DIAGNOSIS — E1169 Type 2 diabetes mellitus with other specified complication: Secondary | ICD-10-CM | POA: Diagnosis not present

## 2018-01-10 DIAGNOSIS — L97519 Non-pressure chronic ulcer of other part of right foot with unspecified severity: Secondary | ICD-10-CM | POA: Diagnosis not present

## 2018-01-10 DIAGNOSIS — E11621 Type 2 diabetes mellitus with foot ulcer: Secondary | ICD-10-CM | POA: Diagnosis not present

## 2018-01-11 DIAGNOSIS — L97519 Non-pressure chronic ulcer of other part of right foot with unspecified severity: Secondary | ICD-10-CM | POA: Diagnosis not present

## 2018-01-11 DIAGNOSIS — E11621 Type 2 diabetes mellitus with foot ulcer: Secondary | ICD-10-CM | POA: Diagnosis not present

## 2018-01-12 DIAGNOSIS — E669 Obesity, unspecified: Secondary | ICD-10-CM | POA: Diagnosis not present

## 2018-01-12 DIAGNOSIS — G4733 Obstructive sleep apnea (adult) (pediatric): Secondary | ICD-10-CM | POA: Diagnosis not present

## 2018-01-12 DIAGNOSIS — L97514 Non-pressure chronic ulcer of other part of right foot with necrosis of bone: Secondary | ICD-10-CM | POA: Diagnosis not present

## 2018-01-12 DIAGNOSIS — Z6835 Body mass index (BMI) 35.0-35.9, adult: Secondary | ICD-10-CM | POA: Diagnosis not present

## 2018-01-12 DIAGNOSIS — E1169 Type 2 diabetes mellitus with other specified complication: Secondary | ICD-10-CM | POA: Diagnosis not present

## 2018-01-12 DIAGNOSIS — M86371 Chronic multifocal osteomyelitis, right ankle and foot: Secondary | ICD-10-CM | POA: Diagnosis not present

## 2018-01-12 DIAGNOSIS — E1142 Type 2 diabetes mellitus with diabetic polyneuropathy: Secondary | ICD-10-CM | POA: Diagnosis not present

## 2018-01-12 DIAGNOSIS — E11621 Type 2 diabetes mellitus with foot ulcer: Secondary | ICD-10-CM | POA: Diagnosis not present

## 2018-01-12 DIAGNOSIS — L97519 Non-pressure chronic ulcer of other part of right foot with unspecified severity: Secondary | ICD-10-CM | POA: Diagnosis not present

## 2018-01-12 DIAGNOSIS — Z79891 Long term (current) use of opiate analgesic: Secondary | ICD-10-CM | POA: Diagnosis not present

## 2018-01-12 DIAGNOSIS — Z9181 History of falling: Secondary | ICD-10-CM | POA: Diagnosis not present

## 2018-01-13 DIAGNOSIS — E11621 Type 2 diabetes mellitus with foot ulcer: Secondary | ICD-10-CM | POA: Diagnosis not present

## 2018-01-13 DIAGNOSIS — L97519 Non-pressure chronic ulcer of other part of right foot with unspecified severity: Secondary | ICD-10-CM | POA: Diagnosis not present

## 2018-01-14 DIAGNOSIS — L97519 Non-pressure chronic ulcer of other part of right foot with unspecified severity: Secondary | ICD-10-CM | POA: Diagnosis not present

## 2018-01-14 DIAGNOSIS — E11621 Type 2 diabetes mellitus with foot ulcer: Secondary | ICD-10-CM | POA: Diagnosis not present

## 2018-01-15 DIAGNOSIS — E1142 Type 2 diabetes mellitus with diabetic polyneuropathy: Secondary | ICD-10-CM | POA: Diagnosis not present

## 2018-01-15 DIAGNOSIS — E669 Obesity, unspecified: Secondary | ICD-10-CM | POA: Diagnosis not present

## 2018-01-15 DIAGNOSIS — Z9181 History of falling: Secondary | ICD-10-CM | POA: Diagnosis not present

## 2018-01-15 DIAGNOSIS — E11621 Type 2 diabetes mellitus with foot ulcer: Secondary | ICD-10-CM | POA: Diagnosis not present

## 2018-01-15 DIAGNOSIS — L97519 Non-pressure chronic ulcer of other part of right foot with unspecified severity: Secondary | ICD-10-CM | POA: Diagnosis not present

## 2018-01-15 DIAGNOSIS — M86371 Chronic multifocal osteomyelitis, right ankle and foot: Secondary | ICD-10-CM | POA: Diagnosis not present

## 2018-01-15 DIAGNOSIS — G4733 Obstructive sleep apnea (adult) (pediatric): Secondary | ICD-10-CM | POA: Diagnosis not present

## 2018-01-15 DIAGNOSIS — L97514 Non-pressure chronic ulcer of other part of right foot with necrosis of bone: Secondary | ICD-10-CM | POA: Diagnosis not present

## 2018-01-15 DIAGNOSIS — E1169 Type 2 diabetes mellitus with other specified complication: Secondary | ICD-10-CM | POA: Diagnosis not present

## 2018-01-15 DIAGNOSIS — Z79891 Long term (current) use of opiate analgesic: Secondary | ICD-10-CM | POA: Diagnosis not present

## 2018-01-15 DIAGNOSIS — Z6835 Body mass index (BMI) 35.0-35.9, adult: Secondary | ICD-10-CM | POA: Diagnosis not present

## 2018-01-16 DIAGNOSIS — L97519 Non-pressure chronic ulcer of other part of right foot with unspecified severity: Secondary | ICD-10-CM | POA: Diagnosis not present

## 2018-01-16 DIAGNOSIS — E11621 Type 2 diabetes mellitus with foot ulcer: Secondary | ICD-10-CM | POA: Diagnosis not present

## 2018-01-17 DIAGNOSIS — L97519 Non-pressure chronic ulcer of other part of right foot with unspecified severity: Secondary | ICD-10-CM | POA: Diagnosis not present

## 2018-01-17 DIAGNOSIS — E11621 Type 2 diabetes mellitus with foot ulcer: Secondary | ICD-10-CM | POA: Diagnosis not present

## 2018-01-18 DIAGNOSIS — L97514 Non-pressure chronic ulcer of other part of right foot with necrosis of bone: Secondary | ICD-10-CM | POA: Diagnosis not present

## 2018-01-18 DIAGNOSIS — L97519 Non-pressure chronic ulcer of other part of right foot with unspecified severity: Secondary | ICD-10-CM | POA: Diagnosis not present

## 2018-01-18 DIAGNOSIS — E1169 Type 2 diabetes mellitus with other specified complication: Secondary | ICD-10-CM | POA: Diagnosis not present

## 2018-01-18 DIAGNOSIS — M86371 Chronic multifocal osteomyelitis, right ankle and foot: Secondary | ICD-10-CM | POA: Diagnosis not present

## 2018-01-18 DIAGNOSIS — G4733 Obstructive sleep apnea (adult) (pediatric): Secondary | ICD-10-CM | POA: Diagnosis not present

## 2018-01-18 DIAGNOSIS — E669 Obesity, unspecified: Secondary | ICD-10-CM | POA: Diagnosis not present

## 2018-01-18 DIAGNOSIS — Z6835 Body mass index (BMI) 35.0-35.9, adult: Secondary | ICD-10-CM | POA: Diagnosis not present

## 2018-01-18 DIAGNOSIS — Z79891 Long term (current) use of opiate analgesic: Secondary | ICD-10-CM | POA: Diagnosis not present

## 2018-01-18 DIAGNOSIS — E11621 Type 2 diabetes mellitus with foot ulcer: Secondary | ICD-10-CM | POA: Diagnosis not present

## 2018-01-18 DIAGNOSIS — E1142 Type 2 diabetes mellitus with diabetic polyneuropathy: Secondary | ICD-10-CM | POA: Diagnosis not present

## 2018-01-18 DIAGNOSIS — Z9181 History of falling: Secondary | ICD-10-CM | POA: Diagnosis not present

## 2018-01-19 DIAGNOSIS — E11621 Type 2 diabetes mellitus with foot ulcer: Secondary | ICD-10-CM | POA: Diagnosis not present

## 2018-01-19 DIAGNOSIS — L97519 Non-pressure chronic ulcer of other part of right foot with unspecified severity: Secondary | ICD-10-CM | POA: Diagnosis not present

## 2018-01-20 DIAGNOSIS — Z9181 History of falling: Secondary | ICD-10-CM | POA: Diagnosis not present

## 2018-01-20 DIAGNOSIS — Z6835 Body mass index (BMI) 35.0-35.9, adult: Secondary | ICD-10-CM | POA: Diagnosis not present

## 2018-01-20 DIAGNOSIS — G4733 Obstructive sleep apnea (adult) (pediatric): Secondary | ICD-10-CM | POA: Diagnosis not present

## 2018-01-20 DIAGNOSIS — E669 Obesity, unspecified: Secondary | ICD-10-CM | POA: Diagnosis not present

## 2018-01-20 DIAGNOSIS — L97519 Non-pressure chronic ulcer of other part of right foot with unspecified severity: Secondary | ICD-10-CM | POA: Diagnosis not present

## 2018-01-20 DIAGNOSIS — E1169 Type 2 diabetes mellitus with other specified complication: Secondary | ICD-10-CM | POA: Diagnosis not present

## 2018-01-20 DIAGNOSIS — Z79891 Long term (current) use of opiate analgesic: Secondary | ICD-10-CM | POA: Diagnosis not present

## 2018-01-20 DIAGNOSIS — E11621 Type 2 diabetes mellitus with foot ulcer: Secondary | ICD-10-CM | POA: Diagnosis not present

## 2018-01-20 DIAGNOSIS — M86371 Chronic multifocal osteomyelitis, right ankle and foot: Secondary | ICD-10-CM | POA: Diagnosis not present

## 2018-01-20 DIAGNOSIS — E1142 Type 2 diabetes mellitus with diabetic polyneuropathy: Secondary | ICD-10-CM | POA: Diagnosis not present

## 2018-01-20 DIAGNOSIS — L97514 Non-pressure chronic ulcer of other part of right foot with necrosis of bone: Secondary | ICD-10-CM | POA: Diagnosis not present

## 2018-01-21 DIAGNOSIS — E11621 Type 2 diabetes mellitus with foot ulcer: Secondary | ICD-10-CM | POA: Diagnosis not present

## 2018-01-21 DIAGNOSIS — L97519 Non-pressure chronic ulcer of other part of right foot with unspecified severity: Secondary | ICD-10-CM | POA: Diagnosis not present

## 2018-01-22 DIAGNOSIS — L97514 Non-pressure chronic ulcer of other part of right foot with necrosis of bone: Secondary | ICD-10-CM | POA: Diagnosis not present

## 2018-01-22 DIAGNOSIS — G4733 Obstructive sleep apnea (adult) (pediatric): Secondary | ICD-10-CM | POA: Diagnosis not present

## 2018-01-22 DIAGNOSIS — E1169 Type 2 diabetes mellitus with other specified complication: Secondary | ICD-10-CM | POA: Diagnosis not present

## 2018-01-22 DIAGNOSIS — E669 Obesity, unspecified: Secondary | ICD-10-CM | POA: Diagnosis not present

## 2018-01-22 DIAGNOSIS — E11621 Type 2 diabetes mellitus with foot ulcer: Secondary | ICD-10-CM | POA: Diagnosis not present

## 2018-01-22 DIAGNOSIS — E1142 Type 2 diabetes mellitus with diabetic polyneuropathy: Secondary | ICD-10-CM | POA: Diagnosis not present

## 2018-01-22 DIAGNOSIS — M86371 Chronic multifocal osteomyelitis, right ankle and foot: Secondary | ICD-10-CM | POA: Diagnosis not present

## 2018-01-22 DIAGNOSIS — L97519 Non-pressure chronic ulcer of other part of right foot with unspecified severity: Secondary | ICD-10-CM | POA: Diagnosis not present

## 2018-01-22 DIAGNOSIS — Z9181 History of falling: Secondary | ICD-10-CM | POA: Diagnosis not present

## 2018-01-22 DIAGNOSIS — Z6835 Body mass index (BMI) 35.0-35.9, adult: Secondary | ICD-10-CM | POA: Diagnosis not present

## 2018-01-22 DIAGNOSIS — Z79891 Long term (current) use of opiate analgesic: Secondary | ICD-10-CM | POA: Diagnosis not present

## 2018-01-23 DIAGNOSIS — L97519 Non-pressure chronic ulcer of other part of right foot with unspecified severity: Secondary | ICD-10-CM | POA: Diagnosis not present

## 2018-01-23 DIAGNOSIS — E11621 Type 2 diabetes mellitus with foot ulcer: Secondary | ICD-10-CM | POA: Diagnosis not present

## 2018-01-24 DIAGNOSIS — E1142 Type 2 diabetes mellitus with diabetic polyneuropathy: Secondary | ICD-10-CM | POA: Diagnosis not present

## 2018-01-24 DIAGNOSIS — M86371 Chronic multifocal osteomyelitis, right ankle and foot: Secondary | ICD-10-CM | POA: Diagnosis not present

## 2018-01-24 DIAGNOSIS — Z79891 Long term (current) use of opiate analgesic: Secondary | ICD-10-CM | POA: Diagnosis not present

## 2018-01-24 DIAGNOSIS — E1169 Type 2 diabetes mellitus with other specified complication: Secondary | ICD-10-CM | POA: Diagnosis not present

## 2018-01-24 DIAGNOSIS — L97514 Non-pressure chronic ulcer of other part of right foot with necrosis of bone: Secondary | ICD-10-CM | POA: Diagnosis not present

## 2018-01-24 DIAGNOSIS — Z9181 History of falling: Secondary | ICD-10-CM | POA: Diagnosis not present

## 2018-01-24 DIAGNOSIS — Z6835 Body mass index (BMI) 35.0-35.9, adult: Secondary | ICD-10-CM | POA: Diagnosis not present

## 2018-01-24 DIAGNOSIS — G4733 Obstructive sleep apnea (adult) (pediatric): Secondary | ICD-10-CM | POA: Diagnosis not present

## 2018-01-24 DIAGNOSIS — E669 Obesity, unspecified: Secondary | ICD-10-CM | POA: Diagnosis not present

## 2018-01-24 DIAGNOSIS — E11621 Type 2 diabetes mellitus with foot ulcer: Secondary | ICD-10-CM | POA: Diagnosis not present

## 2018-01-24 DIAGNOSIS — L97519 Non-pressure chronic ulcer of other part of right foot with unspecified severity: Secondary | ICD-10-CM | POA: Diagnosis not present

## 2018-01-25 DIAGNOSIS — E11621 Type 2 diabetes mellitus with foot ulcer: Secondary | ICD-10-CM | POA: Diagnosis not present

## 2018-01-25 DIAGNOSIS — L97519 Non-pressure chronic ulcer of other part of right foot with unspecified severity: Secondary | ICD-10-CM | POA: Diagnosis not present

## 2018-01-26 DIAGNOSIS — E669 Obesity, unspecified: Secondary | ICD-10-CM | POA: Diagnosis not present

## 2018-01-26 DIAGNOSIS — M86371 Chronic multifocal osteomyelitis, right ankle and foot: Secondary | ICD-10-CM | POA: Diagnosis not present

## 2018-01-26 DIAGNOSIS — L97519 Non-pressure chronic ulcer of other part of right foot with unspecified severity: Secondary | ICD-10-CM | POA: Diagnosis not present

## 2018-01-26 DIAGNOSIS — E11621 Type 2 diabetes mellitus with foot ulcer: Secondary | ICD-10-CM | POA: Diagnosis not present

## 2018-01-26 DIAGNOSIS — Z79891 Long term (current) use of opiate analgesic: Secondary | ICD-10-CM | POA: Diagnosis not present

## 2018-01-26 DIAGNOSIS — G4733 Obstructive sleep apnea (adult) (pediatric): Secondary | ICD-10-CM | POA: Diagnosis not present

## 2018-01-26 DIAGNOSIS — Z9181 History of falling: Secondary | ICD-10-CM | POA: Diagnosis not present

## 2018-01-26 DIAGNOSIS — L97514 Non-pressure chronic ulcer of other part of right foot with necrosis of bone: Secondary | ICD-10-CM | POA: Diagnosis not present

## 2018-01-26 DIAGNOSIS — Z6835 Body mass index (BMI) 35.0-35.9, adult: Secondary | ICD-10-CM | POA: Diagnosis not present

## 2018-01-26 DIAGNOSIS — E1169 Type 2 diabetes mellitus with other specified complication: Secondary | ICD-10-CM | POA: Diagnosis not present

## 2018-01-26 DIAGNOSIS — E1142 Type 2 diabetes mellitus with diabetic polyneuropathy: Secondary | ICD-10-CM | POA: Diagnosis not present

## 2018-01-27 DIAGNOSIS — E11621 Type 2 diabetes mellitus with foot ulcer: Secondary | ICD-10-CM | POA: Diagnosis not present

## 2018-01-27 DIAGNOSIS — L97519 Non-pressure chronic ulcer of other part of right foot with unspecified severity: Secondary | ICD-10-CM | POA: Diagnosis not present

## 2018-01-28 DIAGNOSIS — E11621 Type 2 diabetes mellitus with foot ulcer: Secondary | ICD-10-CM | POA: Diagnosis not present

## 2018-01-28 DIAGNOSIS — L97519 Non-pressure chronic ulcer of other part of right foot with unspecified severity: Secondary | ICD-10-CM | POA: Diagnosis not present

## 2018-01-29 DIAGNOSIS — E669 Obesity, unspecified: Secondary | ICD-10-CM | POA: Diagnosis not present

## 2018-01-29 DIAGNOSIS — Z9181 History of falling: Secondary | ICD-10-CM | POA: Diagnosis not present

## 2018-01-29 DIAGNOSIS — Z6835 Body mass index (BMI) 35.0-35.9, adult: Secondary | ICD-10-CM | POA: Diagnosis not present

## 2018-01-29 DIAGNOSIS — E1169 Type 2 diabetes mellitus with other specified complication: Secondary | ICD-10-CM | POA: Diagnosis not present

## 2018-01-29 DIAGNOSIS — Z79891 Long term (current) use of opiate analgesic: Secondary | ICD-10-CM | POA: Diagnosis not present

## 2018-01-29 DIAGNOSIS — E11621 Type 2 diabetes mellitus with foot ulcer: Secondary | ICD-10-CM | POA: Diagnosis not present

## 2018-01-29 DIAGNOSIS — L97519 Non-pressure chronic ulcer of other part of right foot with unspecified severity: Secondary | ICD-10-CM | POA: Diagnosis not present

## 2018-01-29 DIAGNOSIS — L97514 Non-pressure chronic ulcer of other part of right foot with necrosis of bone: Secondary | ICD-10-CM | POA: Diagnosis not present

## 2018-01-29 DIAGNOSIS — G4733 Obstructive sleep apnea (adult) (pediatric): Secondary | ICD-10-CM | POA: Diagnosis not present

## 2018-01-29 DIAGNOSIS — M86371 Chronic multifocal osteomyelitis, right ankle and foot: Secondary | ICD-10-CM | POA: Diagnosis not present

## 2018-01-29 DIAGNOSIS — E1142 Type 2 diabetes mellitus with diabetic polyneuropathy: Secondary | ICD-10-CM | POA: Diagnosis not present

## 2018-01-30 DIAGNOSIS — L97519 Non-pressure chronic ulcer of other part of right foot with unspecified severity: Secondary | ICD-10-CM | POA: Diagnosis not present

## 2018-01-30 DIAGNOSIS — E11621 Type 2 diabetes mellitus with foot ulcer: Secondary | ICD-10-CM | POA: Diagnosis not present

## 2018-01-31 ENCOUNTER — Telehealth: Payer: Self-pay

## 2018-01-31 DIAGNOSIS — L97414 Non-pressure chronic ulcer of right heel and midfoot with necrosis of bone: Secondary | ICD-10-CM | POA: Diagnosis not present

## 2018-01-31 DIAGNOSIS — L97519 Non-pressure chronic ulcer of other part of right foot with unspecified severity: Secondary | ICD-10-CM | POA: Diagnosis not present

## 2018-01-31 DIAGNOSIS — M869 Osteomyelitis, unspecified: Secondary | ICD-10-CM | POA: Diagnosis not present

## 2018-01-31 DIAGNOSIS — E11621 Type 2 diabetes mellitus with foot ulcer: Secondary | ICD-10-CM | POA: Diagnosis not present

## 2018-01-31 DIAGNOSIS — Z6835 Body mass index (BMI) 35.0-35.9, adult: Secondary | ICD-10-CM | POA: Diagnosis not present

## 2018-01-31 DIAGNOSIS — M86671 Other chronic osteomyelitis, right ankle and foot: Secondary | ICD-10-CM | POA: Diagnosis not present

## 2018-01-31 NOTE — Telephone Encounter (Signed)
Copied from [place] [phone]. Topic: Quick Communication - See Telephone Encounter >> Jan 31, 2018 10:33 AM Nils Flack wrote: CRM for notification. See Telephone encounter for: 01/31/18. Pt has enrolled in case mgmt for chronic wound that pt has. Judeen Hammans with [employer] [phone] Please call with any questions

## 2018-01-31 NOTE — Telephone Encounter (Signed)
FYI

## 2018-02-01 DIAGNOSIS — E11621 Type 2 diabetes mellitus with foot ulcer: Secondary | ICD-10-CM | POA: Diagnosis not present

## 2018-02-01 DIAGNOSIS — L97519 Non-pressure chronic ulcer of other part of right foot with unspecified severity: Secondary | ICD-10-CM | POA: Diagnosis not present

## 2018-02-02 DIAGNOSIS — L97519 Non-pressure chronic ulcer of other part of right foot with unspecified severity: Secondary | ICD-10-CM | POA: Diagnosis not present

## 2018-02-02 DIAGNOSIS — E11621 Type 2 diabetes mellitus with foot ulcer: Secondary | ICD-10-CM | POA: Diagnosis not present

## 2018-02-03 DIAGNOSIS — L97519 Non-pressure chronic ulcer of other part of right foot with unspecified severity: Secondary | ICD-10-CM | POA: Diagnosis not present

## 2018-02-03 DIAGNOSIS — E11621 Type 2 diabetes mellitus with foot ulcer: Secondary | ICD-10-CM | POA: Diagnosis not present

## 2018-02-04 DIAGNOSIS — E11621 Type 2 diabetes mellitus with foot ulcer: Secondary | ICD-10-CM | POA: Diagnosis not present

## 2018-02-04 DIAGNOSIS — L97519 Non-pressure chronic ulcer of other part of right foot with unspecified severity: Secondary | ICD-10-CM | POA: Diagnosis not present

## 2018-02-05 DIAGNOSIS — Z79891 Long term (current) use of opiate analgesic: Secondary | ICD-10-CM | POA: Diagnosis not present

## 2018-02-05 DIAGNOSIS — E1142 Type 2 diabetes mellitus with diabetic polyneuropathy: Secondary | ICD-10-CM | POA: Diagnosis not present

## 2018-02-05 DIAGNOSIS — E669 Obesity, unspecified: Secondary | ICD-10-CM | POA: Diagnosis not present

## 2018-02-05 DIAGNOSIS — L97519 Non-pressure chronic ulcer of other part of right foot with unspecified severity: Secondary | ICD-10-CM | POA: Diagnosis not present

## 2018-02-05 DIAGNOSIS — Z9181 History of falling: Secondary | ICD-10-CM | POA: Diagnosis not present

## 2018-02-05 DIAGNOSIS — E11621 Type 2 diabetes mellitus with foot ulcer: Secondary | ICD-10-CM | POA: Diagnosis not present

## 2018-02-05 DIAGNOSIS — L97514 Non-pressure chronic ulcer of other part of right foot with necrosis of bone: Secondary | ICD-10-CM | POA: Diagnosis not present

## 2018-02-05 DIAGNOSIS — E1169 Type 2 diabetes mellitus with other specified complication: Secondary | ICD-10-CM | POA: Diagnosis not present

## 2018-02-05 DIAGNOSIS — Z6835 Body mass index (BMI) 35.0-35.9, adult: Secondary | ICD-10-CM | POA: Diagnosis not present

## 2018-02-05 DIAGNOSIS — G4733 Obstructive sleep apnea (adult) (pediatric): Secondary | ICD-10-CM | POA: Diagnosis not present

## 2018-02-05 DIAGNOSIS — M86371 Chronic multifocal osteomyelitis, right ankle and foot: Secondary | ICD-10-CM | POA: Diagnosis not present

## 2018-02-06 DIAGNOSIS — L97519 Non-pressure chronic ulcer of other part of right foot with unspecified severity: Secondary | ICD-10-CM | POA: Diagnosis not present

## 2018-02-06 DIAGNOSIS — E11621 Type 2 diabetes mellitus with foot ulcer: Secondary | ICD-10-CM | POA: Diagnosis not present

## 2018-02-07 DIAGNOSIS — L97519 Non-pressure chronic ulcer of other part of right foot with unspecified severity: Secondary | ICD-10-CM | POA: Diagnosis not present

## 2018-02-07 DIAGNOSIS — Z6835 Body mass index (BMI) 35.0-35.9, adult: Secondary | ICD-10-CM | POA: Diagnosis not present

## 2018-02-07 DIAGNOSIS — E669 Obesity, unspecified: Secondary | ICD-10-CM | POA: Diagnosis not present

## 2018-02-07 DIAGNOSIS — Z9181 History of falling: Secondary | ICD-10-CM | POA: Diagnosis not present

## 2018-02-07 DIAGNOSIS — G4733 Obstructive sleep apnea (adult) (pediatric): Secondary | ICD-10-CM | POA: Diagnosis not present

## 2018-02-07 DIAGNOSIS — E1142 Type 2 diabetes mellitus with diabetic polyneuropathy: Secondary | ICD-10-CM | POA: Diagnosis not present

## 2018-02-07 DIAGNOSIS — L97514 Non-pressure chronic ulcer of other part of right foot with necrosis of bone: Secondary | ICD-10-CM | POA: Diagnosis not present

## 2018-02-07 DIAGNOSIS — Z79891 Long term (current) use of opiate analgesic: Secondary | ICD-10-CM | POA: Diagnosis not present

## 2018-02-07 DIAGNOSIS — M86371 Chronic multifocal osteomyelitis, right ankle and foot: Secondary | ICD-10-CM | POA: Diagnosis not present

## 2018-02-07 DIAGNOSIS — E11621 Type 2 diabetes mellitus with foot ulcer: Secondary | ICD-10-CM | POA: Diagnosis not present

## 2018-02-07 DIAGNOSIS — E1169 Type 2 diabetes mellitus with other specified complication: Secondary | ICD-10-CM | POA: Diagnosis not present

## 2018-02-09 DIAGNOSIS — G4733 Obstructive sleep apnea (adult) (pediatric): Secondary | ICD-10-CM | POA: Diagnosis not present

## 2018-02-09 DIAGNOSIS — Z9181 History of falling: Secondary | ICD-10-CM | POA: Diagnosis not present

## 2018-02-09 DIAGNOSIS — L97514 Non-pressure chronic ulcer of other part of right foot with necrosis of bone: Secondary | ICD-10-CM | POA: Diagnosis not present

## 2018-02-09 DIAGNOSIS — Z6835 Body mass index (BMI) 35.0-35.9, adult: Secondary | ICD-10-CM | POA: Diagnosis not present

## 2018-02-09 DIAGNOSIS — Z79891 Long term (current) use of opiate analgesic: Secondary | ICD-10-CM | POA: Diagnosis not present

## 2018-02-09 DIAGNOSIS — E11621 Type 2 diabetes mellitus with foot ulcer: Secondary | ICD-10-CM | POA: Diagnosis not present

## 2018-02-09 DIAGNOSIS — E669 Obesity, unspecified: Secondary | ICD-10-CM | POA: Diagnosis not present

## 2018-02-09 DIAGNOSIS — E1142 Type 2 diabetes mellitus with diabetic polyneuropathy: Secondary | ICD-10-CM | POA: Diagnosis not present

## 2018-02-09 DIAGNOSIS — M86371 Chronic multifocal osteomyelitis, right ankle and foot: Secondary | ICD-10-CM | POA: Diagnosis not present

## 2018-02-09 DIAGNOSIS — E1169 Type 2 diabetes mellitus with other specified complication: Secondary | ICD-10-CM | POA: Diagnosis not present

## 2018-02-12 DIAGNOSIS — Z79891 Long term (current) use of opiate analgesic: Secondary | ICD-10-CM | POA: Diagnosis not present

## 2018-02-12 DIAGNOSIS — E669 Obesity, unspecified: Secondary | ICD-10-CM | POA: Diagnosis not present

## 2018-02-12 DIAGNOSIS — M86371 Chronic multifocal osteomyelitis, right ankle and foot: Secondary | ICD-10-CM | POA: Diagnosis not present

## 2018-02-12 DIAGNOSIS — L97514 Non-pressure chronic ulcer of other part of right foot with necrosis of bone: Secondary | ICD-10-CM | POA: Diagnosis not present

## 2018-02-12 DIAGNOSIS — E11621 Type 2 diabetes mellitus with foot ulcer: Secondary | ICD-10-CM | POA: Diagnosis not present

## 2018-02-12 DIAGNOSIS — E1169 Type 2 diabetes mellitus with other specified complication: Secondary | ICD-10-CM | POA: Diagnosis not present

## 2018-02-12 DIAGNOSIS — Z9181 History of falling: Secondary | ICD-10-CM | POA: Diagnosis not present

## 2018-02-12 DIAGNOSIS — E1142 Type 2 diabetes mellitus with diabetic polyneuropathy: Secondary | ICD-10-CM | POA: Diagnosis not present

## 2018-02-12 DIAGNOSIS — Z6835 Body mass index (BMI) 35.0-35.9, adult: Secondary | ICD-10-CM | POA: Diagnosis not present

## 2018-02-12 DIAGNOSIS — G4733 Obstructive sleep apnea (adult) (pediatric): Secondary | ICD-10-CM | POA: Diagnosis not present

## 2018-02-13 DIAGNOSIS — L03115 Cellulitis of right lower limb: Secondary | ICD-10-CM | POA: Diagnosis not present

## 2018-02-14 DIAGNOSIS — T8789 Other complications of amputation stump: Secondary | ICD-10-CM | POA: Diagnosis not present

## 2018-02-14 DIAGNOSIS — E669 Obesity, unspecified: Secondary | ICD-10-CM | POA: Diagnosis not present

## 2018-02-14 DIAGNOSIS — Z79891 Long term (current) use of opiate analgesic: Secondary | ICD-10-CM | POA: Diagnosis not present

## 2018-02-14 DIAGNOSIS — E1142 Type 2 diabetes mellitus with diabetic polyneuropathy: Secondary | ICD-10-CM | POA: Diagnosis not present

## 2018-02-14 DIAGNOSIS — G4733 Obstructive sleep apnea (adult) (pediatric): Secondary | ICD-10-CM | POA: Diagnosis not present

## 2018-02-14 DIAGNOSIS — Z7982 Long term (current) use of aspirin: Secondary | ICD-10-CM | POA: Diagnosis not present

## 2018-02-14 DIAGNOSIS — Z6835 Body mass index (BMI) 35.0-35.9, adult: Secondary | ICD-10-CM | POA: Diagnosis not present

## 2018-02-14 DIAGNOSIS — Z9181 History of falling: Secondary | ICD-10-CM | POA: Diagnosis not present

## 2018-02-14 DIAGNOSIS — Z993 Dependence on wheelchair: Secondary | ICD-10-CM | POA: Diagnosis not present

## 2018-02-25 ENCOUNTER — Encounter: Payer: Self-pay | Admitting: Internal Medicine

## 2018-02-27 ENCOUNTER — Encounter: Payer: Self-pay | Admitting: Internal Medicine

## 2018-02-28 DIAGNOSIS — E1169 Type 2 diabetes mellitus with other specified complication: Secondary | ICD-10-CM | POA: Diagnosis not present

## 2018-02-28 DIAGNOSIS — E11622 Type 2 diabetes mellitus with other skin ulcer: Secondary | ICD-10-CM | POA: Diagnosis not present

## 2018-02-28 DIAGNOSIS — L97412 Non-pressure chronic ulcer of right heel and midfoot with fat layer exposed: Secondary | ICD-10-CM | POA: Diagnosis not present

## 2018-02-28 DIAGNOSIS — Z6835 Body mass index (BMI) 35.0-35.9, adult: Secondary | ICD-10-CM | POA: Diagnosis not present

## 2018-02-28 DIAGNOSIS — L97424 Non-pressure chronic ulcer of left heel and midfoot with necrosis of bone: Secondary | ICD-10-CM | POA: Diagnosis not present

## 2018-02-28 DIAGNOSIS — E1142 Type 2 diabetes mellitus with diabetic polyneuropathy: Secondary | ICD-10-CM | POA: Diagnosis not present

## 2018-02-28 DIAGNOSIS — L97414 Non-pressure chronic ulcer of right heel and midfoot with necrosis of bone: Secondary | ICD-10-CM | POA: Diagnosis not present

## 2018-02-28 DIAGNOSIS — M869 Osteomyelitis, unspecified: Secondary | ICD-10-CM | POA: Diagnosis not present

## 2018-02-28 DIAGNOSIS — Z89421 Acquired absence of other right toe(s): Secondary | ICD-10-CM | POA: Diagnosis not present

## 2018-02-28 DIAGNOSIS — E11621 Type 2 diabetes mellitus with foot ulcer: Secondary | ICD-10-CM | POA: Diagnosis not present

## 2018-03-07 DIAGNOSIS — S91309A Unspecified open wound, unspecified foot, initial encounter: Secondary | ICD-10-CM | POA: Diagnosis not present

## 2018-03-13 ENCOUNTER — Encounter: Payer: Self-pay | Admitting: Internal Medicine

## 2018-03-13 ENCOUNTER — Telehealth: Payer: Self-pay | Admitting: Internal Medicine

## 2018-03-13 ENCOUNTER — Other Ambulatory Visit: Payer: Self-pay | Admitting: Internal Medicine

## 2018-03-13 ENCOUNTER — Encounter: Payer: Self-pay | Admitting: Cardiovascular Disease

## 2018-03-13 DIAGNOSIS — R300 Dysuria: Secondary | ICD-10-CM

## 2018-03-13 DIAGNOSIS — E1142 Type 2 diabetes mellitus with diabetic polyneuropathy: Secondary | ICD-10-CM

## 2018-03-13 NOTE — Telephone Encounter (Signed)
He should not wait a week to have a urinary tract infection treated.  If he is able to come by the office for labs and urinalysis I will order them now.   I cannot order the POCT UA as a future,  But it needs to be done bu the labs as well.    If he cannot come in for labs I recommend he go to Urgent Care for the urinary symptoms

## 2018-03-13 NOTE — Telephone Encounter (Signed)
Patient says he cannot come in to day for lab no transportation will come in tomorrow advised needs to be seen for urinary symptoms at [facility] but no transportation . He can however come in tomorrow afternoon for lab after 1 advised he could eat a light early breakfast  And wait to  have lunch after labs. Advised symptoms worsen he should be evaluated ASAP. Fixed all labs to future and added POCT UA as future.

## 2018-03-13 NOTE — Telephone Encounter (Signed)
thanks

## 2018-03-13 NOTE — Telephone Encounter (Signed)
Patient requesting appointment for Diabetes' follow up and for intermittent dysuria says it stings some with urination  but not every time he urinates. Stated he has a HX of prostatitis advised he should call his urologist but [place]rologist has [place] I have scheduled for 03/20/18 at 4.

## 2018-03-14 ENCOUNTER — Other Ambulatory Visit: Payer: BLUE CROSS/BLUE SHIELD

## 2018-03-15 ENCOUNTER — Other Ambulatory Visit (INDEPENDENT_AMBULATORY_CARE_PROVIDER_SITE_OTHER): Payer: BLUE CROSS/BLUE SHIELD

## 2018-03-15 DIAGNOSIS — E1142 Type 2 diabetes mellitus with diabetic polyneuropathy: Secondary | ICD-10-CM | POA: Diagnosis not present

## 2018-03-15 DIAGNOSIS — R748 Abnormal levels of other serum enzymes: Secondary | ICD-10-CM | POA: Diagnosis not present

## 2018-03-15 DIAGNOSIS — E1149 Type 2 diabetes mellitus with other diabetic neurological complication: Secondary | ICD-10-CM

## 2018-03-15 DIAGNOSIS — R300 Dysuria: Secondary | ICD-10-CM

## 2018-03-15 LAB — URINALYSIS, ROUTINE W REFLEX MICROSCOPIC
Hgb urine dipstick: NEGATIVE
Ketones, ur: NEGATIVE
Leukocytes, UA: NEGATIVE
Nitrite: NEGATIVE
RBC / HPF: NONE SEEN (ref 0–?)
Specific Gravity, Urine: >=1.03 — AB (ref 1.000–1.030)
Total Protein, Urine: 30 — AB
Urine Glucose: NEGATIVE
Urobilinogen, UA: 0.2 (ref 0.0–1.0)
pH: 5.5 (ref 5.0–8.0)

## 2018-03-15 NOTE — Addendum Note (Signed)
Addended by: Arby Barrette on: 03/15/2018 11:13 AM   Modules accepted: Orders

## 2018-03-16 LAB — URINE CULTURE
MICRO NUMBER:: 90852015
SPECIMEN QUALITY:: ADEQUATE

## 2018-03-16 LAB — LIPID PANEL

## 2018-03-20 ENCOUNTER — Ambulatory Visit: Payer: BLUE CROSS/BLUE SHIELD | Admitting: Internal Medicine

## 2018-03-20 ENCOUNTER — Encounter: Payer: Self-pay | Admitting: Internal Medicine

## 2018-03-20 VITALS — BP 138/74 | HR 88 | Temp 98.0°F | Resp 16 | Ht 70.0 in | Wt 248.0 lb

## 2018-03-20 DIAGNOSIS — E1149 Type 2 diabetes mellitus with other diabetic neurological complication: Secondary | ICD-10-CM | POA: Diagnosis not present

## 2018-03-20 DIAGNOSIS — E669 Obesity, unspecified: Secondary | ICD-10-CM | POA: Diagnosis not present

## 2018-03-20 DIAGNOSIS — R300 Dysuria: Secondary | ICD-10-CM

## 2018-03-20 DIAGNOSIS — G5793 Unspecified mononeuropathy of bilateral lower limbs: Secondary | ICD-10-CM

## 2018-03-20 DIAGNOSIS — R748 Abnormal levels of other serum enzymes: Secondary | ICD-10-CM

## 2018-03-20 DIAGNOSIS — D582 Other hemoglobinopathies: Secondary | ICD-10-CM | POA: Diagnosis not present

## 2018-03-20 DIAGNOSIS — F329 Major depressive disorder, single episode, unspecified: Secondary | ICD-10-CM

## 2018-03-20 LAB — HEPATIC FUNCTION PANEL
AG Ratio: 1.2 (calc) (ref 1.0–2.5)
ALT: 47 U/L — ABNORMAL HIGH (ref 9–46)
AST: 42 U/L — ABNORMAL HIGH (ref 10–35)
Albumin: 4.1 g/dL (ref 3.6–5.1)
Alkaline phosphatase (APISO): 44 U/L (ref 40–115)
Bilirubin, Direct: 0.1 mg/dL (ref 0.0–0.2)
Globulin: 3.3 g/dL (calc) (ref 1.9–3.7)
Indirect Bilirubin: 0.5 mg/dL (calc) (ref 0.2–1.2)
Total Bilirubin: 0.6 mg/dL (ref 0.2–1.2)
Total Protein: 7.4 g/dL (ref 6.1–8.1)

## 2018-03-20 LAB — LIPID PANEL
Cholesterol: 169 mg/dL (ref <200)
HDL: 38 mg/dL — ABNORMAL LOW (ref >40)
LDL Cholesterol (Calc): 106 mg/dL (calc) — ABNORMAL HIGH
Non-HDL Cholesterol (Calc): 131 mg/dL (calc) — ABNORMAL HIGH (ref <130)
Total CHOL/HDL Ratio: 4.4 (calc) (ref <5.0)
Triglycerides: 140 mg/dL (ref <150)

## 2018-03-20 LAB — POCT URINALYSIS DIPSTICK
Bilirubin, UA: NEGATIVE
Blood, UA: NEGATIVE
Glucose, UA: POSITIVE — AB
Ketones, UA: POSITIVE
Leukocytes, UA: NEGATIVE
Nitrite, UA: NEGATIVE
Protein, UA: NEGATIVE
Spec Grav, UA: >=1.03 — AB (ref 1.010–1.025)
Urobilinogen, UA: 0.2 E.U./dL
pH, UA: 5.5 (ref 5.0–8.0)

## 2018-03-20 LAB — COMPREHENSIVE METABOLIC PANEL
AG Ratio: 1.2 (calc) (ref 1.0–2.5)
ALT: 47 U/L — ABNORMAL HIGH (ref 9–46)
AST: 42 U/L — ABNORMAL HIGH (ref 10–35)
Albumin: 4.1 g/dL (ref 3.6–5.1)
Alkaline phosphatase (APISO): 44 U/L (ref 40–115)
BUN: 14 mg/dL (ref 7–25)
CO2: 21 mmol/L (ref 20–32)
Calcium: 9 mg/dL (ref 8.6–10.3)
Chloride: 104 mmol/L (ref 98–110)
Creat: 0.8 mg/dL (ref 0.70–1.33)
Globulin: 3.3 g/dL (calc) (ref 1.9–3.7)
Glucose, Bld: 142 mg/dL — ABNORMAL HIGH (ref 65–99)
Potassium: 4.1 mmol/L (ref 3.5–5.3)
Sodium: 137 mmol/L (ref 135–146)
Total Bilirubin: 0.6 mg/dL (ref 0.2–1.2)
Total Protein: 7.4 g/dL (ref 6.1–8.1)

## 2018-03-20 LAB — HEMOGLOBIN A1C
Hgb A1c MFr Bld: 7.1 % of total Hgb — ABNORMAL HIGH (ref <5.7)
Mean Plasma Glucose: 157 (calc)
eAG (mmol/L): 8.7 (calc)

## 2018-03-20 LAB — FERRITIN: Ferritin: 149 ng/mL (ref 38–380)

## 2018-03-20 LAB — HEPATITIS C ANTIBODY
Hepatitis C Ab: NONREACTIVE
SIGNAL TO CUT-OFF: 0.02 (ref <1.00)

## 2018-03-20 LAB — ANA: Anti Nuclear Antibody(ANA): NEGATIVE

## 2018-03-20 LAB — MITOCHONDRIAL ANTIBODIES: Mitochondrial M2 Ab, IgG: <=20 U

## 2018-03-20 LAB — HEPATITIS B SURFACE ANTIGEN: Hepatitis B Surface Ag: NONREACTIVE

## 2018-03-20 LAB — HEPATITIS B CORE ANTIBODY, TOTAL: Hep B Core Total Ab: NONREACTIVE

## 2018-03-20 LAB — HEPATITIS B SURFACE ANTIBODY,QUALITATIVE: Hep B S Ab: NONREACTIVE

## 2018-03-20 LAB — ANTI-SMITH ANTIBODY: ENA SM Ab Ser-aCnc: <1 AI

## 2018-03-20 LAB — PSA: PSA: 0.2 ng/mL (ref <=4.0)

## 2018-03-20 MED ORDER — METFORMIN HCL 500 MG PO TABS: 500.0000 mg | ORAL_TABLET | Freq: Two times a day (BID) | ORAL | 3 refills | Status: DC

## 2018-03-20 MED ORDER — PREGABALIN 75 MG PO CAPS: 75.0000 mg | ORAL_CAPSULE | Freq: Three times a day (TID) | ORAL | 2 refills | Status: DC

## 2018-03-20 NOTE — Progress Notes (Signed)
Subjective:  Patient ID: [name], male    DOB: 07-Oct-1965  Age: 52 y.o. MRN: [id number]  CC: The primary encounter diagnosis was Dysuria. Diagnoses of Obesity (BMI 30-39.9), Abnormal hemoglobin ([place]), Type 2 diabetes mellitus with neurological complications ([place]), Elevated liver enzymes, Reactive depression, and Neuropathy of both feet were also pertinent to this visit.  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  Has right plantar ulcer and left great toe.  Insurance denied wound Vac so he has been instructed to use iodoform packing and iodoflex.  No progression of osteomyelitis was found on most recent  X rays.  Wounds were debrided.   New wound on first left MPJ plantar surface.  wearing tennis shoes.  Using a scooter but not on it today,  Not using crutches as an alternative. ,.    Patient frustrated at his continued disability and lack of healing. He doesn't trust his surgeon Janus Molder because he is not getting any resolution of ulcers. Long discussion about his compliance issues.  (45 minutes spent with patient today identifying the barriers to his healing being primarily his noncompliance with offloading instructions.    Intermittent urinary symptoms ( distal urethral pain, bladder pain  Without frequency , blood  Or odor )   Did not tolerate trial of wellbutrin with concurrent lyrica .  (made him too loopy)   Has episodes of severe pain right lateral foot and between 4th and 5th toe despite  Use of lyrica and motrin maximal doses .   Weight gain of 8 lbs since last visit. Difficult to exercise due to diabetic foot ulcers.      Lab Results  Component Value Date   HGBA1C 7.1 (H) 03/15/2018   Tries to follow a low GI diet but wife buys  muffins ,  Trying to use protein drinks.  Supper is high carb bc it is convenient and cheaper.     Discussed liver enzymes. Discussed gettign ultrasound vs GI liver .   Has gained 8 lbs.    Outpatient Medications Prior to Visit  Medication Sig Dispense Refill  . Multiple Vitamins-Minerals (MULTIVITAMIN WITH MINERALS) tablet Take 1 tablet by mouth daily.    Marland Kitchen LYRICA 75 MG capsule TAKE 1 CAPSULE BY MOUTH AT BEDTIME FOR NERVE PAIN 30 capsule 1  . acyclovir (ZOVIRAX) 400 MG tablet Take 1 tablet by mouth as needed.  0  . Ascorbic Acid (VITAMIN C) 100 MG tablet Take 500 mg by mouth daily.    Marland Kitchen buPROPion (WELLBUTRIN XL) 150 MG 24 hr tablet Take 1 tablet (150 mg total) by mouth daily. (Patient not taking: Reported on 03/20/2018) 30 tablet 2  . oxycodone (OXY-IR) 5 MG capsule Take 5 mg by mouth 4 (four) times daily as needed.    . traMADol (ULTRAM) 50 MG tablet Take 1 tablet (50 mg total) by mouth every 8 (eight) hours as needed. (Patient not taking: Reported on 10/09/2017) 90 tablet 2   No facility-administered medications prior to visit.     Review of Systems;  Patient denies headache, fevers, malaise, unintentional weight loss, skin rash, eye pain, sinus congestion and sinus pain, sore throat, dysphagia,  hemoptysis , cough, dyspnea, wheezing, chest pain, palpitations, orthopnea, edema, abdominal pain, nausea, melena,  diarrhea, constipation, flank pain, dysuria, hematuria, urinary  Frequency, nocturia, numbness, tingling, seizures,  Focal weakness, Loss of consciousness,  Tremor, insomnia, depression, anxiety, and suicidal ideation.      Objective:  BP 138/74 (BP Location: Left Arm, Patient Position: Sitting, Cuff Size: Normal)   Pulse 88   Temp 98 F (36.7 C) (Oral)   Resp 16   Ht 5\' 10"  (1.778 m)   Wt 248 lb (112.5 kg)   SpO2 94%   BMI 35.58 kg/m   BP Readings from Last 3 Encounters:  03/20/18 138/74  10/09/17 118/70  10/05/16 122/80    Wt Readings from Last 3 Encounters:    03/20/18 248 lb (112.5 kg)  10/09/17 248 lb 9.6 oz (112.8 kg)  10/05/16 240 lb (108.9 kg)    General appearance: alert, cooperative and appears stated age Ears: normal TM's and external ear canals both ears Throat: lips, mucosa, and tongue normal; teeth and gums normal Neck: no adenopathy, no carotid bruit, supple, symmetrical, trachea midline and thyroid not enlarged, symmetric, no tenderness/mass/nodules Back: symmetric, no curvature. ROM normal. No CVA tenderness. Lungs: clear to auscultation bilaterally Heart: regular rate and rhythm, S1, S2 normal, no murmur, click, rub or gallop Abdomen: soft, non-tender; bowel sounds normal; no masses,  no organomegaly Pulses: 2+ and symmetric Skin: Skin color, texture, turgor normal. No rashes or lesions Lymph nodes: Cervical, supraclavicular, and axillary nodes normal.  Lab Results  Component Value Date   HGBA1C 7.1 (H) 03/15/2018   HGBA1C 6/5 12/08/2017   HGBA1C 6.6 10/09/2017    Lab Results  Component Value Date   CREATININE 0.80 03/15/2018   CREATININE 0.86 10/09/2017   CREATININE 0.8 04/08/2017    Lab Results  Component Value Date   WBC 7.6 04/08/2017   HGB 13.4 (A) 04/08/2017   HCT 40 (A) 04/08/2017   PLT 532 (A) 04/08/2017   GLUCOSE 142 (H) 03/15/2018   CHOL 169 03/15/2018   TRIG 140 03/15/2018   TRIG CANCELED 03/15/2018   HDL 38 (L) 03/15/2018   LDLDIRECT 109.0 10/09/2017   LDLCALC 106 (H) 03/15/2018   LDLCALC CANCELED 03/15/2018   ALT 47 (H) 03/15/2018   ALT 47 (H) 03/15/2018   AST 42 (H) 03/15/2018   AST 42 (H) 03/15/2018   NA 137 03/15/2018   K 4.1 03/15/2018   CL 104 03/15/2018   CREATININE 0.80 03/15/2018   BUN 14 03/15/2018   CO2 21 03/15/2018   TSH 0.78 12/04/2015   PSA 0.2 03/15/2018   INR 1.3 (A) 04/08/2017   HGBA1C 7.1 (H) 03/15/2018   MICROALBUR 3.1 (H) 10/09/2017    No results found.  Assessment & Plan:   Problem List Items Addressed This Visit    Obesity (BMI 30-39.9)    Secondary to  inactivity and poor diet. Both addressed today. .  Metformin prescribed.       Relevant Medications   metFORMIN (GLUCOPHAGE) 500 MG tablet   Other Relevant Orders   US Abdomen Limited RUQ   Neuropathy of both feet    lyrica dose to be taken consistently with dose gradually increased as needed to tid.       Relevant Medications   pregabalin (LYRICA) 75 MG capsule   Elevated liver enzymes    .screening for autoimmune causes negative by serologies.ultrasound of liver ordered to eval for fatty liver.       Relevant Orders   US Abdomen Limited RUQ   Depression    He lacks motivation,  Has spent the  last two years unable to work but has not spent the time wisely,  And now feels frustrated .  Encouraged to resume  wellbutrin,  Discuss with family the barriers that he has to being more mobile , and stop wasint his entire day watching TV but working on increasing   his upper body strength and sharpening his mind, consider taking  online courses.        Abnormal hemoglobin (HCC)    Other Visit Diagnoses    Dysuria    -  Primary   Relevant Orders   POCT Urinalysis Dipstick (Completed)   Urine Culture (Completed)   Urine Microscopic Only (Completed)   Type 2 diabetes mellitus with neurological complications (HCC)       Relevant Medications   metFORMIN (GLUCOPHAGE) 500 MG tablet   Other Relevant Orders   US Abdomen Limited RUQ      I have discontinued Collyn C. Aycock's traMADol, oxycodone, and buPROPion. I have also changed his LYRICA to pregabalin. Additionally, I am having him start on metFORMIN. Lastly, I am having him maintain his multivitamin with minerals, vitamin C, and acyclovir.  Meds ordered this encounter  Medications  . pregabalin (LYRICA) 75 MG capsule    Sig: Take 1 capsule (75 mg total) by mouth 3 (three) times daily.    Dispense:  90 capsule    Refill:  2    Not to exceed 4 additional fills before 02/27/2018  . metFORMIN (GLUCOPHAGE) 500 MG tablet    Sig: Take 1  tablet (500 mg total) by mouth 2 (two) times daily with a meal.    Dispense:  60 tablet    Refill:  3    Medications Discontinued During This Encounter  Medication Reason  . buPROPion (WELLBUTRIN XL) 150 MG 24 hr tablet Patient has not taken in last 30 days  . oxycodone (OXY-IR) 5 MG capsule Patient has not taken in last 30 days  . traMADol (ULTRAM) 50 MG tablet Patient has not taken in last 30 days  . LYRICA 75 MG capsule     Follow-up: No follow-ups on file.   Crecencio Mc, MD

## 2018-03-20 NOTE — Patient Instructions (Addendum)
Resume lyrica once daily for a week,  Add second dose either during the day or at night if needed.    After two weeks of twice daily use , if you need to you can add a 3rd dose if needed at bedtime (150 mg is the maximal  bedtime dose) or during the day   Trial of metformin start with one tablet daily for a week or until until the loose stools resolve,  Then increase to twice daily   Ultrasound of liver to be ordered to evaluate for fatty liver

## 2018-03-21 DIAGNOSIS — R748 Abnormal levels of other serum enzymes: Secondary | ICD-10-CM | POA: Insufficient documentation

## 2018-03-21 LAB — URINALYSIS, MICROSCOPIC ONLY
RBC / HPF: NONE SEEN (ref 0–?)
WBC, UA: NONE SEEN (ref 0–?)

## 2018-03-21 LAB — URINE CULTURE
MICRO NUMBER:: 90870367
SPECIMEN QUALITY:: ADEQUATE

## 2018-03-21 NOTE — Assessment & Plan Note (Signed)
Secondary to inactivity and poor diet. Both addressed today. .  Metformin prescribed.

## 2018-03-21 NOTE — Assessment & Plan Note (Addendum)
.  screening for autoimmune causes negative by serologies.ultrasound of liver ordered to eval for fatty liver.

## 2018-03-21 NOTE — Assessment & Plan Note (Signed)
lyrica dose to be taken consistently with dose gradually increased as needed to tid.

## 2018-03-21 NOTE — Assessment & Plan Note (Signed)
Starting metformin at 500 mg daily.  Increase to twice daily if tolerated.   Lab Results  Component Value Date   HGBA1C 7.1 (H) 03/15/2018

## 2018-03-21 NOTE — Assessment & Plan Note (Addendum)
He lacks motivation,  Has spent the last two years unable to work but has not spent the time wisely,  And now feels frustrated .  Encouraged to resume  wellbutrin,  Discuss with family the barriers that he has to being more mobile , and stop wasint his entire day watching TV but working on increasing   his upper body strength and sharpening his mind, consider taking  online courses.

## 2018-03-27 ENCOUNTER — Ambulatory Visit: Payer: BLUE CROSS/BLUE SHIELD

## 2018-04-12 DIAGNOSIS — Z89431 Acquired absence of right foot: Secondary | ICD-10-CM | POA: Diagnosis not present

## 2018-04-12 DIAGNOSIS — L97412 Non-pressure chronic ulcer of right heel and midfoot with fat layer exposed: Secondary | ICD-10-CM | POA: Diagnosis not present

## 2018-04-12 DIAGNOSIS — E11621 Type 2 diabetes mellitus with foot ulcer: Secondary | ICD-10-CM | POA: Diagnosis not present

## 2018-04-14 NOTE — Progress Notes (Deleted)
Cardiology Office Note  Date:  04/14/2018   ID:  [name], DOB [birth year], MRN [id number]  PCP:  Crecencio Mc, MD   No chief complaint on file.   HPI:  [name] is a 52 year old gentleman with history of  sleep apnea, not on CPAP, Diabetes II osteomyelitis chest pain symptoms    He reports that he is doing well, denies any significant chest pain, shoulder pain He has not been working out recently Was previously working out with his son Weight stable but elevated  Lab work reviewed with him, total cholesterol around 147 on no medication Sugar borderline elevated  He does report a strong family history of coronary artery disease. Dad (smoker) and grandfather (nonsmoker?)  had MI and CVA He is a nonsmoker, nondiabetic.   EKG on today's visit shows normal sinus rhythm with rate 80 bpm, no significant ST or T-wave changes  Other past medical history He reports that he does not use a CPAP as he did not want to go back and do part two of the study. He had a terrible night on part 1.   PMH:   has a past medical history of Allergy, Depression, and Sleep apnea.  PSH:    Past Surgical History:  Procedure Laterality Date  . FOOT SURGERY Right 02/14   cyst removed  . 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 TOE AMPUTATION Right 06/30/2016   5 toes    Current Outpatient Medications  Medication Sig Dispense Refill  . acyclovir (ZOVIRAX) 400 MG tablet Take 1 tablet by mouth as needed.  0  . Ascorbic Acid (VITAMIN C) 100 MG tablet Take 500 mg by mouth daily.    . metFORMIN (GLUCOPHAGE) 500 MG tablet Take 1 tablet (500 mg total) by mouth 2 (two) times daily with a meal. 60 tablet 3  . Multiple Vitamins-Minerals (MULTIVITAMIN WITH MINERALS) tablet Take 1 tablet by mouth daily.    . pregabalin (LYRICA) 75 MG capsule Take 1 capsule (75 mg total) by mouth 3 (three) times daily. 90 capsule 2   No current facility-administered medications for this visit.       Allergies:   Vancomycin   Social History:  The patient  reports that he has never smoked. He has never used smokeless tobacco. He reports that he does not drink alcohol or use drugs.   Family History:   family history includes Heart attack (age of onset: 77) in his father; Heart disease in his father, maternal grandmother, and paternal grandfather; Stroke in his father, maternal grandmother, and paternal grandfather.    Review of Systems: Review of Systems  Constitutional: Negative.   Respiratory: Negative.   Cardiovascular: Negative.   Gastrointestinal: Negative.   Musculoskeletal: Negative.   Neurological: Negative.   Psychiatric/Behavioral: Negative.   All other systems reviewed and are negative.    PHYSICAL EXAM: VS:  There were no vitals taken for this visit. , BMI There is no height or weight on file to calculate BMI. GEN: Well nourished, well developed, in no acute distress, obese HEENT: normal Neck: no JVD, carotid bruits, or masses Cardiac: RRR; no murmurs, rubs, or gallops,no edema  Respiratory:  clear to auscultation bilaterally, normal work of breathing GI: soft, nontender, nondistended, + BS MS: no deformity or atrophy Skin: warm and dry, no rash Neuro:  Strength and sensation are intact Psych: euthymic mood, full affect    Recent Labs: 03/15/2018: ALT 47; ALT 47; BUN 14; Creat 0.80;  Potassium 4.1; Sodium 137    Lipid Panel Lab Results  Component Value Date   CHOL 169 03/15/2018   HDL 38 (L) 03/15/2018   LDLCALC 106 (H) 03/15/2018   LDLCALC CANCELED 03/15/2018   TRIG 140 03/15/2018   TRIG CANCELED 03/15/2018      Wt Readings from Last 3 Encounters:  03/20/18 248 lb (112.5 kg)  10/09/17 248 lb 9.6 oz (112.8 kg)  10/05/16 240 lb (108.9 kg)       ASSESSMENT AND PLAN:  Other chest pain - Plan: EKG 12-Lead Denies any chest pain symptoms No further workup needed at this time  OSA (obstructive sleep apnea) He did not complete part 2 of  his sleep study. Does not want to go back  Obesity (BMI 30-39.9)  long discussion concerning diet, restarting his exercise program   recommended low carbohydrates  Family history   father was a smoker Patient does not have any significant risk factors We did discuss CT coronary calcium scoring again   Disposition:   F/U  12 month as needed    No orders of the defined types were placed in this encounter.    Signed, Esmond Plants, M.D., Ph.D. 04/14/2018  [place], [place]

## 2018-04-16 ENCOUNTER — Ambulatory Visit: Payer: BLUE CROSS/BLUE SHIELD | Admitting: Cardiovascular Disease

## 2018-04-18 DIAGNOSIS — L97412 Non-pressure chronic ulcer of right heel and midfoot with fat layer exposed: Secondary | ICD-10-CM | POA: Diagnosis not present

## 2018-04-18 DIAGNOSIS — E11621 Type 2 diabetes mellitus with foot ulcer: Secondary | ICD-10-CM | POA: Diagnosis not present

## 2018-04-18 DIAGNOSIS — S91309A Unspecified open wound, unspecified foot, initial encounter: Secondary | ICD-10-CM | POA: Diagnosis not present

## 2018-04-23 DIAGNOSIS — E11621 Type 2 diabetes mellitus with foot ulcer: Secondary | ICD-10-CM | POA: Diagnosis not present

## 2018-04-23 DIAGNOSIS — L97412 Non-pressure chronic ulcer of right heel and midfoot with fat layer exposed: Secondary | ICD-10-CM | POA: Diagnosis not present

## 2018-04-23 DIAGNOSIS — E1140 Type 2 diabetes mellitus with diabetic neuropathy, unspecified: Secondary | ICD-10-CM | POA: Diagnosis not present

## 2018-04-23 DIAGNOSIS — Z89421 Acquired absence of other right toe(s): Secondary | ICD-10-CM | POA: Diagnosis not present

## 2018-04-26 DIAGNOSIS — L97412 Non-pressure chronic ulcer of right heel and midfoot with fat layer exposed: Secondary | ICD-10-CM | POA: Diagnosis not present

## 2018-04-26 DIAGNOSIS — E11621 Type 2 diabetes mellitus with foot ulcer: Secondary | ICD-10-CM | POA: Diagnosis not present

## 2018-04-26 DIAGNOSIS — Z89431 Acquired absence of right foot: Secondary | ICD-10-CM | POA: Diagnosis not present

## 2018-04-26 DIAGNOSIS — M86171 Other acute osteomyelitis, right ankle and foot: Secondary | ICD-10-CM | POA: Diagnosis not present

## 2018-05-14 NOTE — Progress Notes (Signed)
Cardiology Office Note  Date:  05/16/2018   ID:  [name], DOB May 05, 1966, MRN [id number]  PCP:  Crecencio Mc, MD   Chief Complaint  Patient presents with  . other    12 mo follow up.Medications verbally reviewed.     HPI:  [name] is a 52 year old gentleman with history of  sleep apnea, not on CPAP, Diabetes II osteomyelitis chest pain symptoms Who presents for follow-up of his cardiac risk factors, prior chest pain symptoms   difficult Year or 2 for him Osteo of right foot, several surgeries Amputation of toes Recent MRI, acute osteo still present Seeking second opinion at [place] Using a 4 wheeler to get around, minimize weight bearing  TEE: 04/03/2017 No vegetation  Weight higher over past year, Down in past 2 months, keto diet  LE arterial: no significant disease: 03/2017  EKG personally reviewed by myself on todays visit Shows no sinus rhythm rate 76 bpm no significant ST or T-wave changes   Lab Results  Component Value Date   CHOL 169 03/15/2018   HDL 38 (L) 03/15/2018   LDLCALC 106 (H) 03/15/2018   LDLCALC CANCELED 03/15/2018   TRIG 140 03/15/2018   TRIG CANCELED 03/15/2018   HBA1C 7.1  nonsmoker,   audible pulse by Doppler right lower extremity on today's visit  Other past medical history He reports that he does not use a CPAP as he did not want to go back and do part two of the study. He had a terrible night on part 1.   PMH:   has a past medical history of Allergy, Depression, and Sleep apnea.  PSH:    Past Surgical History:  Procedure Laterality Date  . FOOT SURGERY Right 02/14   cyst removed  . 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 TOE AMPUTATION Right 06/30/2016   5 toes    Current Outpatient Medications  Medication Sig Dispense Refill  . acyclovir (ZOVIRAX) 400 MG tablet Take 1 tablet by mouth as needed.  0  . Ascorbic Acid (VITAMIN C) 100 MG tablet Take 500 mg by mouth daily.    . metFORMIN (GLUCOPHAGE)  500 MG tablet Take 1 tablet (500 mg total) by mouth 2 (two) times daily with a meal. 60 tablet 3  . Multiple Vitamins-Minerals (MULTIVITAMIN WITH MINERALS) tablet Take 1 tablet by mouth daily.    . pregabalin (LYRICA) 75 MG capsule Take 1 capsule (75 mg total) by mouth 3 (three) times daily. 90 capsule 2   No current facility-administered medications for this visit.      Allergies:   Vancomycin   Social History:  The patient  reports that he has never smoked. He has never used smokeless tobacco. He reports that he does not drink alcohol or use drugs.   Family History:   family history includes Heart attack (age of onset: 12) in his father; Heart disease in his father, maternal grandmother, and paternal grandfather; Stroke in his father, maternal grandmother, and paternal grandfather.    Review of Systems: Review of Systems  Constitutional: Negative.   Respiratory: Negative.   Cardiovascular: Negative.   Gastrointestinal: Negative.   Musculoskeletal: Negative.   Neurological: Negative.   Psychiatric/Behavioral: Negative.   All other systems reviewed and are negative.    PHYSICAL EXAM: VS:  BP 122/68 (BP Location: Left Arm, Patient Position: Sitting, Cuff Size: Normal)   Pulse 76   Ht 5\' 10"  (1.778 m)   Wt 236 lb (  107 kg)   BMI 33.86 kg/m  , BMI Body mass index is 33.86 kg/m. Constitutional:  oriented to person, place, and time. No distress.  HENT:  Head: Normocephalic and atraumatic.  Eyes:  no discharge. No scleral icterus.  Neck: Normal range of motion. Neck supple. No JVD present.  Cardiovascular: Normal rate, regular rhythm, normal heart sounds and intact distal pulses. Exam reveals no gallop and no friction rub. No edema No murmur heard. Pulmonary/Chest: Effort normal and breath sounds normal. No stridor. No respiratory distress.  no wheezes.  no rales.  no tenderness.  Abdominal: Soft.  no distension.  no tenderness.  Musculoskeletal: Normal range of motion.  no   tenderness or deformity Toe amputationRight foot.  Neurological:  normal muscle tone. Coordination normal. No atrophy Skin: Skin is warm and dry. No rash noted. not diaphoretic.  Psychiatric:  normal mood and affect. behavior is normal. Thought content normal.     Recent Labs: 03/15/2018: ALT 47; ALT 47; BUN 14; Creat 0.80; Potassium 4.1; Sodium 137    Lipid Panel Lab Results  Component Value Date   CHOL 169 03/15/2018   HDL 38 (L) 03/15/2018   LDLCALC 106 (H) 03/15/2018   LDLCALC CANCELED 03/15/2018   TRIG 140 03/15/2018   TRIG CANCELED 03/15/2018      Wt Readings from Last 3 Encounters:  05/16/18 236 lb (107 kg)  03/20/18 248 lb (112.5 kg)  10/09/17 248 lb 9.6 oz (112.8 kg)       ASSESSMENT AND PLAN:  Other chest pain - Plan: EKG 12-Lead Denies any chest pain symptoms No further screening studies at this time  OSA (obstructive sleep apnea) He did not complete part 2 of his sleep study. Does not want to go back  Osteomyelitis foot Previously seen at [facility] second opinion at [facility] confirming recurrent osteo- dopplerable pulses  Obesity (BMI 30-39.9)  recent 12 pound weight loss following keto diet This should help lipids and hemoglobin A1c  Type 2 diabetes Discussed with him, hemoglobin A1c elevated He has changed his diet, down 12 pounds  Family history   father was a smoker We did discuss CT coronary calcium scoring  We'll hold off for now   Disposition:   F/U as needed   Orders Placed This Encounter  Procedures  . EKG 12-Lead     Signed, Esmond Plants, M.D., Ph.D. 05/16/2018  [place], [place]

## 2018-05-16 ENCOUNTER — Ambulatory Visit: Payer: BLUE CROSS/BLUE SHIELD | Admitting: Cardiovascular Disease

## 2018-05-16 ENCOUNTER — Encounter: Payer: Self-pay | Admitting: Cardiovascular Disease

## 2018-05-16 VITALS — BP 122/68 | HR 76 | Ht 70.0 in | Wt 236.0 lb

## 2018-05-16 DIAGNOSIS — E669 Obesity, unspecified: Secondary | ICD-10-CM

## 2018-05-16 DIAGNOSIS — G4733 Obstructive sleep apnea (adult) (pediatric): Secondary | ICD-10-CM | POA: Diagnosis not present

## 2018-05-16 DIAGNOSIS — E1169 Type 2 diabetes mellitus with other specified complication: Secondary | ICD-10-CM | POA: Diagnosis not present

## 2018-05-16 DIAGNOSIS — R0789 Other chest pain: Secondary | ICD-10-CM

## 2018-05-16 DIAGNOSIS — Z8739 Personal history of other diseases of the musculoskeletal system and connective tissue: Secondary | ICD-10-CM | POA: Diagnosis not present

## 2018-05-16 NOTE — Patient Instructions (Signed)

## 2018-05-21 DIAGNOSIS — M86671 Other chronic osteomyelitis, right ankle and foot: Secondary | ICD-10-CM | POA: Diagnosis not present

## 2018-05-21 DIAGNOSIS — L97512 Non-pressure chronic ulcer of other part of right foot with fat layer exposed: Secondary | ICD-10-CM | POA: Diagnosis not present

## 2018-05-21 DIAGNOSIS — E1140 Type 2 diabetes mellitus with diabetic neuropathy, unspecified: Secondary | ICD-10-CM | POA: Diagnosis not present

## 2018-06-07 DIAGNOSIS — E1140 Type 2 diabetes mellitus with diabetic neuropathy, unspecified: Secondary | ICD-10-CM | POA: Diagnosis not present

## 2018-06-07 DIAGNOSIS — Z89422 Acquired absence of other left toe(s): Secondary | ICD-10-CM | POA: Diagnosis not present

## 2018-06-07 DIAGNOSIS — M86671 Other chronic osteomyelitis, right ankle and foot: Secondary | ICD-10-CM | POA: Diagnosis not present

## 2018-06-07 DIAGNOSIS — L97512 Non-pressure chronic ulcer of other part of right foot with fat layer exposed: Secondary | ICD-10-CM | POA: Diagnosis not present

## 2018-06-11 ENCOUNTER — Other Ambulatory Visit: Payer: Self-pay | Admitting: Internal Medicine

## 2018-06-11 DIAGNOSIS — E669 Obesity, unspecified: Principal | ICD-10-CM

## 2018-06-11 DIAGNOSIS — E1169 Type 2 diabetes mellitus with other specified complication: Secondary | ICD-10-CM

## 2018-06-12 ENCOUNTER — Other Ambulatory Visit (INDEPENDENT_AMBULATORY_CARE_PROVIDER_SITE_OTHER): Payer: BLUE CROSS/BLUE SHIELD

## 2018-06-12 DIAGNOSIS — E669 Obesity, unspecified: Secondary | ICD-10-CM | POA: Diagnosis not present

## 2018-06-12 DIAGNOSIS — E1169 Type 2 diabetes mellitus with other specified complication: Secondary | ICD-10-CM | POA: Diagnosis not present

## 2018-06-12 LAB — LIPID PANEL
Cholesterol: 156 mg/dL (ref 0–200)
HDL: 31.9 mg/dL — ABNORMAL LOW (ref >39.00)
LDL Cholesterol: 102 mg/dL — ABNORMAL HIGH (ref 0–99)
NonHDL: 123.82
Total CHOL/HDL Ratio: 5
Triglycerides: 110 mg/dL (ref 0.0–149.0)
VLDL: 22 mg/dL (ref 0.0–40.0)

## 2018-06-12 LAB — MICROALBUMIN / CREATININE URINE RATIO
Creatinine,U: 110.8 mg/dL
Microalb Creat Ratio: 2.1 mg/g (ref 0.0–30.0)
Microalb, Ur: 2.3 mg/dL — ABNORMAL HIGH (ref 0.0–1.9)

## 2018-06-12 LAB — COMPREHENSIVE METABOLIC PANEL
ALT: 37 U/L (ref 0–53)
AST: 27 U/L (ref 0–37)
Albumin: 4.2 g/dL (ref 3.5–5.2)
Alkaline Phosphatase: 38 U/L — ABNORMAL LOW (ref 39–117)
BUN: 20 mg/dL (ref 6–23)
CO2: 26 mEq/L (ref 19–32)
Calcium: 8.7 mg/dL (ref 8.4–10.5)
Chloride: 105 mEq/L (ref 96–112)
Creatinine, Ser: 0.89 mg/dL (ref 0.40–1.50)
GFR: 94.73 mL/min (ref >60.00)
Glucose, Bld: 104 mg/dL — ABNORMAL HIGH (ref 70–99)
Potassium: 3.9 mEq/L (ref 3.5–5.1)
Sodium: 138 mEq/L (ref 135–145)
Total Bilirubin: 0.6 mg/dL (ref 0.2–1.2)
Total Protein: 7.6 g/dL (ref 6.0–8.3)

## 2018-06-12 LAB — HEMOGLOBIN A1C: Hgb A1c MFr Bld: 5.2 % (ref 4.6–6.5)

## 2018-06-20 ENCOUNTER — Ambulatory Visit: Payer: BLUE CROSS/BLUE SHIELD | Admitting: Internal Medicine

## 2018-06-20 ENCOUNTER — Encounter: Payer: Self-pay | Admitting: Internal Medicine

## 2018-06-20 VITALS — BP 116/58 | HR 83 | Temp 98.1°F | Resp 15 | Ht 70.0 in | Wt 231.8 lb

## 2018-06-20 DIAGNOSIS — Z23 Encounter for immunization: Secondary | ICD-10-CM

## 2018-06-20 DIAGNOSIS — E1169 Type 2 diabetes mellitus with other specified complication: Secondary | ICD-10-CM

## 2018-06-20 DIAGNOSIS — G5793 Unspecified mononeuropathy of bilateral lower limbs: Secondary | ICD-10-CM | POA: Diagnosis not present

## 2018-06-20 DIAGNOSIS — L97512 Non-pressure chronic ulcer of other part of right foot with fat layer exposed: Secondary | ICD-10-CM | POA: Diagnosis not present

## 2018-06-20 DIAGNOSIS — R748 Abnormal levels of other serum enzymes: Secondary | ICD-10-CM

## 2018-06-20 DIAGNOSIS — M86671 Other chronic osteomyelitis, right ankle and foot: Secondary | ICD-10-CM | POA: Diagnosis not present

## 2018-06-20 DIAGNOSIS — F329 Major depressive disorder, single episode, unspecified: Secondary | ICD-10-CM

## 2018-06-20 DIAGNOSIS — E669 Obesity, unspecified: Secondary | ICD-10-CM

## 2018-06-20 NOTE — Progress Notes (Signed)
Subjective:  Patient ID: [name], male    DOB: [birth year]  Age: 52 y.o. MRN: [id number]  CC: The primary encounter diagnosis was Diabetes mellitus type 2 in obese ([place]). 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  HPI [name] presents for 3 month follow up on diabetes.  Patient has no complaints today.  Patient is following a low glycemic index diet and has stopped taking metformin as advised due to recent development of  recurrent hypoglycemia .  Fasting sugars have been  less than 120 most of the time and post prandials have been under 150 except on rare occasions. Patient is not exercising due to nonhealing right diabetic foot ulcer  Complicated by neuropathy. But is losing weight .  He has lost 17 lbs since his last visit in July.  Patient has had an eye exam in the last 12 months Patient does not walk barefoot outside,  And has persistnet numbness tim both feet due to neuropathy . Patient is up to date on all recommended vaccinations  Lab Results  Component Value Date   HGBA1C 5.2 06/12/2018   Diabetic foot ulcers on right foot : more surgery required.  Has decided to seek  2nd opinion at [facility].  Discussed his compliance,/noncompliance with NWB and pressure offloading   Lab Results  Component Value Date   MICROALBUR 2.3 (H) 06/12/2018     Outpatient Medications Prior to Visit  Medication Sig Dispense Refill  . acyclovir (ZOVIRAX) 400 MG tablet Take 1 tablet by mouth as needed.  0  . Ascorbic Acid (VITAMIN C) 100 MG tablet Take 500 mg by mouth daily.    . Multiple Vitamins-Minerals (MULTIVITAMIN WITH MINERALS) tablet Take 1 tablet by mouth daily.    . pregabalin (LYRICA) 75 MG capsule Take 1 capsule (75 mg total) by mouth 3 (three) times daily. 90 capsule 2  .  metFORMIN (GLUCOPHAGE) 500 MG tablet Take 1 tablet (500 mg total) by mouth 2 (two) times daily with a meal. (Patient not taking: Reported on 06/20/2018) 60 tablet 3   No facility-administered medications prior to visit.     Review of Systems;  Patient denies headache, fevers, malaise, unintentional weight loss, skin rash, eye pain, sinus congestion and sinus pain, sore throat, dysphagia,  hemoptysis , cough, dyspnea, wheezing, chest pain, palpitations, orthopnea, edema, abdominal pain, nausea, melena, diarrhea, constipation, flank pain, dysuria, hematuria, urinary  Frequency, nocturia, tingling, seizures,  Focal weakness, Loss of consciousness,  Tremor, insomnia, depression, anxiety, and suicidal ideation.      Objective:  BP (!) 116/58 (BP Location: Left Arm, Patient Position: Sitting, Cuff Size: Normal)   Pulse 83   Temp 98.1 F (36.7 C) (Oral)   Resp 15   Ht 5\' 10"  (1.778 m)   Wt 231 lb 12.8 oz (105.1 kg)   SpO2 94%   BMI 33.26 kg/m   BP Readings from Last 3 Encounters:  06/20/18 (!) 116/58  05/16/18 122/68  03/20/18 138/74    Wt Readings from Last 3 Encounters:  06/20/18 231 lb 12.8 oz (105.1 kg)  05/16/18 236 lb (107 kg)  03/20/18 248 lb (112.5 kg)    General appearance: alert, cooperative and appears stated age.  Not using a scooter, walker or crutch.  Ears: normal TM's and external ear canals both ears Throat:  lips, mucosa, and tongue normal; teeth and gums normal Neck: no adenopathy, no carotid bruit, supple, symmetrical, trachea midline and thyroid not enlarged, symmetric, no tenderness/mass/nodules Back: symmetric, no curvature. ROM normal. No CVA tenderness. Lungs: clear to auscultation bilaterally Heart: regular rate and rhythm, S1, S2 normal, no murmur, click, rub or gallop Abdomen: soft, non-tender; bowel sounds normal; no masses,  no organomegaly Pulses: 2+ and symmetric Skin: Skin color, texture, turgor normal. No rashes or lesions Lymph nodes: Cervical,  supraclavicular, and axillary nodes normal.  Lab Results  Component Value Date   HGBA1C 5.2 06/12/2018   HGBA1C 7.1 (H) 03/15/2018   HGBA1C 6/5 12/08/2017    Lab Results  Component Value Date   CREATININE 0.89 06/12/2018   CREATININE 0.80 03/15/2018   CREATININE 0.86 10/09/2017    Lab Results  Component Value Date   WBC 7.6 04/08/2017   HGB 13.4 (A) 04/08/2017   HCT 40 (A) 04/08/2017   PLT 532 (A) 04/08/2017   GLUCOSE 104 (H) 06/12/2018   CHOL 156 06/12/2018   TRIG 110.0 06/12/2018   HDL 31.90 (L) 06/12/2018   LDLDIRECT 109.0 10/09/2017   LDLCALC 102 (H) 06/12/2018   ALT 37 06/12/2018   AST 27 06/12/2018   NA 138 06/12/2018   K 3.9 06/12/2018   CL 105 06/12/2018   CREATININE 0.89 06/12/2018   BUN 20 06/12/2018   CO2 26 06/12/2018   TSH 0.78 12/04/2015   PSA 0.2 03/15/2018   INR 1.3 (A) 04/08/2017   HGBA1C 5.2 06/12/2018   MICROALBUR 2.3 (H) 06/12/2018    No results found.  Assessment & Plan:   Problem List Items Addressed This Visit    Chronic refractory osteomyelitis of right foot ([place])    Secondary to neuropathic foot ulcer.       Diabetes mellitus type 2 in obese ([place]) - Primary    With nephropathy and neuropathy. 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 More surgery is planned due to ongoing infection of bone.  This more recently started with transmetatarsal amputation about 2 years ago,  subsequent plantar ulcer and now positive MRI findings of residual bone infection of the first metatarsal base 3 weeks into his additional prolonged course of Levaquin.        Neuropathy of both feet    Secondary to spinal stenosis, resulting in chronic pressure ulcers and osteomyelitis .  Long discussion with patient regarding his contribution to ongoing foot infection due to non compliance with Non Weight bearing status.       Obesity (BMI 30-39.9)    I have congratulated him in reducing  His   BMI and recommended continued adherence a low glycemic index diet and participation in regular exercise in whatever form is possible given his NWB status for right foot pressure ulcer         Other Visit Diagnoses    Need for immunization against influenza       Relevant Orders   Flu Vaccine QUAD 36+ mos IM (Completed)  A total of 25 minutes of face to face time was spent with patient more than half of which was spent in counselling about the above mentioned conditions  and coordination of care  I have discontinued Samarion C. Pressly's metFORMIN. I am also having him maintain his multivitamin with minerals, vitamin C, acyclovir, and pregabalin.  No orders of the defined types were placed in this encounter.   Medications Discontinued During This Encounter  Medication Reason  . metFORMIN (GLUCOPHAGE) 500 MG tablet     Follow-up: Return in about 3 months (around 09/20/2018) for follow up diabetes.   Crecencio Mc, MD

## 2018-06-20 NOTE — Patient Instructions (Addendum)
WELL DONE!!!    You do not need to resume metformin unless your fastings stay above 130   Let's repeat your a1c in 3 months (after Jan 16)     NON WEIGHT BEARING MEANS NEVER BEARING WEIGHT ON THE AREA OF THE ULCER !  NOT EVEN WHEN SITTING

## 2018-06-23 NOTE — Assessment & Plan Note (Signed)
I have congratulated him in reducing  His   BMI and recommended continued adherence a low glycemic index diet and participation in regular exercise in whatever form is possible given his NWB status for right foot pressure ulcer

## 2018-06-23 NOTE — Assessment & Plan Note (Signed)
Secondary to neuropathic foot ulcer.

## 2018-06-23 NOTE — Assessment & Plan Note (Signed)
Now resolved with weight loss.   Lab Results  Component Value Date   ALT 37 06/12/2018   AST 27 06/12/2018   ALKPHOS 38 (L) 06/12/2018   BILITOT 0.6 06/12/2018

## 2018-06-23 NOTE — Assessment & Plan Note (Signed)
Secondary to spinal stenosis, resulting in chronic pressure ulcers and osteomyelitis .  Long discussion with patient regarding his contribution to ongoing foot infection due to non compliance with Non Weight bearing status.

## 2018-06-23 NOTE — Assessment & Plan Note (Signed)
He did not tolerate wellbutrin and declines additional treatment.  His mood and motivation have improved.

## 2018-06-23 NOTE — Assessment & Plan Note (Signed)
With nephropathy and neuropathy. Now diet controlled  Having stopped metformin due to recurrent hypoglycemia    Lab Results  Component Value Date   HGBA1C 5.2 06/12/2018   Lab Results  Component Value Date   MICROALBUR 2.3 (H) 06/12/2018

## 2018-06-23 NOTE — Assessment & Plan Note (Addendum)
With chronic osteomyelitis of foot , now receiving care at [facility]. More surgery is planned due to ongoing infection of bone.  This more recently started with transmetatarsal amputation about 2 years ago,  subsequent plantar ulcer and now positive MRI findings of residual bone infection of the first metatarsal base 3 weeks into his additional prolonged course of Levaquin.

## 2018-06-24 ENCOUNTER — Other Ambulatory Visit: Payer: Self-pay | Admitting: Internal Medicine

## 2018-06-25 NOTE — Telephone Encounter (Signed)
Looks like this medication was discontinued on 06/20/2018. 

## 2018-06-28 DIAGNOSIS — Z8614 Personal history of Methicillin resistant Staphylococcus aureus infection: Secondary | ICD-10-CM | POA: Diagnosis not present

## 2018-06-28 DIAGNOSIS — E11621 Type 2 diabetes mellitus with foot ulcer: Secondary | ICD-10-CM | POA: Diagnosis not present

## 2018-06-28 DIAGNOSIS — L97512 Non-pressure chronic ulcer of other part of right foot with fat layer exposed: Secondary | ICD-10-CM | POA: Diagnosis not present

## 2018-06-28 DIAGNOSIS — M86671 Other chronic osteomyelitis, right ankle and foot: Secondary | ICD-10-CM | POA: Diagnosis not present

## 2018-06-28 DIAGNOSIS — E1140 Type 2 diabetes mellitus with diabetic neuropathy, unspecified: Secondary | ICD-10-CM | POA: Diagnosis not present

## 2018-07-10 DIAGNOSIS — B9561 Methicillin susceptible Staphylococcus aureus infection as the cause of diseases classified elsewhere: Secondary | ICD-10-CM | POA: Diagnosis not present

## 2018-07-10 DIAGNOSIS — G4730 Sleep apnea, unspecified: Secondary | ICD-10-CM | POA: Diagnosis not present

## 2018-07-10 DIAGNOSIS — Z881 Allergy status to other antibiotic agents status: Secondary | ICD-10-CM | POA: Diagnosis not present

## 2018-07-10 DIAGNOSIS — T8743 Infection of amputation stump, right lower extremity: Secondary | ICD-10-CM | POA: Diagnosis not present

## 2018-07-10 DIAGNOSIS — E1169 Type 2 diabetes mellitus with other specified complication: Secondary | ICD-10-CM | POA: Diagnosis not present

## 2018-07-10 DIAGNOSIS — E11621 Type 2 diabetes mellitus with foot ulcer: Secondary | ICD-10-CM | POA: Diagnosis not present

## 2018-07-10 DIAGNOSIS — Z981 Arthrodesis status: Secondary | ICD-10-CM | POA: Diagnosis not present

## 2018-07-10 DIAGNOSIS — E1140 Type 2 diabetes mellitus with diabetic neuropathy, unspecified: Secondary | ICD-10-CM | POA: Diagnosis not present

## 2018-07-10 DIAGNOSIS — E669 Obesity, unspecified: Secondary | ICD-10-CM | POA: Diagnosis not present

## 2018-07-10 DIAGNOSIS — Z79899 Other long term (current) drug therapy: Secondary | ICD-10-CM | POA: Diagnosis not present

## 2018-07-10 DIAGNOSIS — Z6833 Body mass index (BMI) 33.0-33.9, adult: Secondary | ICD-10-CM | POA: Diagnosis not present

## 2018-07-10 DIAGNOSIS — G8929 Other chronic pain: Secondary | ICD-10-CM | POA: Diagnosis not present

## 2018-07-10 DIAGNOSIS — L97512 Non-pressure chronic ulcer of other part of right foot with fat layer exposed: Secondary | ICD-10-CM | POA: Diagnosis not present

## 2018-07-10 DIAGNOSIS — M86671 Other chronic osteomyelitis, right ankle and foot: Secondary | ICD-10-CM | POA: Diagnosis not present

## 2018-07-10 DIAGNOSIS — G8918 Other acute postprocedural pain: Secondary | ICD-10-CM | POA: Diagnosis not present

## 2018-07-18 DIAGNOSIS — M86671 Other chronic osteomyelitis, right ankle and foot: Secondary | ICD-10-CM | POA: Diagnosis not present

## 2018-07-18 DIAGNOSIS — E1140 Type 2 diabetes mellitus with diabetic neuropathy, unspecified: Secondary | ICD-10-CM | POA: Diagnosis not present

## 2018-07-18 DIAGNOSIS — L97512 Non-pressure chronic ulcer of other part of right foot with fat layer exposed: Secondary | ICD-10-CM | POA: Diagnosis not present

## 2018-08-09 DIAGNOSIS — L97512 Non-pressure chronic ulcer of other part of right foot with fat layer exposed: Secondary | ICD-10-CM | POA: Diagnosis not present

## 2018-08-09 DIAGNOSIS — E1140 Type 2 diabetes mellitus with diabetic neuropathy, unspecified: Secondary | ICD-10-CM | POA: Diagnosis not present

## 2018-08-09 DIAGNOSIS — M86671 Other chronic osteomyelitis, right ankle and foot: Secondary | ICD-10-CM | POA: Diagnosis not present

## 2018-09-06 DIAGNOSIS — M86671 Other chronic osteomyelitis, right ankle and foot: Secondary | ICD-10-CM | POA: Diagnosis not present

## 2018-09-06 DIAGNOSIS — E1140 Type 2 diabetes mellitus with diabetic neuropathy, unspecified: Secondary | ICD-10-CM | POA: Diagnosis not present

## 2018-09-06 DIAGNOSIS — L97512 Non-pressure chronic ulcer of other part of right foot with fat layer exposed: Secondary | ICD-10-CM | POA: Diagnosis not present

## 2018-09-11 DIAGNOSIS — S91309A Unspecified open wound, unspecified foot, initial encounter: Secondary | ICD-10-CM | POA: Diagnosis not present

## 2018-09-13 ENCOUNTER — Other Ambulatory Visit (INDEPENDENT_AMBULATORY_CARE_PROVIDER_SITE_OTHER): Payer: BLUE CROSS/BLUE SHIELD

## 2018-09-13 DIAGNOSIS — E1169 Type 2 diabetes mellitus with other specified complication: Secondary | ICD-10-CM

## 2018-09-13 DIAGNOSIS — E669 Obesity, unspecified: Secondary | ICD-10-CM | POA: Diagnosis not present

## 2018-09-13 LAB — LIPID PANEL
Cholesterol: 165 mg/dL (ref 0–200)
HDL: 36 mg/dL — ABNORMAL LOW (ref >39.00)
LDL Cholesterol: 106 mg/dL — ABNORMAL HIGH (ref 0–99)
NonHDL: 128.73
Total CHOL/HDL Ratio: 5
Triglycerides: 112 mg/dL (ref 0.0–149.0)
VLDL: 22.4 mg/dL (ref 0.0–40.0)

## 2018-09-13 LAB — COMPREHENSIVE METABOLIC PANEL
ALT: 33 U/L (ref 0–53)
AST: 28 U/L (ref 0–37)
Albumin: 4.3 g/dL (ref 3.5–5.2)
Alkaline Phosphatase: 36 U/L — ABNORMAL LOW (ref 39–117)
BUN: 17 mg/dL (ref 6–23)
CO2: 26 mEq/L (ref 19–32)
Calcium: 9.2 mg/dL (ref 8.4–10.5)
Chloride: 104 mEq/L (ref 96–112)
Creatinine, Ser: 0.98 mg/dL (ref 0.40–1.50)
GFR: 84.68 mL/min (ref >60.00)
Glucose, Bld: 98 mg/dL (ref 70–99)
Potassium: 4.2 mEq/L (ref 3.5–5.1)
Sodium: 138 mEq/L (ref 135–145)
Total Bilirubin: 0.8 mg/dL (ref 0.2–1.2)
Total Protein: 7.8 g/dL (ref 6.0–8.3)

## 2018-09-13 LAB — HEMOGLOBIN A1C: Hgb A1c MFr Bld: 5.4 % (ref 4.6–6.5)

## 2018-09-20 ENCOUNTER — Ambulatory Visit: Payer: BLUE CROSS/BLUE SHIELD | Admitting: Internal Medicine

## 2018-09-20 ENCOUNTER — Encounter: Payer: Self-pay | Admitting: Internal Medicine

## 2018-09-20 VITALS — BP 110/70 | HR 77 | Temp 97.5°F | Resp 16 | Ht 70.0 in | Wt 229.8 lb

## 2018-09-20 DIAGNOSIS — E1169 Type 2 diabetes mellitus with other specified complication: Secondary | ICD-10-CM | POA: Diagnosis not present

## 2018-09-20 DIAGNOSIS — R030 Elevated blood-pressure reading, without diagnosis of hypertension: Secondary | ICD-10-CM | POA: Diagnosis not present

## 2018-09-20 DIAGNOSIS — G5793 Unspecified mononeuropathy of bilateral lower limbs: Secondary | ICD-10-CM

## 2018-09-20 DIAGNOSIS — E785 Hyperlipidemia, unspecified: Secondary | ICD-10-CM

## 2018-09-20 DIAGNOSIS — Z79899 Other long term (current) drug therapy: Secondary | ICD-10-CM

## 2018-09-20 DIAGNOSIS — E669 Obesity, unspecified: Secondary | ICD-10-CM

## 2018-09-20 DIAGNOSIS — M86671 Other chronic osteomyelitis, right ankle and foot: Secondary | ICD-10-CM

## 2018-09-20 MED ORDER — PRAVASTATIN SODIUM 20 MG PO TABS: 20.0000 mg | ORAL_TABLET | Freq: Every day | ORAL | 3 refills | Status: DC

## 2018-09-20 NOTE — Patient Instructions (Addendum)
WE'RE STARTING PRavastatin to reduce your risk of heart attack and stroke  You'll need non fasting liver check  in 6 weeks  (lab visit only)  I recommend seeing a  [facility] neurologist for  Discussion of long term management of neuropathy to protect left foot and enable you to return to work    The goal for optimal blood pressure management is 120/70.  Please purchase a home BP kit and check your blood pressure a few times at home and send me the readings so I can determine if you need medication

## 2018-09-20 NOTE — Progress Notes (Signed)
Subjective:  Patient ID: [name], male    DOB: 08-Sep-1965  Age: 53 y.o. MRN: [id number]  CC: The primary encounter diagnosis was Blood pressure elevated without history of HTN. Diagnoses of Long-term use of high-risk medication, Diabetes mellitus type 2 in obese [facility]), Chronic refractory osteomyelitis of right foot ([place]), Neuropathy of both feet, and Hyperlipidemia associated with type 2 diabetes mellitus ([place]) were also pertinent to this visit.  HPI [name] presents for 3 month follow up on diabetes with peripheral neuropathy. .  Patient has no complaints today.  Patient is following a low glycemic index diet and taking all prescribed medications regularly without side effects.  Fasting sugars have been under  140 most of the time and post prandials have been under 160 except on rare occasions. Patient is exercising about 3 times per week and intentionally trying to lose weight .  Patient has had an eye exam in the last 12 months and has severe neuropathy with multiple amputations  due to nonhealing ulcers and chronic ostemoyelitis involving the right  foot.  Last amputution was in early November  Last   Wound car e visit was Jan 6 at [facility].    Cardiac risk is 14%  Patient is up to date on all recommended vaccinations  2) fluctuating blood pressure  With elevations noted during visits to podiatrists's office Outpatient Medications Prior to Visit  Medication Sig Dispense Refill  . acyclovir (ZOVIRAX) 400 MG tablet Take 1 tablet by mouth as needed.  0  . Ascorbic Acid (VITAMIN C) 100 MG tablet Take 500 mg by mouth daily.    . Multiple Vitamins-Minerals (MULTIVITAMIN WITH MINERALS) tablet Take 1 tablet by mouth daily.    . pregabalin (LYRICA) 75 MG capsule Take 1 capsule (75 mg total) by mouth 3 (three) times daily. 90 capsule 2   No facility-administered medications prior to visit.     Review of Systems;  Patient denies headache, fevers, malaise, unintentional weight loss, skin  rash, eye pain, sinus congestion and sinus pain, sore throat, dysphagia,  hemoptysis , cough, dyspnea, wheezing, chest pain, palpitations, orthopnea, edema, abdominal pain, nausea, melena, diarrhea, constipation, flank pain, dysuria, hematuria, urinary  Frequency, nocturia, numbness, tingling, seizures,  Focal weakness, Loss of consciousness,  Tremor, insomnia, depression, anxiety, and suicidal ideation.      Objective:  BP 110/70 (BP Location: Right Arm, Patient Position: Sitting, Cuff Size: Normal)   Pulse 77   Temp (!) 97.5 F (36.4 C) (Oral)   Resp 16   Ht 5\' 10"  (1.778 m)   Wt 229 lb 12.8 oz (104.2 kg)   SpO2 92%   BMI 32.97 kg/m   BP Readings from Last 3 Encounters:  09/20/18 110/70  06/20/18 (!) 116/58  05/16/18 122/68    Wt Readings from Last 3 Encounters:  09/20/18 229 lb 12.8 oz (104.2 kg)  06/20/18 231 lb 12.8 oz (105.1 kg)  05/16/18 236 lb (107 kg)    General appearance: alert, cooperative and appears stated age Ears: normal TM's and external ear canals both ears Throat: lips, mucosa, and tongue normal; teeth and gums normal Neck: no adenopathy, no carotid bruit, supple, symmetrical, trachea midline and thyroid not enlarged, symmetric, no tenderness/mass/nodules Back: symmetric, no curvature. ROM normal. No CVA tenderness. Lungs: clear to auscultation bilaterally Heart: regular rate and rhythm, S1, S2 normal, no murmur, click, rub or gallop Abdomen: soft, non-tender; bowel sounds normal; no masses,  no organomegaly Pulses: 2+ and symmetric Skin: Skin color, texture,  turgor normal. No rashes or lesions Lymph nodes: Cervical, supraclavicular, and axillary nodes normal.  Lab Results  Component Value Date   HGBA1C 5.4 09/13/2018   HGBA1C 5.2 06/12/2018   HGBA1C 7.1 (H) 03/15/2018    Lab Results  Component Value Date   CREATININE 0.98 09/13/2018   CREATININE 0.89 06/12/2018   CREATININE 0.80 03/15/2018    Lab Results  Component Value Date   WBC 7.6  04/08/2017   HGB 13.4 (A) 04/08/2017   HCT 40 (A) 04/08/2017   PLT 532 (A) 04/08/2017   GLUCOSE 98 09/13/2018   CHOL 165 09/13/2018   TRIG 112.0 09/13/2018   HDL 36.00 (L) 09/13/2018   LDLDIRECT 109.0 10/09/2017   LDLCALC 106 (H) 09/13/2018   ALT 33 09/13/2018   AST 28 09/13/2018   NA 138 09/13/2018   K 4.2 09/13/2018   CL 104 09/13/2018   CREATININE 0.98 09/13/2018   BUN 17 09/13/2018   CO2 26 09/13/2018   TSH 0.78 12/04/2015   PSA 0.2 03/15/2018   INR 1.3 (A) 04/08/2017   HGBA1C 5.4 09/13/2018   MICROALBUR 2.3 (H) 06/12/2018    No results found.  Assessment & Plan:   Problem List Items Addressed This Visit    Chronic refractory osteomyelitis of right foot ([place])    Secondary to neuropathic foot ulcer. He has had multiple amputations due to in part to nonadherence to regimen of non weightbearing needed .  Since he has changed from [facility] to [place] and began adhereing to recommendations his wound has improved.       Diabetes mellitus type 2 in obese [facility])    Well controlled with adherence to low GI diet   Lab Results  Component Value Date   HGBA1C 5.4 09/13/2018         Relevant Medications   pravastatin (PRAVACHOL) 20 MG tablet   Hyperlipidemia associated with type 2 diabetes mellitus ([place])    based on current lipid profile, the risk of clinically significant Coronary artery disease is 10% over the next 10 years, using the Framingham risk calculator. Starting pravastatin. return in 6 weeks for lfts.  Lab Results  Component Value Date   CHOL 165 09/13/2018   HDL 36.00 (L) 09/13/2018   LDLCALC 106 (H) 09/13/2018   LDLDIRECT 109.0 10/09/2017   TRIG 112.0 09/13/2018   CHOLHDL 5 09/13/2018          Relevant Medications   pravastatin (PRAVACHOL) 20 MG tablet   Neuropathy of both feet    Secondary to spinal stenosis, resulting in chronic pressure ulcers and osteomyelitis .  Recommending referral to neurology to discuss available adaptive measures to facilitate his  return to work .        Other Visit Diagnoses    Blood pressure elevated without history of HTN    -  Primary   Relevant Orders   DME Other see comment   Long-term use of high-risk medication       Relevant Orders   Hepatic function panel     A total of 25 minutes of face to face time was spent with patient more than half of which was spent in counselling about the above mentioned conditions  and coordination of care  I am having Renley C. Kolenovic start on pravastatin. I am also having him maintain his multivitamin with minerals, vitamin C, acyclovir, and pregabalin.  Meds ordered this encounter  Medications  . pravastatin (PRAVACHOL) 20 MG tablet    Sig: Take 1 tablet (  20 mg total) by mouth daily.    Dispense:  90 tablet    Refill:  3    There are no discontinued medications.  Follow-up: Return in about 3 months (around 12/20/2018) for follow up diabetes.   Crecencio Mc, MD

## 2018-09-22 DIAGNOSIS — E1169 Type 2 diabetes mellitus with other specified complication: Secondary | ICD-10-CM | POA: Insufficient documentation

## 2018-09-22 DIAGNOSIS — E785 Hyperlipidemia, unspecified: Secondary | ICD-10-CM

## 2018-09-22 NOTE — Assessment & Plan Note (Signed)
Well controlled with adherence to low GI diet   Lab Results  Component Value Date   HGBA1C 5.4 09/13/2018    

## 2018-09-22 NOTE — Assessment & Plan Note (Signed)
based on current lipid profile, the risk of clinically significant Coronary artery disease is 10% over the next 10 years, using the Framingham risk calculator. Starting pravastatin. return in 6 weeks for lfts.  Lab Results  Component Value Date   CHOL 165 09/13/2018   HDL 36.00 (L) 09/13/2018   LDLCALC 106 (H) 09/13/2018   LDLDIRECT 109.0 10/09/2017   TRIG 112.0 09/13/2018   CHOLHDL 5 09/13/2018

## 2018-09-22 NOTE — Assessment & Plan Note (Signed)
Secondary to spinal stenosis, resulting in chronic pressure ulcers and osteomyelitis .  Recommending referral to neurology to discuss available adaptive measures to facilitate his return to work .

## 2018-09-22 NOTE — Assessment & Plan Note (Signed)
Secondary to neuropathic foot ulcer. He has had multiple amputations due to in part to nonadherence to regimen of non weightbearing needed .  Since he has changed from [facility] to [place] and began adhereing to recommendations his wound has improved.

## 2018-10-01 DIAGNOSIS — E1140 Type 2 diabetes mellitus with diabetic neuropathy, unspecified: Secondary | ICD-10-CM | POA: Diagnosis not present

## 2018-10-01 DIAGNOSIS — M86671 Other chronic osteomyelitis, right ankle and foot: Secondary | ICD-10-CM | POA: Diagnosis not present

## 2018-10-01 DIAGNOSIS — L97512 Non-pressure chronic ulcer of other part of right foot with fat layer exposed: Secondary | ICD-10-CM | POA: Diagnosis not present

## 2018-10-23 ENCOUNTER — Other Ambulatory Visit: Payer: Self-pay | Admitting: Internal Medicine

## 2018-10-23 DIAGNOSIS — L97512 Non-pressure chronic ulcer of other part of right foot with fat layer exposed: Secondary | ICD-10-CM

## 2018-10-23 DIAGNOSIS — G5793 Unspecified mononeuropathy of bilateral lower limbs: Secondary | ICD-10-CM

## 2018-10-26 DIAGNOSIS — E1140 Type 2 diabetes mellitus with diabetic neuropathy, unspecified: Secondary | ICD-10-CM | POA: Diagnosis not present

## 2018-10-26 DIAGNOSIS — S98321D Partial traumatic amputation of right midfoot, subsequent encounter: Secondary | ICD-10-CM | POA: Diagnosis not present

## 2018-11-01 ENCOUNTER — Other Ambulatory Visit (INDEPENDENT_AMBULATORY_CARE_PROVIDER_SITE_OTHER): Payer: BLUE CROSS/BLUE SHIELD

## 2018-11-01 DIAGNOSIS — Z79899 Other long term (current) drug therapy: Secondary | ICD-10-CM

## 2018-11-01 LAB — HEPATIC FUNCTION PANEL
ALT: 38 U/L (ref 0–53)
AST: 33 U/L (ref 0–37)
Albumin: 4.6 g/dL (ref 3.5–5.2)
Alkaline Phosphatase: 38 U/L — ABNORMAL LOW (ref 39–117)
Bilirubin, Direct: 0.2 mg/dL (ref 0.0–0.3)
Total Bilirubin: 0.9 mg/dL (ref 0.2–1.2)
Total Protein: 8.1 g/dL (ref 6.0–8.3)

## 2018-11-21 ENCOUNTER — Other Ambulatory Visit: Payer: Self-pay | Admitting: Internal Medicine

## 2018-11-21 NOTE — Telephone Encounter (Signed)
Refilled: 03/20/2018 Last OV: 09/20/2018 Next OV: 12/28/2018

## 2018-12-19 DIAGNOSIS — L97512 Non-pressure chronic ulcer of other part of right foot with fat layer exposed: Secondary | ICD-10-CM | POA: Diagnosis not present

## 2018-12-21 ENCOUNTER — Ambulatory Visit: Payer: BLUE CROSS/BLUE SHIELD | Admitting: Internal Medicine

## 2018-12-28 ENCOUNTER — Encounter: Payer: Self-pay | Admitting: Internal Medicine

## 2018-12-28 ENCOUNTER — Other Ambulatory Visit: Payer: Self-pay

## 2018-12-28 ENCOUNTER — Ambulatory Visit (INDEPENDENT_AMBULATORY_CARE_PROVIDER_SITE_OTHER): Payer: BLUE CROSS/BLUE SHIELD | Admitting: Internal Medicine

## 2018-12-28 DIAGNOSIS — E1142 Type 2 diabetes mellitus with diabetic polyneuropathy: Secondary | ICD-10-CM | POA: Diagnosis not present

## 2018-12-28 DIAGNOSIS — E669 Obesity, unspecified: Secondary | ICD-10-CM

## 2018-12-28 DIAGNOSIS — E1169 Type 2 diabetes mellitus with other specified complication: Secondary | ICD-10-CM | POA: Diagnosis not present

## 2018-12-28 DIAGNOSIS — M86671 Other chronic osteomyelitis, right ankle and foot: Secondary | ICD-10-CM

## 2018-12-28 NOTE — Progress Notes (Signed)
Virtual Visit via Doxy.me Note  This visit type was conducted due to national recommendations for restrictions regarding the COVID-19 pandemic (e.g. social distancing).  This format is felt to be most appropriate for this patient at this time.  All issues noted in this document were discussed and addressed.  No physical exam was performed (except for noted visual exam findings with Video Visits).   I connected with@ on 12/28/18 at  9:00 AM EDT by a video enabled telemedicine application and verified that I am speaking with the correct person using two identifiers. Location patient: home Location provider: work  Persons participating in the virtual visit: patient, provider  I discussed the limitations, risks, security and privacy concerns of performing an evaluation and management service by telephone and the availability of in person appointments. I also discussed with the patient that there may be a patient responsible charge related to this service. The patient expressed understanding and agreed to proceed.  Reason for visit: follow up on type 2 DM with neuropathy  With prior amputations for nonhealing ulcers and osteomyelitis  HPI:  The patient has no signs or symptoms of COVID 19 infection (fever, cough, sore throat  or shortness of breath beyond what is typical for patient).  Patient denies contact with other persons with the above mentioned symptoms or with anyone confirmed to have COVID 19 .  He is practicing safe distancing   Had a virtual visit with podiatry on April 22 for new onset skin ulcer which has occurred on the inferior side of his right foot stump.   He was advised to reduce his activities that require weight bearing on the stump and follow up in 3 weeks for a face to face visit.  He is underestimating the significance of the ulcer because all he sees is a blood stained callous . He is apprehensive about a face to face visit at [facility] because of the epidemic. Addressed his knowledge  deficit today and advised him to keep the 3 week follow up with podiatry at [facility]   DM:  He has been adhering to a low glycemic index diet.  His wife Santiago Glad has been very supportive and he has been riding a two wheeled bicycle daily for short distances and working out with bands.  His weight has been stable.  Random blood sugars have been < 100.    Allergic rhinitis:  He has been bothered somewhat by the pollen with sneezing and congestion.  Denies sinus pain, purulent congstion   ROS: Patient denies headache, fevers, malaise, unintentional weight loss, skin rash, eye pain, sinus congestion and sinus pain, sore throat, dysphagia,  hemoptysis , cough, dyspnea, wheezing, chest pain, palpitations, orthopnea, edema, abdominal pain, nausea, melena, diarrhea, constipation, flank pain, dysuria, hematuria, urinary  Frequency, nocturia, numbness, tingling, seizures,  Focal weakness, Loss of consciousness,  Tremor, insomnia, depression, anxiety, and suicidal ideation.     Past Medical History:  Diagnosis Date  . Allergy   . Depression   . Sleep apnea     Past Surgical History:  Procedure Laterality Date  . FOOT SURGERY Right 02/14   cyst removed  . 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 TOE AMPUTATION Right 06/30/2016   5 toes    Family History  Problem Relation Age of Onset  . Heart disease Father   . Stroke Father   . Heart attack Father 45  . Heart disease Maternal Grandmother   . Stroke Maternal Grandmother   .  Heart disease Paternal Grandfather   . Stroke Paternal Grandfather     SOCIAL HX: married,  Disabled secondary to forefoot amputation    Current Outpatient Medications:  .  acyclovir (ZOVIRAX) 400 MG tablet, Take 1 tablet by mouth as needed., Disp: , Rfl: 0 .  Multiple Vitamins-Minerals (MULTIVITAMIN WITH MINERALS) tablet, Take 1 tablet by mouth daily., Disp: , Rfl:  .  pravastatin (PRAVACHOL) 20 MG tablet, Take 1 tablet (20 mg total) by mouth daily., Disp: 90  tablet, Rfl: 3 .  pregabalin (LYRICA) 75 MG capsule, TAKE 1 CAPSULE BY MOUTH THREE TIMES A DAY, Disp: 90 capsule, Rfl: 5 .  Ascorbic Acid (VITAMIN C) 100 MG tablet, Take 500 mg by mouth daily., Disp: , Rfl:   EXAM:  VITALS per patient if applicable:  GENERAL: alert, oriented, appears well and in no acute distress  HEENT: atraumatic, conjunttiva clear, no obvious abnormalities on inspection of external nose and ears  NECK: normal movements of the head and neck  LUNGS: on inspection no signs of respiratory distress, breathing rate appears normal, no obvious gross SOB, gasping or wheezing  CV: no obvious cyanosis  MS: moves all visible extremities without noticeable abnormality  PSYCH/NEURO: pleasant and cooperative, no obvious depression or anxiety, speech and thought processing grossly intact  Skin:    ASSESSMENT AND PLAN:  Discussed the following assessment and plan:  Diabetes mellitus type 2 in obese (HCC) - Plan: Hemoglobin A1c, Comprehensive metabolic panel  Diabetic polyneuropathy associated with type 2 diabetes mellitus (HCC)  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  Currently has a callous that has bled under the skin of his stump , which I have explained to his is NOT JUST a callous, but a new ulcer. May be due to the pressure on the stump caused by his bike pedal.  Needs to discuss his exercise activities with podiatry;encouarged to comply with their recs and follow up in 3 weeks    Chronic refractory osteomyelitis of right foot ([place]) S/p partial right foot amputation . Managed by [facility] .  He was advised to limit his activities  given his newly found ulcer found several weeks ago   Obesity (BMI 30-39.9) I have congratulated him in starting an exercise program and adhering  To  a low glycemic index diet     I discussed the assessment and treatment plan with the patient. The patient was provided an opportunity to ask questions and all were answered. The patient agreed with the plan and demonstrated an understanding of the instructions.   The patient was advised to call back or seek an in-person evaluation if the symptoms worsen or if the condition fails to improve as anticipated.  I provided 25 minutes of non-face-to-face time during this encounter.   Crecencio Mc, MD

## 2018-12-28 NOTE — Patient Instructions (Signed)
Good to see you and Santiago Glad!  Kudo's to both of you for staying on track with diet and exercise.  I have ordered non fasting labs for you to do at your convenience  DON'T [facility] your appointment with podiatry.  That callous IS an ulcer and may be deeper than you realize.  Here is the advice that I am giving patients who suspect they may have COVID 19 infection:   " We have been advised by the latest memo from the CDC to presume COVID 19 infection in all patients with fever (temp 100.4 or higher),  cough (productive or non productive) and shortness of breath who do NOT have an alternative diagnosis (influenza, bacterial pneumonia).    The safest measure for everyone is to put yourself under quarantine.    A true "quarantine" is again, reserved for those patients with  Symptoms  It means :  YOU DO NOT LEAVE THE HOUSE. YOU SHOULD TAKE YOUR MEALS ALONE. YOU SHOULD NOT  SHARE BATHROOMS  FOR HOW LONG?:   You should stay under quarantine until you can answer "yes" to all 3 of the following conditions:  Symptoms have been present  for a minimum of 7 days ,   You  Have been afebrile for at least 3 days   (and that means you cannot be taking tylenol, motrin or aleve)  Your other symptoms must be getting better   I would limit contact with your children to only when you are masked and wearing gloves

## 2018-12-30 NOTE — Assessment & Plan Note (Signed)
Well controlled with adherence to low GI diet   Lab Results  Component Value Date   HGBA1C 5.4 09/13/2018

## 2018-12-30 NOTE — Assessment & Plan Note (Signed)
I have congratulated him in starting an exercise program and adhering  To  a low glycemic index diet

## 2018-12-30 NOTE — Assessment & Plan Note (Addendum)
S/p partial right foot amputation . Managed by [facility] .  He was advised to limit his activities given his newly found ulcer found several weeks ago

## 2018-12-30 NOTE — Assessment & Plan Note (Signed)
With multiple amputations over the past several years due to osteomyelitis.  Currently has a callous that has bled under the skin of his stump , which I have explained to his is NOT JUST a callous, but a new ulcer. May be due to the pressure on the stump caused by his bike pedal.  Needs to discuss his exercise activities with podiatry;encouarged to comply with their recs and follow up in 3 weeks

## 2019-01-14 DIAGNOSIS — E1140 Type 2 diabetes mellitus with diabetic neuropathy, unspecified: Secondary | ICD-10-CM | POA: Diagnosis not present

## 2019-01-14 DIAGNOSIS — L97512 Non-pressure chronic ulcer of other part of right foot with fat layer exposed: Secondary | ICD-10-CM | POA: Diagnosis not present

## 2019-01-23 ENCOUNTER — Other Ambulatory Visit: Payer: BLUE CROSS/BLUE SHIELD

## 2019-01-28 DIAGNOSIS — L97512 Non-pressure chronic ulcer of other part of right foot with fat layer exposed: Secondary | ICD-10-CM | POA: Diagnosis not present

## 2019-01-28 DIAGNOSIS — M7731 Calcaneal spur, right foot: Secondary | ICD-10-CM | POA: Diagnosis not present

## 2019-02-04 DIAGNOSIS — E1140 Type 2 diabetes mellitus with diabetic neuropathy, unspecified: Secondary | ICD-10-CM | POA: Diagnosis not present

## 2019-02-04 DIAGNOSIS — Z89431 Acquired absence of right foot: Secondary | ICD-10-CM | POA: Diagnosis not present

## 2019-02-04 DIAGNOSIS — L97512 Non-pressure chronic ulcer of other part of right foot with fat layer exposed: Secondary | ICD-10-CM | POA: Diagnosis not present

## 2019-02-14 NOTE — Telephone Encounter (Signed)
Pt is scheduled for tomorrow at 8:30am for a virtual visit.

## 2019-02-15 ENCOUNTER — Ambulatory Visit (INDEPENDENT_AMBULATORY_CARE_PROVIDER_SITE_OTHER): Payer: BC Managed Care – PPO

## 2019-02-15 ENCOUNTER — Other Ambulatory Visit: Payer: Self-pay

## 2019-02-15 ENCOUNTER — Encounter: Payer: Self-pay | Admitting: Internal Medicine

## 2019-02-15 ENCOUNTER — Other Ambulatory Visit (INDEPENDENT_AMBULATORY_CARE_PROVIDER_SITE_OTHER): Payer: BC Managed Care – PPO

## 2019-02-15 ENCOUNTER — Ambulatory Visit (INDEPENDENT_AMBULATORY_CARE_PROVIDER_SITE_OTHER): Payer: BC Managed Care – PPO | Admitting: Internal Medicine

## 2019-02-15 DIAGNOSIS — E1169 Type 2 diabetes mellitus with other specified complication: Secondary | ICD-10-CM

## 2019-02-15 DIAGNOSIS — Z202 Contact with and (suspected) exposure to infections with a predominantly sexual mode of transmission: Secondary | ICD-10-CM

## 2019-02-15 DIAGNOSIS — R300 Dysuria: Secondary | ICD-10-CM

## 2019-02-15 DIAGNOSIS — M5432 Sciatica, left side: Secondary | ICD-10-CM

## 2019-02-15 DIAGNOSIS — E669 Obesity, unspecified: Secondary | ICD-10-CM

## 2019-02-15 DIAGNOSIS — E559 Vitamin D deficiency, unspecified: Secondary | ICD-10-CM

## 2019-02-15 DIAGNOSIS — M5137 Other intervertebral disc degeneration, lumbosacral region: Secondary | ICD-10-CM | POA: Diagnosis not present

## 2019-02-15 MED ORDER — FLUCONAZOLE 150 MG PO TABS: 150.0000 mg | ORAL_TABLET | Freq: Every day | ORAL | 0 refills | Status: DC

## 2019-02-15 MED ORDER — PREDNISONE 10 MG PO TABS: ORAL_TABLET | ORAL | 0 refills | Status: DC

## 2019-02-15 MED ORDER — TIZANIDINE HCL 4 MG PO TABS: 4.0000 mg | ORAL_TABLET | Freq: Four times a day (QID) | ORAL | 0 refills | Status: DC | PRN

## 2019-02-15 NOTE — Patient Instructions (Signed)
Fluconazole 150 mg once daily x 3 days  Prednisone taper for your back pain:  6 tablets all at once on Day 1,  Then taper by 1 tablet daily until gone  No motrin or aleve until you have finished the prednisone  . You CAN take up to 3000 mg of acetominophen (tylenol) every day safely  In divided doses (1000 mg every 8  hours  Or 500 mg every 6 hours .) for one week  X rays,  Urinalysis and labs this afternoon.  Janett Billow will call you to set up the appointment

## 2019-02-15 NOTE — Progress Notes (Signed)
Virtual Visit via Doxy.me  This visit type was conducted due to national recommendations for restrictions regarding the OVID-19 pandemic (e.G. social distancing).  This format is felt to be most appropriate for this patient at this time.  All issues noted in this document were discussed and addressed.  No physical exam was performed (except for noted visual exam findings with Video Visits).   I connected with@ on 02/15/19 at  8:30 AM EDT by a video enabled telemedicine application  and verified that I am speaking with the correct person using two identifiers. Location patient: home Location provider: work or home office Persons participating in the virtual visit: patient, provider  I discussed the limitations, risks, security and privacy concerns of performing an evaluation and management service by telephone and the availability of in person appointments. I also discussed with the patient that there may be a patient responsible charge related to this service. The patient expressed understanding and agreed to proceed. Reason for visit: "groin issues"  HPI:   53 yr old male with type 2 DM.  Well controlled,  Peripheral neuropathy  Presents with recent onset of urethral pain with  urination that improves with less diet soda intake and more plain water intake .   H,e as also been noting bilateral inguinal discomfort without lymphadenopathy for the past several days  2) Low back pain  Which radiates to left hip,  Started while sitting for the last 3 days .  Initial pain occurred suddenly while lifting left leg up to be  in a standing position.  She has been using [employer] and motrin .  Describes  the pain as shop affects his ability to stand  ROS: See pertinent positives and negatives per HPI.  Past Medical History:  Diagnosis Date  . Allergy   . Depression   . Sleep apnea     Past Surgical History:  Procedure Laterality Date  . FOOT SURGERY Right 02/14   cyst removed  . SPINE SURGERY  2004    nerve damage in spine  . TOE AMPUTATION Right 06/30/2016   5 toes    Family History  Problem Relation Age of Onset  . Heart disease Father   . Stroke Father   . Heart attack Father 28  . Heart disease Maternal Grandmother   . Stroke Maternal Grandmother   . Heart disease Paternal Grandfather   . Stroke Paternal Grandfather      Current Outpatient Medications:  .  acyclovir (ZOVIRAX) 400 MG tablet, Take 1 tablet by mouth as needed., Disp: , Rfl: 0 .  Multiple Vitamins-Minerals (MULTIVITAMIN WITH MINERALS) tablet, Take 1 tablet by mouth daily., Disp: , Rfl:  .  pravastatin (PRAVACHOL) 20 MG tablet, Take 1 tablet (20 mg total) by mouth daily., Disp: 90 tablet, Rfl: 3 .  pregabalin (LYRICA) 75 MG capsule, TAKE 1 CAPSULE BY MOUTH THREE TIMES A DAY, Disp: 90 capsule, Rfl: 5 .  Ascorbic Acid (VITAMIN C) 100 MG tablet, Take 500 mg by mouth daily., Disp: , Rfl:   EXAM:  VITALS per patient if applicable:  GENERAL: alert, oriented, appears well and in no acute distress  HEENT: atraumatic, conjunttiva clear, no obvious abnormalities on inspection of external nose and ears  NECK: normal movements of the head and neck  LUNGS: on inspection no signs of respiratory distress, breathing rate appears normal, no obvious gross SOB, gasping or wheezing  CV: no obvious cyanosis  MS: moves all visible extremities without noticeable abnormality  PSYCH/NEURO: pleasant  and cooperative, no obvious depression or anxiety, speech and thought processing grossly intact  ASSESSMENT AND PLAN:  Discussed the following assessment and plan:  No diagnosis found.  No problem-specific Assessment & Plan notes found for this encounter.    I discussed the assessment and treatment plan with the patient. The patient was provided an opportunity to ask questions and all were answered. The patient agreed with the plan and demonstrated an understanding of the instructions.   The patient was advised to call  back or seek an in-person evaluation if the symptoms worsen or if the condition fails to improve as anticipated.  I provided 25 minutes of non-face-to-face time during this encounter.   Crecencio Mc, MD

## 2019-02-15 NOTE — Addendum Note (Signed)
Addended by: Leeanne Rio on: 02/15/2019 02:41 PM   Modules accepted: Orders

## 2019-02-16 LAB — CBC WITH DIFFERENTIAL/PLATELET
Absolute Monocytes: 1134 cells/uL — ABNORMAL HIGH (ref 200–950)
Basophils Absolute: 210 cells/uL — ABNORMAL HIGH (ref 0–200)
Basophils Relative: 3 %
Eosinophils Absolute: 350 cells/uL (ref 15–500)
Eosinophils Relative: 5 %
HCT: 49.8 % (ref 38.5–50.0)
Hemoglobin: 17.6 g/dL — ABNORMAL HIGH (ref 13.2–17.1)
Lymphs Abs: 1827 cells/uL (ref 850–3900)
MCH: 31.5 pg (ref 27.0–33.0)
MCHC: 35.3 g/dL (ref 32.0–36.0)
MCV: 89.2 fL (ref 80.0–100.0)
MPV: 10.7 fL (ref 7.5–12.5)
Monocytes Relative: 16.2 %
Neutro Abs: 3479 cells/uL (ref 1500–7800)
Neutrophils Relative %: 49.7 %
Platelets: 205 10*3/uL (ref 140–400)
RBC: 5.58 10*6/uL (ref 4.20–5.80)
RDW: 13 % (ref 11.0–15.0)
Total Lymphocyte: 26.1 %
WBC: 7 10*3/uL (ref 3.8–10.8)

## 2019-02-16 LAB — COMPREHENSIVE METABOLIC PANEL
AG Ratio: 1.3 (calc) (ref 1.0–2.5)
ALT: 29 U/L (ref 9–46)
AST: 26 U/L (ref 10–35)
Albumin: 4.1 g/dL (ref 3.6–5.1)
Alkaline phosphatase (APISO): 43 U/L (ref 35–144)
BUN: 16 mg/dL (ref 7–25)
CO2: 22 mmol/L (ref 20–32)
Calcium: 9.3 mg/dL (ref 8.6–10.3)
Chloride: 105 mmol/L (ref 98–110)
Creat: 0.89 mg/dL (ref 0.70–1.33)
Globulin: 3.1 g/dL (calc) (ref 1.9–3.7)
Glucose, Bld: 83 mg/dL (ref 65–99)
Potassium: 4.3 mmol/L (ref 3.5–5.3)
Sodium: 138 mmol/L (ref 135–146)
Total Bilirubin: 0.8 mg/dL (ref 0.2–1.2)
Total Protein: 7.2 g/dL (ref 6.1–8.1)

## 2019-02-16 LAB — HEMOGLOBIN A1C
Hgb A1c MFr Bld: 5.1 % of total Hgb (ref <5.7)
Mean Plasma Glucose: 100 (calc)
eAG (mmol/L): 5.5 (calc)

## 2019-02-17 DIAGNOSIS — M5432 Sciatica, left side: Secondary | ICD-10-CM | POA: Insufficient documentation

## 2019-02-17 DIAGNOSIS — R300 Dysuria: Secondary | ICD-10-CM | POA: Insufficient documentation

## 2019-02-17 DIAGNOSIS — Z202 Contact with and (suspected) exposure to infections with a predominantly sexual mode of transmission: Secondary | ICD-10-CM | POA: Insufficient documentation

## 2019-02-17 LAB — URINE CULTURE
MICRO NUMBER:: 588521
Result:: NO GROWTH
SPECIMEN QUALITY:: ADEQUATE

## 2019-02-17 LAB — URINALYSIS, ROUTINE W REFLEX MICROSCOPIC
Bilirubin Urine: NEGATIVE
Glucose, UA: NEGATIVE
Hgb urine dipstick: NEGATIVE
Ketones, ur: NEGATIVE
Leukocytes,Ua: NEGATIVE
Nitrite: NEGATIVE
Protein, ur: NEGATIVE
Specific Gravity, Urine: 1.019 (ref 1.001–1.03)
pH: <=5 (ref 5.0–8.0)

## 2019-02-17 NOTE — Assessment & Plan Note (Addendum)
Urine is clear except for the presence of  protein and sugar.  Advised to limit  sweets   intake to once per day.   Will treat empirically for yeast with fluconazole while cultures pending

## 2019-04-24 ENCOUNTER — Other Ambulatory Visit: Payer: Self-pay

## 2019-04-24 ENCOUNTER — Encounter: Payer: Self-pay | Admitting: Family Medicine

## 2019-04-24 ENCOUNTER — Telehealth: Payer: Self-pay | Admitting: Internal Medicine

## 2019-04-24 ENCOUNTER — Ambulatory Visit (INDEPENDENT_AMBULATORY_CARE_PROVIDER_SITE_OTHER): Payer: BC Managed Care – PPO | Admitting: Family Medicine

## 2019-04-24 VITALS — Ht 70.0 in | Wt 225.0 lb

## 2019-04-24 DIAGNOSIS — B349 Viral infection, unspecified: Secondary | ICD-10-CM

## 2019-04-24 DIAGNOSIS — Z20828 Contact with and (suspected) exposure to other viral communicable diseases: Secondary | ICD-10-CM

## 2019-04-24 NOTE — Telephone Encounter (Signed)
Copied from [place] [id number]. Topic: Quick Communication - Rx Refill/Question >> Apr 24, 2019 12:39 PM Rainey Pines A wrote: Medication:albuterol (Patient stated that medication was mentioned during visit and he would like to proceed with getting it filled.)  Has the patient contacted their pharmacy? {Yes (Agent: If no, request that the patient contact the pharmacy for the refill.) (Agent: If yes, when and what did the pharmacy advise?)Contact PCP  Preferred Pharmacy (with phone number or street name): [id number] - [place], [place] [address] [phone] (Phone) [phone] (Fax)    Agent: Please be advised that RX refills may take up to 3 business days. We ask that you follow-up with your pharmacy.

## 2019-04-24 NOTE — Telephone Encounter (Signed)
Denied refill on albuterol.   I see no history of rx or of diagnosis of asthma or COPD

## 2019-04-24 NOTE — Progress Notes (Signed)
Patient ID: [name], male   DOB: 1965-12-05, 53 y.o.   MRN: [id number]    Virtual Visit via video Note  This visit type was conducted due to national recommendations for restrictions regarding the COVID-19 pandemic (e.g. social distancing).  This format is felt to be most appropriate for this patient at this time.  All issues noted in this document were discussed and addressed.  No physical exam was performed (except for noted visual exam findings with Video Visits).   I connected with [name] today at 10:40 AM EDT by a video enabled telemedicine application and verified that I am speaking with the correct person using two identifiers. Location patient: home Location provider: work or home office Persons participating in the virtual visit: patient, provider  I discussed the limitations, risks, security and privacy concerns of performing an evaluation and management service by video and the availability of in person appointments. I also discussed with the patient that there may be a patient responsible charge related to this service. The patient expressed understanding and agreed to proceed.   HPI:   Patient and I connected via video to discuss some nauseousness, chest congestion sinus pressure across forehead and general feeling of unwell for the past 3 to 4 days.  States he has a dry cough at times.  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  Patient states "I do not believe I have coronavirus; I think its a sinus infection" he has not been around anyone who is been tested for COVID-19 or who has been confirmed positive COVID-19.  He has been working from home, does not go many places living grocery store and has been wearing a mask and doing good handwashing when at home.  States he took a decongestant tablet yesterday, but did not notice much help in reducing congestion.  Does not regularly take any sort of Flonase nasal  spray/allergy nasal spray, oral antihistamine or do any saline nasal rinses.  Denies fever or chills.  Denies feeling wheezy or unable to catch his breath, states chest feels little congested and scratchy at times but would not describe it as difficulty breathing.  Denies pain in chest or heart palpitations.  Denies lower extremity swelling.  Denies vomiting or diarrhea.   ROS: See pertinent positives and negatives per HPI.  Past Medical History:  Diagnosis Date   Allergy    Depression    Sleep apnea     Past Surgical History:  Procedure Laterality Date   FOOT SURGERY Right 02/14   cyst removed   SPINE SURGERY  2004   nerve damage in spine   TOE AMPUTATION Right 06/30/2016   5 toes    Family History  Problem Relation Age of Onset   Heart disease Father    Stroke Father    Heart attack Father 47   Heart disease Maternal Grandmother    Stroke Maternal Grandmother    Heart disease Paternal Grandfather    Stroke Paternal Grandfather     Social History   Tobacco Use   Smoking status: Never Smoker   Smokeless tobacco: Never Used  Substance Use Topics   Alcohol use: No    Current Outpatient Medications:    acyclovir (ZOVIRAX) 400 MG tablet, Take 1 tablet by mouth as needed., Disp: , Rfl: 0   Ascorbic Acid (VITAMIN C) 100 MG tablet, Take 500 mg by mouth daily., Disp: , Rfl:    fluconazole (DIFLUCAN) 150 MG  tablet, Take 1 tablet (150 mg total) by mouth daily., Disp: 3 tablet, Rfl: 0   Multiple Vitamins-Minerals (MULTIVITAMIN WITH MINERALS) tablet, Take 1 tablet by mouth daily., Disp: , Rfl:    pravastatin (PRAVACHOL) 20 MG tablet, Take 1 tablet (20 mg total) by mouth daily., Disp: 90 tablet, Rfl: 3   predniSONE (DELTASONE) 10 MG tablet, 6 tablets on Day 1 , then reduce by 1 tablet daily until gone, Disp: 21 tablet, Rfl: 0   pregabalin (LYRICA) 75 MG capsule, TAKE 1 CAPSULE BY MOUTH THREE TIMES A DAY, Disp: 90 capsule, Rfl: 5   tiZANidine (ZANAFLEX) 4  MG tablet, Take 1 tablet (4 mg total) by mouth every 6 (six) hours as needed for muscle spasms., Disp: 30 tablet, Rfl: 0  EXAM:  GENERAL: alert, oriented, appears well and in no acute distress  HEENT: atraumatic, conjunttiva clear, no obvious abnormalities on inspection of external nose and ears  NECK: normal movements of the head and neck  LUNGS: on inspection no signs of respiratory distress, breathing rate appears normal, no obvious gross SOB, gasping or wheezing  CV: no obvious cyanosis  MS: moves all visible extremities without noticeable abnormality  PSYCH/NEURO: pleasant and cooperative, no obvious depression or anxiety, speech and thought processing grossly intact  ASSESSMENT AND PLAN:  Discussed the following assessment and plan:  Viral illness - Long discussion with patient over video in regards to his symptoms seem consistent with some sort of viral upper respiratory/common cold and potentially could be COVID-19.  Patient is very resistant to going to get tested for COVID-19 as he does not believe this is the cause of his symptoms.  Explained to patient that COVID-19 as well as multiple other viruses can range anywhere in extremely mild symptoms to very severe symptoms.  The only treatment we have right now to help [place] better confirm whether or not his symptoms are related to COVID-19 RD COVID-19 testing swabs. Explained to patient that 3 to 4 days of some sinus congestion does not warrant a sinusitis diagnosis & I do not think antibiotics are needed.  Also advised patient that I strongly recommend he begin taking a daily antihistamine such as Claritin or Allegra, using Mucinex twice a day to help both decongest and calm down dry cough and keep up good fluid intake while getting plenty of rest.  Advised patient that most viral illnesses take anywhere from 7 to 14 days to fully run course.  Recommend lots of fluids, Tylenol if needed for any aches and pains.  Patient made aware that if  he changes mind about getting tested for COVID-19, he can go to the drive up testing location located at the [facility] at [address] in [place].  Discussed diligent handwashing and wearing a mask when outside of the home.  Advised patient that I do recommend he remain home throughout his illness to avoid spreading to others.    I discussed the assessment and treatment plan with the patient. The patient was provided an opportunity to ask questions and all were answered. The patient agreed with the plan and demonstrated an understanding of the instructions.   The patient was advised to call back or seek an in-person evaluation if the symptoms worsen or if the condition fails to improve as anticipated.  I provided 15 minutes of video-face-to-face time during this encounter.   Jodelle Green, FNP

## 2019-04-24 NOTE — Telephone Encounter (Signed)
Albuterol is not on current medication list.

## 2019-04-25 ENCOUNTER — Encounter: Payer: Self-pay | Admitting: Family Medicine

## 2019-04-25 MED ORDER — ALBUTEROL SULFATE HFA 108 (90 BASE) MCG/ACT IN AERS: 2.0000 | INHALATION_SPRAY | Freq: Four times a day (QID) | RESPIRATORY_TRACT | 0 refills | Status: DC | PRN

## 2019-04-25 NOTE — Telephone Encounter (Signed)
Patient was seen by Philis Nettle 04/24/2019 and states NP recommend albuterol and at the time he wanted to think about it, patient would like a follow up call regarding the status, please advise best #  [phone]

## 2019-04-25 NOTE — Telephone Encounter (Signed)
Albuterol sent into pharmacy to use PRN

## 2019-04-25 NOTE — Telephone Encounter (Signed)
Called Pt No answer left VM to call office back. 

## 2019-04-26 ENCOUNTER — Other Ambulatory Visit: Payer: Self-pay | Admitting: Lab

## 2019-04-26 NOTE — Telephone Encounter (Signed)
Pt notified of Rx sent in

## 2019-05-02 DIAGNOSIS — E1140 Type 2 diabetes mellitus with diabetic neuropathy, unspecified: Secondary | ICD-10-CM | POA: Diagnosis not present

## 2019-05-02 DIAGNOSIS — E11621 Type 2 diabetes mellitus with foot ulcer: Secondary | ICD-10-CM | POA: Diagnosis not present

## 2019-05-02 DIAGNOSIS — Z89431 Acquired absence of right foot: Secondary | ICD-10-CM | POA: Diagnosis not present

## 2019-05-02 DIAGNOSIS — L97512 Non-pressure chronic ulcer of other part of right foot with fat layer exposed: Secondary | ICD-10-CM | POA: Diagnosis not present

## 2019-05-13 ENCOUNTER — Other Ambulatory Visit: Payer: Self-pay | Admitting: Family Medicine

## 2019-05-13 DIAGNOSIS — B349 Viral infection, unspecified: Secondary | ICD-10-CM

## 2019-05-27 DIAGNOSIS — R200 Anesthesia of skin: Secondary | ICD-10-CM | POA: Diagnosis not present

## 2019-05-27 DIAGNOSIS — E519 Thiamine deficiency, unspecified: Secondary | ICD-10-CM | POA: Diagnosis not present

## 2019-05-27 DIAGNOSIS — Z79899 Other long term (current) drug therapy: Secondary | ICD-10-CM | POA: Diagnosis not present

## 2019-05-27 DIAGNOSIS — E531 Pyridoxine deficiency: Secondary | ICD-10-CM | POA: Diagnosis not present

## 2019-05-27 DIAGNOSIS — E538 Deficiency of other specified B group vitamins: Secondary | ICD-10-CM | POA: Diagnosis not present

## 2019-05-27 DIAGNOSIS — G609 Hereditary and idiopathic neuropathy, unspecified: Secondary | ICD-10-CM | POA: Diagnosis not present

## 2019-05-27 DIAGNOSIS — E559 Vitamin D deficiency, unspecified: Secondary | ICD-10-CM | POA: Diagnosis not present

## 2019-06-07 DIAGNOSIS — Z89431 Acquired absence of right foot: Secondary | ICD-10-CM | POA: Diagnosis not present

## 2019-06-07 DIAGNOSIS — G609 Hereditary and idiopathic neuropathy, unspecified: Secondary | ICD-10-CM | POA: Diagnosis not present

## 2019-06-11 DIAGNOSIS — Z89431 Acquired absence of right foot: Secondary | ICD-10-CM | POA: Diagnosis not present

## 2019-06-11 DIAGNOSIS — G609 Hereditary and idiopathic neuropathy, unspecified: Secondary | ICD-10-CM | POA: Diagnosis not present

## 2019-06-24 DIAGNOSIS — R768 Other specified abnormal immunological findings in serum: Secondary | ICD-10-CM | POA: Diagnosis not present

## 2019-07-02 DIAGNOSIS — R200 Anesthesia of skin: Secondary | ICD-10-CM | POA: Diagnosis not present

## 2019-07-13 DIAGNOSIS — Z23 Encounter for immunization: Secondary | ICD-10-CM | POA: Diagnosis not present

## 2019-07-18 DIAGNOSIS — E1140 Type 2 diabetes mellitus with diabetic neuropathy, unspecified: Secondary | ICD-10-CM | POA: Diagnosis not present

## 2019-07-18 DIAGNOSIS — L97512 Non-pressure chronic ulcer of other part of right foot with fat layer exposed: Secondary | ICD-10-CM | POA: Diagnosis not present

## 2019-07-18 DIAGNOSIS — M216X1 Other acquired deformities of right foot: Secondary | ICD-10-CM | POA: Diagnosis not present

## 2019-07-18 DIAGNOSIS — E11621 Type 2 diabetes mellitus with foot ulcer: Secondary | ICD-10-CM | POA: Diagnosis not present

## 2019-09-02 DIAGNOSIS — E11621 Type 2 diabetes mellitus with foot ulcer: Secondary | ICD-10-CM | POA: Diagnosis not present

## 2019-09-02 DIAGNOSIS — M216X1 Other acquired deformities of right foot: Secondary | ICD-10-CM | POA: Diagnosis not present

## 2019-09-02 DIAGNOSIS — Z20828 Contact with and (suspected) exposure to other viral communicable diseases: Secondary | ICD-10-CM | POA: Diagnosis not present

## 2019-09-02 DIAGNOSIS — L97512 Non-pressure chronic ulcer of other part of right foot with fat layer exposed: Secondary | ICD-10-CM | POA: Diagnosis not present

## 2019-09-02 DIAGNOSIS — Z20822 Contact with and (suspected) exposure to covid-19: Secondary | ICD-10-CM | POA: Diagnosis not present

## 2019-09-04 DIAGNOSIS — E1140 Type 2 diabetes mellitus with diabetic neuropathy, unspecified: Secondary | ICD-10-CM | POA: Diagnosis not present

## 2019-09-04 DIAGNOSIS — Z881 Allergy status to other antibiotic agents status: Secondary | ICD-10-CM | POA: Diagnosis not present

## 2019-09-04 DIAGNOSIS — E669 Obesity, unspecified: Secondary | ICD-10-CM | POA: Diagnosis not present

## 2019-09-04 DIAGNOSIS — Z79899 Other long term (current) drug therapy: Secondary | ICD-10-CM | POA: Diagnosis not present

## 2019-09-04 DIAGNOSIS — M19071 Primary osteoarthritis, right ankle and foot: Secondary | ICD-10-CM | POA: Diagnosis not present

## 2019-09-04 DIAGNOSIS — Z89431 Acquired absence of right foot: Secondary | ICD-10-CM | POA: Diagnosis not present

## 2019-09-04 DIAGNOSIS — E11621 Type 2 diabetes mellitus with foot ulcer: Secondary | ICD-10-CM | POA: Diagnosis not present

## 2019-09-04 DIAGNOSIS — L97512 Non-pressure chronic ulcer of other part of right foot with fat layer exposed: Secondary | ICD-10-CM | POA: Diagnosis not present

## 2019-09-04 DIAGNOSIS — Z6834 Body mass index (BMI) 34.0-34.9, adult: Secondary | ICD-10-CM | POA: Diagnosis not present

## 2019-09-04 DIAGNOSIS — T8789 Other complications of amputation stump: Secondary | ICD-10-CM | POA: Diagnosis not present

## 2019-09-04 DIAGNOSIS — M86671 Other chronic osteomyelitis, right ankle and foot: Secondary | ICD-10-CM | POA: Diagnosis not present

## 2019-09-04 DIAGNOSIS — G4730 Sleep apnea, unspecified: Secondary | ICD-10-CM | POA: Diagnosis not present

## 2019-09-04 DIAGNOSIS — E1169 Type 2 diabetes mellitus with other specified complication: Secondary | ICD-10-CM | POA: Diagnosis not present

## 2019-09-04 DIAGNOSIS — Z560 Unemployment, unspecified: Secondary | ICD-10-CM | POA: Diagnosis not present

## 2019-09-09 ENCOUNTER — Telehealth: Payer: Self-pay | Admitting: Cardiovascular Disease

## 2019-09-09 NOTE — Telephone Encounter (Signed)
Patient was told by another provider he had a heart valve issue.  Patient was not aware of this .  Please call to discuss.

## 2019-09-10 NOTE — Telephone Encounter (Signed)
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 Reviewed that we last saw him in September 2019 and I do not see any mention in provider notes. Also reviewed that we have not had any sort of testing for this unless he had something done at a outside facility. He reports that he will try to obtain records and I did offer for him to come in and follow up with provider. He was agreeable with that plan and had no further questions at this time. Advised that I would have scheduling call to assist with appointment and then he can review all information with provider.

## 2019-09-10 NOTE — Telephone Encounter (Signed)
Left voicemail message to call back  

## 2019-09-10 NOTE — Telephone Encounter (Signed)
Patient returning call.

## 2019-09-12 DIAGNOSIS — R768 Other specified abnormal immunological findings in serum: Secondary | ICD-10-CM | POA: Diagnosis not present

## 2019-09-12 DIAGNOSIS — L97512 Non-pressure chronic ulcer of other part of right foot with fat layer exposed: Secondary | ICD-10-CM | POA: Diagnosis not present

## 2019-09-12 DIAGNOSIS — M898X9 Other specified disorders of bone, unspecified site: Secondary | ICD-10-CM | POA: Diagnosis not present

## 2019-09-12 DIAGNOSIS — E11621 Type 2 diabetes mellitus with foot ulcer: Secondary | ICD-10-CM | POA: Diagnosis not present

## 2019-09-30 DIAGNOSIS — L97512 Non-pressure chronic ulcer of other part of right foot with fat layer exposed: Secondary | ICD-10-CM | POA: Diagnosis not present

## 2019-09-30 DIAGNOSIS — R768 Other specified abnormal immunological findings in serum: Secondary | ICD-10-CM | POA: Diagnosis not present

## 2019-09-30 DIAGNOSIS — E11621 Type 2 diabetes mellitus with foot ulcer: Secondary | ICD-10-CM | POA: Diagnosis not present

## 2019-10-10 ENCOUNTER — Other Ambulatory Visit: Payer: Self-pay | Admitting: Internal Medicine

## 2019-10-16 DIAGNOSIS — L97512 Non-pressure chronic ulcer of other part of right foot with fat layer exposed: Secondary | ICD-10-CM | POA: Diagnosis not present

## 2019-10-16 DIAGNOSIS — R768 Other specified abnormal immunological findings in serum: Secondary | ICD-10-CM | POA: Diagnosis not present

## 2019-10-16 DIAGNOSIS — E11621 Type 2 diabetes mellitus with foot ulcer: Secondary | ICD-10-CM | POA: Diagnosis not present

## 2019-10-16 DIAGNOSIS — Z89431 Acquired absence of right foot: Secondary | ICD-10-CM | POA: Diagnosis not present

## 2019-10-16 MED ORDER — PREGABALIN 75 MG PO CAPS: ORAL_CAPSULE | ORAL | 5 refills | Status: DC

## 2019-10-29 ENCOUNTER — Ambulatory Visit: Payer: BC Managed Care – PPO | Admitting: Cardiovascular Disease

## 2019-11-04 DIAGNOSIS — E11621 Type 2 diabetes mellitus with foot ulcer: Secondary | ICD-10-CM | POA: Diagnosis not present

## 2019-11-04 DIAGNOSIS — R768 Other specified abnormal immunological findings in serum: Secondary | ICD-10-CM | POA: Diagnosis not present

## 2019-11-04 DIAGNOSIS — L97512 Non-pressure chronic ulcer of other part of right foot with fat layer exposed: Secondary | ICD-10-CM | POA: Diagnosis not present

## 2019-11-04 DIAGNOSIS — Z89431 Acquired absence of right foot: Secondary | ICD-10-CM | POA: Diagnosis not present

## 2019-11-07 DIAGNOSIS — E11621 Type 2 diabetes mellitus with foot ulcer: Secondary | ICD-10-CM | POA: Diagnosis not present

## 2019-11-07 DIAGNOSIS — S98321D Partial traumatic amputation of right midfoot, subsequent encounter: Secondary | ICD-10-CM | POA: Diagnosis not present

## 2019-11-27 ENCOUNTER — Encounter: Payer: Self-pay | Admitting: Internal Medicine

## 2019-11-27 ENCOUNTER — Other Ambulatory Visit: Payer: Self-pay

## 2019-11-27 ENCOUNTER — Ambulatory Visit (INDEPENDENT_AMBULATORY_CARE_PROVIDER_SITE_OTHER): Payer: BC Managed Care – PPO | Admitting: Internal Medicine

## 2019-11-27 VITALS — BP 110/66 | HR 83 | Temp 97.4°F | Resp 15 | Ht 70.0 in | Wt 238.2 lb

## 2019-11-27 DIAGNOSIS — R768 Other specified abnormal immunological findings in serum: Secondary | ICD-10-CM | POA: Diagnosis not present

## 2019-11-27 DIAGNOSIS — G4733 Obstructive sleep apnea (adult) (pediatric): Secondary | ICD-10-CM

## 2019-11-27 DIAGNOSIS — E1142 Type 2 diabetes mellitus with diabetic polyneuropathy: Secondary | ICD-10-CM | POA: Diagnosis not present

## 2019-11-27 DIAGNOSIS — E11621 Type 2 diabetes mellitus with foot ulcer: Secondary | ICD-10-CM | POA: Diagnosis not present

## 2019-11-27 DIAGNOSIS — D582 Other hemoglobinopathies: Secondary | ICD-10-CM | POA: Diagnosis not present

## 2019-11-27 DIAGNOSIS — M86671 Other chronic osteomyelitis, right ankle and foot: Secondary | ICD-10-CM

## 2019-11-27 DIAGNOSIS — Z125 Encounter for screening for malignant neoplasm of prostate: Secondary | ICD-10-CM | POA: Diagnosis not present

## 2019-11-27 DIAGNOSIS — E1169 Type 2 diabetes mellitus with other specified complication: Secondary | ICD-10-CM | POA: Diagnosis not present

## 2019-11-27 DIAGNOSIS — E669 Obesity, unspecified: Secondary | ICD-10-CM | POA: Diagnosis not present

## 2019-11-27 DIAGNOSIS — R5383 Other fatigue: Secondary | ICD-10-CM

## 2019-11-27 DIAGNOSIS — L89894 Pressure ulcer of other site, stage 4: Secondary | ICD-10-CM

## 2019-11-27 DIAGNOSIS — Z89431 Acquired absence of right foot: Secondary | ICD-10-CM | POA: Diagnosis not present

## 2019-11-27 DIAGNOSIS — E785 Hyperlipidemia, unspecified: Secondary | ICD-10-CM

## 2019-11-27 DIAGNOSIS — L97512 Non-pressure chronic ulcer of other part of right foot with fat layer exposed: Secondary | ICD-10-CM | POA: Diagnosis not present

## 2019-11-27 LAB — COMPREHENSIVE METABOLIC PANEL
ALT: 53 U/L (ref 0–53)
AST: 45 U/L — ABNORMAL HIGH (ref 0–37)
Albumin: 4.4 g/dL (ref 3.5–5.2)
Alkaline Phosphatase: 48 U/L (ref 39–117)
BUN: 17 mg/dL (ref 6–23)
CO2: 28 mEq/L (ref 19–32)
Calcium: 9 mg/dL (ref 8.4–10.5)
Chloride: 102 mEq/L (ref 96–112)
Creatinine, Ser: 0.85 mg/dL (ref 0.40–1.50)
GFR: 93.48 mL/min (ref >60.00)
Glucose, Bld: 147 mg/dL — ABNORMAL HIGH (ref 70–99)
Potassium: 4.2 mEq/L (ref 3.5–5.1)
Sodium: 137 mEq/L (ref 135–145)
Total Bilirubin: 0.8 mg/dL (ref 0.2–1.2)
Total Protein: 7.4 g/dL (ref 6.0–8.3)

## 2019-11-27 LAB — CBC WITH DIFFERENTIAL/PLATELET
Basophils Absolute: 0.1 10*3/uL (ref 0.0–0.1)
Basophils Relative: 1.1 % (ref 0.0–3.0)
Eosinophils Absolute: 0.3 10*3/uL (ref 0.0–0.7)
Eosinophils Relative: 4.5 % (ref 0.0–5.0)
HCT: 50.8 % (ref 39.0–52.0)
Hemoglobin: 17.5 g/dL — ABNORMAL HIGH (ref 13.0–17.0)
Lymphocytes Relative: 24.7 % (ref 12.0–46.0)
Lymphs Abs: 1.6 10*3/uL (ref 0.7–4.0)
MCHC: 34.4 g/dL (ref 30.0–36.0)
MCV: 92.4 fl (ref 78.0–100.0)
Monocytes Absolute: 0.9 10*3/uL (ref 0.1–1.0)
Monocytes Relative: 14 % — ABNORMAL HIGH (ref 3.0–12.0)
Neutro Abs: 3.6 10*3/uL (ref 1.4–7.7)
Neutrophils Relative %: 55.7 % (ref 43.0–77.0)
Platelets: 191 10*3/uL (ref 150.0–400.0)
RBC: 5.5 Mil/uL (ref 4.22–5.81)
RDW: 14.6 % (ref 11.5–15.5)
WBC: 6.4 10*3/uL (ref 4.0–10.5)

## 2019-11-27 LAB — HEMOGLOBIN A1C: Hgb A1c MFr Bld: 5.8 % (ref 4.6–6.5)

## 2019-11-27 LAB — LIPID PANEL
Cholesterol: 186 mg/dL (ref 0–200)
HDL: 33.9 mg/dL — ABNORMAL LOW (ref >39.00)
NonHDL: 152.54
Total CHOL/HDL Ratio: 5
Triglycerides: 222 mg/dL — ABNORMAL HIGH (ref 0.0–149.0)
VLDL: 44.4 mg/dL — ABNORMAL HIGH (ref 0.0–40.0)

## 2019-11-27 LAB — PSA: PSA: 0.29 ng/mL (ref 0.10–4.00)

## 2019-11-27 LAB — IBC + FERRITIN
Ferritin: 130.6 ng/mL (ref 22.0–322.0)
Iron: 106 ug/dL (ref 42–165)
Saturation Ratios: 25 % (ref 20.0–50.0)
Transferrin: 303 mg/dL (ref 212.0–360.0)

## 2019-11-27 LAB — LDL CHOLESTEROL, DIRECT: Direct LDL: 121 mg/dL

## 2019-11-27 NOTE — Progress Notes (Signed)
Subjective:  Patient ID: [name], male    DOB: Oct 15, 1965  Age: 54 y.o. MRN: [id number]  CC: The primary encounter diagnosis was Elevated hemoglobin ([place]). Diagnoses of Prostate cancer screening, Diabetic polyneuropathy associated with type 2 diabetes mellitus ([place]), Diabetes mellitus type 2 in obese (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  HPI [name] presents for FOLLOWUP on multiple issues including type 2 DM with neuropathy, history of osteomyelitis secondary to pressure ulcers,  S/p ray  amputation , hyperlipidemia,  Obesity and untreated OSA.  This visit occurred during the SARS-CoV-2 public health emergency.  Safety protocols were in place, including screening questions prior to the visit, additional usage of staff PPE, and extensive cleaning of exam room while observing appropriate contact time as indicated for disinfecting solutions.    Patient has not received any  doses of the available COVID 19 vaccine .  Patient continues to mask when outside of the home except when walking in yard or at safe distances from others .  Patient denies any change in mood or development of unhealthy behaviors resuting from the pandemic's restriction of activities and socialization.     Feet nearly healed . One small linear ulcer on right foot: Has podiatry follow up today. 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   Patient does NOT WANT his information discussed with his wife Santiago Glad.  Unless it is an emergency  Untreated sleep apnea  Elevated hgb,  Used to donate blood   Noticing more dyspnea with exertion now that he is more active .  Has been  sedentary for > 5 years.      Outpatient Medications Prior to Visit  Medication Sig Dispense Refill  . acyclovir (ZOVIRAX) 400 MG tablet Take 1 tablet by mouth as needed.  0  . albuterol (VENTOLIN HFA) 108 (90 Base) MCG/ACT inhaler TAKE 2 PUFFS BY MOUTH EVERY 6 HOURS AS NEEDED FOR WHEEZE OR SHORTNESS OF BREATH 8 g 0  . Ascorbic Acid (VITAMIN C) 100 MG tablet Take 500 mg by mouth daily.    . Multiple Vitamins-Minerals (MULTIVITAMIN WITH MINERALS) tablet Take 1 tablet by mouth daily.    . pregabalin (LYRICA) 75 MG capsule TAKE 1 CAPSULE BY MOUTH THREE TIMES A DAY 90 capsule 5  . tiZANidine (ZANAFLEX) 4 MG tablet Take 1 tablet (4 mg total) by mouth every 6 (six) hours as needed for muscle spasms. 30 tablet 0  . pravastatin (PRAVACHOL) 20 MG tablet Take 1 tablet (20 mg total) by mouth daily. 90 tablet 3  . fluconazole (DIFLUCAN) 150 MG tablet Take 1 tablet (150 mg total) by mouth daily. (Patient not taking: Reported on 11/27/2019) 3 tablet 0  . predniSONE (DELTASONE) 10 MG tablet 6 tablets on Day 1 , then reduce by 1 tablet daily until gone 21 tablet 0   No facility-administered medications prior to visit.    Review of Systems;  Patient denies headache, fevers, malaise, unintentional weight loss, skin rash, eye pain, sinus congestion and sinus pain, sore throat, dysphagia,  hemoptysis , cough, dyspnea, wheezing, chest pain, palpitations, orthopnea, edema, abdominal pain, nausea, melena, diarrhea, constipation, flank pain, dysuria, hematuria, urinary  Frequency, nocturia, numbness,  tingling, seizures,  Focal weakness, Loss of consciousness,  Tremor, insomnia, depression, anxiety, and suicidal ideation.      Objective:  BP 110/66 (BP Location: Left Arm, Patient Position: Sitting, Cuff Size: Large)   Pulse 83   Temp (!) 97.4 F (36.3 C) (Temporal)   Resp 15   Ht 5\' 10"  (1.778 m)   Wt 238 lb 3.2 oz (108 kg)   SpO2 96%   BMI 34.18 kg/m   BP Readings from Last 3 Encounters:  11/27/19  110/66  09/20/18 110/70  06/20/18 (!) 116/58    Wt Readings from Last 3 Encounters:  11/27/19 238 lb 3.2 oz (108 kg)  04/24/19 225 lb (102.1 kg)  09/20/18 229 lb 12.8 oz (104.2 kg)    General appearance: alert, cooperative and appears stated age Ears: normal TM's and external ear canals both ears Throat: lips, mucosa, and tongue normal; teeth and gums normal Neck: no adenopathy, no carotid bruit, supple, symmetrical, trachea midline and thyroid not enlarged, symmetric, no tenderness/mass/nodules Back: symmetric, no curvature. ROM normal. No CVA tenderness. Lungs: clear to auscultation bilaterally Heart: regular rate and rhythm, S1, S2 normal, no murmur, click, rub or gallop Abdomen: soft, non-tender; bowel sounds normal; no masses,  no organomegaly Pulses: 2+ and symmetric Skin: Skin color, texture, turgor normal. No rashes or lesions Lymph nodes: Cervical, supraclavicular, and axillary nodes normal.  Lab Results  Component Value Date   HGBA1C 5.8 11/27/2019   HGBA1C 5.1 02/15/2019   HGBA1C 5.4 09/13/2018    Lab Results  Component Value Date   CREATININE 0.85 11/27/2019   CREATININE 0.89 02/15/2019   CREATININE 0.98 09/13/2018    Lab Results  Component Value Date   WBC 6.4 11/27/2019   HGB 17.5 (H) 11/27/2019   HCT 50.8 11/27/2019   PLT 191.0 11/27/2019   GLUCOSE 147 (H) 11/27/2019   CHOL 186 11/27/2019   TRIG 222.0 (H) 11/27/2019   HDL 33.90 (L) 11/27/2019   LDLDIRECT 121.0 11/27/2019   LDLCALC 106 (H) 09/13/2018   ALT 53 11/27/2019   AST 45 (H) 11/27/2019   NA 137 11/27/2019   K 4.2 11/27/2019   CL 102 11/27/2019   CREATININE 0.85 11/27/2019   BUN 17 11/27/2019   CO2 28 11/27/2019   TSH 0.78 12/04/2015   PSA 0.29 11/27/2019   INR 1.3 (A) 04/08/2017   HGBA1C 5.8 11/27/2019   MICROALBUR 2.3 (H) 06/12/2018    No results found.  Assessment & Plan:   Problem List Items Addressed This Visit      Unprioritized   Chronic refractory osteomyelitis of  right foot ([place])   Diabetic neuropathy associated with type 2 diabetes mellitus ([place])    With multiple amputations over the past several years due to osteomyelitis.  He is complying with directions from podiatry       OSA (obstructive sleep apnea)    Untreated due to noncoverage by insurance in 2018.  Likely the cause of his polycythemia      Obesity (BMI 30-39.9)    I have congratulated him in starting an exercise program and adhering  To  a low glycemic index diet       Diabetes mellitus type 2 in obese (HCC)     Historically well-controlled on diet alone .  hemoglobin A1c has been consistently at or  less than 7.0 . Patient is up-to-date on eye exams and foot exam is is chronically abnormal and managed bo podiatry Patient has minima l microalbuminuria. Patient is tolerating statin  therapy for CAD risk reduction and has no history of hypertension  And will not tolerate  ACE/ARB for renal protection   Lab Results  Component Value Date   HGBA1C 5.8 11/27/2019   Lab Results  Component Value Date   MICROALBUR 2.3 (H) 06/12/2018   MICROALBUR 3.1 (H) 10/09/2017           Relevant Orders   Hemoglobin A1c (Completed)   Lipid panel (Completed)   Direct LDL (Completed)   Comprehensive metabolic panel (Completed)   Hyperlipidemia associated with type 2 diabetes mellitus ([place])    Managed with pravastatin. 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, stage 4 (HCC)    Continue off loading and follow up with podiatry        Other Visit Diagnoses    Elevated hemoglobin ([place])    -  Primary   Relevant Orders   CBC with Differential/Platelet (Completed)   IBC + Ferritin (Completed)   Prostate  cancer screening       Relevant Orders   PSA (Completed)   Fatigue, unspecified type       Relevant Orders   B12 and Folate Panel      I provided  30 minutes of  face-to-face time during this encounter reviewing patient's current problems and past surgeries, labs and imaging studies, providing counseling on the above mentioned problems , and coordination  of care .  I have discontinued Axten C. Petkus's fluconazole and predniSONE. I am also having him maintain his multivitamin with minerals, vitamin C, acyclovir, tiZANidine, albuterol, and pregabalin.  No orders of the defined types were placed in this encounter.   Medications Discontinued During This Encounter  Medication Reason  . predniSONE (DELTASONE) 10 MG tablet Completed Course  . fluconazole (DIFLUCAN) 150 MG tablet Completed Course    Follow-up: Return in about 4 months (around 03/28/2020).   Crecencio Mc, MD

## 2019-11-27 NOTE — Patient Instructions (Signed)
[email or web address]  [phone] [facility] PHONE NUMBER  CALL [facility]    [email or web address]

## 2019-11-29 ENCOUNTER — Other Ambulatory Visit: Payer: Self-pay | Admitting: Internal Medicine

## 2019-11-29 DIAGNOSIS — E1169 Type 2 diabetes mellitus with other specified complication: Secondary | ICD-10-CM

## 2019-11-29 DIAGNOSIS — E785 Hyperlipidemia, unspecified: Secondary | ICD-10-CM

## 2019-11-29 DIAGNOSIS — L89894 Pressure ulcer of other site, stage 4: Secondary | ICD-10-CM | POA: Insufficient documentation

## 2019-11-29 DIAGNOSIS — E08621 Diabetes mellitus due to underlying condition with foot ulcer: Secondary | ICD-10-CM | POA: Insufficient documentation

## 2019-11-29 DIAGNOSIS — E119 Type 2 diabetes mellitus without complications: Secondary | ICD-10-CM

## 2019-11-29 DIAGNOSIS — L97502 Non-pressure chronic ulcer of other part of unspecified foot with fat layer exposed: Secondary | ICD-10-CM | POA: Insufficient documentation

## 2019-11-29 DIAGNOSIS — D751 Secondary polycythemia: Secondary | ICD-10-CM

## 2019-11-29 DIAGNOSIS — D45 Polycythemia vera: Secondary | ICD-10-CM

## 2019-11-29 MED ORDER — PRAVASTATIN SODIUM 40 MG PO TABS: 40.0000 mg | ORAL_TABLET | Freq: Every day | ORAL | 1 refills | Status: DC

## 2019-11-29 NOTE — Assessment & Plan Note (Signed)
Historically well-controlled on diet alone .  hemoglobin A1c has been consistently at or  less than 7.0 . Patient is up-to-date on eye exams and foot exam is is chronically abnormal and managed bo podiatry Patient has minima l microalbuminuria. Patient is tolerating statin therapy for CAD risk reduction and has no history of hypertension  And will not tolerate  ACE/ARB for renal protection   Lab Results  Component Value Date   HGBA1C 5.8 11/27/2019   Lab Results  Component Value Date   MICROALBUR 2.3 (H) 06/12/2018   MICROALBUR 3.1 (H) 10/09/2017

## 2019-11-29 NOTE — Assessment & Plan Note (Addendum)
Managed with pravastatin. LdL is not at goal ; will increase dose of pravastatin to 40 mg daily for goal < 100  Lab Results  Component Value Date   CHOL 186 11/27/2019   HDL 33.90 (L) 11/27/2019   LDLCALC 106 (H) 09/13/2018   LDLDIRECT 121.0 11/27/2019   TRIG 222.0 (H) 11/27/2019   CHOLHDL 5 11/27/2019

## 2019-11-29 NOTE — Assessment & Plan Note (Signed)
With multiple amputations over the past several years due to osteomyelitis.  He is complying with directions from podiatry

## 2019-11-29 NOTE — Assessment & Plan Note (Signed)
I have congratulated him in starting an exercise program and adhering  To  a low glycemic index diet

## 2019-11-29 NOTE — Assessment & Plan Note (Signed)
Untreated due to noncoverage by insurance in 2018.  Likely the cause of his polycythemia

## 2019-11-29 NOTE — Assessment & Plan Note (Signed)
Improving with treatment of depression and participation in a more active lifestyle  . Has untreated sleep apnea and B12 status unknown.

## 2019-11-29 NOTE — Assessment & Plan Note (Signed)
Continue off loading and follow up with podiatry

## 2020-02-10 DIAGNOSIS — E1140 Type 2 diabetes mellitus with diabetic neuropathy, unspecified: Secondary | ICD-10-CM | POA: Diagnosis not present

## 2020-02-10 DIAGNOSIS — L97512 Non-pressure chronic ulcer of other part of right foot with fat layer exposed: Secondary | ICD-10-CM | POA: Diagnosis not present

## 2020-02-10 DIAGNOSIS — R768 Other specified abnormal immunological findings in serum: Secondary | ICD-10-CM | POA: Diagnosis not present

## 2020-02-10 DIAGNOSIS — E11621 Type 2 diabetes mellitus with foot ulcer: Secondary | ICD-10-CM | POA: Diagnosis not present

## 2020-02-10 DIAGNOSIS — Z89431 Acquired absence of right foot: Secondary | ICD-10-CM | POA: Diagnosis not present

## 2020-04-13 DIAGNOSIS — E11621 Type 2 diabetes mellitus with foot ulcer: Secondary | ICD-10-CM | POA: Diagnosis not present

## 2020-04-13 DIAGNOSIS — R768 Other specified abnormal immunological findings in serum: Secondary | ICD-10-CM | POA: Diagnosis not present

## 2020-04-13 DIAGNOSIS — Z89431 Acquired absence of right foot: Secondary | ICD-10-CM | POA: Diagnosis not present

## 2020-04-13 DIAGNOSIS — L97512 Non-pressure chronic ulcer of other part of right foot with fat layer exposed: Secondary | ICD-10-CM | POA: Diagnosis not present

## 2020-04-17 ENCOUNTER — Encounter (INDEPENDENT_AMBULATORY_CARE_PROVIDER_SITE_OTHER): Payer: Self-pay

## 2020-04-23 ENCOUNTER — Encounter: Payer: Self-pay | Admitting: Internal Medicine

## 2020-04-23 ENCOUNTER — Telehealth (INDEPENDENT_AMBULATORY_CARE_PROVIDER_SITE_OTHER): Payer: BC Managed Care – PPO | Admitting: Internal Medicine

## 2020-04-23 DIAGNOSIS — E1142 Type 2 diabetes mellitus with diabetic polyneuropathy: Secondary | ICD-10-CM

## 2020-04-23 MED ORDER — OXYCODONE HCL 5 MG PO TABS: 5.0000 mg | ORAL_TABLET | Freq: Every evening | ORAL | 0 refills | Status: DC | PRN

## 2020-04-23 MED ORDER — PREGABALIN 75 MG PO CAPS
150.0000 mg | ORAL_CAPSULE | Freq: Three times a day (TID) | ORAL | 0 refills | Status: DC
Start: 2020-04-23 — End: 2020-05-08

## 2020-04-23 NOTE — Progress Notes (Signed)
Virtual Visit via [place]  This visit type was conducted due to national recommendations for restrictions regarding the COVID-19 pandemic (e.g. social distancing).  This format is felt to be most appropriate for this patient at this time.  All issues noted in this document were discussed and addressed.  No physical exam was performed (except for noted visual exam findings with Video Visits).   I connected with@ on 04/23/20 at 11:30 AM EDT by a video enabled telemedicine application  and verified that I am speaking with the correct person using two identifiers. Location patient: home Location provider: work or home office Persons participating in the virtual visit: patient, provider  I discussed the limitations, risks, security and privacy concerns of performing an evaluation and management service by telephone and the availability of in person appointments. I also discussed with the patient that there may be a patient responsible charge related to this service. The patient expressed understanding and agreed to proceed.  Reason for visit: severe foot pain secondary to diabetic neuropathy  HPI:  54 yr old male with type 2 diabetes mellitus with peripheral neuropathy complicated by recurrent pressure ulcers and osteomyelitis resulting in amputation presents with a 3 week history of severe intermittent foot pain .  he has been using Lyrica intermittently.  75 mg 3 to 4 times daily.  Pain is present during the day,  Worse at night,  He has at times had to take oxycodone to dull the pain enough to sleep.   Refill history confirmed via [place] Controlled Substance database, accessed by me today..last oxycodone rx was In January prescribed by his foot surgeon.  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 Denies swelling redness and warmth to foot.    Has not started using a knee scooter yet.  Still has an ulcer on plantar surface of right foot.  No plans for skin graft "until he can  stay off it." Weight gain of 12-15 lbs since last visit.  ROS: See pertinent positives and negatives per HPI.  Past Medical History:  Diagnosis Date  . Allergy   . Depression   . Sleep apnea     Past Surgical History:  Procedure Laterality Date  . FOOT SURGERY Right 02/14   cyst removed  . 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 TOE AMPUTATION Right 06/30/2016   5 toes    Family History  Problem Relation Age of Onset  . Heart disease Father   . Stroke Father   . Heart attack Father 15  . Heart disease Maternal Grandmother   . Stroke Maternal Grandmother   . Heart disease Paternal Grandfather   . Stroke Paternal Grandfather     SOCIAL HX:  reports that he has never smoked. He has never used smokeless tobacco. He reports that he does not drink alcohol and does not use drugs.   Current Outpatient Medications:  .  acyclovir (ZOVIRAX) 400 MG tablet, Take 1 tablet by mouth as needed., Disp: , Rfl: 0 .  albuterol (VENTOLIN HFA) 108 (90 Base) MCG/ACT inhaler, TAKE 2 PUFFS BY MOUTH EVERY 6 HOURS AS NEEDED FOR WHEEZE OR SHORTNESS OF BREATH, Disp: 8 g, Rfl: 0 .  Multiple Vitamins-Minerals (MULTIVITAMIN WITH MINERALS) tablet, Take 1 tablet by mouth daily., Disp: , Rfl:  .  pravastatin (PRAVACHOL) 40 MG tablet, Take 1 tablet (40 mg total) by mouth daily., Disp: 90 tablet, Rfl: 1 .  pregabalin (LYRICA) 75 MG  capsule, Take 2 capsules (150 mg total) by mouth 3 (three) times daily. TAKE 1 CAPSULE BY MOUTH THREE TIMES A DAY, Disp: 90 capsule, Rfl: 0 .  oxyCODONE (ROXICODONE) 5 MG immediate release tablet, Take 1 tablet (5 mg total) by mouth at bedtime as needed for severe pain., Disp: 30 tablet, Rfl: 0  EXAM:  VITALS per patient if applicable:  GENERAL: alert, oriented, appears well and in no acute distress  HEENT: atraumatic, conjunttiva clear, no obvious abnormalities on inspection of external nose and ears  NECK: normal movements of the head and neck  LUNGS: on inspection  no signs of respiratory distress, breathing rate appears normal, no obvious gross SOB, gasping or wheezing  CV: no obvious cyanosis  MS: moves all visible extremities without noticeable abnormality  PSYCH/NEURO: pleasant and cooperative, no obvious depression or anxiety, speech and thought processing grossly intact  ASSESSMENT AND PLAN:  Discussed the following assessment and plan:  Diabetic polyneuropathy associated with type 2 diabetes mellitus (HCC)  Diabetic neuropathy associated with type 2 diabetes mellitus ([place]) His pain has become severe.  Advised to start using lyrica regularly instead of prn and to gradually increase lyrica dose to 150 mg tid gradually .  I have refilled his oxycodone for use at bedtime if needed. Strongly advised to start using the ankle scooter to manage his pressure ulcer     I discussed the assessment and treatment plan with the patient. The patient was provided an opportunity to ask questions and all were answered. The patient agreed with the plan and demonstrated an understanding of the instructions.   The patient was advised to call back or seek an in-person evaluation if the symptoms worsen or if the condition fails to improve as anticipated.  I provided 30 minutes of non-face-to-face time during this encounter.   Crecencio Mc, MD

## 2020-04-25 NOTE — Assessment & Plan Note (Addendum)
His pain has become severe.  Advised to start using lyrica regularly instead of prn and to gradually increase lyrica dose to 150 mg tid gradually .  I have refilled his oxycodone for use at bedtime if needed. Strongly advised to start using the ankle scooter to manage his pressure ulcer

## 2020-04-26 DIAGNOSIS — E1142 Type 2 diabetes mellitus with diabetic polyneuropathy: Secondary | ICD-10-CM

## 2020-04-28 DIAGNOSIS — E1142 Type 2 diabetes mellitus with diabetic polyneuropathy: Secondary | ICD-10-CM | POA: Insufficient documentation

## 2020-04-28 DIAGNOSIS — G608 Other hereditary and idiopathic neuropathies: Secondary | ICD-10-CM | POA: Insufficient documentation

## 2020-05-05 NOTE — Telephone Encounter (Signed)
Pt called back and stated that he did a home test and was positive for covid. Pt was advised to also get tested with a PCR test to confirm. Pt stated that he would call to schedule an appt for testing. Pt was also advised to be evaluated but no appts available in our office for today and pt can't afford urgent care. Pt stated that he is not having any SOBr, but is very stopped up, stuffy nose and when blowing his nose he stated it burns his nostril. Pt is wanting to know if he needs any medications for his symptoms.

## 2020-05-05 NOTE — Telephone Encounter (Signed)
LMTCB

## 2020-05-07 ENCOUNTER — Other Ambulatory Visit: Payer: Self-pay | Admitting: Internal Medicine

## 2020-05-08 ENCOUNTER — Telehealth: Payer: BC Managed Care – PPO | Admitting: Family

## 2020-05-08 DIAGNOSIS — B9689 Other specified bacterial agents as the cause of diseases classified elsewhere: Secondary | ICD-10-CM

## 2020-05-08 DIAGNOSIS — J019 Acute sinusitis, unspecified: Secondary | ICD-10-CM | POA: Diagnosis not present

## 2020-05-08 MED ORDER — AMOXICILLIN-POT CLAVULANATE 875-125 MG PO TABS: 1.0000 | ORAL_TABLET | Freq: Two times a day (BID) | ORAL | 0 refills | Status: DC

## 2020-05-08 NOTE — Progress Notes (Signed)

## 2020-05-22 ENCOUNTER — Telehealth: Payer: Self-pay

## 2020-05-22 MED ORDER — OXYCODONE HCL 5 MG PO TABS
5.0000 mg | ORAL_TABLET | Freq: Every evening | ORAL | 0 refills | Status: DC | PRN
Start: 2020-06-22 — End: 2020-08-22

## 2020-05-22 MED ORDER — OXYCODONE HCL 5 MG PO TABS
5.0000 mg | ORAL_TABLET | Freq: Every evening | ORAL | 0 refills | Status: DC | PRN
Start: 2020-05-23 — End: 2020-09-20

## 2020-05-22 NOTE — Telephone Encounter (Signed)
Refilled

## 2020-05-22 NOTE — Telephone Encounter (Signed)
Medication refill:Oxycodone Last seen:11-29-19 Last ordered:04-23-20

## 2020-05-25 NOTE — Telephone Encounter (Signed)
Patient called about his oxyCODONE (ROXICODONE) 5 MG immediate release tablet, his pharmacy said it needs a prior authorization.

## 2020-05-26 MED ORDER — TRAMADOL HCL 50 MG PO TABS: 100.0000 mg | ORAL_TABLET | Freq: Four times a day (QID) | ORAL | 0 refills | Status: AC | PRN

## 2020-05-26 MED ORDER — TRAMADOL HCL 50 MG PO TABS: 100.0000 mg | ORAL_TABLET | Freq: Four times a day (QID) | ORAL | 0 refills | Status: DC | PRN

## 2020-05-26 NOTE — Telephone Encounter (Signed)
Sending tramadol to use while waiting for the oxycodone PA to be completed,  He can use 2 tablets every 6 hours.  I have asked Juliann Pulse to look into the delay that occurred last week to see if there was a miscommunication or lack of communication to [place]

## 2020-05-26 NOTE — Telephone Encounter (Signed)
Spoke with pt and apologized for this PA not being done in a timely manner. I explained to him that I was out of the office last week and returned yesterday. I let him know that I saw the PA request yesterday and submitted it as an urgent request. Pt was understanding.

## 2020-05-26 NOTE — Telephone Encounter (Signed)
Juliann Pulse,  Can you look into this? Thanks.

## 2020-05-26 NOTE — Telephone Encounter (Signed)
LMTCB

## 2020-05-26 NOTE — Addendum Note (Signed)
Addended by: Crecencio Mc on: 05/26/2020 09:54 AM   Modules accepted: Orders

## 2020-05-26 NOTE — Telephone Encounter (Signed)
Pt returned your call.  

## 2020-05-27 NOTE — Telephone Encounter (Signed)
Pt returned your call-please call back

## 2020-05-27 NOTE — Telephone Encounter (Signed)
Pt called back and he was made aware that the oxycodone has been approved and he can keep it up from pharmacy now.

## 2020-05-27 NOTE — Telephone Encounter (Signed)
Tried contacting pt twice and pt was unable to hear me on the phone.

## 2020-05-28 DIAGNOSIS — B0239 Other herpes zoster eye disease: Secondary | ICD-10-CM | POA: Diagnosis not present

## 2020-05-28 DIAGNOSIS — L97512 Non-pressure chronic ulcer of other part of right foot with fat layer exposed: Secondary | ICD-10-CM | POA: Diagnosis not present

## 2020-05-28 DIAGNOSIS — E11621 Type 2 diabetes mellitus with foot ulcer: Secondary | ICD-10-CM | POA: Diagnosis not present

## 2020-05-28 DIAGNOSIS — Z89431 Acquired absence of right foot: Secondary | ICD-10-CM | POA: Diagnosis not present

## 2020-06-01 DIAGNOSIS — G609 Hereditary and idiopathic neuropathy, unspecified: Secondary | ICD-10-CM | POA: Diagnosis not present

## 2020-06-01 DIAGNOSIS — R768 Other specified abnormal immunological findings in serum: Secondary | ICD-10-CM | POA: Diagnosis not present

## 2020-06-01 DIAGNOSIS — Z89431 Acquired absence of right foot: Secondary | ICD-10-CM | POA: Diagnosis not present

## 2020-06-29 DIAGNOSIS — E11621 Type 2 diabetes mellitus with foot ulcer: Secondary | ICD-10-CM | POA: Diagnosis not present

## 2020-06-29 DIAGNOSIS — L97512 Non-pressure chronic ulcer of other part of right foot with fat layer exposed: Secondary | ICD-10-CM | POA: Diagnosis not present

## 2020-07-08 DIAGNOSIS — L821 Other seborrheic keratosis: Secondary | ICD-10-CM | POA: Diagnosis not present

## 2020-07-08 DIAGNOSIS — L918 Other hypertrophic disorders of the skin: Secondary | ICD-10-CM | POA: Diagnosis not present

## 2020-07-08 DIAGNOSIS — L570 Actinic keratosis: Secondary | ICD-10-CM | POA: Diagnosis not present

## 2020-08-10 DIAGNOSIS — L97512 Non-pressure chronic ulcer of other part of right foot with fat layer exposed: Secondary | ICD-10-CM | POA: Diagnosis not present

## 2020-08-10 DIAGNOSIS — E11621 Type 2 diabetes mellitus with foot ulcer: Secondary | ICD-10-CM | POA: Diagnosis not present

## 2020-08-10 DIAGNOSIS — L97912 Non-pressure chronic ulcer of unspecified part of right lower leg with fat layer exposed: Secondary | ICD-10-CM | POA: Diagnosis not present

## 2020-08-19 ENCOUNTER — Telehealth: Payer: Self-pay | Admitting: Internal Medicine

## 2020-08-19 NOTE — Telephone Encounter (Signed)
Refill denied.  He was reminded last month of the 3 month policy and I see no appt scheduled. Last months' refill was a Manufacturing engineer. I will not refill until he has scheduled an appt

## 2020-08-19 NOTE — Telephone Encounter (Signed)
RX Refill:oxycodone Last Seen:04-23-20 Last ordered:06-22-20

## 2020-08-19 NOTE — Telephone Encounter (Signed)
Patient is requesting a refill on his oxyCODONE (ROXICODONE) 5 MG immediate release tablet.

## 2020-08-22 ENCOUNTER — Other Ambulatory Visit: Payer: Self-pay | Admitting: Internal Medicine

## 2020-08-22 MED ORDER — OXYCODONE HCL 5 MG PO TABS
5.0000 mg | ORAL_TABLET | Freq: Every evening | ORAL | 0 refills | Status: DC | PRN
Start: 2020-08-22 — End: 2020-09-20

## 2020-09-04 ENCOUNTER — Telehealth: Payer: BC Managed Care – PPO | Admitting: Physician Assistant

## 2020-09-04 DIAGNOSIS — J40 Bronchitis, not specified as acute or chronic: Secondary | ICD-10-CM

## 2020-09-04 MED ORDER — BENZONATATE 100 MG PO CAPS: 100.0000 mg | ORAL_CAPSULE | Freq: Three times a day (TID) | ORAL | 0 refills | Status: DC | PRN

## 2020-09-04 MED ORDER — PREDNISONE 10 MG PO TABS: ORAL_TABLET | ORAL | 0 refills | Status: DC

## 2020-09-04 NOTE — Progress Notes (Signed)
We are sorry that you are not feeling well.  Here is how we plan to help!  Based on your presentation I believe you most likely have A cough due to a virus.  This is called viral bronchitis and is best treated by rest, plenty of fluids and control of the cough.  You may use Ibuprofen or Tylenol as directed to help your symptoms.     In addition you may use A non-prescription cough medication called Robitussin DAC. Take 2 teaspoons every 8 hours or Delsym: take 2 teaspoons every 12 hours. and A prescription cough medication called Tessalon Perles 100mg. You may take 1-2 capsules every 8 hours as needed for your cough.  Prednisone 10 mg daily for 6 days (see taper instructions below)  Directions for 6 day taper: Day 1: 2 tablets before breakfast, 1 after both lunch & dinner and 2 at bedtime Day 2: 1 tab before breakfast, 1 after both lunch & dinner and 2 at bedtime Day 3: 1 tab at each meal & 1 at bedtime Day 4: 1 tab at breakfast, 1 at lunch, 1 at bedtime Day 5: 1 tab at breakfast & 1 tab at bedtime Day 6: 1 tab at breakfast   From your responses in the eVisit questionnaire you describe inflammation in the upper respiratory tract which is causing a significant cough.  This is commonly called Bronchitis and has four common causes:    Allergies  Viral Infections  Acid Reflux  Bacterial Infection Allergies, viruses and acid reflux are treated by controlling symptoms or eliminating the cause. An example might be a cough caused by taking certain blood pressure medications. You stop the cough by changing the medication. Another example might be a cough caused by acid reflux. Controlling the reflux helps control the cough.  USE OF BRONCHODILATOR ("RESCUE") INHALERS: There is a risk from using your bronchodilator too frequently.  The risk is that over-reliance on a medication which only relaxes the muscles surrounding the breathing tubes can reduce the effectiveness of medications prescribed to  reduce swelling and congestion of the tubes themselves.  Although you feel brief relief from the bronchodilator inhaler, your asthma may actually be worsening with the tubes becoming more swollen and filled with mucus.  This can delay other crucial treatments, such as oral steroid medications. If you need to use a bronchodilator inhaler daily, several times per day, you should discuss this with your provider.  There are probably better treatments that could be used to keep your asthma under control.     HOME CARE . Only take medications as instructed by your medical team. . Complete the entire course of an antibiotic. . Drink plenty of fluids and get plenty of rest. . Avoid close contacts especially the very young and the elderly . Cover your mouth if you cough or cough into your sleeve. . Always remember to wash your hands . A steam or ultrasonic humidifier can help congestion.   GET HELP RIGHT AWAY IF: . You develop worsening fever. . You become short of breath . You cough up blood. . Your symptoms persist after you have completed your treatment plan MAKE SURE YOU   Understand these instructions.  Will watch your condition.  Will get help right away if you are not doing well or get worse.  Your e-visit answers were reviewed by a board certified advanced clinical practitioner to complete your personal care plan.  Depending on the condition, your plan could have included both over the   counter or prescription medications. If there is a problem please reply  once you have received a response from your provider. Your safety is important to us.  If you have drug allergies check your prescription carefully.    You can use MyChart to ask questions about today's visit, request a non-urgent call back, or ask for a work or school excuse for 24 hours related to this e-Visit. If it has been greater than 24 hours you will need to follow up with your provider, or enter a new e-Visit to address those  concerns. You will get an e-mail in the next two days asking about your experience.  I hope that your e-visit has been valuable and will speed your recovery. Thank you for using e-visits.  Greater than 5 minutes, yet less than 10 minutes of time have been spent researching, coordinating and implementing care for this patient today.   

## 2020-09-05 ENCOUNTER — Other Ambulatory Visit: Payer: Self-pay | Admitting: Internal Medicine

## 2020-09-07 MED ORDER — DOXYCYCLINE HYCLATE 100 MG PO TABS: 100.0000 mg | ORAL_TABLET | Freq: Two times a day (BID) | ORAL | 0 refills | Status: DC

## 2020-09-07 NOTE — Addendum Note (Signed)
Addended by: Evelina Dun A on: 09/07/2020 09:11 AM   Modules accepted: Orders

## 2020-09-17 DIAGNOSIS — E119 Type 2 diabetes mellitus without complications: Secondary | ICD-10-CM | POA: Diagnosis not present

## 2020-09-17 LAB — HM DIABETES EYE EXAM

## 2020-09-18 ENCOUNTER — Encounter: Payer: Self-pay | Admitting: Internal Medicine

## 2020-09-18 ENCOUNTER — Other Ambulatory Visit: Payer: Self-pay

## 2020-09-18 ENCOUNTER — Telehealth (INDEPENDENT_AMBULATORY_CARE_PROVIDER_SITE_OTHER): Payer: BC Managed Care – PPO | Admitting: Internal Medicine

## 2020-09-18 ENCOUNTER — Telehealth: Payer: Self-pay | Admitting: Internal Medicine

## 2020-09-18 VITALS — Ht 70.0 in | Wt 238.0 lb

## 2020-09-18 DIAGNOSIS — J0110 Acute frontal sinusitis, unspecified: Secondary | ICD-10-CM

## 2020-09-18 DIAGNOSIS — Z23 Encounter for immunization: Secondary | ICD-10-CM

## 2020-09-18 DIAGNOSIS — E1142 Type 2 diabetes mellitus with diabetic polyneuropathy: Secondary | ICD-10-CM

## 2020-09-18 MED ORDER — LEVOFLOXACIN 500 MG PO TABS: 500.0000 mg | ORAL_TABLET | Freq: Every day | ORAL | 0 refills | Status: DC

## 2020-09-18 MED ORDER — PREDNISONE 10 MG PO TABS
ORAL_TABLET | ORAL | 0 refills | Status: DC
Start: 2020-09-18 — End: 2020-10-19

## 2020-09-18 NOTE — Telephone Encounter (Signed)
Pt called back and tested negative for Covid using an at home test on Wednesday  Pt has been scheduled for a Flu shot and labs on 10/01/20

## 2020-09-18 NOTE — Telephone Encounter (Signed)
Called patient to schedule labs/flu shot next week, per Dr. Derrel Nip. Patient has negative home covid test, sinusitis

## 2020-09-18 NOTE — Progress Notes (Signed)
Diabetic Eye Exam was done yesterday at [facility]. No Diabetic Retinopathy.

## 2020-09-18 NOTE — Progress Notes (Signed)
Virtual Visit via Caregilty  This visit type was conducted due to national recommendations for restrictions regarding the COVID-19 pandemic (e.g. social distancing).  This format is felt to be most appropriate for this patient at this time.  All issues noted in this document were discussed and addressed.  No physical exam was performed (except for noted visual exam findings with Video Visits).   I connected with@ on 09/18/20 at  2:00 PM EST by a video enabled telemedicine application and verified that I am speaking with the correct person using two identifiers. Location patient: home Location provider: work or home office Persons participating in the virtual visit: patient, provider  I discussed the limitations, risks, security and privacy concerns of performing an evaluation and management service by telephone and the availability of in person appointments. I also discussed with the patient that there may be a patient responsible charge related to this service. The patient expressed understanding and agreed to proceed.  Reason for visit: persistent sinus congestion,  Stuffy head/eye, and intermittent nausea   HPI:  55 yr old male  with type 2 DM with neuropathy, presents for follow up on prolonged symptoms of bronchitis and sinusitis . Patient was treated for bronchitis  on jan 7 via an E visit with doxycycline and tessalon  And prednisone.  His home COVID test was negative on jan 14.  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  He is vaccinated.  He continues to have a headache that feels like pressure,  Frontal sinus pain.  Mild nausea,  Clear rhinorrhea most of the day but purulent in the mornings     ROS: See pertinent positives and negatives per HPI.  Past Medical History:  Diagnosis Date  . Allergy   . Depression   . Sleep apnea     Past Surgical History:  Procedure Laterality Date  . FOOT SURGERY Right 02/14    cyst removed  . 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 TOE AMPUTATION Right 06/30/2016   5 toes    Family History  Problem Relation Age of Onset  . Heart disease Father   . Stroke Father   . Heart attack Father 2  . Heart disease Maternal Grandmother   . Stroke Maternal Grandmother   . Heart disease Paternal Grandfather   . Stroke Paternal Grandfather     SOCIAL HX:  reports that he has never smoked. He has never used smokeless tobacco. He reports that he does not drink alcohol and does not use drugs.   Current Outpatient Medications:  .  acyclovir (ZOVIRAX) 400 MG tablet, Take 1 tablet by mouth as needed., Disp: , Rfl: 0 .  albuterol (VENTOLIN HFA) 108 (90 Base) MCG/ACT inhaler, TAKE 2 PUFFS BY MOUTH EVERY 6 HOURS AS NEEDED FOR WHEEZE OR SHORTNESS OF BREATH, Disp: 8 g, Rfl: 0 .  levofloxacin (LEVAQUIN) 500 MG tablet, Take 1 tablet (500 mg total) by mouth daily., Disp: 7 tablet, Rfl: 0 .  Multiple Vitamins-Minerals (MULTIVITAMIN WITH MINERALS) tablet, Take 1 tablet by mouth daily., Disp: , Rfl:  .  pravastatin (PRAVACHOL) 40 MG tablet, Take 1 tablet (40 mg total) by mouth daily., Disp: 90 tablet, Rfl: 1 .  predniSONE (DELTASONE) 10 MG tablet, 6 tablets on Day 1 , then reduce by 1 tablet daily until gone, Disp: 21 tablet, Rfl: 0 .  pregabalin (LYRICA) 75 MG capsule, Take 2 capsules (150  mg total) by mouth 3 (three) times daily., Disp: 180 capsule, Rfl: 5 .  venlafaxine (EFFEXOR) 37.5 MG tablet, Take 37.5 mg by mouth 2 (two) times daily., Disp: , Rfl:  .  [START ON 10/21/2020] oxyCODONE (ROXICODONE) 5 MG immediate release tablet, Take 1 tablet (5 mg total) by mouth at bedtime as needed for severe pain., Disp: 30 tablet, Rfl: 0 .  [START ON 09/21/2020] oxyCODONE (ROXICODONE) 5 MG immediate release tablet, Take 1 tablet (5 mg total) by mouth at bedtime as needed for severe pain., Disp: 30 tablet, Rfl: 0 .  [START ON 11/20/2020] oxyCODONE (ROXICODONE) 5 MG immediate release tablet,  Take 1 tablet (5 mg total) by mouth at bedtime as needed for severe pain., Disp: 30 tablet, Rfl: 0  EXAM:  VITALS per patient if applicable:  GENERAL: alert, oriented, appears well and in no acute distress  HEENT: atraumatic, conjunttiva clear, no obvious abnormalities on inspection of external nose and ears  NECK: normal movements of the head and neck  LUNGS: on inspection no signs of respiratory distress, breathing rate appears normal, no obvious gross SOB, gasping or wheezing  CV: no obvious cyanosis  MS: moves all visible extremities without noticeable abnormality  PSYCH/NEURO: pleasant and cooperative, no obvious depression or anxiety, speech and thought processing grossly intact  ASSESSMENT AND PLAN:  Discussed the following assessment and plan:  COVID-19 vaccine administered - Plan: SARS-CoV-2 Semi-Quantitative Total Antibody, Spike  Acute non-recurrent frontal sinusitis  Diabetic polyneuropathy associated with type 2 diabetes mellitus (HCC)  Sinusitis, acute frontal Treating with levaquin and prednisone for s/s consistent with post viral acute sinusitis/  Diabetic neuropathy associated with type 2 diabetes mellitus ([place]) His pain has become severe despite use of lyrica which is now doses to 150 mg tid gradually .  I have refilled his oxycodone for use at bedtime if needed.  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 Due to the nature of his job patient is on his feet at least 8 hours a day between standing, driving and ambulating      I discussed the assessment and treatment plan with the patient. The patient was provided an opportunity to ask questions and all were answered. The patient agreed with the plan and demonstrated an understanding of the instructions.   The patient was advised to call back or seek an in-person evaluation if the symptoms worsen or if the condition fails to improve  as anticipated.  I spent 30 minutes dedicated to the care of this patient on the date of this encounter to include pre-visit review of his medical history,  Records from [facility] via Epic portal, Face-to-face time with the patient , and post visit ordering of testing and therapeutics.   Crecencio Mc, MD

## 2020-09-20 DIAGNOSIS — J0110 Acute frontal sinusitis, unspecified: Secondary | ICD-10-CM | POA: Insufficient documentation

## 2020-09-20 MED ORDER — OXYCODONE HCL 5 MG PO TABS: 5.0000 mg | ORAL_TABLET | Freq: Every evening | ORAL | 0 refills | Status: DC | PRN

## 2020-09-20 NOTE — Assessment & Plan Note (Addendum)
His pain has become severe despite use of lyrica which is now doses to 150 mg tid gradually .  I have refilled his oxycodone for use at bedtime if needed.  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 Due to the nature of his job patient is on his feet at least 8 hours a day between standing, driving and ambulating

## 2020-09-20 NOTE — Assessment & Plan Note (Signed)
Treating with levaquin and prednisone for s/s consistent with post viral acute sinusitis/

## 2020-09-21 DIAGNOSIS — E11621 Type 2 diabetes mellitus with foot ulcer: Secondary | ICD-10-CM | POA: Diagnosis not present

## 2020-09-21 DIAGNOSIS — L97512 Non-pressure chronic ulcer of other part of right foot with fat layer exposed: Secondary | ICD-10-CM | POA: Diagnosis not present

## 2020-09-21 DIAGNOSIS — Z89431 Acquired absence of right foot: Secondary | ICD-10-CM | POA: Diagnosis not present

## 2020-09-29 DIAGNOSIS — Z8614 Personal history of Methicillin resistant Staphylococcus aureus infection: Secondary | ICD-10-CM

## 2020-09-29 HISTORY — DX: Personal history of Methicillin resistant Staphylococcus aureus infection: Z86.14

## 2020-09-30 ENCOUNTER — Encounter: Payer: Self-pay | Admitting: Internal Medicine

## 2020-10-01 ENCOUNTER — Ambulatory Visit (INDEPENDENT_AMBULATORY_CARE_PROVIDER_SITE_OTHER): Payer: BC Managed Care – PPO

## 2020-10-01 ENCOUNTER — Other Ambulatory Visit: Payer: Self-pay

## 2020-10-01 DIAGNOSIS — R5383 Other fatigue: Secondary | ICD-10-CM | POA: Diagnosis not present

## 2020-10-01 DIAGNOSIS — E669 Obesity, unspecified: Secondary | ICD-10-CM

## 2020-10-01 DIAGNOSIS — E1169 Type 2 diabetes mellitus with other specified complication: Secondary | ICD-10-CM | POA: Diagnosis not present

## 2020-10-01 DIAGNOSIS — E785 Hyperlipidemia, unspecified: Secondary | ICD-10-CM

## 2020-10-01 DIAGNOSIS — D45 Polycythemia vera: Secondary | ICD-10-CM | POA: Diagnosis not present

## 2020-10-01 DIAGNOSIS — Z23 Encounter for immunization: Secondary | ICD-10-CM | POA: Diagnosis not present

## 2020-10-01 LAB — CBC WITH DIFFERENTIAL/PLATELET
Basophils Absolute: 0.2 10*3/uL — ABNORMAL HIGH (ref 0.0–0.1)
Basophils Relative: 2.7 % (ref 0.0–3.0)
Eosinophils Absolute: 0.3 10*3/uL (ref 0.0–0.7)
Eosinophils Relative: 4.2 % (ref 0.0–5.0)
HCT: 46.7 % (ref 39.0–52.0)
Hemoglobin: 15.7 g/dL (ref 13.0–17.0)
Lymphocytes Relative: 21.6 % (ref 12.0–46.0)
Lymphs Abs: 1.7 10*3/uL (ref 0.7–4.0)
MCHC: 33.5 g/dL (ref 30.0–36.0)
MCV: 89.9 fl (ref 78.0–100.0)
Monocytes Absolute: 1 10*3/uL (ref 0.1–1.0)
Monocytes Relative: 12.6 % — ABNORMAL HIGH (ref 3.0–12.0)
Neutro Abs: 4.6 10*3/uL (ref 1.4–7.7)
Neutrophils Relative %: 58.9 % (ref 43.0–77.0)
Platelets: 176 10*3/uL (ref 150.0–400.0)
RBC: 5.2 Mil/uL (ref 4.22–5.81)
RDW: 15.1 % (ref 11.5–15.5)
WBC: 7.9 10*3/uL (ref 4.0–10.5)

## 2020-10-01 LAB — COMPREHENSIVE METABOLIC PANEL
ALT: 52 U/L (ref 0–53)
AST: 34 U/L (ref 0–37)
Albumin: 3.7 g/dL (ref 3.5–5.2)
Alkaline Phosphatase: 49 U/L (ref 39–117)
BUN: 11 mg/dL (ref 6–23)
CO2: 26 mEq/L (ref 19–32)
Calcium: 8.4 mg/dL (ref 8.4–10.5)
Chloride: 101 mEq/L (ref 96–112)
Creatinine, Ser: 0.8 mg/dL (ref 0.40–1.50)
GFR: 99.18 mL/min (ref >60.00)
Glucose, Bld: 217 mg/dL — ABNORMAL HIGH (ref 70–99)
Potassium: 4.1 mEq/L (ref 3.5–5.1)
Sodium: 136 mEq/L (ref 135–145)
Total Bilirubin: 0.8 mg/dL (ref 0.2–1.2)
Total Protein: 7 g/dL (ref 6.0–8.3)

## 2020-10-01 LAB — LIPID PANEL
Cholesterol: 175 mg/dL (ref 0–200)
HDL: 37.3 mg/dL — ABNORMAL LOW (ref >39.00)
LDL Cholesterol: 102 mg/dL — ABNORMAL HIGH (ref 0–99)
NonHDL: 137.83
Total CHOL/HDL Ratio: 5
Triglycerides: 181 mg/dL — ABNORMAL HIGH (ref 0.0–149.0)
VLDL: 36.2 mg/dL (ref 0.0–40.0)

## 2020-10-01 LAB — HEMOGLOBIN A1C: Hgb A1c MFr Bld: 9.3 % — ABNORMAL HIGH (ref 4.6–6.5)

## 2020-10-01 LAB — MICROALBUMIN / CREATININE URINE RATIO
Creatinine,U: 180.8 mg/dL
Microalb Creat Ratio: 3.8 mg/g (ref 0.0–30.0)
Microalb, Ur: 6.9 mg/dL — ABNORMAL HIGH (ref 0.0–1.9)

## 2020-10-01 LAB — B12 AND FOLATE PANEL
Folate: 21.8 ng/mL (ref >5.9)
Vitamin B-12: 489 pg/mL (ref 211–911)

## 2020-10-01 NOTE — Progress Notes (Signed)
Your a1c was surprisingly high at 9.3  , which suggests that your sugars are > 200.  please schedule a follow up visit  ASAP to review your blood sugars.   I am interested in fasting  and post prandials.    Regards,   Deborra Medina, MD

## 2020-10-02 ENCOUNTER — Telehealth: Payer: Self-pay

## 2020-10-02 NOTE — Telephone Encounter (Signed)
Left message to call back and schedule an appointment with Dr. Derrel Nip to discuss Lab Results.

## 2020-10-04 LAB — SARS-COV-2 SEMI-QUANTITATIVE TOTAL ANTIBODY, SPIKE: SARS COV2 AB, Total Spike Semi QN: >2500 U/mL — ABNORMAL HIGH

## 2020-10-05 LAB — ERYTHROPOIETIN: Erythropoietin: 31 m[IU]/mL — ABNORMAL HIGH (ref 2.6–18.5)

## 2020-10-06 ENCOUNTER — Encounter: Payer: Self-pay | Admitting: Internal Medicine

## 2020-10-06 ENCOUNTER — Telehealth: Payer: Self-pay

## 2020-10-06 NOTE — Telephone Encounter (Signed)
Left message to call back for lab results.

## 2020-10-09 ENCOUNTER — Other Ambulatory Visit: Payer: Self-pay | Admitting: Internal Medicine

## 2020-10-09 DIAGNOSIS — D751 Secondary polycythemia: Secondary | ICD-10-CM

## 2020-10-19 ENCOUNTER — Inpatient Hospital Stay: Payer: BC Managed Care – PPO

## 2020-10-19 ENCOUNTER — Inpatient Hospital Stay: Payer: BC Managed Care – PPO | Attending: Oncology | Admitting: Oncology

## 2020-10-19 ENCOUNTER — Encounter: Payer: Self-pay | Admitting: Oncology

## 2020-10-19 VITALS — BP 136/86 | HR 90 | Temp 98.1°F | Resp 16 | Ht 70.0 in | Wt 234.9 lb

## 2020-10-19 DIAGNOSIS — D751 Secondary polycythemia: Secondary | ICD-10-CM | POA: Diagnosis not present

## 2020-10-19 DIAGNOSIS — Z79899 Other long term (current) drug therapy: Secondary | ICD-10-CM | POA: Insufficient documentation

## 2020-10-19 DIAGNOSIS — Z6830 Body mass index (BMI) 30.0-30.9, adult: Secondary | ICD-10-CM | POA: Insufficient documentation

## 2020-10-19 DIAGNOSIS — E669 Obesity, unspecified: Secondary | ICD-10-CM | POA: Insufficient documentation

## 2020-10-19 DIAGNOSIS — F32A Depression, unspecified: Secondary | ICD-10-CM | POA: Diagnosis not present

## 2020-10-19 DIAGNOSIS — E559 Vitamin D deficiency, unspecified: Secondary | ICD-10-CM | POA: Diagnosis not present

## 2020-10-19 DIAGNOSIS — E1142 Type 2 diabetes mellitus with diabetic polyneuropathy: Secondary | ICD-10-CM | POA: Diagnosis not present

## 2020-10-19 DIAGNOSIS — E785 Hyperlipidemia, unspecified: Secondary | ICD-10-CM | POA: Diagnosis not present

## 2020-10-19 DIAGNOSIS — G4733 Obstructive sleep apnea (adult) (pediatric): Secondary | ICD-10-CM | POA: Insufficient documentation

## 2020-10-19 LAB — CBC WITH DIFFERENTIAL/PLATELET
Abs Immature Granulocytes: 0.11 10*3/uL — ABNORMAL HIGH (ref 0.00–0.07)
Basophils Absolute: 0.2 10*3/uL — ABNORMAL HIGH (ref 0.0–0.1)
Basophils Relative: 2 %
Eosinophils Absolute: 0.4 10*3/uL (ref 0.0–0.5)
Eosinophils Relative: 4 %
HCT: 45.2 % (ref 39.0–52.0)
Hemoglobin: 15 g/dL (ref 13.0–17.0)
Immature Granulocytes: 1 %
Lymphocytes Relative: 22 %
Lymphs Abs: 2.2 10*3/uL (ref 0.7–4.0)
MCH: 30 pg (ref 26.0–34.0)
MCHC: 33.2 g/dL (ref 30.0–36.0)
MCV: 90.4 fL (ref 80.0–100.0)
Monocytes Absolute: 1.5 10*3/uL — ABNORMAL HIGH (ref 0.1–1.0)
Monocytes Relative: 15 %
Neutro Abs: 5.7 10*3/uL (ref 1.7–7.7)
Neutrophils Relative %: 56 %
Platelets: 253 10*3/uL (ref 150–400)
RBC: 5 MIL/uL (ref 4.22–5.81)
RDW: 14.9 % (ref 11.5–15.5)
WBC: 10.1 10*3/uL (ref 4.0–10.5)
nRBC: 0 % (ref 0.0–0.2)

## 2020-10-19 LAB — URINALYSIS, COMPLETE (UACMP) WITH MICROSCOPIC
Bacteria, UA: NONE SEEN
Bilirubin Urine: NEGATIVE
Glucose, UA: NEGATIVE mg/dL
Hgb urine dipstick: NEGATIVE
Ketones, ur: NEGATIVE mg/dL
Leukocytes,Ua: NEGATIVE
Nitrite: NEGATIVE
Protein, ur: NEGATIVE mg/dL
Specific Gravity, Urine: 1.017 (ref 1.005–1.030)
pH: 5 (ref 5.0–8.0)

## 2020-10-19 NOTE — Progress Notes (Signed)
Hematology/Oncology Consult note [facility] Telephone:([phone] [phone]  Patient Care Team: Crecencio Mc, MD as PCP - General (Internal Medicine) Minna Merritts, MD as Consulting Physician (Cardiology)   Name of the patient: [name]  [id number]  24-Jul-1966    Reason for referral-polycythemia   Referring physician-Dr. Derrel Nip  Date of visit: 10/19/20   History of presenting illness- Patient is a 55 year old male with a past medical history significant for obesity, obstructive sleep apnea, hyperlipidemia who has been referred for polycythemia.  Most recent CBC from 10/01/2020 showed white count of 7.9, H&H of 15.7/26.7 with a platelet count of 176.  But prior to that his hemoglobin was 17.5 and months ago.  CMP showed elevated blood sugar of 217.  B12 and folate were normal.  EPO levels were mildly elevated at 31.  Patient has been dealing with peripheral neuropathy affecting his bilateral toes and ulcerations in his foot.  His hemoglobin A1c was high at 9.3.  He was diagnosed with obstructive sleep apnea following a sleep study but patient does not use any CPAP.  ECOG PS- 1  Pain scale- 3   Review of systems- Review of Systems  Constitutional: Positive for malaise/fatigue. Negative for chills, fever and weight loss.  HENT: Negative for congestion, ear discharge and nosebleeds.   Eyes: Negative for blurred vision.  Respiratory: Negative for cough, hemoptysis, sputum production, shortness of breath and wheezing.   Cardiovascular: Negative for chest pain, palpitations, orthopnea and claudication.  Gastrointestinal: Negative for abdominal pain, blood in stool, constipation, diarrhea, heartburn, melena, nausea and vomiting.  Genitourinary: Negative for dysuria, flank pain, frequency, hematuria and urgency.  Musculoskeletal: Positive for joint pain (Bilateral foot pain). Negative for back pain and myalgias.  Skin: Negative for rash.   Neurological: Negative for dizziness, tingling, focal weakness, seizures, weakness and headaches.  Endo/Heme/Allergies: Does not bruise/bleed easily.  Psychiatric/Behavioral: Negative for depression and suicidal ideas. The patient does not have insomnia.     Allergies  Allergen Reactions  . Povidone-Iodine Other (See Comments)    Patient states that "it opened his wound up"  . Vancomycin     Other reaction(s): Other (See Comments) Red mans    Patient Active Problem List   Diagnosis Date Noted  . Sinusitis, acute frontal 09/20/2020  . Diabetic sensorimotor polyneuropathy ([place]) 04/28/2020  . 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 Hyperlipidemia associated with type 2 diabetes mellitus ([place]) 09/22/2018  . Diabetes mellitus type 2 in obese ([place]) 09/09/2016  . Vitamin D deficiency 11/08/2013  . Routine general medical examination at a health care facility 05/09/2013  . Chronic refractory osteomyelitis of right foot ([place]) 01/24/2013  . Diabetic neuropathy associated with type 2 diabetes mellitus ([place]) 01/24/2013  . OSA (obstructive sleep apnea) 01/24/2013  . Obesity (BMI 30-39.9) 01/24/2013  . Elevated hemoglobin ([place]) 01/24/2013     Past Medical History:  Diagnosis Date  . Allergy   . Depression   . Sleep apnea      Past Surgical History:  Procedure Laterality Date  . FOOT SURGERY Right 02/14   cyst removed  . 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 TOE AMPUTATION Right 06/30/2016   5 toes    Social History   Socioeconomic History  . Marital status: Married    Spouse name: Not on file  . Number of children: Not on file  . Years of education: Not on file  .  Highest education level: Not on file  Occupational History  . Not on file  Tobacco Use  . Smoking status: Never Smoker  . Smokeless tobacco: Never Used  Substance and Sexual Activity  . Alcohol use: No  . Drug use: No  . Sexual activity: Yes  Other Topics Concern   . Not on file  Social History Narrative  . Not on file   Social Determinants of Health   Financial Resource Strain: Not on file  Food Insecurity: Not on file  Transportation Needs: Not on file  Physical Activity: Not on file  Stress: Not on file  Social Connections: Not on file  Intimate Partner Violence: Not on file     Family History  Problem Relation Age of Onset  . Heart disease Father   . Stroke Father   . Heart attack Father 62  . Heart disease Maternal Grandmother   . Stroke Maternal Grandmother   . Heart disease Paternal Grandfather   . Stroke Paternal Grandfather      Current Outpatient Medications:  .  acyclovir (ZOVIRAX) 400 MG tablet, Take 1 tablet by mouth as needed., Disp: , Rfl: 0 .  albuterol (VENTOLIN HFA) 108 (90 Base) MCG/ACT inhaler, TAKE 2 PUFFS BY MOUTH EVERY 6 HOURS AS NEEDED FOR WHEEZE OR SHORTNESS OF BREATH, Disp: 8 g, Rfl: 0 .  levofloxacin (LEVAQUIN) 500 MG tablet, Take 1 tablet (500 mg total) by mouth daily., Disp: 7 tablet, Rfl: 0 .  Multiple Vitamins-Minerals (MULTIVITAMIN WITH MINERALS) tablet, Take 1 tablet by mouth daily., Disp: , Rfl:  .  [START ON 10/21/2020] oxyCODONE (ROXICODONE) 5 MG immediate release tablet, Take 1 tablet (5 mg total) by mouth at bedtime as needed for severe pain., Disp: 30 tablet, Rfl: 0 .  oxyCODONE (ROXICODONE) 5 MG immediate release tablet, Take 1 tablet (5 mg total) by mouth at bedtime as needed for severe pain., Disp: 30 tablet, Rfl: 0 .  [START ON 11/20/2020] oxyCODONE (ROXICODONE) 5 MG immediate release tablet, Take 1 tablet (5 mg total) by mouth at bedtime as needed for severe pain., Disp: 30 tablet, Rfl: 0 .  pravastatin (PRAVACHOL) 40 MG tablet, Take 1 tablet (40 mg total) by mouth daily., Disp: 90 tablet, Rfl: 1 .  predniSONE (DELTASONE) 10 MG tablet, 6 tablets on Day 1 , then reduce by 1 tablet daily until gone, Disp: 21 tablet, Rfl: 0 .  pregabalin (LYRICA) 75 MG capsule, Take 2 capsules (150 mg total) by mouth  3 (three) times daily., Disp: 180 capsule, Rfl: 5 .  venlafaxine (EFFEXOR) 37.5 MG tablet, Take 37.5 mg by mouth 2 (two) times daily., Disp: , Rfl:    Physical exam:  Vitals:   10/19/20 1047 10/19/20 1059  BP: 136/86   Pulse: 90   Resp: 16   Temp: 98.1 F (36.7 C)   SpO2:  98%  Weight: 234 lb 14.4 oz (106.5 kg)   Height: 5\' 10"  (1.778 m)    Physical Exam Constitutional:      General: He is not in acute distress. Eyes:     Extraocular Movements: EOM normal.  Cardiovascular:     Rate and Rhythm: Normal rate and regular rhythm.     Heart sounds: Normal heart sounds.  Pulmonary:     Effort: Pulmonary effort is normal.     Breath sounds: Normal breath sounds.  Abdominal:     General: Bowel sounds are normal.     Palpations: Abdomen is soft.  Musculoskeletal:     Comments:  I have not examined his bilateral feet which are chronic issues of wound healing and ulcerations due to diabetes  Skin:    General: Skin is warm and dry.  Neurological:     Mental Status: He is alert and oriented to person, place, and time.        CMP Latest Ref Rng & Units 10/01/2020  Glucose 70 - 99 mg/dL 217(H)  BUN 6 - 23 mg/dL 11  Creatinine 0.40 - 1.50 mg/dL 0.80  Sodium 135 - 145 mEq/L 136  Potassium 3.5 - 5.1 mEq/L 4.1  Chloride 96 - 112 mEq/L 101  CO2 19 - 32 mEq/L 26  Calcium 8.4 - 10.5 mg/dL 8.4  Total Protein 6.0 - 8.3 g/dL 7.0  Total Bilirubin 0.2 - 1.2 mg/dL 0.8  Alkaline Phos 39 - 117 U/L 49  AST 0 - 37 U/L 34  ALT 0 - 53 U/L 52   CBC Latest Ref Rng & Units 10/01/2020  WBC 4.0 - 10.5 K/uL 7.9  Hemoglobin 13.0 - 17.0 g/dL 15.7  Hematocrit 39.0 - 52.0 % 46.7  Platelets 150.0 - 400.0 K/uL 176.0    No images are attached to the encounter.  No results found.  Assessment and plan- Patient is a 56 y.o. male referred for polycythemia likely secondary  Patient had mild polycythemia with a hemoglobin that was 17 about 10 months ago but on repeat check on 10/01/2020 which has normalized  to 15.7.  I will repeat his CBC with differential today.  Suspect that his polycythemia if hemoglobin remains greater than 16.5 is secondary to obstructive sleep apnea which can also cause mildly elevated EPO levels  We will also check JAK2 mutation today.  Elevated EPO levels can also be seen in malignancy such as hepatocellular carcinoma or renal cell carcinoma.  I will check a urinalysis today but will hold off on getting a CT scan at this time.  I will see him back in 3 weeks time for an in person or video visit  Thank you for this kind referral and the opportunity to participate in the care of this  Patient   Visit Diagnosis 1. Polycythemia     Dr. Randa Evens, MD, MPH [facility] at [facility] [phone] 10/19/2020  1:21 PM

## 2020-10-19 NOTE — Progress Notes (Signed)
Pt here for polycythemia. He was told by a PCP to [facility]. Has not done that in a while. He had surgery on his back and has neuropathy and right foot all toes removed. Has ulcer on right foot that needs another surgery- pt can't feel to know that he has sores.

## 2020-10-19 NOTE — Telephone Encounter (Signed)
Called patient to to discuss his appointment per my chart message. Patient was more upset due to he thought he should have sooner appointment due to PCP had advised he needed to come in to discuss labs, checked schedule was able to schedule patient for 10/23/20. Patient was pleased with this appointment.

## 2020-10-21 DIAGNOSIS — L918 Other hypertrophic disorders of the skin: Secondary | ICD-10-CM | POA: Diagnosis not present

## 2020-10-23 ENCOUNTER — Other Ambulatory Visit: Payer: Self-pay

## 2020-10-23 ENCOUNTER — Encounter: Payer: Self-pay | Admitting: Internal Medicine

## 2020-10-23 ENCOUNTER — Ambulatory Visit: Payer: BC Managed Care – PPO | Admitting: Internal Medicine

## 2020-10-23 VITALS — BP 122/70 | HR 102 | Temp 97.8°F | Ht 70.0 in | Wt 236.8 lb

## 2020-10-23 DIAGNOSIS — E1142 Type 2 diabetes mellitus with diabetic polyneuropathy: Secondary | ICD-10-CM

## 2020-10-23 DIAGNOSIS — E08621 Diabetes mellitus due to underlying condition with foot ulcer: Secondary | ICD-10-CM

## 2020-10-23 DIAGNOSIS — Z23 Encounter for immunization: Secondary | ICD-10-CM

## 2020-10-23 DIAGNOSIS — L97412 Non-pressure chronic ulcer of right heel and midfoot with fat layer exposed: Secondary | ICD-10-CM

## 2020-10-23 DIAGNOSIS — G4733 Obstructive sleep apnea (adult) (pediatric): Secondary | ICD-10-CM | POA: Diagnosis not present

## 2020-10-23 DIAGNOSIS — E1169 Type 2 diabetes mellitus with other specified complication: Secondary | ICD-10-CM | POA: Diagnosis not present

## 2020-10-23 DIAGNOSIS — Z89431 Acquired absence of right foot: Secondary | ICD-10-CM

## 2020-10-23 DIAGNOSIS — E669 Obesity, unspecified: Secondary | ICD-10-CM

## 2020-10-23 DIAGNOSIS — F331 Major depressive disorder, recurrent, moderate: Secondary | ICD-10-CM

## 2020-10-23 MED ORDER — ATORVASTATIN CALCIUM 20 MG PO TABS: 20.0000 mg | ORAL_TABLET | Freq: Every day | ORAL | 3 refills | Status: DC

## 2020-10-23 MED ORDER — ZOSTER VAC RECOMB ADJUVANTED 50 MCG/0.5ML IM SUSR: 0.5000 mL | Freq: Once | INTRAMUSCULAR | 1 refills | Status: AC

## 2020-10-23 MED ORDER — METFORMIN HCL ER 500 MG PO TB24: 500.0000 mg | ORAL_TABLET | Freq: Every day | ORAL | 1 refills | Status: DC

## 2020-10-23 MED ORDER — BUPROPION HCL ER (XL) 150 MG PO TB24: 150.0000 mg | ORAL_TABLET | Freq: Every day | ORAL | 1 refills | Status: DC

## 2020-10-23 NOTE — Progress Notes (Signed)
Subjective:  Patient ID: [name], male    DOB: 12/29/1965  Age: 55 y.o. MRN: [id number]  CC: The primary encounter diagnosis was COVID-19 vaccine administered. Diagnoses of Diabetes mellitus type 2 in obese ([place]), OSA (obstructive sleep apnea), Diabetic polyneuropathy associated with type 2 diabetes mellitus ([place]), Diabetic ulcer of right midfoot associated with diabetes mellitus due to underlying condition, with fat layer exposed ([place]), S/P transmetatarsal amputation of foot, right ([place]), and Moderate episode of recurrent major depressive disorder ([place]) were also pertinent to this visit.  HPI AXIEL FJELD presents for follow up on type 2 DM with peripheral neuropathy, polycythemia  This visit occurred during the SARS-CoV-2 public health emergency.  Safety protocols were in place, including screening questions prior to the visit, additional usage of staff PPE, and extensive cleaning of exam room while observing appropriate contact time as indicated for disinfecting solutions.   Type 2 DM uncontrolled , with obesity and  neuropathy.  . Not taking meds.  Has not checked blood sugars since last visit.   Neuropathy:  No longer  Taking Lyrica or Effexor , due to hypersomnolence, but not having significant pain on a daily basis,  Using oxycodone prn qhs pain.  Still dealing with an unhealing pressure ulcer on right foot.    Polycythemia:  Resolved on repeat CBC . Per patient , it is chronic (his former PCP told him to donate blood often because he has thick blood but never discussed the consequences )  ..has severe untreated OSA . Obesity: not exercising due to fear of gym  And right foot ulcer   "I am super depressed"  Due to multiple family and work stressors .  Feels fatigued by mid day,  Foot still not healing,  Not suicidal  Neuropathy:  Non healing  right foot ulcer.  Told by surgeon that he needs to be out of work for  6 weeks  to heal   Post COVID:  Discussed the utility of the  Booster in POST covid patients    Outpatient Medications Prior to Visit  Medication Sig Dispense Refill  . acyclovir (ZOVIRAX) 400 MG tablet Take 1 tablet by mouth as needed.  0  . albuterol (VENTOLIN HFA) 108 (90 Base) MCG/ACT inhaler TAKE 2 PUFFS BY MOUTH EVERY 6 HOURS AS NEEDED FOR WHEEZE OR SHORTNESS OF BREATH 8 g 0  . Multiple Vitamins-Minerals (MULTIVITAMIN WITH MINERALS) tablet Take 1 tablet by mouth daily.    Marland Kitchen oxyCODONE (ROXICODONE) 5 MG immediate release tablet Take 1 tablet (5 mg total) by mouth at bedtime as needed for severe pain. 30 tablet 0  . pregabalin (LYRICA) 75 MG capsule Take 2 capsules (150 mg total) by mouth 3 (three) times daily. 180 capsule 5  . pravastatin (PRAVACHOL) 40 MG tablet Take 1 tablet (40 mg total) by mouth daily. 90 tablet 1  . venlafaxine (EFFEXOR) 37.5 MG tablet Take 37.5 mg by mouth 2 (two) times daily.     No facility-administered medications prior to visit.    Review of Systems;  Patient denies headache, fevers, malaise, unintentional weight loss, skin rash, eye pain, sinus congestion and sinus pain, sore throat, dysphagia,  hemoptysis , cough, dyspnea, wheezing, chest pain, palpitations, orthopnea, edema, abdominal pain, nausea, melena, diarrhea, constipation, flank pain, dysuria, hematuria, urinary  Frequency, nocturia, numbness, tingling, seizures,  Focal weakness, Loss of consciousness,  Tremor, insomnia, depression, anxiety, and suicidal ideation.      Objective:  BP 122/70 (BP Location: Left Arm, Patient Position:  Sitting)   Pulse (!) 102   Temp 97.8 F (36.6 C)   Ht 5\' 10"  (1.778 m)   Wt 236 lb 12.8 oz (107.4 kg)   SpO2 97%   BMI 33.98 kg/m   BP Readings from Last 3 Encounters:  10/23/20 122/70  10/19/20 136/86  11/27/19 110/66    Wt Readings from Last 3 Encounters:  10/23/20 236 lb 12.8 oz (107.4 kg)  10/19/20 234 lb 14.4 oz (106.5 kg)  09/18/20 238 lb (108 kg)    General appearance: alert, cooperative and appears stated  age Ears: normal TM's and external ear canals both ears Throat: lips, mucosa, and tongue normal; teeth and gums normal Neck: no adenopathy, no carotid bruit, supple, symmetrical, trachea midline and thyroid not enlarged, symmetric, no tenderness/mass/nodules Back: symmetric, no curvature. ROM normal. No CVA tenderness. Lungs: clear to auscultation bilaterally Heart: regular rate and rhythm, S1, S2 normal, no murmur, click, rub or gallop Abdomen: soft, non-tender; bowel sounds normal; no masses,  no organomegaly Pulses: 2+ and symmetric Skin: Skin color, texture, turgor normal. No rashes or lesions Lymph nodes: Cervical, supraclavicular, and axillary nodes normal.  Lab Results  Component Value Date   HGBA1C 9.3 (H) 10/01/2020   HGBA1C 5.8 11/27/2019   HGBA1C 5.1 02/15/2019    Lab Results  Component Value Date   CREATININE 0.80 10/01/2020   CREATININE 0.85 11/27/2019   CREATININE 0.89 02/15/2019    Lab Results  Component Value Date   WBC 10.1 10/19/2020   HGB 15.0 10/19/2020   HCT 45.2 10/19/2020   PLT 253 10/19/2020   GLUCOSE 217 (H) 10/01/2020   CHOL 175 10/01/2020   TRIG 181.0 (H) 10/01/2020   HDL 37.30 (L) 10/01/2020   LDLDIRECT 121.0 11/27/2019   LDLCALC 102 (H) 10/01/2020   ALT 52 10/01/2020   AST 34 10/01/2020   NA 136 10/01/2020   K 4.1 10/01/2020   CL 101 10/01/2020   CREATININE 0.80 10/01/2020   BUN 11 10/01/2020   CO2 26 10/01/2020   TSH 0.78 12/04/2015   PSA 0.29 11/27/2019   INR 1.3 (A) 04/08/2017   HGBA1C 9.3 (H) 10/01/2020   MICROALBUR 6.9 (H) 10/01/2020    No results found.  Assessment & Plan:   Problem List Items Addressed This Visit      Unprioritized   S/P transmetatarsal amputation of foot, right ([place])    Secondary to chronic osteomyelitis due to nonhealing diabetic pressure ulcer. This occurred over 1.5 yrs ago. He is under the care of [facility] for persistent right midfoot ulcer .  Failure to heal is due to nonadherence to non  weight bearing.   Should continue local wound care and reduced and limited weightbearing in the interim. Must have 4 to 6 weeks nonweightbearing on the foot for this to work        OSA (obstructive sleep apnea)    Untreated due to noncoverage by insurance in 2018.  Likely the cause of his polycythemia and daytime fatigue .  He has deferred repeat testing       Major depressive disorder, recurrent ([place])    resuming wellbutrin .  He is not suicidal  Return one month       Relevant Medications   buPROPion (WELLBUTRIN XL) 150 MG 24 hr tablet   Diabetic ulcer of foot associated with diabetes mellitus due to underlying condition, with fat layer exposed ([place])    Now managed by [facility].  Per last note Jan 2022:  "  ongoing management of a chronic neuropathic right midfoot ulcer in the setting of distant transmetatarsal amputation attempt at bone resection and closure now well over a year and a half ago. Had attempt at xenograft placement a couple weeks ago but still excess weightbearing against advice and prohibiting healing.   Should consider additional surgical intervention but with likely full-thickness grafting and further bone resection of what looks like on his old films of either the base the first metatarsal or cuneiform involvement. Either way, while this may cause complication in tendon insertion that can be repaired at the time. Should continue local wound care and reduced and limited weightbearing in the interim. Must have 4 to 6 weeks nonweightbearing on the foot for this to work."        Relevant Medications   metFORMIN (GLUCOPHAGE XR) 500 MG 24 hr tablet   atorvastatin (LIPITOR) 20 MG tablet   Diabetic neuropathy associated with type 2 diabetes mellitus ([place])    Starting metformin immediately for loss of  control of diabetes and weight gain. HE WILL NEED INSULIN but has not provided any  data on glycemic control. CCM referral in process for pharmaceutical assistance with  obtaining FreeStyle Libre monitor and initiation of insulin therapy  His neuropathic  pain is managed with  oxycodone for use at bedtime if needed. He has stopped the lyrica due to excessive daytime sedation complicated by untreated OSA. He has chronic right foot neuropathic ulceration status post distant transmetatarsal amputation and graft application due to the nature of his job patient is on his feet at least 8 hours a day between standing, driving and ambulating.   Lab Results  Component Value Date   HGBA1C 9.3 (H) 10/01/2020           Relevant Medications   metFORMIN (GLUCOPHAGE XR) 500 MG 24 hr tablet   atorvastatin (LIPITOR) 20 MG tablet   Other Relevant Orders   AMB Referral to [facility]   RESOLVED: Diabetes mellitus type 2 in obese ([facility])   Relevant Medications   metFORMIN (GLUCOPHAGE XR) 500 MG 24 hr tablet   atorvastatin (LIPITOR) 20 MG tablet   Other Relevant Orders   Hemoglobin A1c   Comprehensive metabolic panel   AMB Referral to [employer]    Other Visit Diagnoses    COVID-19 vaccine administered    -  Primary   Relevant Orders   SARS-CoV-2 Semi-Quantitative Total Antibody, Spike      I have discontinued Arlynn C. Streat's pravastatin and venlafaxine. I am also having him start on metFORMIN, atorvastatin, buPROPion, and Zoster Vaccine Adjuvanted. Additionally, I am having him maintain his multivitamin with minerals, acyclovir, albuterol, pregabalin, and oxyCODONE.  Meds ordered this encounter  Medications  . metFORMIN (GLUCOPHAGE XR) 500 MG 24 hr tablet    Sig: Take 1 tablet (500 mg total) by mouth daily with breakfast.    Dispense:  90 tablet    Refill:  1  . atorvastatin (LIPITOR) 20 MG tablet    Sig: Take 1 tablet (20 mg total) by mouth daily.    Dispense:  90 tablet    Refill:  3  . buPROPion (WELLBUTRIN XL) 150 MG 24 hr tablet    Sig: Take 1 tablet (150 mg total) by mouth daily.    Dispense:  30 tablet    Refill:  1   . Zoster Vaccine Adjuvanted [facility]) injection    Sig: Inject 0.5 mLs into the muscle once for 1 dose.  Dispense:  1 each    Refill:  1  A total of 40 minutes was spent with patient more than half of which was spent in counseling patient on the above mentioned issues , reviewing and explaining recent labs and imaging studies done, and coordination of care.   Medications Discontinued During This Encounter  Medication Reason  . pravastatin (PRAVACHOL) 40 MG tablet   . pravastatin (PRAVACHOL) 40 MG tablet   . venlafaxine (EFFEXOR) 37.5 MG tablet     Follow-up: Return in about 2 months (around 12/28/2020).   Crecencio Mc, MD

## 2020-10-23 NOTE — Patient Instructions (Addendum)
Starting metformin  For your diabetes   Check sugars two times every day at different times and send me a log in 2 weeks   1) Fasting  2) Post prandial (after a meal)   For your depression  Starting wellbutrin once daily in the morning   For your cholesterol:  Changing prAvastatin to atorvastatin   YOUR  FATIGUE AND SLEEPINESS MAY IMPROVE BUT NOT RESOLVE UNTIL THE SLEEP APNEA IS ADDRESSED

## 2020-10-25 DIAGNOSIS — Z89431 Acquired absence of right foot: Secondary | ICD-10-CM | POA: Insufficient documentation

## 2020-10-25 DIAGNOSIS — F339 Major depressive disorder, recurrent, unspecified: Secondary | ICD-10-CM | POA: Insufficient documentation

## 2020-10-25 NOTE — Assessment & Plan Note (Addendum)
Now managed by [facility].  Per last note Jan 2022:  "ongoing management of a chronic neuropathic right midfoot ulcer in the setting of distant transmetatarsal amputation attempt at bone resection and closure now well over a year and a half ago. Had attempt at xenograft placement a couple weeks ago but still excess weightbearing against advice and prohibiting healing.   Should consider additional surgical intervention but with likely full-thickness grafting and further bone resection of what looks like on his old films of either the base the first metatarsal or cuneiform involvement. Either way, while this may cause complication in tendon insertion that can be repaired at the time. Should continue local wound care and reduced and limited weightbearing in the interim. Must have 4 to 6 weeks nonweightbearing on the foot for this to work."

## 2020-10-25 NOTE — Assessment & Plan Note (Signed)
Untreated due to noncoverage by insurance in 2018.  Likely the cause of his polycythemia and daytime fatigue .  He has deferred repeat testing

## 2020-10-25 NOTE — Assessment & Plan Note (Signed)
resuming wellbutrin .  He is not suicidal  Return one month

## 2020-10-25 NOTE — Assessment & Plan Note (Signed)
Secondary to chronic osteomyelitis due to nonhealing diabetic pressure ulcer. This occurred over 1.5 yrs ago. He is under the care of [facility] for persistent right midfoot ulcer .  Failure to heal is due to nonadherence to non weight bearing.   Should continue local wound care and reduced and limited weightbearing in the interim. Must have 4 to 6 weeks nonweightbearing on the foot for this to work

## 2020-10-25 NOTE — Assessment & Plan Note (Addendum)
Starting metformin immediately for loss of  control of diabetes and weight gain. HE WILL NEED INSULIN but has not provided any  data on glycemic control. CCM referral in process for pharmaceutical assistance with obtaining FreeStyle Libre monitor and initiation of insulin therapy  His neuropathic  pain is managed with  oxycodone for use at bedtime if needed. He has stopped the lyrica due to excessive daytime sedation complicated by untreated OSA. He has chronic right foot neuropathic ulceration status post distant transmetatarsal amputation and graft application due to the nature of his job patient is on his feet at least 8 hours a day between standing, driving and ambulating.   Lab Results  Component Value Date   HGBA1C 9.3 (H) 10/01/2020

## 2020-10-26 ENCOUNTER — Telehealth: Payer: Self-pay

## 2020-10-26 DIAGNOSIS — E11621 Type 2 diabetes mellitus with foot ulcer: Secondary | ICD-10-CM | POA: Diagnosis not present

## 2020-10-26 DIAGNOSIS — Z89431 Acquired absence of right foot: Secondary | ICD-10-CM | POA: Diagnosis not present

## 2020-10-26 DIAGNOSIS — E1140 Type 2 diabetes mellitus with diabetic neuropathy, unspecified: Secondary | ICD-10-CM | POA: Diagnosis not present

## 2020-10-26 DIAGNOSIS — R768 Other specified abnormal immunological findings in serum: Secondary | ICD-10-CM | POA: Diagnosis not present

## 2020-10-26 DIAGNOSIS — L97512 Non-pressure chronic ulcer of other part of right foot with fat layer exposed: Secondary | ICD-10-CM | POA: Diagnosis not present

## 2020-10-26 NOTE — Chronic Care Management (AMB) (Unsigned)
  Care Management   Outreach Note  10/26/2020 Name: [name] MRN: [id number] DOB: 1966/04/09  Referred by: Crecencio Mc, MD Reason for referral : Care Coordination (Outreach to schedule referral with Pharm D )   An unsuccessful telephone outreach was attempted today. The patient was referred to the case management team for assistance with care management and care coordination.   Follow Up Plan: A HIPAA compliant phone message was left for the patient providing contact information and requesting a return call.  The care management team will reach out to the patient again over the next 3 days.  If patient returns call to provider office, please advise to call [facility]  at [place], [address], [place], [place] [address] Direct Dial: [phone] [email or web address] Website: [email or web address]

## 2020-10-28 NOTE — Chronic Care Management (AMB) (Signed)
  Care Management   Outreach Note  10/28/2020 Name: [name] MRN: [id number] DOB: 01-11-1966  Referred by: Crecencio Mc, MD Reason for referral : Care Coordination (Outreach to schedule referral with Pharm D /Second Outreach to schedule referral with Pharm D )   A second unsuccessful telephone outreach was attempted today. The patient was referred to the case management team for assistance with care management and care coordination.   Follow Up Plan: A HIPAA compliant phone message was left for the patient providing contact information and requesting a return call.  The care management team will reach out to the patient again over the next 4 days.  If patient returns call to provider office, please advise to call [facility]  at [place], [address], [place], [place] [address] Direct Dial: [phone] [email or web address] Website: [email or web address]

## 2020-10-28 NOTE — Chronic Care Management (AMB) (Signed)
  Care Management   Note  10/28/2020 Name: [name] MRN: [id number] DOB: 1965/09/02  [name] is a 55 y.o. year old male who is a primary care patient of Derrel Nip, Aris Everts, MD. I reached out to York Pellant by phone today in response to a referral sent by [name] health plan.    [name] was given information about care management services today including:  1. Care management services include personalized support from designated clinical staff supervised by his physician, including individualized plan of care and coordination with other care providers 2. 24/7 contact phone numbers for assistance for urgent and routine care needs. 3. The patient may stop care management services at any time by phone call to the office staff.  Patient agreed to services and verbal consent obtained.   Follow up plan: Telephone appointment with care management team member scheduled for:11/09/2020  Noreene Larsson, [place], [place], [place] [address] Direct Dial: [phone] [email or web address] Website: [email or web address]

## 2020-10-29 LAB — JAK2 GENOTYPR

## 2020-11-10 ENCOUNTER — Telehealth: Payer: Self-pay

## 2020-11-10 NOTE — Chronic Care Management (AMB) (Signed)
  Care Management   Note  11/10/2020 Name: [name] MRN: [id number] DOB: 1966/07/27  [name] is a 55 y.o. year old male who is a primary care patient of Derrel Nip, Aris Everts, MD and is actively engaged with the care management team. I reached out to York Pellant by phone today to assist with re-scheduling an initial visit with the Pharmacist  Follow up plan: Unsuccessful telephone outreach attempt made. A HIPAA compliant phone message was left for the patient providing contact information and requesting a return call.  The care management team will reach out to the patient again over the next 1 days.  If patient returns call to provider office, please advise to call [facility]  at [place], [place], [place], [place] [address] Direct Dial: [phone] [email or web address] Website: [email or web address]

## 2020-11-11 ENCOUNTER — Telehealth: Payer: Self-pay | Admitting: Pharmacist

## 2020-11-11 ENCOUNTER — Ambulatory Visit: Payer: BC Managed Care – PPO | Admitting: Internal Medicine

## 2020-11-11 NOTE — Telephone Encounter (Signed)
  Chronic Care Management   Note  11/11/2020 Name: [name] MRN: [id number] DOB: [birth year]   Attempted to contact patient to reschedule appointment for medication management support. Planned to speak with him today if he was available. Left HIPAA compliant message for patient to return my call at their convenience.    Plan: - If I do not hear back from the patient by end of business today, will collaborate with Care Guide to outreach to schedule follow up with me. Will also send MyChart message, as this appears to be a successful method of communication with him   Catie Darnelle Maffucci, PharmD, [facility], CPP Contractor at [facility]

## 2020-11-11 NOTE — Chronic Care Management (AMB) (Signed)
  Care Management   Note  11/11/2020 Name: [name] MRN: [id number] DOB: 26-Aug-1966  [name] is a 55 y.o. year old male who is a primary care patient of Derrel Nip, Aris Everts, MD and is actively engaged with the care management team. I reached out to York Pellant by phone today to assist with re-scheduling a follow up visit with the Pharmacist  Follow up plan: Unsuccessful telephone outreach attempt made. A HIPAA compliant phone message was left for the patient providing contact information and requesting a return call.  The care management team will reach out to the patient again over the next 1 days.  If patient returns call to provider office, please advise to call [facility]  at [place], [place], [place], [place] [address] Direct Dial: [phone] [email or web address] Website: [email or web address]

## 2020-11-12 ENCOUNTER — Telehealth: Payer: BC Managed Care – PPO

## 2020-11-12 ENCOUNTER — Other Ambulatory Visit: Payer: Self-pay

## 2020-11-12 ENCOUNTER — Inpatient Hospital Stay: Payer: BC Managed Care – PPO | Attending: Oncology | Admitting: Oncology

## 2020-11-16 DIAGNOSIS — E11621 Type 2 diabetes mellitus with foot ulcer: Secondary | ICD-10-CM | POA: Diagnosis not present

## 2020-11-16 DIAGNOSIS — L97512 Non-pressure chronic ulcer of other part of right foot with fat layer exposed: Secondary | ICD-10-CM | POA: Diagnosis not present

## 2020-11-16 DIAGNOSIS — S93301S Unspecified subluxation of right foot, sequela: Secondary | ICD-10-CM | POA: Diagnosis not present

## 2020-11-16 DIAGNOSIS — X58XXXS Exposure to other specified factors, sequela: Secondary | ICD-10-CM | POA: Diagnosis not present

## 2020-11-16 DIAGNOSIS — T8789 Other complications of amputation stump: Secondary | ICD-10-CM | POA: Diagnosis not present

## 2020-11-20 NOTE — Chronic Care Management (AMB) (Signed)
  Care Management   Note  11/20/2020 Name: SHINICHI ANGUIANO MRN: [id number] DOB: 1965-11-27  MEDFORD STAHELI is a 55 y.o. year old male who is a primary care patient of Derrel Nip, Aris Everts, MD and is actively engaged with the care management team. I reached out to York Pellant by phone today to assist with re-scheduling an initial visit with the Pharmacist  Follow up plan: Unsuccessful telephone outreach attempt made. A HIPAA compliant phone message was left for the patient providing contact information and requesting a return call.  The care management team will reach out to the patient again over the next 7 days.  If patient returns call to provider office, please advise to call [facility]  at [place], [place], [place], [place] [address] Direct Dial: [phone] [email or web address] Website: [email or web address]

## 2020-11-24 DIAGNOSIS — H00021 Hordeolum internum right upper eyelid: Secondary | ICD-10-CM | POA: Diagnosis not present

## 2020-11-25 NOTE — Chronic Care Management (AMB) (Signed)
  Care Management   Note  11/25/2020 Name: [name] MRN: [id number] DOB: November 23, 1965  [name] is a 55 y.o. year old male who is a primary care patient of Derrel Nip, Aris Everts, MD and is actively engaged with the care management team. I reached out to York Pellant by phone today to assist with re-scheduling a follow up visit with the Pharmacist  Follow up plan: Unable to make contact on outreach attempts x 3. PCP Crecencio Mc, MD notified via routed documentation in medical record.  We have been unable to make contact with the patient for follow up. The care management team is available to follow up with the patient after provider conversation with the patient regarding recommendation for care management engagement and subsequent re-referral to the care management team.   Noreene Larsson, [place] [address], [place], [place] [address] Direct Dial: [phone] [email or web address] Website: [email or web address]

## 2020-11-26 ENCOUNTER — Other Ambulatory Visit: Payer: Self-pay

## 2020-11-26 MED ORDER — OXYCODONE HCL 5 MG PO TABS
5.0000 mg | ORAL_TABLET | Freq: Every evening | ORAL | 0 refills | Status: DC | PRN
Start: 2020-11-26 — End: 2020-12-26

## 2020-11-26 NOTE — Telephone Encounter (Signed)
RX Refill:oxycodone Last Seen:10-23-20 Last ordered:10-21-20

## 2020-12-08 ENCOUNTER — Encounter: Payer: Self-pay | Admitting: Internal Medicine

## 2020-12-26 ENCOUNTER — Other Ambulatory Visit: Payer: Self-pay

## 2020-12-28 ENCOUNTER — Other Ambulatory Visit: Payer: Self-pay | Admitting: Internal Medicine

## 2020-12-28 MED ORDER — OXYCODONE HCL 5 MG PO TABS: 5.0000 mg | ORAL_TABLET | Freq: Every evening | ORAL | 0 refills | Status: DC | PRN

## 2020-12-28 NOTE — Telephone Encounter (Signed)
RX Refill:oxycodone Last Seen:10-23-20 Last ordered:11-26-20

## 2020-12-31 DIAGNOSIS — X58XXXS Exposure to other specified factors, sequela: Secondary | ICD-10-CM | POA: Diagnosis not present

## 2020-12-31 DIAGNOSIS — L97512 Non-pressure chronic ulcer of other part of right foot with fat layer exposed: Secondary | ICD-10-CM | POA: Diagnosis not present

## 2020-12-31 DIAGNOSIS — M21171 Varus deformity, not elsewhere classified, right ankle: Secondary | ICD-10-CM | POA: Diagnosis not present

## 2020-12-31 DIAGNOSIS — E11621 Type 2 diabetes mellitus with foot ulcer: Secondary | ICD-10-CM | POA: Diagnosis not present

## 2020-12-31 DIAGNOSIS — S93301S Unspecified subluxation of right foot, sequela: Secondary | ICD-10-CM | POA: Diagnosis not present

## 2020-12-31 DIAGNOSIS — E1140 Type 2 diabetes mellitus with diabetic neuropathy, unspecified: Secondary | ICD-10-CM | POA: Diagnosis not present

## 2020-12-31 DIAGNOSIS — R768 Other specified abnormal immunological findings in serum: Secondary | ICD-10-CM | POA: Diagnosis not present

## 2021-01-01 ENCOUNTER — Ambulatory Visit: Payer: BC Managed Care – PPO | Admitting: Internal Medicine

## 2021-01-06 ENCOUNTER — Other Ambulatory Visit: Payer: Self-pay

## 2021-01-06 ENCOUNTER — Ambulatory Visit: Payer: BC Managed Care – PPO | Admitting: Internal Medicine

## 2021-01-06 ENCOUNTER — Encounter: Payer: Self-pay | Admitting: Internal Medicine

## 2021-01-06 VITALS — BP 112/66 | HR 81 | Temp 98.3°F | Ht 70.0 in | Wt 233.2 lb

## 2021-01-06 DIAGNOSIS — F331 Major depressive disorder, recurrent, moderate: Secondary | ICD-10-CM

## 2021-01-06 DIAGNOSIS — E1169 Type 2 diabetes mellitus with other specified complication: Secondary | ICD-10-CM

## 2021-01-06 DIAGNOSIS — E1142 Type 2 diabetes mellitus with diabetic polyneuropathy: Secondary | ICD-10-CM | POA: Diagnosis not present

## 2021-01-06 DIAGNOSIS — E08621 Diabetes mellitus due to underlying condition with foot ulcer: Secondary | ICD-10-CM

## 2021-01-06 DIAGNOSIS — L97412 Non-pressure chronic ulcer of right heel and midfoot with fat layer exposed: Secondary | ICD-10-CM

## 2021-01-06 DIAGNOSIS — E669 Obesity, unspecified: Secondary | ICD-10-CM

## 2021-01-06 LAB — POCT GLYCOSYLATED HEMOGLOBIN (HGB A1C): Hemoglobin A1C: 6.9 % — AB (ref 4.0–5.6)

## 2021-01-06 NOTE — Patient Instructions (Signed)
STAY OFF THE RIGHT FOOT!   THIS IS NOT NEGOTIABLE   RESUME METFORMIN AND ATORVASTATIN     DELAY OZEMPIC START UNTIL CATIE TOUCHES BASE WITH YOU

## 2021-01-06 NOTE — Progress Notes (Signed)
Subjective:  Patient ID: [name], male    DOB: [birth year]  Age: 55 y.o. MRN: [id number]  CC: The primary encounter diagnosis was Diabetes mellitus type 2 in obese ([place]). 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  HPI [name] presents for follow up on type 2 DM with severe neuropathy . Hyperlipidemia, and obesity.  This visit occurred during the SARS-CoV-2 public health emergency.  Safety protocols were in place, including screening questions prior to the visit, additional usage of staff PPE, and extensive cleaning of exam room while observing appropriate contact time as indicated for disinfecting solutions.    DM:  Recent A1c noted significant loss of control.  He states that his fasting sugars are higher than his pre lunch sugars.  fastings 120 to 150 (this week only).  Average of  105 before lunch.  Has not been taking metformin XR  or atorvastatin  Because of concern for polypharmacy ("I had a sinus infection,  Then I forgot to restart them ." ) .  He changed his diet to high protein , gave up junk food, frozen custard and desserts.   Eating more yogurt and eggs. He has dropped his a1c from 9.3 to 6.9.  His motivation was in part the need for tighter glycemic control requested by his podiatrist prior to a needed flap closure on his nonhealing pressure ulcer.   HAVING SURGERY IN 2 WEEKS  For nonhealing diabetic ulcer on right foot.  HAS BEEN POSTPONED a couple of weeks to accommodate wife's schedule so she could be around to help.  NEW REPORT of  altered sensation in fingers, described as  tingling alternating with aching.  Has not had EMG/[place] studies but sees Dr Manuella Ghazi for follow up on neuropathy.  Dr Manuella Ghazi has apparently  ordered a rheumatology referral but he has not been called .    Long discussion  about why his foot is not healing.  He admits that he has not been avoiding "weight bearing" status as previously explained.  He has a knee scooter but is not using it.   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  He does not have PAD     Outpatient Medications Prior to Visit  Medication Sig Dispense Refill  . acyclovir (ZOVIRAX) 400 MG tablet Take 1 tablet by mouth as needed.  0  . albuterol (VENTOLIN HFA) 108 (90 Base) MCG/ACT inhaler TAKE 2 PUFFS BY MOUTH EVERY 6 HOURS AS NEEDED FOR WHEEZE OR SHORTNESS OF BREATH 8 g 0  . atorvastatin (LIPITOR) 20 MG tablet Take 1 tablet (20 mg total) by mouth daily. 90 tablet 3  . metFORMIN (GLUCOPHAGE XR) 500 MG 24 hr tablet Take 1 tablet (500 mg total) by mouth daily with breakfast. 90 tablet 1  . Multiple Vitamins-Minerals (MULTIVITAMIN WITH MINERALS) tablet Take 1 tablet by mouth daily.    Marland Kitchen oxyCODONE (ROXICODONE) 5 MG immediate release tablet Take 1 tablet (5 mg total) by mouth at bedtime as needed for severe pain. 30 tablet 0  . pregabalin (LYRICA) 75 MG capsule Take 2 capsules (150 mg total) by mouth 3 (three) times daily. (Patient not taking: Reported on 01/06/2021) 180 capsule 5  . buPROPion (WELLBUTRIN XL) 150 MG 24 hr  tablet Take 1 tablet (150 mg total) by mouth daily. (Patient not taking: Reported on 01/06/2021) 30 tablet 1   No facility-administered medications prior to visit.    Review of Systems;  Patient denies headache, fevers, malaise, unintentional weight loss, skin rash, eye pain, sinus congestion and sinus pain, sore throat, dysphagia,  hemoptysis , cough, dyspnea, wheezing, chest pain, palpitations, orthopnea, edema, abdominal pain, nausea, melena, diarrhea, constipation, flank pain, dysuria, hematuria, urinary  Frequency, nocturia,  seizures,  Focal weakness, Loss of consciousness,  Tremor, insomnia, depression, anxiety, and  suicidal ideation.      Objective:  BP 112/66 (BP Location: Left Arm, Patient Position: Sitting, Cuff Size: Large)   Pulse 81   Temp 98.3 F (36.8 C) (Oral)   Ht 5\' 10"  (1.778 m)   Wt 233 lb 3.2 oz (105.8 kg)   SpO2 97%   BMI 33.46 kg/m   BP Readings from Last 3 Encounters:  01/06/21 112/66  10/23/20 122/70  10/19/20 136/86    Wt Readings from Last 3 Encounters:  01/06/21 233 lb 3.2 oz (105.8 kg)  10/23/20 236 lb 12.8 oz (107.4 kg)  10/19/20 234 lb 14.4 oz (106.5 kg)    General appearance: alert, cooperative and appears stated age Ears: normal TM's and external ear canals both ears Throat: lips, mucosa, and tongue normal; teeth and gums normal Neck: no adenopathy, no carotid bruit, supple, symmetrical, trachea midline and thyroid not enlarged, symmetric, no tenderness/mass/nodules Back: symmetric, no curvature. ROM normal. No CVA tenderness. Lungs: clear to auscultation bilaterally Heart: regular rate and rhythm, S1, S2 normal, no murmur, click, rub or gallop Abdomen: soft, non-tender; bowel sounds normal; no masses,  no organomegaly Pulses: 2+ and symmetric Skin: Skin color, texture, turgor normal. No rashes or lesions Lymph nodes: Cervical, supraclavicular, and axillary nodes normal. Ext:  Right foot in surgical shoe.   Lab Results  Component Value Date   HGBA1C 6.9 (A) 01/06/2021   HGBA1C 9.3 (H) 10/01/2020   HGBA1C 5.8 11/27/2019    Lab Results  Component Value Date   CREATININE 0.80 10/01/2020   CREATININE 0.85 11/27/2019   CREATININE 0.89 02/15/2019    Lab Results  Component Value Date   WBC 10.1 10/19/2020   HGB 15.0 10/19/2020   HCT 45.2 10/19/2020   PLT 253 10/19/2020   GLUCOSE 217 (H) 10/01/2020   CHOL 175 10/01/2020   TRIG 181.0 (H) 10/01/2020   HDL 37.30 (L) 10/01/2020   LDLDIRECT 121.0 11/27/2019   LDLCALC 102 (H) 10/01/2020   ALT 52 10/01/2020   AST 34 10/01/2020   NA 136 10/01/2020   K 4.1 10/01/2020   CL 101 10/01/2020    CREATININE 0.80 10/01/2020   BUN 11 10/01/2020   CO2 26 10/01/2020   TSH 0.78 12/04/2015   PSA 0.29 11/27/2019   INR 1.3 (A) 04/08/2017   HGBA1C 6.9 (A) 01/06/2021   MICROALBUR 6.9 (H) 10/01/2020    No results found.  Assessment & Plan:   Problem List Items Addressed This Visit      Unprioritized   Diabetic neuropathy associated with type 2 diabetes mellitus ([place])    He is now developing symptoms in both hands.  Encouraged to follow up with his neurologist, dr Manuella Ghazi for EMG/[place] studies .  Glycemic control has improved with dietary restraint only  ( he stopped metformin).  Advised to resume metformin and atorvastatin. Discussed rationale for starting ozempic following referral to [facility] for help in managing obesity.  He has stopped Lyrica as it  has not helped to alleviate his pain .  Will Continue  management of nocturnal foot pain with oxycodone. Refill history confirmed via [place] Controlled Substance databas, accessed by me today..  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   The surgery planned is tendon transfer partial cuneiform resection, ulcer excision and closure with flap  I reviewed the history of his foot ulcer.  I warned him that he is in danger of losing his foot ultimately  if he does not make lifestyle changes and adhere to a program of NON WEIGHT BEARING on the right foot . Reviewed most recent notes ,  Including notes from May and from January 2022:  "ongoing management of a chronic neuropathic right midfoot ulcer in the setting of distant transmetatarsal amputation attempt at bone resection and closure now well over a year and a half ago. Had attempt at xenograft  placement a couple weeks ago but still excess weightbearing against advice and prohibiting healing.   Should consider additional surgical intervention but with likely full-thickness grafting and further bone resection of what looks like on his old films of either the base the first metatarsal or cuneiform involvement. Either way, while this may cause complication in tendon insertion that can be repaired at the time. Should continue local wound care and reduced and limited weightbearing in the interim. Must have 4 to 6 weeks nonweightbearing on the foot for this to work."        Major depressive disorder, recurrent ([place])    Advised to resume wellbutrin .  He is not suicidal.   Return one month       Relevant Medications   buPROPion (WELLBUTRIN XL) 150 MG 24 hr tablet    Other Visit Diagnoses    Diabetes mellitus type 2 in obese ([place])    -  Primary   Relevant Orders   POCT HgB A1C (Completed)     I provided  30 minutes of  face-to-face time during this encounter reviewing patient's current non healing diabetic ulcer,  counselling on his need to adhere  to his surgeons management plan for healing his ulcer,  Current diabetic control,  and coordination  of care .  I am having Steffan C. Armond maintain his multivitamin with minerals, acyclovir, albuterol, pregabalin, metFORMIN, atorvastatin, oxyCODONE, and buPROPion.  Meds ordered this encounter  Medications  . oxyCODONE (ROXICODONE) 5 MG immediate release tablet    Sig: Take 1 tablet (5 mg total) by mouth at bedtime as needed for severe pain.    Dispense:  30 tablet    Refill:  0  . buPROPion (WELLBUTRIN XL) 150 MG 24 hr tablet    Sig: Take 1 tablet (150 mg total) by mouth daily.    Dispense:  30 tablet    Refill:  1    Medications Discontinued During This Encounter  Medication Reason  . oxyCODONE (ROXICODONE) 5 MG immediate release tablet Reorder  . buPROPion (WELLBUTRIN XL) 150 MG 24 hr tablet Reorder    Follow-up: No follow-ups  on file.   Crecencio Mc, MD

## 2021-01-09 MED ORDER — OXYCODONE HCL 5 MG PO TABS: 5.0000 mg | ORAL_TABLET | Freq: Every evening | ORAL | 0 refills | Status: DC | PRN

## 2021-01-09 MED ORDER — BUPROPION HCL ER (XL) 150 MG PO TB24: 150.0000 mg | ORAL_TABLET | Freq: Every day | ORAL | 1 refills | Status: DC

## 2021-01-09 NOTE — Assessment & Plan Note (Addendum)
I confronted him about his  Non adherence to the advice of his surgeon,  Which has directly  led to the enlarging of his wound and subsequent  need for more surgery.   The surgery planned is tendon transfer partial cuneiform resection, ulcer excision and closure with flap  I reviewed the history of his foot ulcer.  I warned him that he is in danger of losing his foot ultimately  if he does not make lifestyle changes and adhere to a program of NON WEIGHT BEARING on the right foot . Reviewed most recent notes ,  Including notes from May and from January 2022:  "ongoing management of a chronic neuropathic right midfoot ulcer in the setting of distant transmetatarsal amputation attempt at bone resection and closure now well over a year and a half ago. Had attempt at xenograft placement a couple weeks ago but still excess weightbearing against advice and prohibiting healing.   Should consider additional surgical intervention but with likely full-thickness grafting and further bone resection of what looks like on his old films of either the base the first metatarsal or cuneiform involvement. Either way, while this may cause complication in tendon insertion that can be repaired at the time. Should continue local wound care and reduced and limited weightbearing in the interim. Must have 4 to 6 weeks nonweightbearing on the foot for this to work."

## 2021-01-09 NOTE — Assessment & Plan Note (Signed)
Advised to resume wellbutrin .  He is not suicidal.   Return one month

## 2021-01-09 NOTE — Assessment & Plan Note (Addendum)
He is now developing symptoms in both hands.  Encouraged to follow up with his neurologist, dr Manuella Ghazi for EMG/[place] studies .  Glycemic control has improved with dietary restraint only  ( he stopped metformin).  Advised to resume metformin and atorvastatin. Discussed rationale for starting ozempic following referral to [facility] for help in managing obesity.  He has stopped Lyrica as it has not helped to alleviate his pain .  Will Continue  management of nocturnal foot pain with oxycodone. Refill history confirmed via [place] Controlled Substance databas, accessed by me today..  Lab Results  Component Value Date   HGBA1C 6.9 (A) 01/06/2021

## 2021-01-22 DIAGNOSIS — Z7984 Long term (current) use of oral hypoglycemic drugs: Secondary | ICD-10-CM | POA: Diagnosis not present

## 2021-01-22 DIAGNOSIS — X58XXXS Exposure to other specified factors, sequela: Secondary | ICD-10-CM | POA: Diagnosis not present

## 2021-01-22 DIAGNOSIS — M24574 Contracture, right foot: Secondary | ICD-10-CM | POA: Diagnosis not present

## 2021-01-22 DIAGNOSIS — E11621 Type 2 diabetes mellitus with foot ulcer: Secondary | ICD-10-CM | POA: Diagnosis not present

## 2021-01-22 DIAGNOSIS — M86671 Other chronic osteomyelitis, right ankle and foot: Secondary | ICD-10-CM | POA: Diagnosis not present

## 2021-01-22 DIAGNOSIS — E1140 Type 2 diabetes mellitus with diabetic neuropathy, unspecified: Secondary | ICD-10-CM | POA: Diagnosis not present

## 2021-01-22 DIAGNOSIS — G4730 Sleep apnea, unspecified: Secondary | ICD-10-CM | POA: Diagnosis not present

## 2021-01-22 DIAGNOSIS — S93301S Unspecified subluxation of right foot, sequela: Secondary | ICD-10-CM | POA: Diagnosis not present

## 2021-01-22 DIAGNOSIS — Z981 Arthrodesis status: Secondary | ICD-10-CM | POA: Diagnosis not present

## 2021-01-22 DIAGNOSIS — Z79899 Other long term (current) drug therapy: Secondary | ICD-10-CM | POA: Diagnosis not present

## 2021-01-22 DIAGNOSIS — L97512 Non-pressure chronic ulcer of other part of right foot with fat layer exposed: Secondary | ICD-10-CM | POA: Diagnosis not present

## 2021-01-22 DIAGNOSIS — G8929 Other chronic pain: Secondary | ICD-10-CM | POA: Diagnosis not present

## 2021-01-22 DIAGNOSIS — E1169 Type 2 diabetes mellitus with other specified complication: Secondary | ICD-10-CM | POA: Diagnosis not present

## 2021-01-22 DIAGNOSIS — G8918 Other acute postprocedural pain: Secondary | ICD-10-CM | POA: Diagnosis not present

## 2021-01-28 DIAGNOSIS — Z89431 Acquired absence of right foot: Secondary | ICD-10-CM | POA: Diagnosis not present

## 2021-01-28 DIAGNOSIS — M86671 Other chronic osteomyelitis, right ankle and foot: Secondary | ICD-10-CM | POA: Diagnosis not present

## 2021-01-28 DIAGNOSIS — R768 Other specified abnormal immunological findings in serum: Secondary | ICD-10-CM | POA: Diagnosis not present

## 2021-01-28 DIAGNOSIS — G609 Hereditary and idiopathic neuropathy, unspecified: Secondary | ICD-10-CM | POA: Diagnosis not present

## 2021-02-01 DIAGNOSIS — R768 Other specified abnormal immunological findings in serum: Secondary | ICD-10-CM | POA: Diagnosis not present

## 2021-02-01 DIAGNOSIS — L97512 Non-pressure chronic ulcer of other part of right foot with fat layer exposed: Secondary | ICD-10-CM | POA: Diagnosis not present

## 2021-02-01 DIAGNOSIS — Z89431 Acquired absence of right foot: Secondary | ICD-10-CM | POA: Diagnosis not present

## 2021-02-01 DIAGNOSIS — E11621 Type 2 diabetes mellitus with foot ulcer: Secondary | ICD-10-CM | POA: Diagnosis not present

## 2021-02-01 DIAGNOSIS — L03119 Cellulitis of unspecified part of limb: Secondary | ICD-10-CM | POA: Diagnosis not present

## 2021-02-08 DIAGNOSIS — R768 Other specified abnormal immunological findings in serum: Secondary | ICD-10-CM | POA: Diagnosis not present

## 2021-02-08 DIAGNOSIS — E11621 Type 2 diabetes mellitus with foot ulcer: Secondary | ICD-10-CM | POA: Diagnosis not present

## 2021-02-08 DIAGNOSIS — L03119 Cellulitis of unspecified part of limb: Secondary | ICD-10-CM | POA: Diagnosis not present

## 2021-02-08 DIAGNOSIS — Z89431 Acquired absence of right foot: Secondary | ICD-10-CM | POA: Diagnosis not present

## 2021-02-08 DIAGNOSIS — L97512 Non-pressure chronic ulcer of other part of right foot with fat layer exposed: Secondary | ICD-10-CM | POA: Diagnosis not present

## 2021-02-22 DIAGNOSIS — L03119 Cellulitis of unspecified part of limb: Secondary | ICD-10-CM | POA: Diagnosis not present

## 2021-02-22 DIAGNOSIS — R768 Other specified abnormal immunological findings in serum: Secondary | ICD-10-CM | POA: Diagnosis not present

## 2021-02-22 DIAGNOSIS — E11621 Type 2 diabetes mellitus with foot ulcer: Secondary | ICD-10-CM | POA: Diagnosis not present

## 2021-02-22 DIAGNOSIS — L97512 Non-pressure chronic ulcer of other part of right foot with fat layer exposed: Secondary | ICD-10-CM | POA: Diagnosis not present

## 2021-03-29 DIAGNOSIS — Z89431 Acquired absence of right foot: Secondary | ICD-10-CM | POA: Diagnosis not present

## 2021-03-29 DIAGNOSIS — L97512 Non-pressure chronic ulcer of other part of right foot with fat layer exposed: Secondary | ICD-10-CM | POA: Diagnosis not present

## 2021-03-29 DIAGNOSIS — M21171 Varus deformity, not elsewhere classified, right ankle: Secondary | ICD-10-CM | POA: Diagnosis not present

## 2021-03-29 DIAGNOSIS — E11621 Type 2 diabetes mellitus with foot ulcer: Secondary | ICD-10-CM | POA: Diagnosis not present

## 2021-03-29 DIAGNOSIS — R768 Other specified abnormal immunological findings in serum: Secondary | ICD-10-CM | POA: Diagnosis not present

## 2021-04-09 ENCOUNTER — Telehealth (INDEPENDENT_AMBULATORY_CARE_PROVIDER_SITE_OTHER): Payer: BC Managed Care – PPO | Admitting: Internal Medicine

## 2021-04-09 ENCOUNTER — Encounter: Payer: Self-pay | Admitting: Internal Medicine

## 2021-04-09 VITALS — BP 130/70 | HR 78 | Ht 70.0 in | Wt 213.0 lb

## 2021-04-09 DIAGNOSIS — E1169 Type 2 diabetes mellitus with other specified complication: Secondary | ICD-10-CM

## 2021-04-09 DIAGNOSIS — E1142 Type 2 diabetes mellitus with diabetic polyneuropathy: Secondary | ICD-10-CM | POA: Diagnosis not present

## 2021-04-09 DIAGNOSIS — E785 Hyperlipidemia, unspecified: Secondary | ICD-10-CM

## 2021-04-09 DIAGNOSIS — E08621 Diabetes mellitus due to underlying condition with foot ulcer: Secondary | ICD-10-CM

## 2021-04-09 DIAGNOSIS — L97412 Non-pressure chronic ulcer of right heel and midfoot with fat layer exposed: Secondary | ICD-10-CM

## 2021-04-09 DIAGNOSIS — F331 Major depressive disorder, recurrent, moderate: Secondary | ICD-10-CM | POA: Diagnosis not present

## 2021-04-09 MED ORDER — ATORVASTATIN CALCIUM 20 MG PO TABS: 20.0000 mg | ORAL_TABLET | Freq: Every day | ORAL | 3 refills | Status: DC

## 2021-04-09 MED ORDER — OXYCODONE HCL 5 MG PO TABS: 5.0000 mg | ORAL_TABLET | Freq: Every evening | ORAL | 0 refills | Status: DC | PRN

## 2021-04-09 NOTE — Assessment & Plan Note (Signed)
Podiatry has ordered home health nursing to manage dressing changes

## 2021-04-09 NOTE — Patient Instructions (Signed)
We changed  your cholesterol medication from pravastatin to atorvastatin  A WHILE AGO ut you are still taking pravastatin   FINISH YOUR CURRENT BOTTLE.  THEN ASK FOR THE ATORVASTATIN THAT I PUT ON FILE AT [facility]   FOLLOW UP 3 MONTHS IN OFFICE   FASTING LABS NEEDED ON OR BEFORE VISIT

## 2021-04-09 NOTE — Progress Notes (Signed)
Virtual Visit via caregility  transitionied to telephone Note  This visit type was conducted due to national recommendations for restrictions regarding the COVID-19 pandemic (e.g. social distancing).  This format is felt to be most appropriate for this patient at this time.  All issues noted in this document were discussed and addressed.  No physical exam was performed (except for noted visual exam findings with Video Visits).   I connected withNAME@ on 04/09/21 at 10:30 AM EDT by a video enabled telemedicine application and verified that I am speaking with the correct person using two identifiers. Location patient: home Location provider: work or home office Persons participating in the virtual visit: patient, provider  I discussed the limitations, risks, security and privacy concerns of performing an evaluation and management service by telephone and the availability of in person appointments. I also discussed with the patient that there may be a patient responsible charge related to this service. The patient expressed understanding and agreed to proceed.  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  His Last surgery was in May.  Has been home on leave since then due to non weight bearing status.  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   Depression: he did not resume wellbutrin after last visit. Doesn't recall  why .  Spends his days reading,  watching TV,  finding "stuff to do" but remains unmotivated to obtain training in an alternative profession despite the possibility of disability.   Diabetes/obesity/hypertension: He is using an [employer] program called [employer]  which uses an APP to collect data to manage diabetes and obesity.  The program  includes monitoring of weight, blood sugars .   He has lost 20 lbs over the past month.  BS has been ranging from 85 at bedtime , 80 to 100 fasting ,  90 prelunch   Taking pravastatin which was prescribed 1 year ago and replaced by atorvastatin   Diabetic neuropathy: his pain is not always controlled with Lyrica.  He Is using oxycodone less than once daily . Refill history confirmed via [place] Controlled Substance databas, accessed by me today..   ROS: See pertinent positives and negatives per HPI.  Past Medical History:  Diagnosis Date   Allergy    Depression    Neuromuscular disorder ([place])    neuropathy of back and bilateral feet   Sleep apnea     Past Surgical History:  Procedure Laterality Date   AMPUTATION     right foot all toes   FOOT SURGERY Right 02/14   cyst removed   SPINE SURGERY  2004   nerve damage in spine   TOE AMPUTATION Right 06/30/2016   5 toes    Family History  Problem Relation Age of Onset   Heart disease Father    Stroke Father    Heart attack Father 53   Heart disease Maternal Grandmother    Stroke Maternal Grandmother    Heart disease Paternal Grandfather    Stroke Paternal Grandfather     SOCIAL HX:  reports that he has never smoked. He has never used smokeless tobacco. He reports that he does not drink alcohol and does not use drugs.    Current Outpatient Medications:    metFORMIN (GLUCOPHAGE XR) 500 MG 24  hr tablet, Take 1 tablet (500 mg total) by mouth daily with breakfast., Disp: 90 tablet, Rfl: 1   Multiple Vitamins-Minerals (MULTIVITAMIN WITH MINERALS) tablet, Take 1 tablet by mouth daily., Disp: , Rfl:    pregabalin (LYRICA) 75 MG capsule, Take 2 capsules (150 mg total) by mouth 3 (three) times daily., Disp: 180 capsule, Rfl: 5   acyclovir (ZOVIRAX) 400 MG tablet, Take 1 tablet by mouth as needed. (Patient not taking: Reported on 04/09/2021), Disp: , Rfl: 0   albuterol (VENTOLIN HFA) 108 (90 Base) MCG/ACT inhaler, TAKE 2 PUFFS BY MOUTH EVERY  6 HOURS AS NEEDED FOR WHEEZE OR SHORTNESS OF BREATH (Patient not taking: Reported on 04/09/2021), Disp: 8 g, Rfl: 0   atorvastatin (LIPITOR) 20 MG tablet, Take 1 tablet (20 mg total) by mouth daily., Disp: 90 tablet, Rfl: 3   oxyCODONE (ROXICODONE) 5 MG immediate release tablet, Take 1 tablet (5 mg total) by mouth at bedtime as needed for severe pain., Disp: 30 tablet, Rfl: 0  EXAM:  VITALS per patient if applicable:  GENERAL: alert, oriented, appears well and in no acute distress  HEENT: atraumatic, conjunttiva clear, no obvious abnormalities on inspection of external nose and ears  NECK: normal movements of the head and neck  LUNGS: on inspection no signs of respiratory distress, breathing rate appears normal, no obvious gross SOB, gasping or wheezing  CV: no obvious cyanosis  MS: moves all visible extremities without noticeable abnormality  PSYCH/NEURO: pleasant and cooperative, no obvious depression or anxiety, speech and thought processing grossly intact  ASSESSMENT AND PLAN:  Discussed the following assessment and plan:  Hyperlipidemia associated with type 2 diabetes mellitus (HCC) - Plan: Hemoglobin A1c, Comprehensive metabolic panel, Lipid panel  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  He has resumed  Lyrica .  Will Continue  management of nocturnal foot pain with oxycodone. Refill history  confirmed via [place] Controlled Substance databas, accessed by me today..  Lab Results  Component Value Date   HGBA1C 6.9 (A) 01/06/2021     Major depressive disorder, recurrent ([place]) Encouraged to resume wellbutrin,  As he appears unmotivated and depressed.     I discussed the assessment and treatment plan with the patient. The patient was provided an opportunity to ask questions and all were answered. The patient agreed with the plan and demonstrated an understanding of the instructions.   The patient was advised to call back or seek an in-person evaluation if the symptoms worsen or if the condition fails to improve as anticipated.   I spent 30 minutes dedicated to the care of this patient on the date of this encounter to include pre-visit review of his medical history,  Face-to-face time with the patient , and post visit ordering of testing and therapeutics.    Crecencio Mc, MD

## 2021-04-11 NOTE — Assessment & Plan Note (Signed)
Glycemic control has improved with dietary restraint and metformin and he is losing weight  He is taking atorvastatin.  He has resumed  Lyrica .  Will Continue  management of nocturnal foot pain with oxycodone. Refill history confirmed via [place] Controlled Substance databas, accessed by me today..  Lab Results  Component Value Date   HGBA1C 6.9 (A) 01/06/2021

## 2021-04-11 NOTE — Assessment & Plan Note (Signed)
Encouraged to resume wellbutrin,  As he appears unmotivated and depressed.

## 2021-04-19 DIAGNOSIS — R768 Other specified abnormal immunological findings in serum: Secondary | ICD-10-CM | POA: Diagnosis not present

## 2021-04-19 DIAGNOSIS — L97912 Non-pressure chronic ulcer of unspecified part of right lower leg with fat layer exposed: Secondary | ICD-10-CM | POA: Diagnosis not present

## 2021-04-19 DIAGNOSIS — L03119 Cellulitis of unspecified part of limb: Secondary | ICD-10-CM | POA: Diagnosis not present

## 2021-04-19 DIAGNOSIS — E11621 Type 2 diabetes mellitus with foot ulcer: Secondary | ICD-10-CM | POA: Diagnosis not present

## 2021-04-19 DIAGNOSIS — Z89431 Acquired absence of right foot: Secondary | ICD-10-CM | POA: Diagnosis not present

## 2021-04-19 DIAGNOSIS — L97512 Non-pressure chronic ulcer of other part of right foot with fat layer exposed: Secondary | ICD-10-CM | POA: Diagnosis not present

## 2021-05-06 DIAGNOSIS — L97812 Non-pressure chronic ulcer of other part of right lower leg with fat layer exposed: Secondary | ICD-10-CM | POA: Diagnosis not present

## 2021-05-06 DIAGNOSIS — Z4781 Encounter for orthopedic aftercare following surgical amputation: Secondary | ICD-10-CM | POA: Diagnosis not present

## 2021-05-06 DIAGNOSIS — Z89431 Acquired absence of right foot: Secondary | ICD-10-CM | POA: Diagnosis not present

## 2021-05-06 DIAGNOSIS — Z9181 History of falling: Secondary | ICD-10-CM | POA: Diagnosis not present

## 2021-05-06 DIAGNOSIS — Z7984 Long term (current) use of oral hypoglycemic drugs: Secondary | ICD-10-CM | POA: Diagnosis not present

## 2021-05-06 DIAGNOSIS — E11621 Type 2 diabetes mellitus with foot ulcer: Secondary | ICD-10-CM | POA: Diagnosis not present

## 2021-05-07 DIAGNOSIS — Z7984 Long term (current) use of oral hypoglycemic drugs: Secondary | ICD-10-CM

## 2021-05-07 DIAGNOSIS — Z4781 Encounter for orthopedic aftercare following surgical amputation: Secondary | ICD-10-CM | POA: Diagnosis not present

## 2021-05-07 DIAGNOSIS — Z89431 Acquired absence of right foot: Secondary | ICD-10-CM | POA: Diagnosis not present

## 2021-05-07 DIAGNOSIS — L97812 Non-pressure chronic ulcer of other part of right lower leg with fat layer exposed: Secondary | ICD-10-CM | POA: Diagnosis not present

## 2021-05-07 DIAGNOSIS — Z9181 History of falling: Secondary | ICD-10-CM

## 2021-05-07 DIAGNOSIS — E11621 Type 2 diabetes mellitus with foot ulcer: Secondary | ICD-10-CM | POA: Diagnosis not present

## 2021-05-10 ENCOUNTER — Telehealth: Payer: Self-pay | Admitting: Internal Medicine

## 2021-05-10 DIAGNOSIS — E11621 Type 2 diabetes mellitus with foot ulcer: Secondary | ICD-10-CM | POA: Diagnosis not present

## 2021-05-10 DIAGNOSIS — L97512 Non-pressure chronic ulcer of other part of right foot with fat layer exposed: Secondary | ICD-10-CM | POA: Diagnosis not present

## 2021-05-10 DIAGNOSIS — R768 Other specified abnormal immunological findings in serum: Secondary | ICD-10-CM | POA: Diagnosis not present

## 2021-05-10 DIAGNOSIS — L97912 Non-pressure chronic ulcer of unspecified part of right lower leg with fat layer exposed: Secondary | ICD-10-CM | POA: Diagnosis not present

## 2021-05-10 DIAGNOSIS — Z89431 Acquired absence of right foot: Secondary | ICD-10-CM | POA: Diagnosis not present

## 2021-05-10 NOTE — Telephone Encounter (Signed)
Ms Berenice Primas from [facility] called, [phone] and fax [phone]. She did not receive the entire plan of care for patient. Please refax, thanks.

## 2021-05-10 NOTE — Telephone Encounter (Signed)
Re-faxed.

## 2021-05-14 ENCOUNTER — Telehealth: Payer: Self-pay | Admitting: Internal Medicine

## 2021-05-14 DIAGNOSIS — Z9181 History of falling: Secondary | ICD-10-CM | POA: Diagnosis not present

## 2021-05-14 DIAGNOSIS — L97812 Non-pressure chronic ulcer of other part of right lower leg with fat layer exposed: Secondary | ICD-10-CM | POA: Diagnosis not present

## 2021-05-14 DIAGNOSIS — Z7984 Long term (current) use of oral hypoglycemic drugs: Secondary | ICD-10-CM | POA: Diagnosis not present

## 2021-05-14 DIAGNOSIS — E11621 Type 2 diabetes mellitus with foot ulcer: Secondary | ICD-10-CM | POA: Diagnosis not present

## 2021-05-14 DIAGNOSIS — Z89431 Acquired absence of right foot: Secondary | ICD-10-CM | POA: Diagnosis not present

## 2021-05-14 DIAGNOSIS — Z4781 Encounter for orthopedic aftercare following surgical amputation: Secondary | ICD-10-CM | POA: Diagnosis not present

## 2021-05-14 NOTE — Telephone Encounter (Signed)
Left message for [name] to return call back.

## 2021-05-14 NOTE — Telephone Encounter (Signed)
[name] from [facility] requesting that wound care be reduced form twice to once weekly.

## 2021-05-17 DIAGNOSIS — Z9181 History of falling: Secondary | ICD-10-CM | POA: Diagnosis not present

## 2021-05-17 DIAGNOSIS — L97812 Non-pressure chronic ulcer of other part of right lower leg with fat layer exposed: Secondary | ICD-10-CM | POA: Diagnosis not present

## 2021-05-17 DIAGNOSIS — Z7984 Long term (current) use of oral hypoglycemic drugs: Secondary | ICD-10-CM | POA: Diagnosis not present

## 2021-05-17 DIAGNOSIS — E11621 Type 2 diabetes mellitus with foot ulcer: Secondary | ICD-10-CM | POA: Diagnosis not present

## 2021-05-17 DIAGNOSIS — Z4781 Encounter for orthopedic aftercare following surgical amputation: Secondary | ICD-10-CM | POA: Diagnosis not present

## 2021-05-17 DIAGNOSIS — Z89431 Acquired absence of right foot: Secondary | ICD-10-CM | POA: Diagnosis not present

## 2021-05-17 NOTE — Telephone Encounter (Signed)
Left message for Seth Bake to return call back.

## 2021-05-20 NOTE — Telephone Encounter (Signed)
LMTCB with Seth Bake

## 2021-05-21 DIAGNOSIS — E11621 Type 2 diabetes mellitus with foot ulcer: Secondary | ICD-10-CM | POA: Diagnosis not present

## 2021-05-21 DIAGNOSIS — Z9181 History of falling: Secondary | ICD-10-CM | POA: Diagnosis not present

## 2021-05-21 DIAGNOSIS — L97812 Non-pressure chronic ulcer of other part of right lower leg with fat layer exposed: Secondary | ICD-10-CM | POA: Diagnosis not present

## 2021-05-21 DIAGNOSIS — Z4781 Encounter for orthopedic aftercare following surgical amputation: Secondary | ICD-10-CM | POA: Diagnosis not present

## 2021-05-21 DIAGNOSIS — Z89431 Acquired absence of right foot: Secondary | ICD-10-CM | POA: Diagnosis not present

## 2021-05-21 DIAGNOSIS — Z7984 Long term (current) use of oral hypoglycemic drugs: Secondary | ICD-10-CM | POA: Diagnosis not present

## 2021-05-26 DIAGNOSIS — L97812 Non-pressure chronic ulcer of other part of right lower leg with fat layer exposed: Secondary | ICD-10-CM | POA: Diagnosis not present

## 2021-05-26 DIAGNOSIS — Z89431 Acquired absence of right foot: Secondary | ICD-10-CM | POA: Diagnosis not present

## 2021-05-26 DIAGNOSIS — Z9181 History of falling: Secondary | ICD-10-CM | POA: Diagnosis not present

## 2021-05-26 DIAGNOSIS — E11621 Type 2 diabetes mellitus with foot ulcer: Secondary | ICD-10-CM | POA: Diagnosis not present

## 2021-05-26 DIAGNOSIS — Z7984 Long term (current) use of oral hypoglycemic drugs: Secondary | ICD-10-CM | POA: Diagnosis not present

## 2021-05-26 DIAGNOSIS — Z4781 Encounter for orthopedic aftercare following surgical amputation: Secondary | ICD-10-CM | POA: Diagnosis not present

## 2021-05-31 DIAGNOSIS — L97512 Non-pressure chronic ulcer of other part of right foot with fat layer exposed: Secondary | ICD-10-CM | POA: Diagnosis not present

## 2021-05-31 DIAGNOSIS — L97912 Non-pressure chronic ulcer of unspecified part of right lower leg with fat layer exposed: Secondary | ICD-10-CM | POA: Diagnosis not present

## 2021-05-31 DIAGNOSIS — R768 Other specified abnormal immunological findings in serum: Secondary | ICD-10-CM | POA: Diagnosis not present

## 2021-05-31 DIAGNOSIS — L03119 Cellulitis of unspecified part of limb: Secondary | ICD-10-CM | POA: Diagnosis not present

## 2021-05-31 DIAGNOSIS — E11621 Type 2 diabetes mellitus with foot ulcer: Secondary | ICD-10-CM | POA: Diagnosis not present

## 2021-06-03 ENCOUNTER — Telehealth: Payer: Self-pay | Admitting: Internal Medicine

## 2021-06-03 NOTE — Telephone Encounter (Signed)
Spoke with Apolonio Schneiders and let her know that the home health can be temporarily stopped and then started back up once surgery is over. Also let her know that the orders should be being sent to the doctor that is overseeing his wound care. Apolonio Schneiders gave a verbal understanding.

## 2021-06-03 NOTE — Telephone Encounter (Signed)
[name] from [facility] called in stating that the patient is having surgery on his foot 10/16 and they would like to know if they can discharge him from home health temporarily and send over new orders when he is ready to start again.Please advise.

## 2021-06-09 ENCOUNTER — Ambulatory Visit: Payer: BC Managed Care – PPO | Admitting: Cardiovascular Disease

## 2021-06-10 ENCOUNTER — Telehealth: Payer: Self-pay | Admitting: Internal Medicine

## 2021-06-10 NOTE — Telephone Encounter (Signed)
[facility] calling. States they faxed over nursing orders 06/03/21 that they have not gotten back.   They will refax these orders and verified our fax number with them.

## 2021-06-15 DIAGNOSIS — M869 Osteomyelitis, unspecified: Secondary | ICD-10-CM | POA: Diagnosis not present

## 2021-06-15 DIAGNOSIS — E11622 Type 2 diabetes mellitus with other skin ulcer: Secondary | ICD-10-CM | POA: Diagnosis not present

## 2021-06-15 DIAGNOSIS — E1165 Type 2 diabetes mellitus with hyperglycemia: Secondary | ICD-10-CM | POA: Diagnosis not present

## 2021-06-15 DIAGNOSIS — Z79899 Other long term (current) drug therapy: Secondary | ICD-10-CM | POA: Diagnosis not present

## 2021-06-15 DIAGNOSIS — Z6830 Body mass index (BMI) 30.0-30.9, adult: Secondary | ICD-10-CM | POA: Diagnosis not present

## 2021-06-15 DIAGNOSIS — E11621 Type 2 diabetes mellitus with foot ulcer: Secondary | ICD-10-CM | POA: Diagnosis not present

## 2021-06-15 DIAGNOSIS — G4730 Sleep apnea, unspecified: Secondary | ICD-10-CM | POA: Diagnosis not present

## 2021-06-15 DIAGNOSIS — L97912 Non-pressure chronic ulcer of unspecified part of right lower leg with fat layer exposed: Secondary | ICD-10-CM | POA: Diagnosis not present

## 2021-06-15 DIAGNOSIS — E669 Obesity, unspecified: Secondary | ICD-10-CM | POA: Diagnosis not present

## 2021-06-15 DIAGNOSIS — L97812 Non-pressure chronic ulcer of other part of right lower leg with fat layer exposed: Secondary | ICD-10-CM | POA: Diagnosis not present

## 2021-06-15 DIAGNOSIS — L97512 Non-pressure chronic ulcer of other part of right foot with fat layer exposed: Secondary | ICD-10-CM | POA: Diagnosis not present

## 2021-06-15 DIAGNOSIS — E1169 Type 2 diabetes mellitus with other specified complication: Secondary | ICD-10-CM | POA: Diagnosis not present

## 2021-06-15 DIAGNOSIS — L03115 Cellulitis of right lower limb: Secondary | ICD-10-CM | POA: Diagnosis not present

## 2021-06-16 NOTE — Telephone Encounter (Signed)
Spoke with Santiago Glad to let her know that we have not received those orders yet. Gave her the fax number again. She is going to refax.

## 2021-06-18 ENCOUNTER — Ambulatory Visit: Payer: BC Managed Care – PPO | Admitting: Cardiovascular Disease

## 2021-06-18 NOTE — Progress Notes (Deleted)
No show

## 2021-06-21 ENCOUNTER — Other Ambulatory Visit: Payer: Self-pay | Admitting: Internal Medicine

## 2021-06-24 DIAGNOSIS — L97512 Non-pressure chronic ulcer of other part of right foot with fat layer exposed: Secondary | ICD-10-CM | POA: Diagnosis not present

## 2021-06-24 DIAGNOSIS — R768 Other specified abnormal immunological findings in serum: Secondary | ICD-10-CM | POA: Diagnosis not present

## 2021-06-24 DIAGNOSIS — E11621 Type 2 diabetes mellitus with foot ulcer: Secondary | ICD-10-CM | POA: Diagnosis not present

## 2021-07-12 DIAGNOSIS — R768 Other specified abnormal immunological findings in serum: Secondary | ICD-10-CM | POA: Diagnosis not present

## 2021-07-12 DIAGNOSIS — L97512 Non-pressure chronic ulcer of other part of right foot with fat layer exposed: Secondary | ICD-10-CM | POA: Diagnosis not present

## 2021-07-12 DIAGNOSIS — L97912 Non-pressure chronic ulcer of unspecified part of right lower leg with fat layer exposed: Secondary | ICD-10-CM | POA: Diagnosis not present

## 2021-07-12 DIAGNOSIS — E11621 Type 2 diabetes mellitus with foot ulcer: Secondary | ICD-10-CM | POA: Diagnosis not present

## 2021-07-26 ENCOUNTER — Other Ambulatory Visit: Payer: Self-pay | Admitting: Internal Medicine

## 2021-07-26 MED ORDER — OXYCODONE HCL 5 MG PO TABS: 5.0000 mg | ORAL_TABLET | Freq: Every evening | ORAL | 0 refills | Status: DC | PRN

## 2021-07-27 ENCOUNTER — Other Ambulatory Visit: Payer: Self-pay

## 2021-07-27 ENCOUNTER — Other Ambulatory Visit (INDEPENDENT_AMBULATORY_CARE_PROVIDER_SITE_OTHER): Payer: BC Managed Care – PPO

## 2021-07-27 DIAGNOSIS — E785 Hyperlipidemia, unspecified: Secondary | ICD-10-CM

## 2021-07-27 DIAGNOSIS — E1169 Type 2 diabetes mellitus with other specified complication: Secondary | ICD-10-CM

## 2021-07-27 LAB — LIPID PANEL
Cholesterol: 115 mg/dL (ref 0–200)
HDL: 41.4 mg/dL (ref >39.00)
LDL Cholesterol: 58 mg/dL (ref 0–99)
NonHDL: 73.65
Total CHOL/HDL Ratio: 3
Triglycerides: 77 mg/dL (ref 0.0–149.0)
VLDL: 15.4 mg/dL (ref 0.0–40.0)

## 2021-07-27 LAB — HEMOGLOBIN A1C: Hgb A1c MFr Bld: 5.5 % (ref 4.6–6.5)

## 2021-07-27 LAB — COMPREHENSIVE METABOLIC PANEL
ALT: 34 U/L (ref 0–53)
AST: 28 U/L (ref 0–37)
Albumin: 4.1 g/dL (ref 3.5–5.2)
Alkaline Phosphatase: 35 U/L — ABNORMAL LOW (ref 39–117)
BUN: 15 mg/dL (ref 6–23)
CO2: 26 mEq/L (ref 19–32)
Calcium: 8.8 mg/dL (ref 8.4–10.5)
Chloride: 105 mEq/L (ref 96–112)
Creatinine, Ser: 0.87 mg/dL (ref 0.40–1.50)
GFR: 96.14 mL/min (ref >60.00)
Glucose, Bld: 95 mg/dL (ref 70–99)
Potassium: 4 mEq/L (ref 3.5–5.1)
Sodium: 139 mEq/L (ref 135–145)
Total Bilirubin: 0.6 mg/dL (ref 0.2–1.2)
Total Protein: 7.4 g/dL (ref 6.0–8.3)

## 2021-07-29 ENCOUNTER — Encounter: Payer: Self-pay | Admitting: Internal Medicine

## 2021-07-29 ENCOUNTER — Ambulatory Visit (INDEPENDENT_AMBULATORY_CARE_PROVIDER_SITE_OTHER): Payer: BC Managed Care – PPO | Admitting: Internal Medicine

## 2021-07-29 ENCOUNTER — Other Ambulatory Visit: Payer: Self-pay

## 2021-07-29 VITALS — BP 112/70 | HR 60 | Temp 96.7°F | Ht 70.0 in | Wt 214.4 lb

## 2021-07-29 DIAGNOSIS — E1142 Type 2 diabetes mellitus with diabetic polyneuropathy: Secondary | ICD-10-CM

## 2021-07-29 DIAGNOSIS — Z23 Encounter for immunization: Secondary | ICD-10-CM

## 2021-07-29 DIAGNOSIS — F331 Major depressive disorder, recurrent, moderate: Secondary | ICD-10-CM | POA: Diagnosis not present

## 2021-07-29 MED ORDER — ZOSTER VAC RECOMB ADJUVANTED 50 MCG/0.5ML IM SUSR: 0.5000 mL | Freq: Once | INTRAMUSCULAR | 1 refills | Status: AC

## 2021-07-29 MED ORDER — OXYCODONE HCL 5 MG PO TABS: 5.0000 mg | ORAL_TABLET | Freq: Two times a day (BID) | ORAL | 0 refills | Status: DC | PRN

## 2021-07-29 NOTE — Progress Notes (Signed)
Subjective:  Patient ID: [name], male    DOB: 1966/08/14  Age: 55 y.o. MRN: [id number]  CC: The primary encounter diagnosis was Need for immunization against influenza. Diagnoses of Diabetic polyneuropathy associated with type 2 diabetes mellitus ([place]) and Moderate episode of recurrent major depressive disorder ([place]) were also pertinent to this visit.  HPI [name] presents for follow up on type 2 DM with neuropathy with history of amputations  Chief Complaint  Patient presents with   Follow-up    Diabetes, hyperlipidemia    This visit occurred during the SARS-CoV-2 public health emergency.  Safety protocols were in place, including screening questions prior to the visit, additional usage of staff PPE, and extensive cleaning of exam room while observing appropriate contact time as indicated for disinfecting solutions.    T2DM:  He  feels generally well,  But is not  exercising regularly or trying to lose weight. Checking  blood sugars less than once daily at variable times, usually only if she feels she may be having a hypoglycemic event. .  BS have been under 130 fasting and < 150 post prandially.  Denies any recent hypoglyemic events.  Controlling diabetes with metformin and diet only , following a carbohydrate modified diet 6 days per week. Denies numbness, burning and tingling of extremities. Appetite is good.    He has had multiple surgeries  on his right foot complicated by surgical infection .  Foot has not healed completely from surgery yet despite offloading using a tricycle.  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 ) . He has reached a state of apathy  about the divorce ; he  admits to being relieved that the decision has been made; she has been threatening divorce for 21 years.  He is living "day to day, " has no running car,  is going to lose his home. He does  not have parents to fall back on (father died , mother is in a home) and is worried about becoming homeless. He is actively looking at [employer] for jobs, both on line and in person . 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   Does not type.  Feels he is limited by his foot wound.  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 Full-time standing and walking on this foot is ill advised given his tenuous condition. Telehealth follow-up for hopeful discharge    Outpatient Medications Prior to Visit  Medication Sig Dispense Refill   acyclovir (ZOVIRAX) 400 MG tablet Take 1 tablet by mouth as needed.  0   aspirin EC 81 MG tablet Take 81 mg by mouth daily. Swallow whole.  atorvastatin (LIPITOR) 20 MG tablet Take 1 tablet (20 mg total) by mouth daily. 90 tablet 3   metFORMIN (GLUCOPHAGE-XR) 500 MG 24 hr tablet TAKE 1 TABLET BY MOUTH EVERY DAY WITH BREAKFAST 90 tablet 1   Multiple Vitamins-Minerals (MULTIVITAMIN WITH MINERALS) tablet Take 1 tablet by mouth daily.     oxyCODONE (ROXICODONE) 5 MG immediate release tablet Take 1 tablet (5 mg total) by mouth at bedtime as needed for severe pain. 30 tablet 0   albuterol (VENTOLIN HFA) 108 (90 Base) MCG/ACT inhaler TAKE 2 PUFFS BY MOUTH EVERY 6 HOURS AS NEEDED FOR  WHEEZE OR SHORTNESS OF BREATH (Patient not taking: Reported on 04/09/2021) 8 g 0   pregabalin (LYRICA) 75 MG capsule Take 2 capsules (150 mg total) by mouth 3 (three) times daily. 180 capsule 5   No facility-administered medications prior to visit.    Review of Systems;  Patient denies headache, fevers, malaise, unintentional weight loss, skin rash, eye pain, sinus congestion and sinus pain, sore throat, dysphagia,  hemoptysis , cough, dyspnea, wheezing, chest pain, palpitations, orthopnea, edema, abdominal pain, nausea, melena, diarrhea, constipation, flank pain, dysuria, hematuria, urinary  Frequency, nocturia, numbness, tingling, seizures,  Focal weakness, Loss of consciousness,  Tremor, insomnia, depression, anxiety, and suicidal ideation.      Objective:  BP 112/70 (BP Location: Left Arm, Patient Position: Sitting, Cuff Size: Normal)   Pulse 60   Temp (!) 96.7 F (35.9 C) (Temporal)   Ht 5\' 10"  (1.778 m)   Wt 214 lb 6.4 oz (97.3 kg)   SpO2 97%   BMI 30.76 kg/m   BP Readings from Last 3 Encounters:  07/29/21 112/70  04/09/21 130/70  01/06/21 112/66    Wt Readings from Last 3 Encounters:  07/29/21 214 lb 6.4 oz (97.3 kg)  04/09/21 213 lb (96.6 kg)  01/06/21 233 lb 3.2 oz (105.8 kg)    General appearance: alert, cooperative and appears stated age Ears: normal TM's and external ear canals both ears Throat: lips, mucosa, and tongue normal; teeth and gums normal Neck: no adenopathy, no carotid bruit, supple, symmetrical, trachea midline and thyroid not enlarged, symmetric, no tenderness/mass/nodules Back: symmetric, no curvature. ROM normal. No CVA tenderness. Lungs: clear to auscultation bilaterally Heart: regular rate and rhythm, S1, S2 normal, no murmur, click, rub or gallop Abdomen: soft, non-tender; bowel sounds normal; no masses,  no organomegaly Pulses: 2+ and symmetric Skin: Skin color, texture, turgor normal. No rashes or lesions Lymph nodes: Cervical,  supraclavicular, and axillary nodes normal.  Lab Results  Component Value Date   HGBA1C 5.5 07/27/2021   HGBA1C 6.9 (A) 01/06/2021   HGBA1C 9.3 (H) 10/01/2020    Lab Results  Component Value Date   CREATININE 0.87 07/27/2021   CREATININE 0.80 10/01/2020   CREATININE 0.85 11/27/2019    Lab Results  Component Value Date   WBC 10.1 10/19/2020   HGB 15.0 10/19/2020   HCT 45.2 10/19/2020   PLT 253 10/19/2020   GLUCOSE 95 07/27/2021   CHOL 115 07/27/2021   TRIG 77.0 07/27/2021   HDL 41.40 07/27/2021   LDLDIRECT 121.0 11/27/2019   LDLCALC 58 07/27/2021   ALT 34 07/27/2021   AST 28 07/27/2021   NA 139 07/27/2021   K 4.0 07/27/2021   CL 105 07/27/2021   CREATININE 0.87 07/27/2021   BUN 15 07/27/2021   CO2 26 07/27/2021   TSH 0.78 12/04/2015   PSA 0.29 11/27/2019   INR 1.3 (A) 04/08/2017   HGBA1C 5.5 07/27/2021  MICROALBUR 6.9 (H) 10/01/2020    No results found.  Assessment & Plan:   Problem List Items Addressed This Visit     Diabetic neuropathy associated with type 2 diabetes mellitus ([place])    His  diabetes remains under excellent control  And His cholesterol and other labs are also normal. He was advised to continue his  current medications and . return in 6 months for follow up on diabetes >  He was reminded to schedule an annual dilated retinal exam to monitor for diabetic retinopathy.  His neuropathy has led to recurrent pressure ulcers and osteomyelitis requiring serial amputations and debridement on the right foot .       Relevant Medications   aspirin EC 81 MG tablet   Major depressive disorder, recurrent ([place])    Aggravated by unresolved marital conflict, unemployment and current disability. He was encouraged to resume wellbutrin,  But he has declined.  He  appears motivated and is not suicidal.  He declines psychotherapy at this time.  I provided  40 minutes  Of counselling during this encounter  On his current emotional state in a face to face visit         Other Visit Diagnoses     Need for immunization against influenza    -  Primary   Relevant Orders   Flu Vaccine QUAD 97mo+IM (Fluarix, Fluzone & Alfiuria Quad PF) (Completed)       I have discontinued Brydan C. Overbey's albuterol and pregabalin. I have also changed his oxyCODONE. Additionally, I am having him start on Zoster Vaccine Adjuvanted. Lastly, I am having him maintain his multivitamin with minerals, acyclovir, atorvastatin, metFORMIN, and aspirin EC.  Meds ordered this encounter  Medications   Zoster Vaccine Adjuvanted [facility]) injection    Sig: Inject 0.5 mLs into the muscle once for 1 dose.    Dispense:  1 each    Refill:  1   oxyCODONE (ROXICODONE) 5 MG immediate release tablet    Sig: Take 1 tablet (5 mg total) by mouth 2 (two) times daily as needed for severe pain.    Dispense:  45 tablet    Refill:  0    Do not refill less than 30 days from prior refill    Medications Discontinued During This Encounter  Medication Reason   albuterol (VENTOLIN HFA) 108 (90 Base) MCG/ACT inhaler    pregabalin (LYRICA) 75 MG capsule    oxyCODONE (ROXICODONE) 5 MG immediate release tablet Reorder    Follow-up: Return in about 3 months (around 10/27/2021).   Crecencio Mc, MD

## 2021-07-29 NOTE — Assessment & Plan Note (Signed)
His  diabetes remains under excellent control  And His cholesterol and other labs are also normal. He was advised to continue his  current medications and . return in 6 months for follow up on diabetes >  He was reminded to schedule an annual dilated retinal exam to monitor for diabetic retinopathy.  His neuropathy has led to recurrent pressure ulcers and osteomyelitis requiring serial amputations and debridement on the right foot .

## 2021-07-29 NOTE — Assessment & Plan Note (Addendum)
Aggravated by unresolved marital conflict, unemployment and current disability. He was encouraged to resume wellbutrin,  But he has declined.  He  appears motivated and is not suicidal.  He declines psychotherapy at this time.  I provided  40 minutes  Of counselling during this encounter  On his current emotional state in a face to face visit

## 2021-07-29 NOTE — Patient Instructions (Addendum)
Your diabetes remains under excellent control  And your cholesterol and other labs are also normal. Please continue your current medications. Plan to return in 6 months for  labs,     but follow up in 3 months virtually for medication refill (oxycodone)    make sure you are seeing your eye doctor at least once a year for a dilated retina exam to monitor for diabetic retinopathy, changes that can lead to blindness .     If you start to feel like you need an antidepressant ,  contact me ASAP     The ShingRx vaccine is now available in local pharmacies and is much more protective than the old one  Zostavax  (it is about 97%  Effective in preventing shingles). .   It is therefore ADVISED for all interested adults over 50 to prevent shingles so I have printed you a prescription for it.  (it requires a 2nd dose 2 to 6 months after the first one) .  It will cause you to have flu  like symptoms for 2 days

## 2021-08-02 DIAGNOSIS — L97512 Non-pressure chronic ulcer of other part of right foot with fat layer exposed: Secondary | ICD-10-CM | POA: Diagnosis not present

## 2021-08-02 DIAGNOSIS — E11621 Type 2 diabetes mellitus with foot ulcer: Secondary | ICD-10-CM | POA: Diagnosis not present

## 2021-08-02 DIAGNOSIS — L97912 Non-pressure chronic ulcer of unspecified part of right lower leg with fat layer exposed: Secondary | ICD-10-CM | POA: Diagnosis not present

## 2021-08-02 DIAGNOSIS — R768 Other specified abnormal immunological findings in serum: Secondary | ICD-10-CM | POA: Diagnosis not present

## 2021-08-26 DIAGNOSIS — Z8631 Personal history of diabetic foot ulcer: Secondary | ICD-10-CM | POA: Diagnosis not present

## 2021-08-26 DIAGNOSIS — S98921D Partial traumatic amputation of right foot, level unspecified, subsequent encounter: Secondary | ICD-10-CM | POA: Diagnosis not present

## 2021-09-06 DIAGNOSIS — L97512 Non-pressure chronic ulcer of other part of right foot with fat layer exposed: Secondary | ICD-10-CM | POA: Diagnosis not present

## 2021-09-06 DIAGNOSIS — E11621 Type 2 diabetes mellitus with foot ulcer: Secondary | ICD-10-CM | POA: Diagnosis not present

## 2021-09-07 ENCOUNTER — Encounter: Payer: Self-pay | Admitting: Internal Medicine

## 2021-09-11 DIAGNOSIS — E118 Type 2 diabetes mellitus with unspecified complications: Secondary | ICD-10-CM | POA: Diagnosis not present

## 2021-09-17 DIAGNOSIS — H1045 Other chronic allergic conjunctivitis: Secondary | ICD-10-CM | POA: Diagnosis not present

## 2021-09-27 DIAGNOSIS — R768 Other specified abnormal immunological findings in serum: Secondary | ICD-10-CM | POA: Diagnosis not present

## 2021-09-27 DIAGNOSIS — Z8614 Personal history of Methicillin resistant Staphylococcus aureus infection: Secondary | ICD-10-CM | POA: Diagnosis not present

## 2021-09-27 DIAGNOSIS — L97912 Non-pressure chronic ulcer of unspecified part of right lower leg with fat layer exposed: Secondary | ICD-10-CM | POA: Diagnosis not present

## 2021-10-06 ENCOUNTER — Other Ambulatory Visit: Payer: Self-pay | Admitting: Internal Medicine

## 2021-10-06 MED ORDER — OXYCODONE HCL 5 MG PO TABS: 5.0000 mg | ORAL_TABLET | Freq: Two times a day (BID) | ORAL | 0 refills | Status: DC | PRN

## 2021-10-06 NOTE — Telephone Encounter (Signed)
Refilled: 07/29/2021 Last OV: 07/29/2021 Next OV: 10/27/2021

## 2021-10-12 DIAGNOSIS — E118 Type 2 diabetes mellitus with unspecified complications: Secondary | ICD-10-CM | POA: Diagnosis not present

## 2021-10-18 DIAGNOSIS — L97912 Non-pressure chronic ulcer of unspecified part of right lower leg with fat layer exposed: Secondary | ICD-10-CM | POA: Diagnosis not present

## 2021-10-18 DIAGNOSIS — Z89431 Acquired absence of right foot: Secondary | ICD-10-CM | POA: Diagnosis not present

## 2021-10-18 DIAGNOSIS — Z8614 Personal history of Methicillin resistant Staphylococcus aureus infection: Secondary | ICD-10-CM | POA: Diagnosis not present

## 2021-10-18 DIAGNOSIS — R768 Other specified abnormal immunological findings in serum: Secondary | ICD-10-CM | POA: Diagnosis not present

## 2021-10-18 DIAGNOSIS — E11621 Type 2 diabetes mellitus with foot ulcer: Secondary | ICD-10-CM | POA: Diagnosis not present

## 2021-10-18 DIAGNOSIS — L97512 Non-pressure chronic ulcer of other part of right foot with fat layer exposed: Secondary | ICD-10-CM | POA: Diagnosis not present

## 2021-10-27 ENCOUNTER — Telehealth (INDEPENDENT_AMBULATORY_CARE_PROVIDER_SITE_OTHER): Payer: BC Managed Care – PPO | Admitting: Internal Medicine

## 2021-10-27 ENCOUNTER — Encounter: Payer: Self-pay | Admitting: Internal Medicine

## 2021-10-27 VITALS — Ht 70.0 in | Wt 212.0 lb

## 2021-10-27 DIAGNOSIS — R5383 Other fatigue: Secondary | ICD-10-CM

## 2021-10-27 DIAGNOSIS — F331 Major depressive disorder, recurrent, moderate: Secondary | ICD-10-CM

## 2021-10-27 DIAGNOSIS — Z125 Encounter for screening for malignant neoplasm of prostate: Secondary | ICD-10-CM | POA: Diagnosis not present

## 2021-10-27 DIAGNOSIS — E669 Obesity, unspecified: Secondary | ICD-10-CM

## 2021-10-27 DIAGNOSIS — E785 Hyperlipidemia, unspecified: Secondary | ICD-10-CM

## 2021-10-27 DIAGNOSIS — E1169 Type 2 diabetes mellitus with other specified complication: Secondary | ICD-10-CM

## 2021-10-27 DIAGNOSIS — E1142 Type 2 diabetes mellitus with diabetic polyneuropathy: Secondary | ICD-10-CM

## 2021-10-27 MED ORDER — OXYCODONE HCL 5 MG PO TABS: 5.0000 mg | ORAL_TABLET | Freq: Two times a day (BID) | ORAL | 0 refills | Status: DC | PRN

## 2021-10-27 MED ORDER — METFORMIN HCL ER 500 MG PO TB24: ORAL_TABLET | ORAL | 1 refills | Status: DC

## 2021-10-27 NOTE — Assessment & Plan Note (Signed)
He has declined medication again.  Not suicidal . Declines psychiatry and psychology referrals.  ?

## 2021-10-27 NOTE — Progress Notes (Signed)
Virtual Visit via Caregility Note ? ?This visit type was conducted due to national recommendations for restrictions regarding the COVID-19 pandemic (e.g. social distancing).  This format is felt to be most appropriate for this patient at this time.  All issues noted in this document were discussed and addressed.  No physical exam was performed (except for noted visual exam findings with Video Visits).  ? ?I connected withNAME@ on 10/27/21 at 10:30 AM EST by a video enabled telemedicine application oand verified that I am speaking with the correct person using two identifiers. ?Location patient: home ?Location provider: work or home office ?Persons participating in the virtual visit: patient, provider ? ?I discussed the limitations, risks, security and privacy concerns of performing an evaluation and management service by telephone and the availability of in person appointments. I also discussed with the patient that there may be a patient responsible charge related to this service. The patient expressed understanding and agreed to proceed. ? ? ?Reason for visit: medication refill,,follow up on type 2 DM ? ?HPI: ? ?1) Type 2 DM with neuropathy :  using oxycodone at night to rest from the neuropathy . Occasionally takes one tablet during the day and then doesn't use one at night.   Fasting sugars 101  .  Following a low carb diet  50% of the time. Not exercising due to foot wound.  Diabetic eye exam scheduled for April.  Treated for conjunctivitis due to irritant. Resolved with eye drops. ? ?Lab Results  ?Component Value Date  ? HGBA1C 5.5 07/27/2021  ? ?  ? ?2) diabetic foot ulcer  treated for MRSA wound infection  last month with Septra .  Patient states that wound has been draining for 9 months  since his last surgery.  MRI planned for March 6 of right did not have diarrhea despite not taking probiotic   ? ?3) Obesity:  reached nadir of 225 In 2020;  has gained a few lbs  since then,  dinner meal is the problem  .  Eats out 2/week ? ?4) Depression:  aggravated by marital conflict ,  worries about his foot infection and upcoming MRI.   Staying up later,  in the past was encouraged to try wellbutrin .  Discussed counselling .  Calls 988 number ([place] Hotline for depression ).  Spending too much time on the internet.  Doing online classes and job searches but still hoping that he'll be able to look for a job in person .   ? ?ROS: See pertinent positives and negatives per HPI. ? ?Past Medical History:  ?Diagnosis Date  ? Allergy   ? Depression   ? Neuromuscular disorder ([place])   ? neuropathy of back and bilateral feet  ? Sleep apnea   ? ? ?Past Surgical History:  ?Procedure Laterality Date  ? AMPUTATION    ? right foot all toes  ? FOOT SURGERY Right 02/14  ? cyst removed  ? SPINE SURGERY  2004  ? 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 TOE AMPUTATION Right 06/30/2016  ? 5 toes  ? ? ?Family History  ?Problem Relation Age of Onset  ? Heart disease Father   ? Stroke Father   ? Heart attack Father 96  ? Heart disease Maternal Grandmother   ? Stroke Maternal Grandmother   ? Heart disease Paternal Grandfather   ? Stroke Paternal Grandfather   ? ? ?SOCIAL HX:  reports that he has never smoked. He has never used smokeless tobacco. He reports that  he does not drink alcohol and does not use drugs.  ? ? ?Current Outpatient Medications:  ?  aspirin EC 81 MG tablet, Take 81 mg by mouth daily. Swallow whole., Disp: , Rfl:  ?  atorvastatin (LIPITOR) 20 MG tablet, Take 1 tablet (20 mg total) by mouth daily., Disp: 90 tablet, Rfl: 3 ?  Multiple Vitamins-Minerals (MULTIVITAMIN WITH MINERALS) tablet, Take 1 tablet by mouth daily., Disp: , Rfl:  ?  metFORMIN (GLUCOPHAGE-XR) 500 MG 24 hr tablet, TAKE 1 TABLET BY MOUTH EVERY DAY WITH BREAKFAST, Disp: 90 tablet, Rfl: 1 ?  oxyCODONE (ROXICODONE) 5 MG immediate release tablet, Take 1 tablet (5 mg total) by mouth 2 (two) times daily as needed for severe pain., Disp: 45 tablet, Rfl: 0 ?  oxyCODONE (ROXICODONE) 5 MG immediate  release tablet, Take 1 tablet (5 mg total) by mouth 2 (two) times daily as needed for severe pain., Disp: 45 tablet, Rfl: 0 ? ?EXAM: ? ?VITALS per patient if applicable: ? ?GENERAL: alert, oriented, appears well and in no acute distress ? ?HEENT: atraumatic, conjunttiva clear, no obvious abnormalities on inspection of external nose and ears ? ?NECK: normal movements of the head and neck ? ?LUNGS: on inspection no signs of respiratory distress, breathing rate appears normal, no obvious gross SOB, gasping or wheezing ? ?CV: no obvious cyanosis ? ?MS: moves all visible extremities without noticeable abnormality ? ?PSYCH/NEURO: pleasant and cooperative, no obvious depression or anxiety, speech and thought processing grossly intact ? ?ASSESSMENT AND PLAN: ? ?Discussed the following assessment and plan: ? ?Hyperlipidemia associated with type 2 diabetes mellitus ([place]) - Plan: HgB A1c, Comprehensive metabolic panel, Lipid panel, Microalbumin / creatinine urine ratio ? ?Fatigue, unspecified type - Plan: CBC with Differential/Platelet, TSH ? ?Prostate cancer screening - Plan: PSA ? ?Moderate episode of recurrent major depressive disorder ([place]) ? ?Diabetic polyneuropathy associated with type 2 diabetes mellitus ([place]) ? ?Obesity (BMI 30-39.9) ? ?Major depressive disorder, recurrent ([place]) ?He has declined medication again.  Not suicidal . Declines psychiatry and psychology referrals.  ? ?Diabetic neuropathy associated with type 2 diabetes mellitus ([place]) ?His  diabetes remains under excellent control  And His cholesterol and other labs are also normal. He was advised to continue his  current medications and . return in 6 months for follow up on diabetes >  He was reminded to schedule an annual dilated retinal exam to monitor for diabetic retinopathy.  His neuropathy has led to recurrent pressure ulcers and osteomyelitis requiring serial amputations and debridement on the right foot . He is using oxycodone once daily to manage  neuropathic pain  ? ?Obesity (BMI 30-39.9) ?I have addressed  BMI and recommended wt loss of 10% of body weigh over the next 6 months using a low glycemic index diet and regular exercise a minimum of 5 days per week.  ? ?  ?I discussed the assessment and treatment plan with the patient. The patient was provided an opportunity to ask questions and all were answered. The patient agreed with the plan and demonstrated an understanding of the instructions. ?  ?The patient was advised to call back or seek an in-person evaluation if the symptoms worsen or if the condition fails to improve as anticipated. ? ? ?I spent 30 minutes dedicated to the care of this patient on the date of this encounter to include pre-visit review of his medical history,  Face-to-face time with the patient , and post visit ordering of testing and therapeutics.  ? ? ?Crecencio Mc,  MD   ?

## 2021-10-27 NOTE — Assessment & Plan Note (Signed)
His  diabetes remains under excellent control  And His cholesterol and other labs are also normal. He was advised to continue his  current medications and . return in 6 months for follow up on diabetes >  He was reminded to schedule an annual dilated retinal exam to monitor for diabetic retinopathy.  His neuropathy has led to recurrent pressure ulcers and osteomyelitis requiring serial amputations and debridement on the right foot . He is using oxycodone once daily to manage neuropathic pain  ?

## 2021-10-27 NOTE — Assessment & Plan Note (Signed)
I have addressed  BMI and recommended wt loss of 10% of body weigh over the next 6 months using a low glycemic index diet and regular exercise a minimum of 5 days per week.   

## 2021-11-01 ENCOUNTER — Encounter: Payer: Self-pay | Admitting: Internal Medicine

## 2021-11-01 DIAGNOSIS — R768 Other specified abnormal immunological findings in serum: Secondary | ICD-10-CM | POA: Diagnosis not present

## 2021-11-01 DIAGNOSIS — Z8614 Personal history of Methicillin resistant Staphylococcus aureus infection: Secondary | ICD-10-CM | POA: Diagnosis not present

## 2021-11-01 DIAGNOSIS — L97912 Non-pressure chronic ulcer of unspecified part of right lower leg with fat layer exposed: Secondary | ICD-10-CM | POA: Diagnosis not present

## 2021-11-01 DIAGNOSIS — L97512 Non-pressure chronic ulcer of other part of right foot with fat layer exposed: Secondary | ICD-10-CM | POA: Diagnosis not present

## 2021-11-01 DIAGNOSIS — Z89431 Acquired absence of right foot: Secondary | ICD-10-CM | POA: Diagnosis not present

## 2021-11-01 DIAGNOSIS — E11621 Type 2 diabetes mellitus with foot ulcer: Secondary | ICD-10-CM | POA: Diagnosis not present

## 2021-11-01 DIAGNOSIS — Z8739 Personal history of other diseases of the musculoskeletal system and connective tissue: Secondary | ICD-10-CM | POA: Diagnosis not present

## 2021-11-01 DIAGNOSIS — R600 Localized edema: Secondary | ICD-10-CM | POA: Diagnosis not present

## 2021-11-01 DIAGNOSIS — E11622 Type 2 diabetes mellitus with other skin ulcer: Secondary | ICD-10-CM | POA: Diagnosis not present

## 2021-11-01 DIAGNOSIS — L97319 Non-pressure chronic ulcer of right ankle with unspecified severity: Secondary | ICD-10-CM | POA: Diagnosis not present

## 2021-11-04 ENCOUNTER — Telehealth (INDEPENDENT_AMBULATORY_CARE_PROVIDER_SITE_OTHER): Payer: BC Managed Care – PPO | Admitting: Internal Medicine

## 2021-11-04 DIAGNOSIS — L97412 Non-pressure chronic ulcer of right heel and midfoot with fat layer exposed: Secondary | ICD-10-CM | POA: Diagnosis not present

## 2021-11-04 DIAGNOSIS — E08621 Diabetes mellitus due to underlying condition with foot ulcer: Secondary | ICD-10-CM | POA: Diagnosis not present

## 2021-11-04 NOTE — Progress Notes (Signed)
Virtual Visit via Caregility  Note ? ?This visit type was conducted due to national recommendations for restrictions regarding the COVID-19 pandemic (e.g. social distancing).  This format is felt to be most appropriate for this patient at this time.  All issues noted in this document were discussed and addressed.  No physical exam was performed (except for noted visual exam findings with Video Visits).  ? ?I connected withNAME@ on 11/04/21 at 12:00 PM EST by a video enabled telemedicine application and verified that I am speaking with the correct person using two identifiers. ?Location patient: home ?Location provider: work or home office ?Persons participating in the virtual visit: patient, provider ? ?I discussed the limitations, risks, security and privacy concerns of performing an evaluation and management service by telephone and the availability of in person appointments. I also discussed with the patient that there may be a patient responsible charge related to this service. The patient expressed understanding and agreed to proceed. ? ?Interactive audio and video telecommunications were attempted between this provider and patient, however failed, due to patient having technical difficulties OR patient did not have access to video capability.  We continued and completed visit with audio only.  ? ?Reason for visit: wants to discuss his upcoming oot fsurgery ? ?HPI: ? 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  He has  already had a transmetatarsal amputation prior to 2021.  He has had  persistent ulcers on the anterior and dorsal surface of foot that (per wound care) "Was treated a few months ago with final thought to be incision and drainage and closed to scab but has reopened over the last month. Still has draining and tunneling from the  ankle area. No increased redness swelling fever chill or sweat. Glycemic control stable an MRI was ordered of the ankle and the report has been reviewed.  ? 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  Patient states that he has been having discharge for ten months , since the original surgery  which was not addressed until now.  Currently surgery is advised and this will be the 3rd surgery .  He admits that in the past his noncompliance with non weight bearing was a factor in his wound failure to progress but is adamant that since his last surgery he has been compliant.  He is requesting my opinion about what should be done  followiing this surgery  ? ?He has type 3 DM that has been extremely well controlled ? ? ?ROS: See pertinent positives and negatives per HPI. ? ?Past Medical History:  ?Diagnosis Date  ? Allergy   ? Depression   ? Neuromuscular disorder ([place])   ? neuropathy of back and bilateral feet  ? Sleep apnea   ? ? ?Past Surgical History:  ?Procedure Laterality Date  ? AMPUTATION    ? right foot all toes  ? FOOT SURGERY Right 02/14  ? cyst removed  ? SPINE SURGERY  2004  ? 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 TOE AMPUTATION Right 06/30/2016  ? 5 toes  ? ? ?Family History  ?Problem Relation Age of Onset  ? Heart disease Father   ? Stroke Father   ? Heart attack Father 50  ? Heart  disease Maternal Grandmother   ? Stroke Maternal Grandmother   ? Heart disease Paternal Grandfather   ? Stroke Paternal Grandfather   ? ? ?SOCIAL HX:  reports that he has never smoked. He has never used smokeless tobacco. He reports that he does not drink alcohol and does not use drugs.  He has been disabled because of his neuropathy and recurrent surgeries  ? ? ?Current Outpatient Medications:  ?  aspirin EC 81 MG tablet, Take 81 mg by mouth daily. Swallow whole., Disp: , Rfl:  ?  atorvastatin (LIPITOR) 20 MG tablet, Take 1 tablet  (20 mg total) by mouth daily., Disp: 90 tablet, Rfl: 3 ?  metFORMIN (GLUCOPHAGE-XR) 500 MG 24 hr tablet, TAKE 1 TABLET BY MOUTH EVERY DAY WITH BREAKFAST, Disp: 90 tablet, Rfl: 1 ?  Multiple Vitamins-Minerals (MULTIVITAMIN WITH MINERALS) tablet, Take 1 tablet by mouth daily., Disp: , Rfl:  ?  oxyCODONE (ROXICODONE) 5 MG immediate release tablet, Take 1 tablet (5 mg total) by mouth 2 (two) times daily as needed for severe pain., Disp: 45 tablet, Rfl: 0 ? ?EXAM: ? ?VITALS per patient if applicable: ? ?GENERAL: alert, oriented, appears well and in no acute distress ? ?HEENT: atraumatic, conjunttiva clear, no obvious abnormalities on inspection of external nose and ears ? ?NECK: normal movements of the head and neck ? ?LUNGS: on inspection no signs of respiratory distress, breathing rate appears normal, no obvious gross SOB, gasping or wheezing ? ?CV: no obvious cyanosis ? ?MS: moves all visible extremities without noticeable abnormality ? ?PSYCH/NEURO: pleasant and cooperative, no obvious depression or anxiety, speech and thought processing grossly intact ? ?ASSESSMENT AND PLAN: ? ?Discussed the following assessment and plan: ? ?Diabetic ulcer of right midfoot associated with diabetes mellitus due to underlying condition, with fat layer exposed ([place]) ? ?Diabetic ulcer of foot associated with diabetes mellitus due to underlying condition, with fat layer exposed ([place]) ?Reviewed Recent MRI done by podiatry with patient . 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  Recommend to patient that he request follow up with INfectious Disease for post op monitoring of wound  ? ?  ?I discussed the assessment and treatment plan with the patient. The patient was provided an opportunity to ask questions and all were answered. The patient agreed with the plan and demonstrated an understanding of the instructions. ?  ?The patient was advised to call back or seek an in-person evaluation if the symptoms worsen  or if the condition fails to improve as anticipated. ? ? ?I spent 20 minutes dedicated to the care of this patient on the date of this encounter to include pre-visit review of his medical history,  Face-to-face time with the patient , and post visit ordering of testing and therapeutics.  ? ? ?Crecencio Mc, MD   ?

## 2021-11-06 NOTE — Assessment & Plan Note (Addendum)
Reviewed Recent MRI done by podiatry with patient . 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  Recommend to patient that he request follow up with INfectious Disease for post op monitoring of wound  ?

## 2021-11-08 DIAGNOSIS — L97912 Non-pressure chronic ulcer of unspecified part of right lower leg with fat layer exposed: Secondary | ICD-10-CM | POA: Diagnosis not present

## 2021-11-08 DIAGNOSIS — E11621 Type 2 diabetes mellitus with foot ulcer: Secondary | ICD-10-CM | POA: Diagnosis not present

## 2021-11-08 DIAGNOSIS — L97512 Non-pressure chronic ulcer of other part of right foot with fat layer exposed: Secondary | ICD-10-CM | POA: Diagnosis not present

## 2021-11-08 DIAGNOSIS — Z969 Presence of functional implant, unspecified: Secondary | ICD-10-CM | POA: Diagnosis not present

## 2021-11-09 DIAGNOSIS — E118 Type 2 diabetes mellitus with unspecified complications: Secondary | ICD-10-CM | POA: Diagnosis not present

## 2021-11-11 ENCOUNTER — Telehealth: Payer: Self-pay | Admitting: *Deleted

## 2021-11-11 NOTE — Telephone Encounter (Signed)
I called patient and informed him that I do not see in our system that we have treated him.  He stated he saw a Male her and that she also worked in [place].  I informed him that we may have seen him before but it's been over 10 years and that his files may have been shredded. ?

## 2021-11-11 NOTE — Telephone Encounter (Signed)
"  I'm calling in reference to getting some medical records.  I'm applying for some Social Security Benefits." ?

## 2021-11-11 NOTE — Telephone Encounter (Signed)
"  I'm calling in reference to getting some records from some surgeries and some visits, starting back in the year 2011.  Give me a call." ?

## 2021-12-07 DIAGNOSIS — G4730 Sleep apnea, unspecified: Secondary | ICD-10-CM | POA: Diagnosis not present

## 2021-12-07 DIAGNOSIS — E1140 Type 2 diabetes mellitus with diabetic neuropathy, unspecified: Secondary | ICD-10-CM | POA: Diagnosis not present

## 2021-12-07 DIAGNOSIS — L02611 Cutaneous abscess of right foot: Secondary | ICD-10-CM | POA: Diagnosis not present

## 2021-12-07 DIAGNOSIS — E11621 Type 2 diabetes mellitus with foot ulcer: Secondary | ICD-10-CM | POA: Diagnosis not present

## 2021-12-07 DIAGNOSIS — Z7982 Long term (current) use of aspirin: Secondary | ICD-10-CM | POA: Diagnosis not present

## 2021-12-07 DIAGNOSIS — E1169 Type 2 diabetes mellitus with other specified complication: Secondary | ICD-10-CM | POA: Diagnosis not present

## 2021-12-07 DIAGNOSIS — Z79899 Other long term (current) drug therapy: Secondary | ICD-10-CM | POA: Diagnosis not present

## 2021-12-07 DIAGNOSIS — G8918 Other acute postprocedural pain: Secondary | ICD-10-CM | POA: Diagnosis not present

## 2021-12-07 DIAGNOSIS — Z8249 Family history of ischemic heart disease and other diseases of the circulatory system: Secondary | ICD-10-CM | POA: Diagnosis not present

## 2021-12-07 DIAGNOSIS — Z833 Family history of diabetes mellitus: Secondary | ICD-10-CM | POA: Diagnosis not present

## 2021-12-07 DIAGNOSIS — Z8261 Family history of arthritis: Secondary | ICD-10-CM | POA: Diagnosis not present

## 2021-12-07 DIAGNOSIS — Z969 Presence of functional implant, unspecified: Secondary | ICD-10-CM | POA: Diagnosis not present

## 2021-12-07 DIAGNOSIS — E785 Hyperlipidemia, unspecified: Secondary | ICD-10-CM | POA: Diagnosis not present

## 2021-12-07 DIAGNOSIS — M86671 Other chronic osteomyelitis, right ankle and foot: Secondary | ICD-10-CM | POA: Diagnosis not present

## 2021-12-07 DIAGNOSIS — Z89431 Acquired absence of right foot: Secondary | ICD-10-CM | POA: Diagnosis not present

## 2021-12-07 DIAGNOSIS — Z823 Family history of stroke: Secondary | ICD-10-CM | POA: Diagnosis not present

## 2021-12-07 DIAGNOSIS — Z7984 Long term (current) use of oral hypoglycemic drugs: Secondary | ICD-10-CM | POA: Diagnosis not present

## 2021-12-07 DIAGNOSIS — L97512 Non-pressure chronic ulcer of other part of right foot with fat layer exposed: Secondary | ICD-10-CM | POA: Diagnosis not present

## 2021-12-07 DIAGNOSIS — L97912 Non-pressure chronic ulcer of unspecified part of right lower leg with fat layer exposed: Secondary | ICD-10-CM | POA: Diagnosis not present

## 2021-12-07 DIAGNOSIS — L97812 Non-pressure chronic ulcer of other part of right lower leg with fat layer exposed: Secondary | ICD-10-CM | POA: Diagnosis not present

## 2021-12-08 ENCOUNTER — Encounter: Payer: Self-pay | Admitting: Internal Medicine

## 2021-12-10 DIAGNOSIS — E118 Type 2 diabetes mellitus with unspecified complications: Secondary | ICD-10-CM | POA: Diagnosis not present

## 2021-12-14 DIAGNOSIS — G609 Hereditary and idiopathic neuropathy, unspecified: Secondary | ICD-10-CM | POA: Diagnosis not present

## 2021-12-16 DIAGNOSIS — R768 Other specified abnormal immunological findings in serum: Secondary | ICD-10-CM | POA: Diagnosis not present

## 2021-12-16 DIAGNOSIS — E11621 Type 2 diabetes mellitus with foot ulcer: Secondary | ICD-10-CM | POA: Diagnosis not present

## 2021-12-16 DIAGNOSIS — M86671 Other chronic osteomyelitis, right ankle and foot: Secondary | ICD-10-CM | POA: Diagnosis not present

## 2021-12-16 DIAGNOSIS — Z8614 Personal history of Methicillin resistant Staphylococcus aureus infection: Secondary | ICD-10-CM | POA: Diagnosis not present

## 2021-12-16 DIAGNOSIS — L97512 Non-pressure chronic ulcer of other part of right foot with fat layer exposed: Secondary | ICD-10-CM | POA: Diagnosis not present

## 2021-12-17 DIAGNOSIS — S91309A Unspecified open wound, unspecified foot, initial encounter: Secondary | ICD-10-CM | POA: Diagnosis not present

## 2021-12-24 ENCOUNTER — Other Ambulatory Visit: Payer: BC Managed Care – PPO

## 2021-12-29 ENCOUNTER — Ambulatory Visit: Payer: BC Managed Care – PPO | Admitting: Internal Medicine

## 2021-12-30 ENCOUNTER — Other Ambulatory Visit: Payer: Self-pay | Admitting: Internal Medicine

## 2021-12-31 NOTE — Telephone Encounter (Signed)
LMTCB

## 2021-12-31 NOTE — Telephone Encounter (Signed)
Pt has enough until Dr. Lupita Dawn return & will send to her. ?

## 2021-12-31 NOTE — Telephone Encounter (Signed)
Is he going to be out of this medication before Dr. Derrel Nip returns to the office?  Can you find out if he has Narcan on hand?  This is a medication I like patients to have on hand when they are on chronic narcotics in case there are any issues with opiate overdose.  I can send that in if I am going to refill his oxycodone. ?

## 2022-01-02 MED ORDER — OXYCODONE HCL 5 MG PO TABS: 5.0000 mg | ORAL_TABLET | Freq: Two times a day (BID) | ORAL | 0 refills | Status: DC | PRN

## 2022-01-02 NOTE — Telephone Encounter (Signed)
Oxycodone refilled.  In patient office visit needed  prior to next refill.  ?

## 2022-01-03 NOTE — Telephone Encounter (Signed)
Appt is scheduled for in office appt.  ?

## 2022-01-06 DIAGNOSIS — Z89431 Acquired absence of right foot: Secondary | ICD-10-CM | POA: Diagnosis not present

## 2022-01-06 DIAGNOSIS — E11621 Type 2 diabetes mellitus with foot ulcer: Secondary | ICD-10-CM | POA: Diagnosis not present

## 2022-01-06 DIAGNOSIS — L97512 Non-pressure chronic ulcer of other part of right foot with fat layer exposed: Secondary | ICD-10-CM | POA: Diagnosis not present

## 2022-01-09 DIAGNOSIS — E118 Type 2 diabetes mellitus with unspecified complications: Secondary | ICD-10-CM | POA: Diagnosis not present

## 2022-01-10 ENCOUNTER — Other Ambulatory Visit: Payer: BC Managed Care – PPO

## 2022-01-10 ENCOUNTER — Encounter: Payer: Self-pay | Admitting: Internal Medicine

## 2022-01-12 ENCOUNTER — Ambulatory Visit: Payer: BC Managed Care – PPO | Admitting: Internal Medicine

## 2022-01-14 DIAGNOSIS — M86671 Other chronic osteomyelitis, right ankle and foot: Secondary | ICD-10-CM | POA: Diagnosis not present

## 2022-01-14 DIAGNOSIS — Z89431 Acquired absence of right foot: Secondary | ICD-10-CM | POA: Diagnosis not present

## 2022-01-14 DIAGNOSIS — M21171 Varus deformity, not elsewhere classified, right ankle: Secondary | ICD-10-CM | POA: Diagnosis not present

## 2022-01-14 DIAGNOSIS — L97912 Non-pressure chronic ulcer of unspecified part of right lower leg with fat layer exposed: Secondary | ICD-10-CM | POA: Diagnosis not present

## 2022-01-20 DIAGNOSIS — L97512 Non-pressure chronic ulcer of other part of right foot with fat layer exposed: Secondary | ICD-10-CM | POA: Diagnosis not present

## 2022-01-20 DIAGNOSIS — Z89431 Acquired absence of right foot: Secondary | ICD-10-CM | POA: Diagnosis not present

## 2022-01-20 DIAGNOSIS — L97912 Non-pressure chronic ulcer of unspecified part of right lower leg with fat layer exposed: Secondary | ICD-10-CM | POA: Diagnosis not present

## 2022-01-20 DIAGNOSIS — E11621 Type 2 diabetes mellitus with foot ulcer: Secondary | ICD-10-CM | POA: Diagnosis not present

## 2022-01-20 DIAGNOSIS — M86671 Other chronic osteomyelitis, right ankle and foot: Secondary | ICD-10-CM | POA: Diagnosis not present

## 2022-01-23 DIAGNOSIS — R0981 Nasal congestion: Secondary | ICD-10-CM | POA: Diagnosis not present

## 2022-01-23 DIAGNOSIS — R058 Other specified cough: Secondary | ICD-10-CM | POA: Diagnosis not present

## 2022-01-23 DIAGNOSIS — J019 Acute sinusitis, unspecified: Secondary | ICD-10-CM | POA: Diagnosis not present

## 2022-01-30 DIAGNOSIS — J069 Acute upper respiratory infection, unspecified: Secondary | ICD-10-CM | POA: Diagnosis not present

## 2022-02-02 DIAGNOSIS — M86671 Other chronic osteomyelitis, right ankle and foot: Secondary | ICD-10-CM | POA: Diagnosis not present

## 2022-02-02 DIAGNOSIS — L97912 Non-pressure chronic ulcer of unspecified part of right lower leg with fat layer exposed: Secondary | ICD-10-CM | POA: Diagnosis not present

## 2022-02-02 DIAGNOSIS — E11621 Type 2 diabetes mellitus with foot ulcer: Secondary | ICD-10-CM | POA: Diagnosis not present

## 2022-02-02 DIAGNOSIS — L97512 Non-pressure chronic ulcer of other part of right foot with fat layer exposed: Secondary | ICD-10-CM | POA: Diagnosis not present

## 2022-02-02 DIAGNOSIS — Z89431 Acquired absence of right foot: Secondary | ICD-10-CM | POA: Diagnosis not present

## 2022-02-21 ENCOUNTER — Encounter: Payer: Self-pay | Admitting: Internal Medicine

## 2022-02-21 ENCOUNTER — Other Ambulatory Visit: Payer: Self-pay | Admitting: Internal Medicine

## 2022-02-21 MED ORDER — OXYCODONE HCL 5 MG PO TABS: 5.0000 mg | ORAL_TABLET | Freq: Two times a day (BID) | ORAL | 0 refills | Status: DC | PRN

## 2022-02-22 ENCOUNTER — Other Ambulatory Visit (INDEPENDENT_AMBULATORY_CARE_PROVIDER_SITE_OTHER): Payer: BC Managed Care – PPO

## 2022-02-22 DIAGNOSIS — Z125 Encounter for screening for malignant neoplasm of prostate: Secondary | ICD-10-CM

## 2022-02-22 DIAGNOSIS — R5383 Other fatigue: Secondary | ICD-10-CM

## 2022-02-22 DIAGNOSIS — E785 Hyperlipidemia, unspecified: Secondary | ICD-10-CM

## 2022-02-22 DIAGNOSIS — E1169 Type 2 diabetes mellitus with other specified complication: Secondary | ICD-10-CM | POA: Diagnosis not present

## 2022-02-22 LAB — CBC WITH DIFFERENTIAL/PLATELET
Basophils Absolute: 0.2 10*3/uL — ABNORMAL HIGH (ref 0.0–0.1)
Basophils Relative: 3.1 % — ABNORMAL HIGH (ref 0.0–3.0)
Eosinophils Absolute: 0.3 10*3/uL (ref 0.0–0.7)
Eosinophils Relative: 5.2 % — ABNORMAL HIGH (ref 0.0–5.0)
HCT: 47.8 % (ref 39.0–52.0)
Hemoglobin: 16.1 g/dL (ref 13.0–17.0)
Lymphocytes Relative: 24.2 % (ref 12.0–46.0)
Lymphs Abs: 1.3 10*3/uL (ref 0.7–4.0)
MCHC: 33.8 g/dL (ref 30.0–36.0)
MCV: 91.2 fl (ref 78.0–100.0)
Monocytes Absolute: 0.8 10*3/uL (ref 0.1–1.0)
Monocytes Relative: 15.2 % — ABNORMAL HIGH (ref 3.0–12.0)
Neutro Abs: 2.7 10*3/uL (ref 1.4–7.7)
Neutrophils Relative %: 52.3 % (ref 43.0–77.0)
Platelets: 177 10*3/uL (ref 150.0–400.0)
RBC: 5.24 Mil/uL (ref 4.22–5.81)
RDW: 14.9 % (ref 11.5–15.5)
WBC: 5.2 10*3/uL (ref 4.0–10.5)

## 2022-02-22 LAB — COMPREHENSIVE METABOLIC PANEL
ALT: 31 U/L (ref 0–53)
AST: 25 U/L (ref 0–37)
Albumin: 4.2 g/dL (ref 3.5–5.2)
Alkaline Phosphatase: 43 U/L (ref 39–117)
BUN: 14 mg/dL (ref 6–23)
CO2: 24 mEq/L (ref 19–32)
Calcium: 8.7 mg/dL (ref 8.4–10.5)
Chloride: 106 mEq/L (ref 96–112)
Creatinine, Ser: 0.83 mg/dL (ref 0.40–1.50)
GFR: 97.13 mL/min (ref >60.00)
Glucose, Bld: 104 mg/dL — ABNORMAL HIGH (ref 70–99)
Potassium: 4.1 mEq/L (ref 3.5–5.1)
Sodium: 138 mEq/L (ref 135–145)
Total Bilirubin: 0.8 mg/dL (ref 0.2–1.2)
Total Protein: 7.1 g/dL (ref 6.0–8.3)

## 2022-02-22 LAB — MICROALBUMIN / CREATININE URINE RATIO
Creatinine,U: 123.4 mg/dL
Microalb Creat Ratio: 2.6 mg/g (ref 0.0–30.0)
Microalb, Ur: 3.2 mg/dL — ABNORMAL HIGH (ref 0.0–1.9)

## 2022-02-22 LAB — LIPID PANEL
Cholesterol: 115 mg/dL (ref 0–200)
HDL: 36.2 mg/dL — ABNORMAL LOW (ref >39.00)
LDL Cholesterol: 64 mg/dL (ref 0–99)
NonHDL: 78.99
Total CHOL/HDL Ratio: 3
Triglycerides: 73 mg/dL (ref 0.0–149.0)
VLDL: 14.6 mg/dL (ref 0.0–40.0)

## 2022-02-22 LAB — TSH: TSH: 0.59 u[IU]/mL (ref 0.35–5.50)

## 2022-02-22 LAB — PSA: PSA: 0.18 ng/mL (ref 0.10–4.00)

## 2022-02-22 LAB — HEMOGLOBIN A1C: Hgb A1c MFr Bld: 5.6 % (ref 4.6–6.5)

## 2022-03-28 ENCOUNTER — Telehealth: Payer: Self-pay | Admitting: Internal Medicine

## 2022-03-28 ENCOUNTER — Ambulatory Visit: Payer: BC Managed Care – PPO | Admitting: Internal Medicine

## 2022-03-28 ENCOUNTER — Encounter: Payer: Self-pay | Admitting: Internal Medicine

## 2022-03-28 VITALS — BP 124/72 | HR 80 | Temp 98.6°F | Resp 17 | Ht 70.0 in | Wt 219.2 lb

## 2022-03-28 DIAGNOSIS — G609 Hereditary and idiopathic neuropathy, unspecified: Secondary | ICD-10-CM | POA: Diagnosis not present

## 2022-03-28 DIAGNOSIS — F331 Major depressive disorder, recurrent, moderate: Secondary | ICD-10-CM

## 2022-03-28 DIAGNOSIS — E785 Hyperlipidemia, unspecified: Secondary | ICD-10-CM

## 2022-03-28 DIAGNOSIS — Z1211 Encounter for screening for malignant neoplasm of colon: Secondary | ICD-10-CM

## 2022-03-28 DIAGNOSIS — E08621 Diabetes mellitus due to underlying condition with foot ulcer: Secondary | ICD-10-CM | POA: Diagnosis not present

## 2022-03-28 DIAGNOSIS — G608 Other hereditary and idiopathic neuropathies: Secondary | ICD-10-CM

## 2022-03-28 DIAGNOSIS — L97412 Non-pressure chronic ulcer of right heel and midfoot with fat layer exposed: Secondary | ICD-10-CM

## 2022-03-28 DIAGNOSIS — G4733 Obstructive sleep apnea (adult) (pediatric): Secondary | ICD-10-CM | POA: Diagnosis not present

## 2022-03-28 DIAGNOSIS — Z89431 Acquired absence of right foot: Secondary | ICD-10-CM

## 2022-03-28 DIAGNOSIS — E669 Obesity, unspecified: Secondary | ICD-10-CM

## 2022-03-28 DIAGNOSIS — Z0001 Encounter for general adult medical examination with abnormal findings: Secondary | ICD-10-CM

## 2022-03-28 DIAGNOSIS — E1169 Type 2 diabetes mellitus with other specified complication: Secondary | ICD-10-CM

## 2022-03-28 DIAGNOSIS — E1129 Type 2 diabetes mellitus with other diabetic kidney complication: Secondary | ICD-10-CM

## 2022-03-28 DIAGNOSIS — Z0000 Encounter for general adult medical examination without abnormal findings: Secondary | ICD-10-CM

## 2022-03-28 DIAGNOSIS — D582 Other hemoglobinopathies: Secondary | ICD-10-CM

## 2022-03-28 MED ORDER — OXYCODONE HCL 5 MG PO TABS
5.0000 mg | ORAL_TABLET | Freq: Every evening | ORAL | 0 refills | Status: DC | PRN
Start: 2022-03-28 — End: 2022-05-08

## 2022-03-28 MED ORDER — TELMISARTAN 20 MG PO TABS: 20.0000 mg | ORAL_TABLET | Freq: Every day | ORAL | 0 refills | Status: DC

## 2022-03-28 MED ORDER — DULOXETINE HCL 30 MG PO CPEP
30.0000 mg | ORAL_CAPSULE | Freq: Every day | ORAL | 3 refills | Status: DC
Start: 2022-03-28 — End: 2022-04-26

## 2022-03-28 NOTE — Patient Instructions (Signed)
I have increased your dose of cymbalta to 30 mg daily.  You were on the lowest dose.    Limit your use of oxycodone to 5 mg daily MAXIMUM.  Reduce dose if cymbalta is helping or making you drowsy at night  Your daytime sleepiness IS DUE TO UNTREATED SLEEP APNEA.   I will initiate the order for your colon cancer screening  Test, the one called  Cologuard.  It will be delivered to your house, and you will send off a stool sample in the envelope it provides.      Stop eating your coworker's treats!  She is sabotaging your diet !

## 2022-03-28 NOTE — Assessment & Plan Note (Signed)
Remains untreated due to lack of CPAP titration study . This is the cause of his daytime sleepiness.

## 2022-03-28 NOTE — Assessment & Plan Note (Signed)
He has been nonadherent to diet due to workplace distractions. He is exercising regularly now.

## 2022-03-28 NOTE — Assessment & Plan Note (Signed)
April surgery and biopsy of right foot ulcer noted chronic osteomyelitis

## 2022-03-28 NOTE — Assessment & Plan Note (Addendum)
With foot drop, managed with foot brace right foot ordered by his podiatrist at [facility] and suppled by the [facility] at [facility] .  He has a new job which allows him to sit , and uses a scooter at home in the house.

## 2022-03-28 NOTE — Progress Notes (Signed)
The patient is here for annual preventive examination and management of other chronic and acute problems.   The risk factors are reflected in the social history.   The roster of all physicians providing medical care to patient - is listed in the Snapshot section of the chart.   Activities of daily living:  The patient is 100% independent in all ADLs: dressing, toileting, feeding as well as independent mobility   Home safety : The patient has smoke detectors in the home. They wear seatbelts.  There are no unsecured firearms at home. There is no violence in the home.    There is no risks for hepatitis, STDs or HIV. There is no   history of blood transfusion. They have no travel history to infectious disease endemic areas of the world.   The patient has seen their dentist in the last six month. They have seen their eye doctor in the last year. The patinet  denies slight hearing difficulty with regard to whispered voices and some television programs.  They have deferred audiologic testing in the last year.  They do not  have excessive sun exposure. Discussed the need for sun protection: hats, long sleeves and use of sunscreen if there is significant sun exposure.    Diet: the importance of a healthy diet is discussed. They do have a healthy diet.   The benefits of regular aerobic exercise were discussed. The patient  has begun to exercise regularly for 30 minutes  5 days per week  for  60 minutes.    Depression screen: there are no signs or vegative symptoms of untreateddepression- irritability, change in appetite, anhedonia, sadness/tearfullness.   The following portions of the patient's history were reviewed and updated as appropriate: allergies, current medications, past family history, past medical history,  past surgical history, past social history  and problem list.   Visual acuity was not assessed per patient preference since the patient has regular follow up with an  ophthalmologist.  Hearing and body mass index were assessed and reviewed.    During the course of the visit the patient was educated and counseled about appropriate screening and preventive services including : fall prevention , diabetes screening, nutrition counseling, colorectal cancer screening, and recommended immunizations.    Chief Complaint:    1) worsening neuropathy : now affecting hands.  Taking cymbalta 20 mg daily and 5 mg oxycodone at night .  Will increase the cymbalta to 30 mg daily.  Follows up with Dr Manuella Ghazi next moth for additional testing   2) untreated OSA:  daytime sleepiness noted by wife.  Has been procrastinating about rescheduling CPAP titration study   3) Obesity:    HAS BEEN off the diet due to a coworker bringing in off limit foods.  Has joined a gym and is working out for 30 minutes at lunchtime ,  tries to do 5 times   Review of Symptoms  Patient denies headache, fevers, malaise, unintentional weight loss, skin rash, eye pain, sinus congestion and sinus pain, sore throat, dysphagia,  hemoptysis , cough, dyspnea, wheezing, chest pain, palpitations, orthopnea, edema, abdominal pain, nausea, melena, diarrhea, constipation, flank pain, dysuria, hematuria, urinary  Frequency, nocturia, , seizures,  Focal weakness, Loss of consciousness,  Tremor, insomnia,  anxiety, and suicidal ideation.    Physical Exam:  BP 124/72 (BP Location: Left Arm, Patient Position: Sitting, Cuff Size: Large)   Pulse 80   Temp 98.6 F (37 C) (Temporal)   Resp 17   Ht 5'  10" (1.778 m)   Wt 219 lb 3.2 oz (99.4 kg)   SpO2 97%   BMI 31.45 kg/m    General appearance: alert, cooperative and appears stated age Ears: normal TM's and external ear canals both ears Throat: lips, mucosa, and tongue normal; teeth and gums normal Neck: no adenopathy, no carotid bruit, supple, symmetrical, trachea midline and thyroid not enlarged, symmetric, no tenderness/mass/nodules Back: symmetric, no curvature. ROM normal. No CVA  tenderness. Lungs: clear to auscultation bilaterally Heart: regular rate and rhythm, S1, S2 normal, no murmur, click, rub or gallop Abdomen: soft, non-tender; bowel sounds normal; no masses,  no organomegaly Pulses: 2+ and symmetric Skin: Skin color, texture, turgor normal. No rashes or lesions Lymph nodes: Cervical, supraclavicular, and axillary nodes normal.   Assessment and Plan:  Polyneuropathy, peripheral sensorimotor axonal With foot drop, managed with foot brace right foot ordered by his podiatrist at [facility] and suppled by the [facility] at [facility] .  He has a new job which allows him to sit , and uses a scooter at home in the house.   Major depressive disorder, recurrent ([place]) Aggravated by marital conflict.  He is confused about the status of his relationship with his wife who has threatened divorce due to his medical noncompliance and lack of financial stability,  But  he has now found employment  . Declines psychiatry and psychology referrals.   Diabetic ulcer of foot associated with diabetes mellitus due to underlying condition, with fat layer exposed [facility]) April surgery and biopsy of right foot ulcer noted chronic osteomyelitis   Idiopathic peripheral neuropathy Now affecting his hands.  Currently taking cymbalta starting in April at 20 mg dose,  prescribed by Dr Manuella Ghazi. Will increase dose to 30 mg daily  Still using oxycodone 5 mg dose in the evening.  Will reduce quantity to 30 /month  Repeat studies are planned   OSA (obstructive sleep apnea) Remains untreated due to lack of CPAP titration study . This is the cause of his daytime sleepiness.   Routine general medical examination at a health care facility No prior colon ca screening.  No first degree relatives  With colon CA   .  Using cologuard until his untreated sleep apnea  is addressed and treated  age appropriate education and counseling updated, referrals for preventative services and immunizations addressed, dietary and  smoking counseling addressed, most recent labs reviewed.  I have personally reviewed and have noted:   1) the patient's medical and social history 2) The pt's use of alcohol, tobacco, and illicit drugs 3) The patient's current medications and supplements 4) Functional ability including ADL's, fall risk, home safety risk, hearing and visual impairment 5) Diet and physical activities 6) Evidence for depression or mood disorder 7) The patient's height, weight, and BMI have been recorded in the chart 8) I have ordered and reviewed a 12 lead EKG and find that there are no acute changes and patient is in sinus rhythm.     I have made referrals, and provided counseling and education based on review of the above  S/P transmetatarsal amputation of foot, right [facility]) Repeat surgery done April 203 noting chronic osteomyelitis   Hyperlipidemia associated with type 2 diabetes mellitus ([place]) Currently well-controlled on current medications .  hemoglobin A1c is at goal of less than 7.0 . Patient is up to date on eye exam and has frequent podiatry and wound care for right foot ulcer   Patient has early  Microalbuminuria but no hypertension  Recommend trial of low dose telmisartan. Patient is tolerating statin therapy for CAD risk reduction  Lab Results  Component Value Date   HGBA1C 5.6 02/22/2022   Lab Results  Component Value Date   MICROALBUR 3.2 (H) 02/22/2022   MICROALBUR 6.9 (H) 10/01/2020     Lab Results  Component Value Date   CHOL 115 02/22/2022   HDL 36.20 (L) 02/22/2022   LDLCALC 64 02/22/2022   LDLDIRECT 121.0 11/27/2019   TRIG 73.0 02/22/2022   CHOLHDL 3 02/22/2022    Elevated hemoglobin (HCC) Likely secondary to untreated sleep apnea.  Hematology referral x 2,  (2016 and 2022) patient has not  Been seen.  Current hgb is normal /   Lab Results  Component Value Date   WBC 5.2 02/22/2022   HGB 16.1 02/22/2022   HCT 47.8 02/22/2022   MCV 91.2 02/22/2022   PLT 177.0  02/22/2022     Obesity (BMI 30-39.9) He has been nonadherent to diet due to workplace distractions. He is exercising regularly now.    Updated Medication List Outpatient Encounter Medications as of 03/28/2022  Medication Sig   aspirin EC 81 MG tablet Take 81 mg by mouth daily. Swallow whole.   atorvastatin (LIPITOR) 20 MG tablet Take 1 tablet (20 mg total) by mouth daily.   DULoxetine (CYMBALTA) 30 MG capsule Take 1 capsule (30 mg total) by mouth daily.   metFORMIN (GLUCOPHAGE-XR) 500 MG 24 hr tablet TAKE 1 TABLET BY MOUTH EVERY DAY WITH BREAKFAST   Multiple Vitamins-Minerals (MULTIVITAMIN WITH MINERALS) tablet Take 1 tablet by mouth daily.   telmisartan (MICARDIS) 20 MG tablet Take 1 tablet (20 mg total) by mouth at bedtime.   [DISCONTINUED] DULoxetine (CYMBALTA) 20 MG capsule Take 20 mg by mouth every morning.   [DISCONTINUED] oxyCODONE (ROXICODONE) 5 MG immediate release tablet Take 1 tablet (5 mg total) by mouth 2 (two) times daily as needed for severe pain.   oxyCODONE (ROXICODONE) 5 MG immediate release tablet Take 1 tablet (5 mg total) by mouth at bedtime as needed for severe pain.   No facility-administered encounter medications on file as of 03/28/2022.

## 2022-03-28 NOTE — Assessment & Plan Note (Signed)
Repeat surgery done April 203 noting chronic osteomyelitis

## 2022-03-28 NOTE — Assessment & Plan Note (Signed)
Likely secondary to untreated sleep apnea.  Hematology referral x 2,  (2016 and 2022) patient has not  Been seen.  Current hgb is normal /   Lab Results  Component Value Date   WBC 5.2 02/22/2022   HGB 16.1 02/22/2022   HCT 47.8 02/22/2022   MCV 91.2 02/22/2022   PLT 177.0 02/22/2022

## 2022-03-28 NOTE — Assessment & Plan Note (Addendum)
Currently well-controlled on current medications .  hemoglobin A1c is at goal of less than 7.0 . Patient is up to date on eye exam and has frequent podiatry and wound care for right foot ulcer   Patient has early  Microalbuminuria but no hypertension   Recommend trial of low dose telmisartan. Patient is tolerating statin therapy for CAD risk reduction  Lab Results  Component Value Date   HGBA1C 5.6 02/22/2022   Lab Results  Component Value Date   MICROALBUR 3.2 (H) 02/22/2022   MICROALBUR 6.9 (H) 10/01/2020     Lab Results  Component Value Date   CHOL 115 02/22/2022   HDL 36.20 (L) 02/22/2022   LDLCALC 64 02/22/2022   LDLDIRECT 121.0 11/27/2019   TRIG 73.0 02/22/2022   CHOLHDL 3 02/22/2022

## 2022-03-28 NOTE — Assessment & Plan Note (Signed)
Aggravated by marital conflict.  He is confused about the status of his relationship with his wife who has threatened divorce due to his medical noncompliance and lack of financial stability,  But  he has now found employment  . Declines psychiatry and psychology referrals.

## 2022-03-28 NOTE — Assessment & Plan Note (Addendum)
No prior colon ca screening.  No first degree relatives  With colon CA   .  Using cologuard until his untreated sleep apnea  is addressed and treated  age appropriate education and counseling updated, referrals for preventative services and immunizations addressed, dietary and smoking counseling addressed, most recent labs reviewed.  I have personally reviewed and have noted:   1) the patient's medical and social history 2) The pt's use of alcohol, tobacco, and illicit drugs 3) The patient's current medications and supplements 4) Functional ability including ADL's, fall risk, home safety risk, hearing and visual impairment 5) Diet and physical activities 6) Evidence for depression or mood disorder 7) The patient's height, weight, and BMI have been recorded in the chart 8) I have ordered and reviewed a 12 lead EKG and find that there are no acute changes and patient is in sinus rhythm.     I have made referrals, and provided counseling and education based on review of the above

## 2022-03-28 NOTE — Assessment & Plan Note (Addendum)
Now affecting his hands.  Currently taking cymbalta starting in April at 20 mg dose,  prescribed by Dr Manuella Ghazi. Will increase dose to 30 mg daily  Still using oxycodone 5 mg dose in the evening.  Will reduce quantity to 30 /month  Repeat studies are planned

## 2022-03-29 ENCOUNTER — Telehealth: Payer: Self-pay

## 2022-03-29 NOTE — Telephone Encounter (Signed)
PA for Oxycodone has ben submitted on covermymeds.

## 2022-03-31 ENCOUNTER — Telehealth: Payer: Self-pay

## 2022-03-31 NOTE — Telephone Encounter (Signed)
LMTCB to let patient know that oxycodone prescription was approved 03/29/22-09/29/22.

## 2022-03-31 NOTE — Telephone Encounter (Signed)
Patient states he is returning our call.  I let patient know that Cheri Rous, CMA, called him and I read Sarah's message to patient.

## 2022-04-26 ENCOUNTER — Other Ambulatory Visit: Payer: Self-pay | Admitting: Internal Medicine

## 2022-04-26 ENCOUNTER — Other Ambulatory Visit (INDEPENDENT_AMBULATORY_CARE_PROVIDER_SITE_OTHER): Payer: BC Managed Care – PPO

## 2022-04-26 DIAGNOSIS — E1129 Type 2 diabetes mellitus with other diabetic kidney complication: Secondary | ICD-10-CM | POA: Diagnosis not present

## 2022-04-26 DIAGNOSIS — R809 Proteinuria, unspecified: Secondary | ICD-10-CM

## 2022-04-27 ENCOUNTER — Encounter: Payer: Self-pay | Admitting: Internal Medicine

## 2022-04-27 LAB — BASIC METABOLIC PANEL
BUN: 20 mg/dL (ref 6–23)
CO2: 23 mEq/L (ref 19–32)
Calcium: 9.2 mg/dL (ref 8.4–10.5)
Chloride: 105 mEq/L (ref 96–112)
Creatinine, Ser: 0.88 mg/dL (ref 0.40–1.50)
GFR: 95.31 mL/min (ref >60.00)
Glucose, Bld: 86 mg/dL (ref 70–99)
Potassium: 4 mEq/L (ref 3.5–5.1)
Sodium: 139 mEq/L (ref 135–145)

## 2022-05-08 ENCOUNTER — Other Ambulatory Visit: Payer: Self-pay | Admitting: Internal Medicine

## 2022-05-09 MED ORDER — OXYCODONE HCL 5 MG PO TABS: 5.0000 mg | ORAL_TABLET | Freq: Every evening | ORAL | 0 refills | Status: DC | PRN

## 2022-05-18 ENCOUNTER — Encounter: Payer: Self-pay | Admitting: Internal Medicine

## 2022-05-19 NOTE — Telephone Encounter (Signed)
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

## 2022-05-20 ENCOUNTER — Encounter: Payer: Self-pay | Admitting: Internal Medicine

## 2022-05-20 ENCOUNTER — Telehealth (INDEPENDENT_AMBULATORY_CARE_PROVIDER_SITE_OTHER): Payer: BC Managed Care – PPO | Admitting: Internal Medicine

## 2022-05-20 DIAGNOSIS — N410 Acute prostatitis: Secondary | ICD-10-CM

## 2022-05-20 DIAGNOSIS — K5900 Constipation, unspecified: Secondary | ICD-10-CM | POA: Insufficient documentation

## 2022-05-20 HISTORY — DX: Acute prostatitis: N41.0

## 2022-05-20 MED ORDER — CIPROFLOXACIN HCL 500 MG PO TABS: 500.0000 mg | ORAL_TABLET | Freq: Two times a day (BID) | ORAL | 0 refills | Status: AC

## 2022-05-20 NOTE — Assessment & Plan Note (Signed)
Advised to use miralax daily for resolve constipation

## 2022-05-20 NOTE — Progress Notes (Signed)
Virtual Visit via [place]   Note    This format is felt to be most appropriate for this patient at this time.  All issues noted in this document were discussed and addressed.  No physical exam was performed (except for noted visual exam findings with Video Visits).   I connected with  [name] on 05/20/22 at  2:00 PM EDT by a video enabled telemedicine application and verified that I am speaking with the correct person using two identifiers. Location patient: home Location provider: work or home office Persons participating in the virtual visit: patient, provider  I discussed the limitations, risks, security and privacy concerns of performing an evaluation and management service by telephone and the availability of in person appointments. I also discussed with the patient that there may be a patient responsible charge related to this service. The patient expressed understanding and agreed to proceed.   Reason for visit:  groin pain  HPI:  56 yr old male with history of prostatitis presents with suprapubic pain and scrotal tenderness which started 4 days ago.  Denies fever, nausea and diarrhea,  no flank pain.  Also very constipated has seen some [facility] with stools .  He is not sexually active and has had prostatitis in the past and symptoms feel similar to prior episode   ROS: See pertinent positives and negatives per HPI.  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 He has never used smokeless tobacco. He reports that he does not drink alcohol and does not use drugs.    Current Outpatient Medications:    aspirin EC 81 MG tablet, Take 81 mg by mouth daily. Swallow whole., Disp: , Rfl:    atorvastatin (LIPITOR) 20 MG tablet, Take 1 tablet (20 mg total) by mouth daily., Disp: 90 tablet, Rfl: 3   ciprofloxacin (CIPRO) 500 MG tablet, Take 1 tablet (500 mg total) by mouth 2 (two) times daily for 14 days., Disp: 28 tablet, Rfl: 0   DULoxetine (CYMBALTA) 30 MG capsule, TAKE 1 CAPSULE BY MOUTH EVERY DAY, Disp: 90 capsule, Rfl: 2   metFORMIN (GLUCOPHAGE-XR) 500 MG 24 hr tablet, TAKE 1 TABLET BY MOUTH EVERY DAY WITH BREAKFAST, Disp: 90 tablet, Rfl: 1   Multiple Vitamins-Minerals (MULTIVITAMIN WITH MINERALS) tablet, Take 1 tablet by mouth daily., Disp: , Rfl:    oxyCODONE (ROXICODONE) 5 MG immediate release tablet, Take 1 tablet (5 mg total) by mouth at bedtime as needed for severe pain., Disp: 30 tablet, Rfl: 0   telmisartan (MICARDIS) 20 MG tablet, Take 1 tablet (20 mg total) by mouth at bedtime., Disp: 90 tablet, Rfl: 0  EXAM:  VITALS per patient if applicable:  GENERAL: alert, oriented, appears well and in no acute distress  HEENT: atraumatic, conjunttiva clear, no obvious abnormalities on inspection of external nose and ears  NECK: normal movements of the head and neck  LUNGS: on inspection no signs of respiratory distress, breathing rate appears normal, no obvious gross SOB, gasping or wheezing  CV: no obvious cyanosis  MS: moves all visible extremities without noticeable abnormality  PSYCH/NEURO: pleasant and cooperative, no obvious depression or anxiety, speech and thought processing grossly intact  ASSESSMENT AND PLAN:  Discussed the following assessment and plan:  Acute prostatitis without hematuria  Constipation,  unspecified constipation type  Acute prostatitis without hematuria Empiric treatment with cipro 500 mg bid x 14 days.  Patient unable to provide a urine specimen or blood test because of his work schedule, and going out of town for 3 weeks.  Daily use of Probiotics for  4 weeks advised to reduce risk of C dificile colitis.   Constipation Advised to use miralax daily for resolve constipation     I discussed the assessment and treatment plan with the patient. The patient was provided an opportunity to ask questions and all were answered. The patient agreed with the plan and demonstrated an understanding of the instructions.   The patient was advised to call back or seek an in-person evaluation if the symptoms worsen or if the condition fails to improve as anticipated.   I spent 25 minutes dedicated to the care of this patient on the date of this encounter to include pre-visit review of his medical history,  Face-to-face time with the patient , and post visit ordering of testing and therapeutics.    Crecencio Mc, MD

## 2022-05-20 NOTE — Assessment & Plan Note (Addendum)
Empiric treatment with cipro 500 mg bid x 14 days.  Patient unable to provide a urine specimen or blood test because of his work schedule, and going out of town for 3 weeks.  Daily use of Probiotics for  4 weeks advised to reduce risk of C dificile colitis.

## 2022-06-05 ENCOUNTER — Other Ambulatory Visit: Payer: Self-pay | Admitting: Internal Medicine

## 2022-06-06 MED ORDER — OXYCODONE HCL 5 MG PO TABS: 5.0000 mg | ORAL_TABLET | Freq: Every evening | ORAL | 0 refills | Status: DC | PRN

## 2022-06-08 ENCOUNTER — Ambulatory Visit: Payer: Self-pay | Admitting: *Deleted

## 2022-06-08 NOTE — Telephone Encounter (Signed)
  Chief Complaint: Covid Positive Symptoms: Covid positive, tested today. Sinus congestion, subjective fever,fatigued, dry cough Frequency: Symptoms onset Friday Pertinent Negatives: Patient denies SOB, CP Disposition: '[]'$ ED /'[]'$ Urgent Care (no appt availability in office) / '[]'$ Appointment(In office/virtual)/ '[]'$  [facility]/ '[]'$ Home Care/ '[]'$ Refused Recommended Disposition /'[]'$ [facility]/ '[x]'$  Follow-up with PCP Additional Notes: Home care advise provided, self isolation guidelines reviewed. Pt verbalizes understanding. Advised to alert Dr. Derrel Nip, PCP. Pt is high risk. States he will do so. Reason for Disposition  [1] HIGH RISK patient (e.g., weak immune system, age > 39 years, obesity with BMI 30 or higher, pregnant, chronic lung disease or other chronic medical condition) AND [2] COVID symptoms (e.g., cough, fever)  (Exceptions: Already seen by PCP and no new or worsening symptoms.)  Answer Assessment - Initial Assessment Questions 1. COVID-19 DIAGNOSIS: "How do you know that you have COVID?" (e.g., positive lab test or self-test, diagnosed by doctor or NP/PA, symptoms after exposure).     Home Test 2. COVID-19 EXPOSURE: "Was there any known exposure to COVID before the symptoms began?" CDC Definition of close contact: within 6 feet (2 meters) for a total of 15 minutes or more over a 24-hour period.      No 3. ONSET: "When did the COVID-19 symptoms start?"      Friday 4. WORST SYMPTOM: "What is your worst symptom?" (e.g., cough, fever, shortness of breath, muscle aches)     Sinus issues 5. COUGH: "Do you have a cough?" If Yes, ask: "How bad is the cough?"       Cough, dry 6. FEVER: "Do you have a fever?" If Yes, ask: "What is your temperature, how was it measured, and when did it start?"     No, "Maybe low grade" Little warm earlier 7. RESPIRATORY STATUS: "Describe your breathing?" (e.g., normal; shortness of breath, wheezing, unable to speak)      no 8. BETTER-SAME-WORSE:  "Are you getting better, staying the same or getting worse compared to yesterday?"  If getting worse, ask, "In what way?"     Same 9. OTHER SYMPTOMS: "Do you have any other symptoms?"  (e.g., chills, fatigue, headache, loss of smell or taste, muscle pain, sore throat)     Fatigued,sinus pain, stuffiness, runny nose, headache, lower back pain 10. HIGH RISK DISEASE: "Do you have any chronic medical problems?" (e.g., asthma, heart or lung disease, weak immune system, obesity, etc.)        11. VACCINE: "Have you had the COVID-19 vaccine?" If Yes, ask: "Which one, how many shots, when did you get it?"       3 vaccines  Protocols used: Coronavirus (COVID-19) Diagnosed or Suspected-A-AH

## 2022-06-10 ENCOUNTER — Encounter: Payer: Self-pay | Admitting: Internal Medicine

## 2022-06-10 ENCOUNTER — Telehealth (INDEPENDENT_AMBULATORY_CARE_PROVIDER_SITE_OTHER): Payer: BC Managed Care – PPO | Admitting: Internal Medicine

## 2022-06-10 DIAGNOSIS — U071 COVID-19: Secondary | ICD-10-CM | POA: Diagnosis not present

## 2022-06-10 MED ORDER — MOLNUPIRAVIR EUA 200MG CAPSULE: 4.0000 | ORAL_CAPSULE | Freq: Two times a day (BID) | ORAL | 0 refills | Status: AC

## 2022-06-10 MED ORDER — AMOXICILLIN-POT CLAVULANATE 875-125 MG PO TABS: 1.0000 | ORAL_TABLET | Freq: Two times a day (BID) | ORAL | 0 refills | Status: DC

## 2022-06-10 MED ORDER — PREDNISONE 10 MG PO TABS: ORAL_TABLET | ORAL | 0 refills | Status: DC

## 2022-06-10 NOTE — Assessment & Plan Note (Addendum)
POSITIVE TEST ON WEDNESDAY , SYMPTOMS  PRESENT FOR 6 DAYS  WITH SINUS CONGESTION,  PURULENT DRAINAGE . PRESCRIBING MUPIRIOCIN ,  AUGMENTIN AND  PREDNISONE.  RECOMMENDED QUARANTINING UNTIL SUNDAY,  LONGER IF FEBRILE,  THEN MASKING AT HOME   FOR THE REMAINDER OF TEN DAYS

## 2022-06-10 NOTE — Progress Notes (Signed)
Virtual Visit via [place]   Note    This format is felt to be most appropriate for this patient at this time.  All issues noted in this document were discussed and addressed.  No physical exam was performed (except for noted visual exam findings with Video Visits).   I connected with  [name]  on 06/10/22 at  9:00 AM EDT by a video enabled telemedicine application o and verified that I am speaking with the correct person using two identifiers. Location patient: home Location provider: work or home office Persons participating in the virtual visit: patient, provider  I discussed the limitations, risks, security and privacy concerns of performing an evaluation and management service by telephone and the availability of in person appointments. I also discussed with the patient that there may be a patient responsible charge related to this service. The patient expressed understanding and agreed to proceed.  Reason for visit: COVID INFECTION,  SINUS CONGESTION  HPI:   SYMPTOMS OF COUGH ,  SINUS CONGESTION,  GENERALIZED WEAKNESS,  RHINITIS STARTED 6 DAYS AGO  COVID POSITIVE ON WEDNESDAY.  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  I  ROS: See pertinent positives and negatives per HPI.  Past Medical History:  Diagnosis Date   Allergy    Depression    Neuromuscular disorder ([place])    neuropathy of back and bilateral feet   Sleep apnea     Past Surgical History:  Procedure Laterality Date   AMPUTATION     right foot all toes   FOOT SURGERY Right 02/14   cyst removed   SPINE SURGERY  2004   nerve damage in spine   TOE AMPUTATION Right 06/30/2016   5 toes    Family History  Problem Relation Age of Onset   Heart disease Father    Stroke Father    Heart attack Father 65   Heart disease Maternal Grandmother    Stroke Maternal Grandmother    Heart disease Paternal  Grandfather    Stroke Paternal Grandfather     SOCIAL HX:  reports that he has never smoked. He has never used smokeless tobacco. He reports that he does not drink alcohol and does not use drugs.   Current Outpatient Medications:    amoxicillin-clavulanate (AUGMENTIN) 875-125 MG tablet, Take 1 tablet by mouth 2 (two) times daily., Disp: 14 tablet, Rfl: 0   aspirin EC 81 MG tablet, Take 81 mg by mouth daily. Swallow whole., Disp: , Rfl:    atorvastatin (LIPITOR) 20 MG tablet, Take 1 tablet (20 mg total) by mouth daily., Disp: 90 tablet, Rfl: 3   DULoxetine (CYMBALTA) 30 MG capsule, TAKE 1 CAPSULE BY MOUTH EVERY DAY, Disp: 90 capsule, Rfl: 2   metFORMIN (GLUCOPHAGE-XR) 500 MG 24 hr tablet, TAKE 1 TABLET BY MOUTH EVERY DAY WITH BREAKFAST, Disp: 90 tablet, Rfl: 1   molnupiravir EUA (LAGEVRIO) 200 mg CAPS capsule, Take 4 capsules (800 mg total) by mouth 2 (two) times daily for 5 days., Disp: 40 capsule, Rfl: 0   Multiple Vitamins-Minerals (MULTIVITAMIN WITH MINERALS) tablet, Take 1 tablet by mouth daily., Disp: , Rfl:    oxyCODONE (ROXICODONE) 5 MG immediate release tablet, Take 1 tablet (5 mg total) by mouth at bedtime as needed for severe pain., Disp: 30 tablet, Rfl: 0   predniSONE (DELTASONE) 10 MG tablet, 6 tablets daily for  3 days, then reduce by 1 tablet daily until gone, Disp: 33 tablet, Rfl: 0   telmisartan (MICARDIS) 20 MG tablet, Take 1 tablet (20 mg total) by mouth at bedtime., Disp: 90 tablet, Rfl: 0  EXAM:  VITALS per patient if applicable:  GENERAL: alert, oriented, appears well and in no acute distress  HEENT: atraumatic, conjunttiva clear, no obvious abnormalities on inspection of external nose and ears  NECK: normal movements of the head and neck  LUNGS: on inspection no signs of respiratory distress, breathing rate appears normal, no obvious gross SOB, gasping or wheezing  CV: no obvious cyanosis  MS: moves all visible extremities without noticeable  abnormality  PSYCH/NEURO: pleasant and cooperative, no obvious depression or anxiety, speech and thought processing grossly intact  ASSESSMENT AND PLAN:  Discussed the following assessment and plan:  COVID-19  COVID-19 POSITIVE TEST ON WEDNESDAY , SYMPTOMS  PRESENT FOR 6 DAYS  WITH SINUS CONGESTION,  PURULENT DRAINAGE . PRESCRIBING MUPIRIOCIN ,  AUGMENTIN AND  PREDNISONE.  RECOMMENDED QUARANTINING UNTIL SUNDAY,  LONGER IF FEBRILE,  THEN MASKING AT HOME   FOR THE REMAINDER OF TEN DAYS      I discussed the assessment and treatment plan with the patient. The patient was provided an opportunity to ask questions and all were answered. The patient agreed with the plan and demonstrated an understanding of the instructions.   The patient was advised to call back or seek an in-person evaluation if the symptoms worsen or if the condition fails to improve as anticipated.   I spent 20 minutes dedicated to the care of this patient on the date of this encounter to include pre-visit review of his medical history,  Face-to-face time with the patient , and post visit ordering of testing and therapeutics.    Crecencio Mc, MD

## 2022-06-10 NOTE — Patient Instructions (Signed)
YOU CAN BREAK QUARANTINE ON  SUNDAY ( 5 DAYS FROM POSITIVE TEST) BUT CONTINUE ASKING FOR TEN DAYS FROM POSITIVE TEST  MOLNUPIRAVIR for COVID INFECTION  AUGMENTIN AND PREDNISONE FOR SINUSITIS  CONTINUE TYLENOL SINUS/FLU OR MUCINEX D

## 2022-06-14 ENCOUNTER — Encounter: Payer: Self-pay | Admitting: Internal Medicine

## 2022-06-18 ENCOUNTER — Other Ambulatory Visit: Payer: Self-pay | Admitting: Internal Medicine

## 2022-06-29 ENCOUNTER — Encounter: Payer: Self-pay | Admitting: Internal Medicine

## 2022-06-29 ENCOUNTER — Telehealth (INDEPENDENT_AMBULATORY_CARE_PROVIDER_SITE_OTHER): Payer: BC Managed Care – PPO | Admitting: Internal Medicine

## 2022-06-29 DIAGNOSIS — E1169 Type 2 diabetes mellitus with other specified complication: Secondary | ICD-10-CM | POA: Diagnosis not present

## 2022-06-29 DIAGNOSIS — R5383 Other fatigue: Secondary | ICD-10-CM

## 2022-06-29 DIAGNOSIS — U071 COVID-19: Secondary | ICD-10-CM

## 2022-06-29 DIAGNOSIS — E785 Hyperlipidemia, unspecified: Secondary | ICD-10-CM

## 2022-06-29 DIAGNOSIS — G609 Hereditary and idiopathic neuropathy, unspecified: Secondary | ICD-10-CM

## 2022-06-29 NOTE — Progress Notes (Signed)
Virtual Visit via [place]   converted to Telephone Note    This format is felt to be most appropriate for this patient at this time.  All issues noted in this document were discussed and addressed.  No physical exam was performed (except for noted visual exam findings with Video Visits).   I connected with [name]  on 06/29/22 at  8:00 AM EDT by  telephone and verified that I am speaking with the correct person using two identifiers. Location patient: home Location provider: work or home office Persons participating in the virtual visit: patient, provider  I discussed the limitations, risks, security and privacy concerns of performing an evaluation and management service by telephone and the availability of in person appointments. I also discussed with the patient that there may be a patient responsible charge related to this service. The patient expressed understanding and agreed to proceed.  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  1)COVID:  he reports a  Post COVID daily headache described as dull  and located on the crown of the head . (He was treated on Oct 13 virtually while out of town).  The headache is not severe or throbbing,  and he denies dizziness,  vision changes,  neck stiffness and nausea  He completed oral and topical antibiotics/prednisone  for acute sinusitis  on October 21 and sinus symptoms  of purulent blood discharge have all  resolved.   2) follow up on diabetes with neuropathy and microalbuminuria >  he has not been taking telmisartan consistently at night because "it makes me feel weird"  He states that the cymbalta is helping with the pain In his hands, but not with the feet,  so he is continuing to use oxycodone once daily at night prn.  He notes that since his new insurance no longer has weekly communication with him via  [employer] text,  he has been  less strict with his diet. And his blood sugars have been  less controlled.  Sugars are still 120 or lower  Lab Results  Component Value Date   HGBA1C 5.6 02/22/2022      ROS: See pertinent positives and negatives per HPI.  Past Medical History:  Diagnosis Date   Acute prostatitis without hematuria 05/20/2022   Allergy    Depression    Neuromuscular disorder ([facility])    neuropathy of back and bilateral feet   Sleep apnea     Past Surgical History:  Procedure Laterality Date   AMPUTATION     right foot all toes   FOOT SURGERY Right 02/14   cyst removed   SPINE SURGERY  2004   nerve damage in spine   TOE AMPUTATION Right 06/30/2016   5 toes    Family History  Problem Relation Age of Onset   Heart disease Father    Stroke Father    Heart attack Father 27   Heart disease Maternal Grandmother    Stroke Maternal Grandmother    Heart disease Paternal Grandfather    Stroke Paternal Grandfather     SOCIAL HX:  reports that he has never smoked. He has never used smokeless tobacco. He reports that he does not drink alcohol and does not use drugs.    Current Outpatient Medications:    aspirin EC 81 MG tablet, Take 81 mg by mouth daily. Swallow whole., Disp: , Rfl:  atorvastatin (LIPITOR) 20 MG tablet, TAKE 1 TABLET BY MOUTH EVERY DAY, Disp: 90 tablet, Rfl: 3   DULoxetine (CYMBALTA) 30 MG capsule, TAKE 1 CAPSULE BY MOUTH EVERY DAY, Disp: 90 capsule, Rfl: 2   metFORMIN (GLUCOPHAGE-XR) 500 MG 24 hr tablet, TAKE 1 TABLET BY MOUTH EVERY DAY WITH BREAKFAST, Disp: 90 tablet, Rfl: 1   Multiple Vitamins-Minerals (MULTIVITAMIN WITH MINERALS) tablet, Take 1 tablet by mouth daily., Disp: , Rfl:    oxyCODONE (ROXICODONE) 5 MG immediate release tablet, Take 1 tablet (5 mg total) by mouth at bedtime as needed for severe pain., Disp: 30 tablet, Rfl: 0   telmisartan (MICARDIS) 20 MG tablet, Take 1 tablet (20 mg total) by mouth at bedtime., Disp: 90 tablet, Rfl: 0  EXAM:  VITALS per patient  if applicable:  GENERAL: alert, oriented, appears well and in no acute distress  HEENT: atraumatic, conjunttiva clear, no obvious abnormalities on inspection of external nose and ears  NECK: normal movements of the head and neck  LUNGS: on inspection no signs of respiratory distress, breathing rate appears normal, no obvious gross SOB, gasping or wheezing  CV: no obvious cyanosis  MS: moves all visible extremities without noticeable abnormality  PSYCH/NEURO: pleasant and cooperative, no obvious depression or anxiety, speech and thought processing grossly intact  ASSESSMENT AND PLAN:  Discussed the following assessment and plan:  Hyperlipidemia associated with type 2 diabetes mellitus (HCC) - Plan: Hemoglobin A1c, Comp Met (CMET), Lipid panel  Fatigue, unspecified type - Plan: CBC w/Diff  COVID-19  Idiopathic peripheral neuropathy  COVID-19 All symptoms have resovled except for a mild dull headache.  No warning symptoms   Idiopathic peripheral neuropathy Managed with cymbalta (helping the hands ) and once daily oxycodone (for the foot pain).  Refill history confirmed via [place] Controlled Substance databas, accessed unsuccessfully by me today despite numerous attempts .    No changes today   Hyperlipidemia associated with type 2 diabetes mellitus ([place]) Not tolerating telmisartan 20 mg , but has not checked for hypotension. advsied to check BP; will lower dose if  110/70 or lower . a1c needed ,  Continue metformin and atorvastatin     I discussed the assessment and treatment plan with the patient. The patient was provided an opportunity to ask questions and all were answered. The patient agreed with the plan and demonstrated an understanding of the instructions.   The patient was advised to call back or seek an in-person evaluation if the symptoms worsen or if the condition fails to improve as anticipated.   I spent  22 minutes dedicated to the care of this patient on the date of  this encounter to include pre-visit review of his medical history,  non  Face-to-face time with the patient , counselling on management of diabetes,  and post visit ordering of testing and therapeutics.    Crecencio Mc, MD

## 2022-06-29 NOTE — Assessment & Plan Note (Signed)
All symptoms have resovled except for a mild dull headache.  No warning symptoms

## 2022-06-29 NOTE — Assessment & Plan Note (Addendum)
Managed with cymbalta (helping the hands ) and once daily oxycodone (for the foot pain).  Refill history confirmed via [place] Controlled Substance databas, accessed unsuccessfully by me today despite numerous attempts .    No changes today

## 2022-06-29 NOTE — Patient Instructions (Signed)
Take your telmisartan in the morning starting on Satuday  Start checking BP once daily.  If readings are < 110/70 ,  we will reduce telmisartan dose

## 2022-06-29 NOTE — Assessment & Plan Note (Signed)
Not tolerating telmisartan 20 mg , but has not checked for hypotension. advsied to check BP; will lower dose if  110/70 or lower . a1c needed ,  Continue metformin and atorvastatin

## 2022-07-09 ENCOUNTER — Other Ambulatory Visit: Payer: Self-pay | Admitting: Internal Medicine

## 2022-07-10 NOTE — Telephone Encounter (Signed)
LOV 06/29/22

## 2022-07-11 MED ORDER — OXYCODONE HCL 5 MG PO TABS: 5.0000 mg | ORAL_TABLET | Freq: Every evening | ORAL | 0 refills | Status: DC | PRN

## 2022-07-19 ENCOUNTER — Encounter: Payer: Self-pay | Admitting: Internal Medicine

## 2022-07-20 ENCOUNTER — Other Ambulatory Visit: Payer: Self-pay | Admitting: Internal Medicine

## 2022-07-20 DIAGNOSIS — R944 Abnormal results of kidney function studies: Secondary | ICD-10-CM

## 2022-07-20 DIAGNOSIS — N200 Calculus of kidney: Secondary | ICD-10-CM

## 2022-07-20 NOTE — Telephone Encounter (Signed)
See my reply to his mychart message

## 2022-07-25 ENCOUNTER — Other Ambulatory Visit: Payer: Self-pay

## 2022-07-25 DIAGNOSIS — N200 Calculus of kidney: Secondary | ICD-10-CM

## 2022-07-26 ENCOUNTER — Ambulatory Visit
Admission: RE | Admit: 2022-07-26 | Discharge: 2022-07-26 | Disposition: A | Discharge status: Home / Self Care | Payer: BC Managed Care – PPO | Source: Ambulatory Visit | Attending: Urology | Admitting: Urology

## 2022-07-26 ENCOUNTER — Encounter: Payer: Self-pay | Admitting: Urology

## 2022-07-26 ENCOUNTER — Telehealth: Payer: Self-pay

## 2022-07-26 ENCOUNTER — Ambulatory Visit: Payer: BC Managed Care – PPO | Admitting: Urology

## 2022-07-26 ENCOUNTER — Other Ambulatory Visit: Payer: Self-pay | Admitting: Urology

## 2022-07-26 ENCOUNTER — Other Ambulatory Visit: Payer: Self-pay

## 2022-07-26 ENCOUNTER — Ambulatory Visit
Admission: RE | Admit: 2022-07-26 | Discharge: 2022-07-26 | Disposition: A | Discharge status: Home / Self Care | Payer: BC Managed Care – PPO | Attending: Urology | Admitting: Urology

## 2022-07-26 VITALS — BP 157/84 | HR 102 | Ht 70.0 in | Wt 230.0 lb

## 2022-07-26 DIAGNOSIS — N201 Calculus of ureter: Secondary | ICD-10-CM

## 2022-07-26 DIAGNOSIS — R109 Unspecified abdominal pain: Secondary | ICD-10-CM | POA: Diagnosis not present

## 2022-07-26 DIAGNOSIS — N200 Calculus of kidney: Secondary | ICD-10-CM

## 2022-07-26 NOTE — Telephone Encounter (Signed)
LMTCB

## 2022-07-26 NOTE — Patient Instructions (Signed)
Laser Therapy for Kidney Stones Laser therapy for kidney stones is a procedure to break up small, hard mineral deposits that form in the kidney (kidney stones). The procedure is done using a device that produces a focused beam of light (laser). The laser breaks up kidney stones into pieces that are small enough to be passed out of the body through urination or removed from the body during the procedure. You may need laser therapy if you have kidney stones that are painful or block your urinary tract. This procedure is done by inserting a tube (ureteroscope) into your kidney through the urethral opening. The urethra is the part of the body that drains urine from the bladder. In women, the urethra opens above the vaginal opening. In men, the urethra opens at the tip of the penis. The ureteroscope is inserted through the urethra, and surgical instruments are moved through the bladder and the muscular tube that connects the kidney to the bladder (ureter) until they reach the kidney. Tell a health care provider about: Any allergies you have. All medicines you are taking, including vitamins, herbs, eye drops, creams, and over-the-counter medicines. Any problems you or family members have had with anesthetic medicines. Any blood disorders you have. Any surgeries you have had. Any medical conditions you have. Whether you are pregnant or may be pregnant. What are the risks? Generally, this is a safe procedure. However, problems may occur, including: Infection. Bleeding. Allergic reactions to medicines. Damage to the urethra, bladder, or ureter. Urinary tract infection (UTI). Narrowing of the urethra (urethral stricture). Difficulty passing urine. Blockage of the kidney caused by a fragment of kidney stone. What happens before the procedure? Medicines Ask your health care provider about: Changing or stopping your regular medicines. This is especially important if you are taking diabetes medicines or  blood thinners. Taking medicines such as aspirin and ibuprofen. These medicines can thin your blood. Do not take these medicines unless your health care provider tells you to take them. Taking over-the-counter medicines, vitamins, herbs, and supplements. Eating and drinking Follow instructions from your health care provider about eating and drinking, which may include: 8 hours before the procedure - stop eating heavy meals or foods, such as meat, fried foods, or fatty foods. 6 hours before the procedure - stop eating light meals or foods, such as toast or cereal. 6 hours before the procedure - stop drinking milk or drinks that contain milk. 2 hours before the procedure - stop drinking clear liquids. Staying hydrated Follow instructions from your health care provider about hydration, which may include: Up to 2 hours before the procedure - you may continue to drink clear liquids, such as water, clear fruit juice, black coffee, and plain tea.  General instructions You may have a physical exam before the procedure. You may also have tests, such as imaging tests and blood or urine tests. If your ureter is too narrow, your health care provider may place a soft, flexible tube (stent) inside of it. The stent may be placed days or weeks before your laser therapy procedure. Plan to have someone take you home from the hospital or clinic. If you will be going home right after the procedure, plan to have someone stay with you for 24 hours. Do not use any products that contain nicotine or tobacco for at least 4 weeks before the procedure. These products include cigarettes, e-cigarettes, and chewing tobacco. If you need help quitting, ask your health care provider. Ask your health care provider: How your   surgical site will be marked or identified. What steps will be taken to help prevent infection. These may include: Removing hair at the surgery site. Washing skin with a germ-killing soap. Taking antibiotic  medicine. What happens during the procedure?  An IV will be inserted into one of your veins. You will be given one or more of the following: A medicine to help you relax (sedative). A medicine to numb the area (local anesthetic). A medicine to make you fall asleep (general anesthetic). A ureteroscope will be inserted into your urethra. The ureteroscope will send images to a video screen in the operating room to guide your surgeon to the area of your kidney that will be treated. A small, flexible tube will be threaded through the ureteroscope and into your bladder and ureter, up to your kidney. The laser device will be inserted into your kidney through the tube. Your surgeon will pulse the laser on and off to break up kidney stones. A surgical instrument that has a tiny wire basket may be inserted through the tube into your kidney to remove the pieces of broken kidney stone. The procedure may vary among health care providers and hospitals. What happens after the procedure? Your blood pressure, heart rate, breathing rate, and blood oxygen level will be monitored until you leave the hospital or clinic. You will be given pain medicine as needed. You may continue to receive antibiotics. You may have a stent temporarily placed in your ureter. Do not drive for 24 hours if you were given a sedative during your procedure. You may be given a strainer to collect any stone fragments that you pass in your urine. Your health care provider may have these tested. This information is not intended to replace advice given to you by your health care provider. Make sure you discuss any questions you have with your health care provider. Document Revised: 12/22/2021 Document Reviewed: 04/19/2021 Elsevier Patient Education  [place].  Dietary Guidelines to Help Prevent Kidney Stones Kidney stones are deposits of minerals and salts that form inside your kidneys. Your risk of developing kidney stones may be  greater depending on your diet, your lifestyle, the medicines you take, and whether you have certain medical conditions. Most people can lower their risks of developing kidney stones by following these dietary guidelines. Your dietitian may give you more specific instructions depending on your overall health and the type of kidney stones you tend to develop. What are tips for following this plan? Reading food labels  Choose foods with "no salt added" or "low-salt" labels. Limit your salt (sodium) intake to less than 1,500 mg a day. Choose foods with calcium for each meal and snack. Try to eat about 300 mg of calcium at each meal. Foods that contain 200-500 mg of calcium a serving include: 8 oz (237 mL) of milk, calcium-fortifiednon-dairy milk, and calcium-fortifiedfruit juice. Calcium-fortified means that calcium has been added to these drinks. 8 oz (237 mL) of kefir, yogurt, and soy yogurt. 4 oz (114 g) of tofu. 1 oz (28 g) of cheese. 1 cup (150 g) of dried figs. 1 cup (91 g) of cooked broccoli. One 3 oz (85 g) can of sardines or mackerel. Most people need 1,000-1,500 mg of calcium a day. Talk to your dietitian about how much calcium is recommended for you. Shopping Buy plenty of fresh fruits and vegetables. Most people do not need to avoid fruits and vegetables, even if these foods contain nutrients that may contribute to kidney stones.  When shopping for convenience foods, choose: Whole pieces of fruit. Pre-made salads with dressing on the side. Low-fat fruit and yogurt smoothies. Avoid buying frozen meals or prepared deli foods. These can be high in sodium. Look for foods with live cultures, such as yogurt and kefir. Choose high-fiber grains, such as whole-wheat breads, oat bran, and wheat cereals. Cooking Do not add salt to food when cooking. Place a salt shaker on the table and allow each person to add their own salt to taste. Use vegetable protein, such as beans, textured vegetable  protein (TVP), or tofu, instead of meat in pasta, casseroles, and soups. Meal planning Eat less salt, if told by your dietitian. To do this: Avoid eating processed or pre-made food. Avoid eating fast food. Eat less animal protein, including cheese, meat, poultry, or fish, if told by your dietitian. To do this: Limit the number of times you have meat, poultry, fish, or cheese each week. Eat a diet free of meat at least 2 days a week. Eat only one serving each day of meat, poultry, fish, or seafood. When you prepare animal proteins, cut pieces into small portion sizes. For most meat and fish, one serving is about the size of the palm of your hand. Eat at least five servings of fresh fruits and vegetables each day. To do this: Keep fruits and vegetables on hand for snacks. Eat one piece of fruit or a handful of berries with breakfast. Have a salad and fruit at lunch. Have two kinds of vegetables at dinner. You may be told to limit foods that are high in a substance called oxalate. These include: Spinach (cooked), rhubarb, beets, sweet potatoes, and Swiss chard. Peanuts. Potato chips, french fries, and baked potatoes with skin on. Nuts and nut products. Chocolate. If you regularly take a diuretic medicine, make sure to eat at least 1 or 2 servings of fruits or vegetables that are high in potassium each day. These include: Avocado. Banana. Orange, prune, carrot, or tomato juice. Baked potato. Cabbage. Beans and split peas. Lifestyle  Drink enough fluid to keep your urine pale yellow. This is the most important thing you can do. Spread your fluid intake throughout the day. If you drink alcohol: Limit how much you have to: 0-1 drink a day for women who are not pregnant. 0-2 drinks a day for men. Know how much alcohol is in your drink. In the [place] one drink equals one 12 oz bottle of beer (355 mL), one 5 oz glass of wine (148 mL), or one 1 oz glass of hard liquor (44 mL). Lose weight if  told by your health care provider. Work with your dietitian to find an eating plan and weight loss strategies that work best for you. General information Talk to your health care provider and dietitian about taking daily supplements. Depending on your health and the cause of your kidney stones, you may be told: Do not take high-dose supplements of vitamin C (1,000 mg a day or more). To take a calcium supplement. To take a daily probiotic supplement. To take other supplements such as magnesium, fish oil, or vitamin B6. Take over-the-counter and prescription medicines only as told by your health care provider. These include supplements. What foods should I limit? Limit your intake of the following foods, or eat them as told by your dietitian. Vegetables Spinach. Rhubarb. Beets. Canned vegetables. Angie Fava. Olives. Baked potatoes with skin. Grains Wheat bran. Baked goods. Salted crackers. Cereals high in sugar. Meats and other proteins  Nuts. Nut butters. Large portions of meat, poultry, or fish. Salted, precooked, or cured meats, such as sausages, meat loaves, and hot dogs. Dairy Cheeses. Beverages Regular soft drinks. Regular vegetable juice. Seasonings and condiments Seasoning blends with salt. Salad dressings. Soy sauce. Ketchup. Barbecue sauce. Other foods Canned soups. Canned pasta sauce. Casseroles. Pizza. Lasagna. Frozen meals. Potato chips. [place] fries. The items listed above may not be a complete list of foods and beverages you should limit. Contact a dietitian for more information. What foods should I avoid? Talk to your dietitian about specific foods you should avoid based on the type of kidney stones you have and your overall health. Fruits Grapefruit. The item listed above may not be a complete list of foods and beverages you should avoid. Contact a dietitian for more information. Summary Kidney stones are deposits of minerals and salts that form inside your kidneys. You can  lower your risk of kidney stones by making changes to your diet. The most important thing you can do is drink enough fluid. Drink enough fluid to keep your urine pale yellow. Talk to your dietitian about how much calcium you should have each day, and eat less salt and animal protein as told by your dietitian. This information is not intended to replace advice given to you by your health care provider. Make sure you discuss any questions you have with your health care provider. Document Revised: 11/25/2021 Document Reviewed: 11/25/2021 Elsevier Patient Education  [place].

## 2022-07-26 NOTE — H&P (View-Only) (Signed)
07/26/22 12:31 PM   [name] 1966-01-10 [id number]  CC: Nephrolithiasis  HPI: 56 year old male with about 2 weeks of right-sided flank and groin pain.  CT abdomen and pelvis at [facility] on 07/17/2022 showed a 6 mm right mid ureteral stone in addition to a 6 mm left lower pole nonobstructing stone.  Urinalysis was noninfected, culture was negative, and he was discharged with medical expulsive therapy.  He continues to have right-sided groin and lower abdominal pain.  He denies any fevers, chills, or urinary symptoms.  No prior history of kidney stones.  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 He has never been exposed to tobacco smoke. He has never used smokeless tobacco. He reports that he does not drink alcohol and does not use drugs.  Physical Exam: BP (!) 157/84   Pulse (!) 102   Ht '5\' 10"'$  (1.778 m)   Wt 230 lb (104.3 kg)   BMI 33.00 kg/m    Constitutional:  Alert and oriented, No acute distress. Cardiovascular: No clubbing, cyanosis, or edema. Respiratory: Normal respiratory effort, no increased work of breathing. GI: Abdomen is soft, nontender, nondistended, no abdominal  masses   Laboratory Data: Reviewed, urine culture no growth, urine pH 5  Pertinent Imaging: I have personally viewed and interpreted the CT abdomen pelvis from [facility] dated 07/17/2022.  6 mm stone in the right mid ureter with upstream hydronephrosis, left 6 mm lower pole nonobstructing stone.  Stone measures 400HU, and in the setting of urine pH of 5 stones likely uric acid.  Unable to see the right ureteral stone on KUB today  Assessment & Plan:   56 year old male with 2 weeks of right-sided flank pain secondary to a 6 mm right mid ureteral stone.  He is interested in definitive management at this point with ongoing flank pain requiring narcotics.  Stone appears to be uric acid based on HU<500 and urine pH 5  We discussed various treatment options for urolithiasis including observation with or without medical expulsive therapy, shockwave lithotripsy (SWL), ureteroscopy and laser lithotripsy with stent placement, and percutaneous nephrolithotomy.  We discussed that management is based on stone size, location, density, patient co-morbidities, and patient preference.   Stones <84m in size have a >80% spontaneous passage rate. Data surrounding the use of tamsulosin for medical expulsive therapy is controversial, but meta analyses suggests it is most efficacious for distal stones between 5-158min size. Possible side effects include dizziness/lightheadedness, and retrograde ejaculation.  SWL has a lower stone free rate in a  single procedure, but also a lower complication rate compared to ureteroscopy and avoids a stent and associated stent related symptoms. Possible complications include renal hematoma, steinstrasse, and need for additional treatment.  Shockwave not a good option with his uric acid stone as well as more distal location.  Ureteroscopy with laser lithotripsy and stent placement has a higher stone free rate than SWL in a single procedure, however increased complication rate including  possible infection, ureteral injury, bleeding, and stent related morbidity. Common stent related symptoms include dysuria, urgency/frequency, and flank pain.  After an extensive discussion of the risks and benefits of the above treatment options, the patient would like to proceed with bilateral ureteroscopy, laser lithotripsy, stent placement.   Nickolas Madrid, MD 07/26/2022  [facility] [address] [place],  [address] [phone]

## 2022-07-26 NOTE — Telephone Encounter (Signed)
I spoke with [name]. We have discussed possible surgery dates and Friday December 1st, 2023 was agreed upon by all parties. Patient given information about surgery date, what to expect pre-operatively and post operatively.  We discussed that a Pre-Admission Testing office will be calling to set up the pre-op visit that will take place prior to surgery, and that these appointments are typically done over the phone with a Pre-Admissions RN.  Informed patient that our office will communicate any additional care to be provided after surgery. Patients questions or concerns were discussed during our call. Advised to call our office should there be any additional information, questions or concerns that arise. Patient verbalized understanding.

## 2022-07-26 NOTE — Progress Notes (Signed)
   [facility]  Surgery Date: Date: 07/29/2022  Surgeon: Dr. Nickolas Madrid, MD  Inpt ( No  )   Outpt (Yes)   Obs ( No  )   Diagnosis: N20.1 Right Ureteral Stone, N20.0 Left Renal Stone  -CPT: [phone]  Surgery: Bilateral Ureteroscopy with laser lithotripsy and stent placement  Stop Anticoagulations: No, may continue all  Cardiac/Medical/Pulmonary Clearance needed: no  *Orders entered into EPIC  Date: 07/26/22   *Case booked in EPIC  Date: 07/26/22  *Notified pt of Surgery: Date: 07/26/22  PRE-OP UA & CX: no  *Placed into Prior Authorization Work Que Date: 07/26/22  Assistant/laser/rep:No

## 2022-07-26 NOTE — Progress Notes (Signed)
07/26/22 12:31 PM   WACEY ZIEGER [birth year] [id number]  CC: Nephrolithiasis  HPI: 56 year old male with about 2 weeks of right-sided flank and groin pain.  CT abdomen and pelvis at [facility] on 07/17/2022 showed a 6 mm right mid ureteral stone in addition to a 6 mm left lower pole nonobstructing stone.  Urinalysis was noninfected, culture was negative, and he was discharged with medical expulsive therapy.  He continues to have right-sided groin and lower abdominal pain.  He denies any fevers, chills, or urinary symptoms.  No prior history of kidney stones.  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 He has never been exposed to tobacco smoke. He has never used smokeless tobacco. He reports that he does not drink alcohol and does not use drugs.  Physical Exam: BP (!) 157/84   Pulse (!) 102   Ht '5\' 10"'$  (1.778 m)   Wt 230 lb (104.3 kg)   BMI 33.00 kg/m    Constitutional:  Alert and oriented, No acute distress. Cardiovascular: No clubbing, cyanosis, or edema. Respiratory: Normal respiratory effort, no increased work of breathing. GI: Abdomen is soft, nontender, nondistended, no abdominal  masses   Laboratory Data: Reviewed, urine culture no growth, urine pH 5  Pertinent Imaging: I have personally viewed and interpreted the CT abdomen pelvis from [facility] dated 07/17/2022.  6 mm stone in the right mid ureter with upstream hydronephrosis, left 6 mm lower pole nonobstructing stone.  Stone measures 400HU, and in the setting of urine pH of 5 stones likely uric acid.  Unable to see the right ureteral stone on KUB today  Assessment & Plan:   56 year old male with 2 weeks of right-sided flank pain secondary to a 6 mm right mid ureteral stone.  He is interested in definitive management at this point with ongoing flank pain requiring narcotics.  Stone appears to be uric acid based on HU<500 and urine pH 5  We discussed various treatment options for urolithiasis including observation with or without medical expulsive therapy, shockwave lithotripsy (SWL), ureteroscopy and laser lithotripsy with stent placement, and percutaneous nephrolithotomy.  We discussed that management is based on stone size, location, density, patient co-morbidities, and patient preference.   Stones <89m in size have a >80% spontaneous passage rate. Data surrounding the use of tamsulosin for medical expulsive therapy is controversial, but meta analyses suggests it is most efficacious for distal stones between 5-156min size. Possible side effects include dizziness/lightheadedness, and retrograde ejaculation.  SWL has a lower stone free rate in a  single procedure, but also a lower complication rate compared to ureteroscopy and avoids a stent and associated stent related symptoms. Possible complications include renal hematoma, steinstrasse, and need for additional treatment.  Shockwave not a good option with his uric acid stone as well as more distal location.  Ureteroscopy with laser lithotripsy and stent placement has a higher stone free rate than SWL in a single procedure, however increased complication rate including  possible infection, ureteral injury, bleeding, and stent related morbidity. Common stent related symptoms include dysuria, urgency/frequency, and flank pain.  After an extensive discussion of the risks and benefits of the above treatment options, the patient would like to proceed with bilateral ureteroscopy, laser lithotripsy, stent placement.   Nickolas Madrid, MD 07/26/2022  [facility] [address] [place] [employer] [place] [employer], [place] [address] [phone]

## 2022-07-26 NOTE — Progress Notes (Signed)
Surgical Physician Order Form [facility]  * Scheduling expectation :  Friday 12/1  *Length of Case: 1 hour  *Clearance needed: no  *Anticoagulation Instructions: May continue all anticoagulants  *Aspirin Instructions: Ok to continue all  *Post-op visit Date/Instructions:   tbd  *Diagnosis: Right Ureteral Stone, left renal stone  *Procedure: bilateral Ureteroscopy w/laser lithotripsy & stent placement ([id number])   Additional orders: N/A  -Admit type: OUTpatient  -Anesthesia: General  -VTE Prophylaxis Standing Order SCD's       Other:   -Standing Lab Orders Per Anesthesia    Lab other: None  -Standing Test orders EKG/Chest x-ray per Anesthesia       Test other:   - Medications:  Ancef 2gm IV  -Other orders:  N/A

## 2022-07-28 ENCOUNTER — Encounter
Admission: RE | Admit: 2022-07-28 | Discharge: 2022-07-28 | Disposition: A | Discharge status: Home / Self Care | Payer: BC Managed Care – PPO | Source: Ambulatory Visit | Attending: Urology | Admitting: Urology

## 2022-07-28 DIAGNOSIS — Z01818 Encounter for other preprocedural examination: Secondary | ICD-10-CM

## 2022-07-28 DIAGNOSIS — E119 Type 2 diabetes mellitus without complications: Secondary | ICD-10-CM

## 2022-07-28 DIAGNOSIS — Z01810 Encounter for preprocedural cardiovascular examination: Secondary | ICD-10-CM

## 2022-07-28 HISTORY — DX: Type 2 diabetes mellitus without complications: E11.9

## 2022-07-28 HISTORY — DX: Personal history of urinary calculi: Z87.442

## 2022-07-28 HISTORY — DX: Other complications of anesthesia, initial encounter: T88.59XA

## 2022-07-28 HISTORY — DX: Type 2 diabetes mellitus with foot ulcer: E11.621

## 2022-07-28 HISTORY — DX: Type 2 diabetes mellitus with foot ulcer: L97.519

## 2022-07-28 HISTORY — DX: Obesity, unspecified: E66.9

## 2022-07-28 HISTORY — DX: COVID-19: U07.1

## 2022-07-28 NOTE — Patient Instructions (Signed)
Your procedure is scheduled on:07-29-22 Friday Report to the Registration Desk on the 1st floor of the Door.Then proceed to the 2nd floor Surgery Desk To find out your arrival time, please call [phone] between 1PM - 3PM on:07-28-22 Thursday If your arrival time is 6:00 am, do not arrive prior to that time as the [facility] entrance doors do not open until 6:00 am.  REMEMBER: Instructions that are not followed completely may result in serious medical risk, up to and including death; or upon the discretion of your surgeon and anesthesiologist your surgery may need to be rescheduled.  Do not eat food OR drink any liquids after midnight the night before surgery.  No gum chewing, lozengers or hard candies.  TAKE THESE MEDICATIONS THE MORNING OF SURGERY WITH A SIP OF WATER: -atorvastatin (LIPITOR)  -DULoxetine (CYMBALTA)  -tamsulosin (FLOMAX)   Continue your 81 mg Aspirin-Do NOT take the morning of surgery  Last dose of metFORMIN (GLUCOPHAGE-XR) was 07-28-22 Thursday  One week prior to surgery: Stop Anti-inflammatories (NSAIDS) such as Advil, Aleve, Ibuprofen, Motrin, Naproxen, Naprosyn and Aspirin based products such as Excedrin, Goodys Powder, BC Powder. You may however, continue to take Tylenol if needed for pain up until the day of surgery.  No Alcohol for 24 hours before or after surgery.  No Smoking including e-cigarettes for 24 hours prior to surgery.  No chewable tobacco products for at least 6 hours prior to surgery.  No nicotine patches on the day of surgery.  Do not use any "recreational" drugs for at least a week prior to your surgery.  Please be advised that the combination of cocaine and anesthesia may have negative outcomes, up to and including death. If you test positive for cocaine, your surgery will be cancelled.  On the morning of surgery brush your teeth with toothpaste and water, you may rinse your mouth with mouthwash if you wish. Do not swallow any  toothpaste or mouthwash.  Do not wear jewelry, make-up, hairpins, clips or nail polish.  Do not wear lotions, powders, or perfumes.   Do not shave body from the neck down 48 hours prior to surgery just in case you cut yourself which could leave a site for infection.  Also, freshly shaved skin may become irritated if using the CHG soap.  Contact lenses, hearing aids and dentures may not be worn into surgery.  Do not bring valuables to the hospital. [facility] is not responsible for any missing/lost belongings or valuables.   Notify your doctor if there is any change in your medical condition (cold, fever, infection).  Wear comfortable clothing (specific to your surgery type) to the hospital.  After surgery, you can help prevent lung complications by doing breathing exercises.  Take deep breaths and cough every 1-2 hours. Your doctor may order a device called an Incentive Spirometer to help you take deep breaths. When coughing or sneezing, hold a pillow firmly against your incision with both hands. This is called "splinting." Doing this helps protect your incision. It also decreases belly discomfort.  If you are being admitted to the hospital overnight, leave your suitcase in the car. After surgery it may be brought to your room.  If you are being discharged the day of surgery, you will not be allowed to drive home. You will need a responsible adult (18 years or older) to drive you home and stay with you that night.   If you are taking public transportation, you will need to have a  responsible adult (18 years or older) with you. Please confirm with your physician that it is acceptable to use public transportation.   Please call the [place] Dept. at [phone] if you have any questions about these instructions.  Surgery Visitation Policy:  Patients undergoing a surgery or procedure may have two family members or support persons with them as long as the person is not  COVID-19 positive or experiencing its symptoms.   MASKING: Due to an increase in RSV rates and hospitalizations, in-patient care areas in which we serve newborns, infants and children, masks will be required for teammates and visitors.  Children ages 57 and under may not visit. This policy affects the following departments only:  [place] Postpartum area Mother Baby Unit Newborn nursery/Special care nursery  Other areas: Masks continue to be strongly recommended for [facility] teammates, visitors and patients in all other areas. Visitation is not restricted outside of the units listed above.

## 2022-07-29 ENCOUNTER — Encounter
Admission: RE | Disposition: A | Discharge status: Home / Self Care | Payer: Self-pay | Source: Home / Self Care | Attending: Urology

## 2022-07-29 ENCOUNTER — Ambulatory Visit: Payer: BC Managed Care – PPO | Admitting: Urgent Care

## 2022-07-29 ENCOUNTER — Encounter: Payer: Self-pay | Admitting: Urology

## 2022-07-29 ENCOUNTER — Other Ambulatory Visit: Payer: Self-pay

## 2022-07-29 ENCOUNTER — Ambulatory Visit: Payer: BC Managed Care – PPO

## 2022-07-29 ENCOUNTER — Ambulatory Visit
Admission: RE | Admit: 2022-07-29 | Discharge: 2022-07-29 | Disposition: A | Discharge status: Home / Self Care | Payer: BC Managed Care – PPO | Attending: Urology | Admitting: Urology

## 2022-07-29 DIAGNOSIS — Z79891 Long term (current) use of opiate analgesic: Secondary | ICD-10-CM | POA: Insufficient documentation

## 2022-07-29 DIAGNOSIS — N201 Calculus of ureter: Secondary | ICD-10-CM

## 2022-07-29 DIAGNOSIS — G629 Polyneuropathy, unspecified: Secondary | ICD-10-CM | POA: Insufficient documentation

## 2022-07-29 DIAGNOSIS — E119 Type 2 diabetes mellitus without complications: Secondary | ICD-10-CM

## 2022-07-29 DIAGNOSIS — Z01818 Encounter for other preprocedural examination: Secondary | ICD-10-CM

## 2022-07-29 DIAGNOSIS — Z01810 Encounter for preprocedural cardiovascular examination: Secondary | ICD-10-CM

## 2022-07-29 DIAGNOSIS — N202 Calculus of kidney with calculus of ureter: Secondary | ICD-10-CM | POA: Diagnosis present

## 2022-07-29 DIAGNOSIS — N200 Calculus of kidney: Secondary | ICD-10-CM

## 2022-07-29 HISTORY — PX: CYSTOSCOPY/URETEROSCOPY/HOLMIUM LASER/STENT PLACEMENT: SHX6546

## 2022-07-29 LAB — GLUCOSE, CAPILLARY
Glucose-Capillary: 165 mg/dL — ABNORMAL HIGH (ref 70–99)
Glucose-Capillary: 139 mg/dL — ABNORMAL HIGH (ref 70–99)

## 2022-07-29 SURGERY — CYSTOSCOPY/URETEROSCOPY/HOLMIUM LASER/STENT PLACEMENT
Anesthesia: General | Site: Ureter | Laterality: Right

## 2022-07-29 MED ORDER — FAMOTIDINE 20 MG PO TABS: 20.0000 mg | ORAL_TABLET | Freq: Once | ORAL | Status: AC

## 2022-07-29 MED ORDER — SUCCINYLCHOLINE CHLORIDE 200 MG/10ML IV SOSY
PREFILLED_SYRINGE | INTRAVENOUS | Status: DC | PRN
  Administered 2022-07-29: 100 mg via INTRAVENOUS

## 2022-07-29 MED ORDER — FENTANYL CITRATE (PF) 100 MCG/2ML IJ SOLN
INTRAMUSCULAR | Status: DC | PRN
  Administered 2022-07-29: 50 ug via INTRAVENOUS

## 2022-07-29 MED ORDER — ACETAMINOPHEN 10 MG/ML IV SOLN
INTRAVENOUS | Status: AC
  Filled 2022-07-29: qty 100

## 2022-07-29 MED ORDER — CEFAZOLIN SODIUM-DEXTROSE 2-4 GM/100ML-% IV SOLN
2.0000 g | INTRAVENOUS | Status: AC
  Administered 2022-07-29: 2 g via INTRAVENOUS

## 2022-07-29 MED ORDER — ACETAMINOPHEN 10 MG/ML IV SOLN
INTRAVENOUS | Status: DC | PRN
  Administered 2022-07-29: 1000 mg via INTRAVENOUS

## 2022-07-29 MED ORDER — ROCURONIUM BROMIDE 100 MG/10ML IV SOLN
INTRAVENOUS | Status: DC | PRN
  Administered 2022-07-29: 10 mg via INTRAVENOUS
  Administered 2022-07-29: 50 mg via INTRAVENOUS

## 2022-07-29 MED ORDER — FENTANYL CITRATE (PF) 100 MCG/2ML IJ SOLN
INTRAMUSCULAR | Status: AC
  Filled 2022-07-29: qty 2

## 2022-07-29 MED ORDER — SODIUM CHLORIDE 0.9 % IR SOLN
Status: DC | PRN
  Administered 2022-07-29: 3000 mL via INTRAVESICAL

## 2022-07-29 MED ORDER — CEFAZOLIN SODIUM-DEXTROSE 2-4 GM/100ML-% IV SOLN
INTRAVENOUS | Status: AC
  Filled 2022-07-29: qty 100

## 2022-07-29 MED ORDER — CHLORHEXIDINE GLUCONATE 0.12 % MT SOLN
OROMUCOSAL | Status: AC
  Administered 2022-07-29: 15 mL
  Filled 2022-07-29: qty 15

## 2022-07-29 MED ORDER — PROPOFOL 10 MG/ML IV BOLUS
INTRAVENOUS | Status: DC | PRN
  Administered 2022-07-29: 200 mg via INTRAVENOUS

## 2022-07-29 MED ORDER — HYDROCODONE-ACETAMINOPHEN 5-325 MG PO TABS: 1.0000 | ORAL_TABLET | Freq: Four times a day (QID) | ORAL | 0 refills | Status: AC | PRN

## 2022-07-29 MED ORDER — CHLORHEXIDINE GLUCONATE 0.12 % MT SOLN: 15.0000 mL | Freq: Once | OROMUCOSAL | Status: AC

## 2022-07-29 MED ORDER — GLYCOPYRROLATE 0.2 MG/ML IJ SOLN
INTRAMUSCULAR | Status: DC | PRN
  Administered 2022-07-29: .2 mg via INTRAVENOUS

## 2022-07-29 MED ORDER — SODIUM CHLORIDE 0.9 % IV SOLN: INTRAVENOUS | Status: DC

## 2022-07-29 MED ORDER — ONDANSETRON HCL 4 MG/2ML IJ SOLN
INTRAMUSCULAR | Status: DC | PRN
  Administered 2022-07-29: 4 mg via INTRAVENOUS

## 2022-07-29 MED ORDER — LIDOCAINE HCL (CARDIAC) PF 100 MG/5ML IV SOSY
PREFILLED_SYRINGE | INTRAVENOUS | Status: DC | PRN
  Administered 2022-07-29: 100 mg via INTRAVENOUS

## 2022-07-29 MED ORDER — IOHEXOL 180 MG/ML  SOLN
INTRAMUSCULAR | Status: DC | PRN
  Administered 2022-07-29: 10 mL

## 2022-07-29 MED ORDER — SUGAMMADEX SODIUM 500 MG/5ML IV SOLN
INTRAVENOUS | Status: DC | PRN
  Administered 2022-07-29: 200 mg via INTRAVENOUS

## 2022-07-29 MED ORDER — KETOROLAC TROMETHAMINE 30 MG/ML IJ SOLN
INTRAMUSCULAR | Status: AC
  Filled 2022-07-29: qty 1

## 2022-07-29 MED ORDER — DEXAMETHASONE SODIUM PHOSPHATE 10 MG/ML IJ SOLN
INTRAMUSCULAR | Status: DC | PRN
  Administered 2022-07-29: 4 mg via INTRAVENOUS

## 2022-07-29 MED ORDER — KETOROLAC TROMETHAMINE 15 MG/ML IJ SOLN
INTRAMUSCULAR | Status: DC | PRN
  Administered 2022-07-29: 15 mg via INTRAVENOUS

## 2022-07-29 MED ORDER — FAMOTIDINE 20 MG PO TABS
ORAL_TABLET | ORAL | Status: AC
  Administered 2022-07-29: 20 mg via ORAL
  Filled 2022-07-29: qty 1

## 2022-07-29 MED ORDER — MIDAZOLAM HCL 2 MG/2ML IJ SOLN
INTRAMUSCULAR | Status: AC
  Filled 2022-07-29: qty 2

## 2022-07-29 MED ORDER — ORAL CARE MOUTH RINSE: 15.0000 mL | Freq: Once | OROMUCOSAL | Status: AC

## 2022-07-29 SURGICAL SUPPLY — 34 items
ADH LQ OCL WTPRF AMP STRL LF (MISCELLANEOUS)
ADHESIVE MASTISOL STRL (MISCELLANEOUS) IMPLANT
BAG DRAIN SIEMENS DORNER NS (MISCELLANEOUS) ×1 IMPLANT
BAG DRN NS LF (MISCELLANEOUS) ×1
BAG PRESSURE INF REUSE 3000 (BAG) ×1 IMPLANT
BASKET NITINOL 4 WIRE 16 (BASKET) IMPLANT
BRUSH SCRUB EZ 1% IODOPHOR (MISCELLANEOUS) ×1 IMPLANT
BSKT STON RTRVL 120X16 3FR (BASKET) ×1
CATH URET FLEX-TIP 2 LUMEN 10F (CATHETERS) IMPLANT
CATH URETL OPEN 5X70 (CATHETERS) IMPLANT
CNTNR SPEC 2.5X3XGRAD LEK (MISCELLANEOUS)
CONT SPEC 4OZ STRL OR WHT (MISCELLANEOUS)
CONTAINER SPEC 2.5X3XGRAD LEK (MISCELLANEOUS) IMPLANT
DRAPE UTILITY 15X26 TOWEL STRL (DRAPES) ×1 IMPLANT
DRSG TEGADERM 2-3/8X2-3/4 SM (GAUZE/BANDAGES/DRESSINGS) IMPLANT
FIBER LASER MOSES 200 DFL (Laser) IMPLANT
GLOVE SURG UNDER POLY LF SZ7.5 (GLOVE) ×1 IMPLANT
GOWN STRL REUS W/ TWL LRG LVL3 (GOWN DISPOSABLE) ×1 IMPLANT
GOWN STRL REUS W/ TWL XL LVL3 (GOWN DISPOSABLE) ×1 IMPLANT
GOWN STRL REUS W/TWL LRG LVL3 (GOWN DISPOSABLE) ×1
GOWN STRL REUS W/TWL XL LVL3 (GOWN DISPOSABLE) ×1
GUIDEWIRE STR DUAL SENSOR (WIRE) ×1 IMPLANT
IV NS IRRIG 3000ML ARTHROMATIC (IV SOLUTION) ×1 IMPLANT
KIT TURNOVER CYSTO (KITS) ×1 IMPLANT
PACK CYSTO AR (MISCELLANEOUS) ×1 IMPLANT
SET CYSTO W/LG BORE CLAMP LF (SET/KITS/TRAYS/PACK) ×1 IMPLANT
SHEATH NAVIGATOR HD 12/14X36 (SHEATH) IMPLANT
STENT URET 6FRX24 CONTOUR (STENTS) IMPLANT
STENT URET 6FRX26 CONTOUR (STENTS) IMPLANT
STENT URET 6FRX28 CONTOUR (STENTS) IMPLANT
SURGILUBE 2OZ TUBE FLIPTOP (MISCELLANEOUS) ×1 IMPLANT
SYR 10ML LL (SYRINGE) ×1 IMPLANT
VALVE UROSEAL ADJ ENDO (VALVE) IMPLANT
WATER STERILE IRR 500ML POUR (IV SOLUTION) ×1 IMPLANT

## 2022-07-29 NOTE — Transfer of Care (Signed)
Immediate Anesthesia Transfer of Care Note  Patient: [name]  Procedure(s) Performed: CYSTOSCOPY/URETEROSCOPY/HOLMIUM LASER/STENT PLACEMENT (Right: Ureter)  Patient Location: PACU  Anesthesia Type:General  Level of Consciousness: awake, alert , and oriented  Airway & Oxygen Therapy: Patient Spontanous Breathing  Post-op Assessment: Report given to RN and Post -op Vital signs reviewed and stable  Post vital signs: Reviewed and stable  Last Vitals:  Vitals Value Taken Time  BP 140/78 07/29/22 1254  Temp    Pulse 75 07/29/22 1256  Resp 16 07/29/22 1256  SpO2 91 % 07/29/22 1256  Vitals shown include unvalidated device data.  Last Pain:  Vitals:   07/29/22 1023  TempSrc: Temporal  PainSc: 5          Complications: No notable events documented.

## 2022-07-29 NOTE — Anesthesia Postprocedure Evaluation (Signed)
Anesthesia Post Note  Patient: DOMONIQUE BROUILLARD  Procedure(s) Performed: CYSTOSCOPY/URETEROSCOPY/HOLMIUM LASER/STENT PLACEMENT (Right: Ureter)  Patient location during evaluation: PACU Anesthesia Type: General Level of consciousness: awake and alert Pain management: pain level controlled Vital Signs Assessment: post-procedure vital signs reviewed and stable Respiratory status: spontaneous breathing, nonlabored ventilation, respiratory function stable and patient connected to nasal cannula oxygen Cardiovascular status: blood pressure returned to baseline and stable Postop Assessment: no apparent nausea or vomiting Anesthetic complications: no   No notable events documented.   Last Vitals:  Vitals:   07/29/22 1254 07/29/22 1300  BP: (!) 140/78 137/73  Pulse: 77 82  Resp: 19 20  Temp: (!) 36.2 C   SpO2: 92% 91%    Last Pain:  Vitals:   07/29/22 1254  TempSrc:   PainSc: Asleep                 Ilene Qua

## 2022-07-29 NOTE — Op Note (Signed)
Date of procedure: 07/29/22  Preoperative diagnosis:  Right ureteral stone Left renal stone  Postoperative diagnosis:  Same  Procedure: Cystoscopy Right ureteroscopy, laser lithotripsy, right retrograde pyelogram with intraoperative interpretation, right ureteral stent placement  Surgeon: Nickolas Madrid, MD  Anesthesia: General  Complications: None  Intraoperative findings:  Small prostate, normal cystoscopy Extremely tight right ureter, very difficult to access and fragment mid ureteral stone, uncomplicated stent placement Unable to advance the digital ureteroscope into the left ureter  EBL: Minimal  Specimens: None  Drains: Right 6 French by 28 cm ureteral stent  Indication: [name] is a 57 y.o. patient with right mid ureteral stone and ongoing renal colic, as well as a nonobstructive left lower pole renal stone.  After reviewing the management options for treatment, they elected to proceed with the above surgical procedure(s). We have discussed the potential benefits and risks of the procedure, side effects of the proposed treatment, the likelihood of the patient achieving the goals of the procedure, and any potential problems that might occur during the procedure or recuperation. Informed consent has been obtained.  Description of procedure:  The patient was taken to the operating room and general anesthesia was induced. SCDs were placed for DVT prophylaxis. The patient was placed in the dorsal lithotomy position, prepped and draped in the usual sterile fashion, and preoperative antibiotics were administered. A preoperative time-out was performed.   A 21 French rigid cystoscope was used to intubate the urethra and a normal urethra was followed proximally into the bladder.  The prostate was small.  Thorough cystoscopy showed no suspicious lesions, and the ureteral orifices were orthotopic bilaterally.  A sensor wire passed easily into the right ureteral orifice and up  to the kidney under fluoroscopic vision.  A semirigid long ureteroscope was advanced alongside the wire and the distal ureter was extremely tight.  I was able to ultimately navigate the scope up to the mid ureter where there was a black stone.  This was just out of reach with the laser and I used a basket to move the stone slightly more distal where could safely be laser.  The 200 m laser fiber on settings of 1.0 J and 10 Hz was then used to methodically dust the stone to fragments smaller than the laser fiber.  A retrograde pyelogram was performed through the scope which showed no hydronephrosis or extravasation.  Pullback ureteroscopy showed no other significant residual stone fragments.  A rigid cystoscope was backloaded over the wire and a 6 [place] by 28 cm ureteral stent was placed with an excellent curl in the upper pole, as well as in the bladder under direct vision.  I then turned my attention to the left side and a sensor wire advanced easily up to the left kidney under fluoroscopic vision.  I attempted to advance the digital single-channel flexible ureteroscope over the wire but met significant resistance at the distal ureter.  Based on the tightness of the ureter on the right side, I opted to defer stent placement or attempted dilation, and continue observation for his nonobstructing left-sided lower pole stone.  The bladder was drained and this concluded the procedure.  Disposition: Stable to PACU  Plan: Stent removal Wednesday or Thursday next week Consider surveillance versus shockwave for left lower pole stone  Nickolas Madrid, MD

## 2022-07-29 NOTE — Anesthesia Preprocedure Evaluation (Addendum)
Anesthesia Evaluation  Patient identified by MRN, date of birth, ID band Patient awake    Reviewed: Allergy & Precautions, NPO status , Patient's Chart, lab work & pertinent test results  History of Anesthesia Complications (+) PROLONGED EMERGENCE and history of anesthetic complications  Airway Mallampati: II  TM Distance: >3 FB Neck ROM: full    Dental  (+) Teeth Intact   Pulmonary sleep apnea    Pulmonary exam normal        Cardiovascular negative cardio ROS Normal cardiovascular exam     Neuro/Psych  PSYCHIATRIC DISORDERS  Depression     Neuromuscular disease    GI/Hepatic negative GI ROS, Neg liver ROS,,,  Endo/Other  diabetes    Renal/GU      Musculoskeletal   Abdominal   Peds  Hematology negative hematology ROS (+)   Anesthesia Other Findings Past Medical History: 05/20/2022: Acute prostatitis without hematuria No date: Allergy No date: Complication of anesthesia     Comment:  pt states at [place] he stopped breathing due to his sleep               apnea-hard to wake up No date: COVID-19 No date: Depression No date: Diabetic ulcer of both feet (HCC) No date: DM (diabetes mellitus), type 2 ([place]) No date: History of kidney stones 09/2020: History of methicillin resistant staphylococcus aureus (MRSA)     Comment:  foot No date: Neuromuscular disorder (HCC)     Comment:  neuropathy of back and bilateral feet No date: Obesity No date: Sleep apnea     Comment:  does not use cpap  Past Surgical History: No date: AMPUTATION; Left     Comment:  toe amputation 09/2012: FOOT SURGERY; Right     Comment:  cyst removed 2004: SPINE SURGERY     Comment:  nerve damage in spine 06/30/2016: TOE AMPUTATION; Right     Comment:  5 toes  BMI    Body Mass Index: 32.99 kg/m      Reproductive/Obstetrics negative OB ROS                             Anesthesia Physical Anesthesia  Plan  ASA: 3  Anesthesia Plan: General ETT   Post-op Pain Management: Minimal or no pain anticipated   Induction: Intravenous  PONV Risk Score and Plan: 2 and Ondansetron, Dexamethasone, Midazolam and Treatment may vary due to age or medical condition  Airway Management Planned: Oral ETT  Additional Equipment:   Intra-op Plan:   Post-operative Plan: Extubation in OR  Informed Consent: I have reviewed the patients History and Physical, chart, labs and discussed the procedure including the risks, benefits and alternatives for the proposed anesthesia with the patient or authorized representative who has indicated his/her understanding and acceptance.     Dental Advisory Given  Plan Discussed with: Anesthesiologist, CRNA and Surgeon  Anesthesia Plan Comments: (Patient consented for risks of anesthesia including but not limited to:  - adverse reactions to medications - damage to eyes, teeth, lips or other oral mucosa - nerve damage due to positioning  - sore throat or hoarseness - Damage to heart, brain, nerves, lungs, other parts of body or loss of life  Patient voiced understanding.)        Anesthesia Quick Evaluation

## 2022-07-29 NOTE — Anesthesia Procedure Notes (Signed)
Procedure Name: Intubation Date/Time: 07/29/2022 12:08 PM  Performed by: Kelton Pillar, CRNAPre-anesthesia Checklist: Patient identified, Emergency Drugs available, Suction available and Patient being monitored Patient Re-evaluated:Patient Re-evaluated prior to induction Oxygen Delivery Method: Circle system utilized Preoxygenation: Pre-oxygenation with 100% oxygen Induction Type: IV induction Ventilation: Mask ventilation without difficulty Laryngoscope Size: McGraph and 4 Grade View: Grade I Tube type: Oral Tube size: 7.5 mm Number of attempts: 1 Airway Equipment and Method: Stylet and Oral airway Placement Confirmation: ETT inserted through vocal cords under direct vision, positive ETCO2, breath sounds checked- equal and bilateral and CO2 detector Secured at: 21 cm Tube secured with: Tape Dental Injury: Teeth and Oropharynx as per pre-operative assessment

## 2022-07-29 NOTE — Discharge Instructions (Addendum)
AMBULATORY SURGERY  DISCHARGE INSTRUCTIONS   The drugs that you were given will stay in your system until tomorrow so for the next 24 hours you should not:  Drive an automobile Make any legal decisions Drink any alcoholic beverage   You may resume regular meals tomorrow.  Today it is better to start with liquids and gradually work up to solid foods.  You may eat anything you prefer, but it is better to start with liquids, then soup and crackers, and gradually work up to solid foods.   Please notify your doctor immediately if you have any unusual bleeding, trouble breathing, redness and pain at the surgery site, drainage, fever, or pain not relieved by medication.       Please contact your physician with any problems or Same Day Surgery at [phone], Monday through Friday 6 am to 4 pm, or [place] at [place] Main number at [phone].  

## 2022-07-29 NOTE — Interval H&P Note (Signed)
UROLOGY H&P UPDATE  Agree with prior H&P dated 07/26/2022.  6 mm right mid ureteral stone and right-sided flank pain.  He also requested we address his left-sided lower pole nonobstructing renal stone if able.  He understands possibility of inability to access the left-sided collecting system and renal stone.  Cardiac: RRR Lungs: CTA bilaterally  Laterality: Bilateral Procedure: Bilateral ureteroscopy, laser lithotripsy, stent placement  Urine: Urine culture 11/19 no growth  We specifically discussed the risks ureteroscopy including bleeding, infection/sepsis, stent related symptoms including flank pain/urgency/frequency/incontinence/dysuria, ureteral injury, inability to access stone, or need for staged or additional procedures.   Billey Co, MD 07/29/2022

## 2022-07-30 ENCOUNTER — Encounter: Payer: Self-pay | Admitting: Urology

## 2022-08-03 ENCOUNTER — Ambulatory Visit (INDEPENDENT_AMBULATORY_CARE_PROVIDER_SITE_OTHER): Payer: BC Managed Care – PPO | Admitting: Urology

## 2022-08-03 ENCOUNTER — Encounter: Payer: Self-pay | Admitting: Urology

## 2022-08-03 VITALS — BP 126/79 | HR 121 | Ht 70.0 in | Wt 229.0 lb

## 2022-08-03 DIAGNOSIS — Z466 Encounter for fitting and adjustment of urinary device: Secondary | ICD-10-CM

## 2022-08-03 DIAGNOSIS — N200 Calculus of kidney: Secondary | ICD-10-CM

## 2022-08-03 MED ORDER — CEPHALEXIN 250 MG PO CAPS
500.0000 mg | ORAL_CAPSULE | Freq: Once | ORAL | Status: AC
  Administered 2022-08-03: 500 mg via ORAL

## 2022-08-03 NOTE — Progress Notes (Signed)
Cystoscopy Procedure Note:  Indication: Stent removal s/p right ureteroscopy, laser lithotripsy, stent placement on 07/29/2022  Keflex given for prophylaxis  After informed consent and discussion of the procedure and its risks, [name] was positioned and prepped in the standard fashion. Cystoscopy was performed with a flexible cystoscope. The stent was grasped with flexible graspers and removed in its entirety. The patient tolerated the procedure well.  Findings: Uncomplicated stent removal  Assessment and Plan: Was unable to access left kidney with ureteroscopy on 07/29/2022, could consider left shockwave lithotripsy for surveillance in the future pending patient preference  RTC 6 weeks with KUB prior   Billey Co, MD 08/03/2022

## 2022-08-04 LAB — MICROSCOPIC EXAMINATION: RBC, Urine: >30 /hpf — ABNORMAL HIGH (ref 0–2)

## 2022-08-04 LAB — URINALYSIS, COMPLETE
Bilirubin, UA: NEGATIVE
Glucose, UA: NEGATIVE
Ketones, UA: NEGATIVE
Nitrite, UA: NEGATIVE
Specific Gravity, UA: 1.02 (ref 1.005–1.030)
Urobilinogen, Ur: 1 mg/dL (ref 0.2–1.0)
pH, UA: 7.5 (ref 5.0–7.5)

## 2022-08-06 ENCOUNTER — Encounter: Payer: Self-pay | Admitting: Internal Medicine

## 2022-08-10 NOTE — Telephone Encounter (Signed)
MyChart messgae sent to patient. 

## 2022-08-13 ENCOUNTER — Ambulatory Visit: Admit: 2022-08-13 | Payer: BC Managed Care – PPO

## 2022-08-16 ENCOUNTER — Encounter: Payer: Self-pay | Admitting: Internal Medicine

## 2022-08-16 NOTE — Telephone Encounter (Signed)
Not in current medication list.   Last OV: 06/29/2022 Next OV: not scheduled

## 2022-08-17 MED ORDER — OXYCODONE HCL 5 MG PO TABS: 5.0000 mg | ORAL_TABLET | Freq: Every evening | ORAL | 0 refills | Status: DC | PRN

## 2022-08-18 ENCOUNTER — Other Ambulatory Visit: Payer: Self-pay | Admitting: Internal Medicine

## 2022-08-18 MED ORDER — OXYCODONE HCL 5 MG PO TABS: 5.0000 mg | ORAL_TABLET | Freq: Every evening | ORAL | 0 refills | Status: DC | PRN

## 2022-09-13 ENCOUNTER — Telehealth: Payer: Self-pay

## 2022-09-13 NOTE — Telephone Encounter (Signed)
I left a voicemail for patient letting him know that we are following up on MyChart visits and he had a visit with Dr. Deborra Medina which required some follow-up visits.  I asked him to please call our office to schedule.

## 2022-09-16 ENCOUNTER — Other Ambulatory Visit: Payer: Self-pay | Admitting: Internal Medicine

## 2022-09-17 ENCOUNTER — Encounter: Payer: Self-pay | Admitting: *Deleted

## 2022-09-17 NOTE — Telephone Encounter (Signed)
Requesting: Oxycodone '5mg'$  Contract: No UDS: Not on file Last Visit: 03/28/2022 Next Visit: 10/04/2022 Last Refill:  08/18/22   Medication Renewal Request Received: [name], [name] [facility] Clinical Pool Phone Number: [phone]   Refills have been requested for the following medications:      oxyCODONE (ROXICODONE) 5 MG immediate release tablet [name]  [facility]erred pharmacy[id number] [place] [address] Delivery method: Pickup   very important  Medications from outside sources need reconciliation.        Requested Renewals   oxyCODONE (ROXICODONE) 5 MG immediate release tablet       Sig: Take 1 tablet (5 mg total) by mouth at bedtime as needed for severe pain.   Disp: 30 tablet    Refills: 0   Start: 09/16/2022   Earliest Fill Date: 09/16/2022   Class: Normal   Non-formulary   To pharmacy: Do not refill less than 30 days from prior refill   Last ordered: 1 month ago (08/18/2022) by TCrecencio Mc MD     [id number] [place] [address]      09/16/2022 - Rx Request: Mychart, Generic (Newest Message First) View All Conversations on this Encounter September 16, 2022 St[facility]AYMAN HOARDto P [facility] Clinical Pool      09/16/22  8:58 PM Refills have been requested for the following medications:       oxyCODONE (ROXICODONE) 5 MG immediate release tablet [THelene Kelp Tullo]   [id number] Delivery method: Pickup  This encounter is not signed. The conversation may still be ongoing.        Please Advise

## 2022-09-20 MED ORDER — OXYCODONE HCL 5 MG PO TABS: 5.0000 mg | ORAL_TABLET | Freq: Every evening | ORAL | 0 refills | Status: DC | PRN

## 2022-09-21 ENCOUNTER — Other Ambulatory Visit (INDEPENDENT_AMBULATORY_CARE_PROVIDER_SITE_OTHER): Payer: BC Managed Care – PPO

## 2022-09-21 DIAGNOSIS — E1169 Type 2 diabetes mellitus with other specified complication: Secondary | ICD-10-CM

## 2022-09-21 DIAGNOSIS — R944 Abnormal results of kidney function studies: Secondary | ICD-10-CM

## 2022-09-21 DIAGNOSIS — N200 Calculus of kidney: Secondary | ICD-10-CM

## 2022-09-21 DIAGNOSIS — E785 Hyperlipidemia, unspecified: Secondary | ICD-10-CM

## 2022-09-21 DIAGNOSIS — R5383 Other fatigue: Secondary | ICD-10-CM | POA: Diagnosis not present

## 2022-09-21 LAB — CBC WITH DIFFERENTIAL/PLATELET
Basophils Absolute: 0.2 10*3/uL — ABNORMAL HIGH (ref 0.0–0.1)
Basophils Relative: 3.4 % — ABNORMAL HIGH (ref 0.0–3.0)
Eosinophils Absolute: 0.3 10*3/uL (ref 0.0–0.7)
Eosinophils Relative: 5.1 % — ABNORMAL HIGH (ref 0.0–5.0)
HCT: 45.7 % (ref 39.0–52.0)
Hemoglobin: 15.8 g/dL (ref 13.0–17.0)
Lymphocytes Relative: 23.7 % (ref 12.0–46.0)
Lymphs Abs: 1.5 10*3/uL (ref 0.7–4.0)
MCHC: 34.6 g/dL (ref 30.0–36.0)
MCV: 93.4 fl (ref 78.0–100.0)
Monocytes Absolute: 0.9 10*3/uL (ref 0.1–1.0)
Monocytes Relative: 14.7 % — ABNORMAL HIGH (ref 3.0–12.0)
Neutro Abs: 3.4 10*3/uL (ref 1.4–7.7)
Neutrophils Relative %: 53.1 % (ref 43.0–77.0)
Platelets: 184 10*3/uL (ref 150.0–400.0)
RBC: 4.89 Mil/uL (ref 4.22–5.81)
RDW: 15.3 % (ref 11.5–15.5)
WBC: 6.4 10*3/uL (ref 4.0–10.5)

## 2022-09-21 LAB — LIPID PANEL
Cholesterol: 116 mg/dL (ref 0–200)
HDL: 37.3 mg/dL — ABNORMAL LOW (ref >39.00)
LDL Cholesterol: 61 mg/dL (ref 0–99)
NonHDL: 78.91
Total CHOL/HDL Ratio: 3
Triglycerides: 92 mg/dL (ref 0.0–149.0)
VLDL: 18.4 mg/dL (ref 0.0–40.0)

## 2022-09-21 LAB — BASIC METABOLIC PANEL
BUN: 19 mg/dL (ref 6–23)
CO2: 25 mEq/L (ref 19–32)
Calcium: 8.7 mg/dL (ref 8.4–10.5)
Chloride: 104 mEq/L (ref 96–112)
Creatinine, Ser: 0.82 mg/dL (ref 0.40–1.50)
GFR: 97.09 mL/min (ref >60.00)
Glucose, Bld: 147 mg/dL — ABNORMAL HIGH (ref 70–99)
Potassium: 4.1 mEq/L (ref 3.5–5.1)
Sodium: 138 mEq/L (ref 135–145)

## 2022-09-21 LAB — COMPREHENSIVE METABOLIC PANEL
ALT: 50 U/L (ref 0–53)
AST: 35 U/L (ref 0–37)
Albumin: 4.2 g/dL (ref 3.5–5.2)
Alkaline Phosphatase: 31 U/L — ABNORMAL LOW (ref 39–117)
BUN: 19 mg/dL (ref 6–23)
CO2: 25 mEq/L (ref 19–32)
Calcium: 8.7 mg/dL (ref 8.4–10.5)
Chloride: 104 mEq/L (ref 96–112)
Creatinine, Ser: 0.82 mg/dL (ref 0.40–1.50)
GFR: 97.09 mL/min (ref >60.00)
Glucose, Bld: 147 mg/dL — ABNORMAL HIGH (ref 70–99)
Potassium: 4.1 mEq/L (ref 3.5–5.1)
Sodium: 138 mEq/L (ref 135–145)
Total Bilirubin: 0.8 mg/dL (ref 0.2–1.2)
Total Protein: 7.3 g/dL (ref 6.0–8.3)

## 2022-09-21 LAB — URINALYSIS, ROUTINE W REFLEX MICROSCOPIC
Bilirubin Urine: NEGATIVE
Hgb urine dipstick: NEGATIVE
Ketones, ur: NEGATIVE
Leukocytes,Ua: NEGATIVE
Nitrite: NEGATIVE
Specific Gravity, Urine: >=1.03 — AB (ref 1.000–1.030)
Total Protein, Urine: NEGATIVE
Urine Glucose: NEGATIVE
Urobilinogen, UA: 0.2 (ref 0.0–1.0)
pH: 5.5 (ref 5.0–8.0)

## 2022-09-21 LAB — HEMOGLOBIN A1C: Hgb A1c MFr Bld: 6.3 % (ref 4.6–6.5)

## 2022-09-25 ENCOUNTER — Encounter: Payer: Self-pay | Admitting: Internal Medicine

## 2022-10-04 ENCOUNTER — Encounter: Payer: Self-pay | Admitting: Internal Medicine

## 2022-10-04 ENCOUNTER — Ambulatory Visit: Payer: BC Managed Care – PPO | Admitting: Internal Medicine

## 2022-10-04 VITALS — BP 118/70 | HR 77 | Temp 97.9°F | Ht 70.0 in | Wt 228.6 lb

## 2022-10-04 DIAGNOSIS — F331 Major depressive disorder, recurrent, moderate: Secondary | ICD-10-CM

## 2022-10-04 DIAGNOSIS — E669 Obesity, unspecified: Secondary | ICD-10-CM | POA: Diagnosis not present

## 2022-10-04 DIAGNOSIS — L89894 Pressure ulcer of other site, stage 4: Secondary | ICD-10-CM | POA: Diagnosis not present

## 2022-10-04 DIAGNOSIS — E785 Hyperlipidemia, unspecified: Secondary | ICD-10-CM | POA: Diagnosis not present

## 2022-10-04 DIAGNOSIS — E1169 Type 2 diabetes mellitus with other specified complication: Secondary | ICD-10-CM | POA: Diagnosis not present

## 2022-10-04 DIAGNOSIS — D582 Other hemoglobinopathies: Secondary | ICD-10-CM

## 2022-10-04 DIAGNOSIS — Z89431 Acquired absence of right foot: Secondary | ICD-10-CM

## 2022-10-04 MED ORDER — OXYCODONE HCL 5 MG PO TABS: 5.0000 mg | ORAL_TABLET | Freq: Every evening | ORAL | 0 refills | Status: DC | PRN

## 2022-10-04 MED ORDER — TELMISARTAN 20 MG PO TABS: 20.0000 mg | ORAL_TABLET | Freq: Every day | ORAL | 0 refills | Status: DC

## 2022-10-04 NOTE — Assessment & Plan Note (Signed)
Well controlled with metformin.   tolerating telmisartan 20 mg , atorvastatin 20 mg   Lab Results  Component Value Date   HGBA1C 6.3 09/21/2022   Lab Results  Component Value Date   CHOL 116 09/21/2022   HDL 37.30 (L) 09/21/2022   LDLCALC 61 09/21/2022   LDLDIRECT 121.0 11/27/2019   TRIG 92.0 09/21/2022   CHOLHDL 3 09/21/2022   Lab Results  Component Value Date   LABMICR See below: 08/03/2022   MICROALBUR 3.2 (H) 02/22/2022   MICROALBUR 6.9 (H) 10/01/2020

## 2022-10-04 NOTE — Assessment & Plan Note (Signed)
He has been adherent to diet due to workplace distractions. He is exercising regularly now.

## 2022-10-04 NOTE — Assessment & Plan Note (Signed)
Symptoms have been aggravated by marital conflict.  He declines medication adjustment but has accepted  a list of therapists.

## 2022-10-04 NOTE — Patient Instructions (Signed)
Your labs look fine   We will recheck your urine for microscopic protein

## 2022-10-04 NOTE — Progress Notes (Signed)
Subjective:  Patient ID: [name], male    DOB: [birth year]  Age: 57 y.o. MRN: [id number]  CC: The primary encounter diagnosis was Hyperlipidemia associated with type 2 diabetes mellitus ([place]). 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   HPI [name] presents for follow up on multiple issues  Chief Complaint  Patient presents with   Medical Management of Chronic Issues    3 month follow up on diabetes   1) type 2 DM with neuroapthy needing new shoes,  some skin breakdown on the right foot aggravated by started exercising (on Jan 1 /  losing 1 lb a week/) bike 30 minutes on a bike)  wt gain of 16 lbs   Diet:  bkfst is 2 eggs or protein waffle 5:30 am   cheese stick at 8 am .  Lunch meat nd veggie wrap in lowc arb wrap or salad.  Dinner is whatever Santiago Glad makes.  Beverages water day,  coke zero at night . Does not snack at night   2) bilateral stones: patient was admitted on Dec 1 for treatment of a right mid ureteral stone with right ureteroscopy, pyelogram and ureteral stent  and cytoscopy lithtripsy and stent placement right kidney in December.  Attifbuted to high protein .  Has stopped using flomax   Outpatient Medications Prior to Visit  Medication Sig Dispense Refill   aspirin EC 81 MG tablet Take 81 mg by mouth daily. Swallow whole.     atorvastatin (LIPITOR) 20 MG tablet TAKE 1 TABLET BY MOUTH EVERY DAY 90 tablet 3   DULoxetine (CYMBALTA) 30 MG capsule TAKE 1 CAPSULE BY MOUTH EVERY DAY 90 capsule 2   metFORMIN (GLUCOPHAGE-XR) 500 MG 24 hr tablet TAKE 1 TABLET BY MOUTH EVERY DAY WITH BREAKFAST 90 tablet 1   Multiple Vitamins-Minerals (MULTIVITAMIN WITH MINERALS) tablet Take 1 tablet by mouth daily.     oxyCODONE (ROXICODONE) 5 MG immediate release  tablet Take 1 tablet (5 mg total) by mouth at bedtime as needed for severe pain. 30 tablet 0   telmisartan (MICARDIS) 20 MG tablet Take 1 tablet (20 mg total) by mouth at bedtime. 90 tablet 0   No facility-administered medications prior to visit.    Review of Systems;  Patient denies headache, fevers, malaise, unintentional weight loss, skin rash, eye pain, sinus congestion and sinus pain, sore throat, dysphagia,  hemoptysis , cough, dyspnea, wheezing, chest pain, palpitations, orthopnea, edema, abdominal pain, nausea, melena, diarrhea, constipation, flank pain, dysuria, hematuria, urinary  Frequency, nocturia, numbness, tingling, seizures,  Focal weakness, Loss of consciousness,  Tremor, insomnia, depression, anxiety, and suicidal ideation.      Objective:  BP 118/70   Pulse 77   Temp 97.9 F (36.6 C) (Oral)   Ht '5\' 10"'$  (1.778 m)   Wt 228 lb 9.6 oz (103.7 kg)   SpO2 97%   BMI 32.80 kg/m   BP Readings from Last 3 Encounters:  10/04/22 118/70  08/03/22 126/79  07/29/22 137/79    Wt Readings from Last 3 Encounters:  10/04/22 228 lb 9.6 oz (103.7 kg)  08/03/22 229 lb (103.9 kg)  07/29/22 229 lb 15 oz (104.3 kg)    Physical Exam  Lab Results  Component Value Date   HGBA1C 6.3 09/21/2022   HGBA1C 5.6 02/22/2022  HGBA1C 5.5 07/27/2021    Lab Results  Component Value Date   CREATININE 0.82 09/21/2022   CREATININE 0.82 09/21/2022   CREATININE 0.88 04/26/2022    Lab Results  Component Value Date   WBC 6.4 09/21/2022   HGB 15.8 09/21/2022   HCT 45.7 09/21/2022   PLT 184.0 09/21/2022   GLUCOSE 147 (H) 09/21/2022   GLUCOSE 147 (H) 09/21/2022   CHOL 116 09/21/2022   TRIG 92.0 09/21/2022   HDL 37.30 (L) 09/21/2022   LDLDIRECT 121.0 11/27/2019   LDLCALC 61 09/21/2022   ALT 50 09/21/2022   AST 35 09/21/2022   NA 138 09/21/2022   NA 138 09/21/2022   K 4.1 09/21/2022   K 4.1 09/21/2022   CL 104 09/21/2022   CL 104 09/21/2022   CREATININE 0.82 09/21/2022    CREATININE 0.82 09/21/2022   BUN 19 09/21/2022   BUN 19 09/21/2022   CO2 25 09/21/2022   CO2 25 09/21/2022   TSH 0.59 02/22/2022   PSA 0.18 02/22/2022   INR 1.3 (A) 04/08/2017   HGBA1C 6.3 09/21/2022   MICROALBUR 3.2 (H) 02/22/2022    DG OR UROLOGY CYSTO IMAGE ([facility] ONLY)  Result Date: 07/29/2022 There is no interpretation for this exam.  This order is for images obtained during a surgical procedure.  Please See "Surgeries" Tab for more information regarding the procedure.    Assessment & Plan:  .Hyperlipidemia associated with type 2 diabetes mellitus ([place]) Assessment & Plan:  Well controlled with metformin.   tolerating telmisartan 20 mg , atorvastatin 20 mg   Lab Results  Component Value Date   HGBA1C 6.3 09/21/2022   Lab Results  Component Value Date   CHOL 116 09/21/2022   HDL 37.30 (L) 09/21/2022   LDLCALC 61 09/21/2022   LDLDIRECT 121.0 11/27/2019   TRIG 92.0 09/21/2022   CHOLHDL 3 09/21/2022   Lab Results  Component Value Date   LABMICR See below: 08/03/2022   MICROALBUR 3.2 (H) 02/22/2022   MICROALBUR 6.9 (H) 10/01/2020        Orders: -     Microalbumin / creatinine urine ratio  Obesity (BMI 30-39.9) Assessment & Plan: He has been adherent to diet due to workplace distractions. He is exercising regularly now.    Elevated hemoglobin (HCC) Assessment & Plan: Likely secondary to untreated sleep apnea.  Hematology referral x 2,  (2016 and 2022) patient has not  Been seen.  Current hgb is normal /   Lab Results  Component Value Date   WBC 6.4 09/21/2022   HGB 15.8 09/21/2022   HCT 45.7 09/21/2022   MCV 93.4 09/21/2022   PLT 184.0 09/21/2022   Repeat hgb is normal.    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  He declines medication adjustment  but has accepted  a list of therapists.    Other orders -     Telmisartan; Take 1 tablet (20 mg total) by mouth at bedtime.  Dispense: 90 tablet; Refill: 0 -     oxyCODONE HCl; Take 1 tablet (5 mg total) by mouth at bedtime as needed for severe pain.  Dispense: 30 tablet; Refill: 0     I provided 30 minutes of face-to-face time during this encounter reviewing patient's last visit with me, patient's  most recent visit with urology   recent  surgical and non surgical procedures, previous  labs and imaging studies, counseling on currently addressed issues,  and post visit ordering to diagnostics and therapeutics .   Follow-up: No follow-ups on file.   Crecencio Mc, MD

## 2022-10-04 NOTE — Assessment & Plan Note (Addendum)
Likely secondary to untreated sleep apnea.  Hematology referral x 2,  (2016 and 2022) patient has not  Been seen.  Current hgb is normal /   Lab Results  Component Value Date   WBC 6.4 09/21/2022   HGB 15.8 09/21/2022   HCT 45.7 09/21/2022   MCV 93.4 09/21/2022   PLT 184.0 09/21/2022   Repeat hgb is normal.

## 2022-10-05 LAB — MICROALBUMIN / CREATININE URINE RATIO
Creatinine,U: 66 mg/dL
Microalb Creat Ratio: 1.9 mg/g (ref 0.0–30.0)
Microalb, Ur: 1.2 mg/dL (ref 0.0–1.9)

## 2022-10-09 ENCOUNTER — Encounter: Payer: Self-pay | Admitting: Internal Medicine

## 2022-10-16 ENCOUNTER — Encounter: Payer: Self-pay | Admitting: Internal Medicine

## 2022-10-17 MED ORDER — OXYCODONE HCL 5 MG PO TABS: 7.5000 mg | ORAL_TABLET | Freq: Every day | ORAL | 0 refills | Status: DC

## 2022-11-27 ENCOUNTER — Other Ambulatory Visit: Payer: Self-pay | Admitting: Internal Medicine

## 2022-11-29 MED ORDER — OXYCODONE HCL 5 MG PO TABS: 7.5000 mg | ORAL_TABLET | Freq: Every day | ORAL | 0 refills | Status: DC

## 2022-12-18 ENCOUNTER — Emergency Department: Payer: BC Managed Care – PPO

## 2022-12-18 ENCOUNTER — Inpatient Hospital Stay
Admission: EM | Admit: 2022-12-18 | Discharge: 2022-12-21 | DRG: 871 | Disposition: A | Discharge status: Home / Self Care | Payer: BC Managed Care – PPO | Attending: Student | Admitting: Student

## 2022-12-18 ENCOUNTER — Inpatient Hospital Stay: Payer: BC Managed Care – PPO

## 2022-12-18 ENCOUNTER — Other Ambulatory Visit: Payer: Self-pay

## 2022-12-18 DIAGNOSIS — I10 Essential (primary) hypertension: Secondary | ICD-10-CM | POA: Diagnosis present

## 2022-12-18 DIAGNOSIS — Z8616 Personal history of COVID-19: Secondary | ICD-10-CM | POA: Diagnosis not present

## 2022-12-18 DIAGNOSIS — L03115 Cellulitis of right lower limb: Secondary | ICD-10-CM | POA: Diagnosis present

## 2022-12-18 DIAGNOSIS — B952 Enterococcus as the cause of diseases classified elsewhere: Secondary | ICD-10-CM | POA: Diagnosis present

## 2022-12-18 DIAGNOSIS — B9562 Methicillin resistant Staphylococcus aureus infection as the cause of diseases classified elsewhere: Secondary | ICD-10-CM | POA: Diagnosis not present

## 2022-12-18 DIAGNOSIS — E08621 Diabetes mellitus due to underlying condition with foot ulcer: Secondary | ICD-10-CM | POA: Diagnosis present

## 2022-12-18 DIAGNOSIS — Z7984 Long term (current) use of oral hypoglycemic drugs: Secondary | ICD-10-CM

## 2022-12-18 DIAGNOSIS — Z8249 Family history of ischemic heart disease and other diseases of the circulatory system: Secondary | ICD-10-CM

## 2022-12-18 DIAGNOSIS — E119 Type 2 diabetes mellitus without complications: Secondary | ICD-10-CM

## 2022-12-18 DIAGNOSIS — Z89421 Acquired absence of other right toe(s): Secondary | ICD-10-CM

## 2022-12-18 DIAGNOSIS — G608 Other hereditary and idiopathic neuropathies: Secondary | ICD-10-CM | POA: Diagnosis present

## 2022-12-18 DIAGNOSIS — A419 Sepsis, unspecified organism: Secondary | ICD-10-CM | POA: Diagnosis present

## 2022-12-18 DIAGNOSIS — F339 Major depressive disorder, recurrent, unspecified: Secondary | ICD-10-CM | POA: Diagnosis present

## 2022-12-18 DIAGNOSIS — R6520 Severe sepsis without septic shock: Secondary | ICD-10-CM | POA: Diagnosis present

## 2022-12-18 DIAGNOSIS — L97502 Non-pressure chronic ulcer of other part of unspecified foot with fat layer exposed: Secondary | ICD-10-CM | POA: Diagnosis present

## 2022-12-18 DIAGNOSIS — Z881 Allergy status to other antibiotic agents status: Secondary | ICD-10-CM

## 2022-12-18 DIAGNOSIS — E1169 Type 2 diabetes mellitus with other specified complication: Secondary | ICD-10-CM | POA: Diagnosis present

## 2022-12-18 DIAGNOSIS — L97412 Non-pressure chronic ulcer of right heel and midfoot with fat layer exposed: Secondary | ICD-10-CM | POA: Diagnosis not present

## 2022-12-18 DIAGNOSIS — B961 Klebsiella pneumoniae [K. pneumoniae] as the cause of diseases classified elsewhere: Secondary | ICD-10-CM | POA: Diagnosis present

## 2022-12-18 DIAGNOSIS — T8789 Other complications of amputation stump: Secondary | ICD-10-CM | POA: Diagnosis not present

## 2022-12-18 DIAGNOSIS — E785 Hyperlipidemia, unspecified: Secondary | ICD-10-CM | POA: Diagnosis present

## 2022-12-18 DIAGNOSIS — E11628 Type 2 diabetes mellitus with other skin complications: Secondary | ICD-10-CM | POA: Diagnosis not present

## 2022-12-18 DIAGNOSIS — R509 Fever, unspecified: Secondary | ICD-10-CM

## 2022-12-18 DIAGNOSIS — J189 Pneumonia, unspecified organism: Secondary | ICD-10-CM

## 2022-12-18 DIAGNOSIS — Z7982 Long term (current) use of aspirin: Secondary | ICD-10-CM

## 2022-12-18 DIAGNOSIS — Z823 Family history of stroke: Secondary | ICD-10-CM

## 2022-12-18 DIAGNOSIS — E669 Obesity, unspecified: Secondary | ICD-10-CM | POA: Diagnosis not present

## 2022-12-18 DIAGNOSIS — E1142 Type 2 diabetes mellitus with diabetic polyneuropathy: Secondary | ICD-10-CM | POA: Diagnosis present

## 2022-12-18 DIAGNOSIS — Z6832 Body mass index (BMI) 32.0-32.9, adult: Secondary | ICD-10-CM | POA: Diagnosis not present

## 2022-12-18 DIAGNOSIS — R0902 Hypoxemia: Secondary | ICD-10-CM | POA: Diagnosis present

## 2022-12-18 DIAGNOSIS — Z1152 Encounter for screening for COVID-19: Secondary | ICD-10-CM

## 2022-12-18 DIAGNOSIS — Z79899 Other long term (current) drug therapy: Secondary | ICD-10-CM

## 2022-12-18 DIAGNOSIS — Z89431 Acquired absence of right foot: Secondary | ICD-10-CM

## 2022-12-18 DIAGNOSIS — G4733 Obstructive sleep apnea (adult) (pediatric): Secondary | ICD-10-CM | POA: Diagnosis present

## 2022-12-18 DIAGNOSIS — E11621 Type 2 diabetes mellitus with foot ulcer: Secondary | ICD-10-CM | POA: Diagnosis present

## 2022-12-18 DIAGNOSIS — Z794 Long term (current) use of insulin: Secondary | ICD-10-CM | POA: Diagnosis not present

## 2022-12-18 DIAGNOSIS — K5900 Constipation, unspecified: Secondary | ICD-10-CM | POA: Diagnosis present

## 2022-12-18 DIAGNOSIS — Z89422 Acquired absence of other left toe(s): Secondary | ICD-10-CM | POA: Diagnosis not present

## 2022-12-18 DIAGNOSIS — N400 Enlarged prostate without lower urinary tract symptoms: Secondary | ICD-10-CM | POA: Diagnosis present

## 2022-12-18 DIAGNOSIS — Z888 Allergy status to other drugs, medicaments and biological substances status: Secondary | ICD-10-CM

## 2022-12-18 DIAGNOSIS — L089 Local infection of the skin and subcutaneous tissue, unspecified: Secondary | ICD-10-CM | POA: Diagnosis not present

## 2022-12-18 HISTORY — DX: Pneumonia, unspecified organism: J18.9

## 2022-12-18 LAB — CBC WITH DIFFERENTIAL/PLATELET
Abs Immature Granulocytes: 0.05 10*3/uL (ref 0.00–0.07)
Basophils Absolute: 0.1 10*3/uL (ref 0.0–0.1)
Basophils Relative: 1 %
Eosinophils Absolute: 0.1 10*3/uL (ref 0.0–0.5)
Eosinophils Relative: 1 %
HCT: 47 % (ref 39.0–52.0)
Hemoglobin: 15.6 g/dL (ref 13.0–17.0)
Immature Granulocytes: 1 %
Lymphocytes Relative: 5 %
Lymphs Abs: 0.4 10*3/uL — ABNORMAL LOW (ref 0.7–4.0)
MCH: 30 pg (ref 26.0–34.0)
MCHC: 33.2 g/dL (ref 30.0–36.0)
MCV: 90.4 fL (ref 80.0–100.0)
Monocytes Absolute: 0.7 10*3/uL (ref 0.1–1.0)
Monocytes Relative: 7 %
Neutro Abs: 7.9 10*3/uL — ABNORMAL HIGH (ref 1.7–7.7)
Neutrophils Relative %: 85 %
Platelets: 143 10*3/uL — ABNORMAL LOW (ref 150–400)
RBC: 5.2 MIL/uL (ref 4.22–5.81)
RDW: 14 % (ref 11.5–15.5)
WBC: 9.2 10*3/uL (ref 4.0–10.5)
nRBC: 0 % (ref 0.0–0.2)

## 2022-12-18 LAB — COMPREHENSIVE METABOLIC PANEL
ALT: 36 U/L (ref 0–44)
AST: 41 U/L (ref 15–41)
Albumin: 3.8 g/dL (ref 3.5–5.0)
Alkaline Phosphatase: 50 U/L (ref 38–126)
Anion gap: 12 (ref 5–15)
BUN: 15 mg/dL (ref 6–20)
CO2: 20 mmol/L — ABNORMAL LOW (ref 22–32)
Calcium: 8.6 mg/dL — ABNORMAL LOW (ref 8.9–10.3)
Chloride: 105 mmol/L (ref 98–111)
Creatinine, Ser: 0.94 mg/dL (ref 0.61–1.24)
GFR, Estimated: >60 mL/min (ref >60)
Glucose, Bld: 175 mg/dL — ABNORMAL HIGH (ref 70–99)
Potassium: 3.8 mmol/L (ref 3.5–5.1)
Sodium: 137 mmol/L (ref 135–145)
Total Bilirubin: 1.8 mg/dL — ABNORMAL HIGH (ref 0.3–1.2)
Total Protein: 7.7 g/dL (ref 6.5–8.1)

## 2022-12-18 LAB — URINALYSIS, ROUTINE W REFLEX MICROSCOPIC
Bacteria, UA: NONE SEEN
Bilirubin Urine: NEGATIVE
Glucose, UA: NEGATIVE mg/dL
Ketones, ur: 20 mg/dL — AB
Leukocytes,Ua: NEGATIVE
Nitrite: NEGATIVE
Protein, ur: >=300 mg/dL — AB
Specific Gravity, Urine: 1.024 (ref 1.005–1.030)
pH: 5 (ref 5.0–8.0)

## 2022-12-18 LAB — RESP PANEL BY RT-PCR (RSV, FLU A&B, COVID)  RVPGX2
Influenza A by PCR: NEGATIVE
Influenza B by PCR: NEGATIVE
Resp Syncytial Virus by PCR: NEGATIVE
SARS Coronavirus 2 by RT PCR: NEGATIVE

## 2022-12-18 LAB — APTT: aPTT: 26 seconds (ref 24–36)

## 2022-12-18 LAB — PROCALCITONIN: Procalcitonin: 0.87 ng/mL

## 2022-12-18 LAB — GLUCOSE, CAPILLARY
Glucose-Capillary: 327 mg/dL — ABNORMAL HIGH (ref 70–99)
Glucose-Capillary: 230 mg/dL — ABNORMAL HIGH (ref 70–99)

## 2022-12-18 LAB — PROTIME-INR
INR: 1.3 — ABNORMAL HIGH (ref 0.8–1.2)
Prothrombin Time: 15.9 seconds — ABNORMAL HIGH (ref 11.4–15.2)

## 2022-12-18 LAB — LACTIC ACID, PLASMA
Lactic Acid, Venous: 2.8 mmol/L (ref 0.5–1.9)
Lactic Acid, Venous: 1.5 mmol/L (ref 0.5–1.9)

## 2022-12-18 LAB — TROPONIN I (HIGH SENSITIVITY): Troponin I (High Sensitivity): 18 ng/L — ABNORMAL HIGH (ref <18)

## 2022-12-18 LAB — BRAIN NATRIURETIC PEPTIDE: B Natriuretic Peptide: 125.4 pg/mL — ABNORMAL HIGH (ref 0.0–100.0)

## 2022-12-18 MED ORDER — OXYCODONE HCL 5 MG PO TABS
5.0000 mg | ORAL_TABLET | Freq: Four times a day (QID) | ORAL | Status: AC | PRN
  Administered 2022-12-18 – 2022-12-19 (×2): 5 mg via ORAL
  Filled 2022-12-18 (×2): qty 1

## 2022-12-18 MED ORDER — VANCOMYCIN HCL IN DEXTROSE 1-5 GM/200ML-% IV SOLN
1000.0000 mg | Freq: Once | INTRAVENOUS | Status: AC
  Administered 2022-12-18: 1000 mg via INTRAVENOUS
  Filled 2022-12-18: qty 200

## 2022-12-18 MED ORDER — INSULIN ASPART 100 UNIT/ML IJ SOLN
0.0000 [IU] | Freq: Three times a day (TID) | INTRAMUSCULAR | Status: DC
  Administered 2022-12-18: 11 [IU] via SUBCUTANEOUS
  Administered 2022-12-19: 5 [IU] via SUBCUTANEOUS
  Administered 2022-12-19: 8 [IU] via SUBCUTANEOUS
  Administered 2022-12-19: 11 [IU] via SUBCUTANEOUS
  Administered 2022-12-20: 3 [IU] via SUBCUTANEOUS
  Administered 2022-12-20 (×2): 5 [IU] via SUBCUTANEOUS
  Administered 2022-12-21: 11 [IU] via SUBCUTANEOUS
  Administered 2022-12-21: 5 [IU] via SUBCUTANEOUS
  Filled 2022-12-18 (×9): qty 1

## 2022-12-18 MED ORDER — METRONIDAZOLE 500 MG/100ML IV SOLN
500.0000 mg | Freq: Once | INTRAVENOUS | Status: AC
  Administered 2022-12-18: 500 mg via INTRAVENOUS
  Filled 2022-12-18: qty 100

## 2022-12-18 MED ORDER — ACETAMINOPHEN 650 MG RE SUPP: 650.0000 mg | Freq: Four times a day (QID) | RECTAL | Status: DC | PRN

## 2022-12-18 MED ORDER — GADOBUTROL 1 MMOL/ML IV SOLN
10.0000 mL | Freq: Once | INTRAVENOUS | Status: AC | PRN
  Administered 2022-12-18: 10 mL via INTRAVENOUS

## 2022-12-18 MED ORDER — ADULT MULTIVITAMIN W/MINERALS CH
ORAL_TABLET | Freq: Every day | ORAL | Status: DC
  Administered 2022-12-19 – 2022-12-21 (×3): 1 via ORAL
  Filled 2022-12-18 (×3): qty 1

## 2022-12-18 MED ORDER — HEPARIN SODIUM (PORCINE) 5000 UNIT/ML IJ SOLN
5000.0000 [IU] | Freq: Three times a day (TID) | INTRAMUSCULAR | Status: DC
  Administered 2022-12-18 – 2022-12-21 (×8): 5000 [IU] via SUBCUTANEOUS
  Filled 2022-12-18 (×8): qty 1

## 2022-12-18 MED ORDER — LACTATED RINGERS IV SOLN: INTRAVENOUS | Status: DC

## 2022-12-18 MED ORDER — MORPHINE SULFATE (PF) 2 MG/ML IV SOLN: 2.0000 mg | INTRAVENOUS | Status: AC | PRN

## 2022-12-18 MED ORDER — ASPIRIN 81 MG PO TBEC
81.0000 mg | DELAYED_RELEASE_TABLET | Freq: Every day | ORAL | Status: DC
  Administered 2022-12-20 – 2022-12-21 (×2): 81 mg via ORAL
  Filled 2022-12-18 (×2): qty 1

## 2022-12-18 MED ORDER — ALBUTEROL SULFATE (2.5 MG/3ML) 0.083% IN NEBU: 2.5000 mg | INHALATION_SOLUTION | Freq: Four times a day (QID) | RESPIRATORY_TRACT | Status: DC | PRN

## 2022-12-18 MED ORDER — SODIUM CHLORIDE 0.9 % IV SOLN
2.0000 g | Freq: Three times a day (TID) | INTRAVENOUS | Status: DC
  Administered 2022-12-18 – 2022-12-21 (×9): 2 g via INTRAVENOUS
  Filled 2022-12-18: qty 12.5
  Filled 2022-12-18 (×2): qty 2
  Filled 2022-12-18: qty 12.5
  Filled 2022-12-18: qty 2
  Filled 2022-12-18 (×6): qty 12.5

## 2022-12-18 MED ORDER — DIPHENHYDRAMINE HCL 50 MG/ML IJ SOLN
12.5000 mg | Freq: Once | INTRAMUSCULAR | Status: AC
  Administered 2022-12-18: 12.5 mg via INTRAVENOUS
  Filled 2022-12-18: qty 1

## 2022-12-18 MED ORDER — ONDANSETRON HCL 4 MG PO TABS: 4.0000 mg | ORAL_TABLET | Freq: Four times a day (QID) | ORAL | Status: DC | PRN

## 2022-12-18 MED ORDER — VANCOMYCIN HCL IN DEXTROSE 1-5 GM/200ML-% IV SOLN: 1000.0000 mg | Freq: Once | INTRAVENOUS | Status: DC

## 2022-12-18 MED ORDER — VANCOMYCIN HCL 1250 MG/250ML IV SOLN
1250.0000 mg | Freq: Two times a day (BID) | INTRAVENOUS | Status: DC
  Administered 2022-12-18 – 2022-12-21 (×6): 1250 mg via INTRAVENOUS
  Filled 2022-12-18 (×6): qty 250

## 2022-12-18 MED ORDER — MELATONIN 5 MG PO TABS
5.0000 mg | ORAL_TABLET | Freq: Every evening | ORAL | Status: DC | PRN
  Administered 2022-12-18 – 2022-12-19 (×2): 5 mg via ORAL
  Filled 2022-12-18 (×2): qty 1

## 2022-12-18 MED ORDER — SODIUM CHLORIDE 0.9 % IV BOLUS (SEPSIS): 500.0000 mL | Freq: Once | INTRAVENOUS | Status: DC

## 2022-12-18 MED ORDER — ATORVASTATIN CALCIUM 20 MG PO TABS
20.0000 mg | ORAL_TABLET | Freq: Every day | ORAL | Status: DC
  Administered 2022-12-18 – 2022-12-20 (×3): 20 mg via ORAL
  Filled 2022-12-18 (×3): qty 1

## 2022-12-18 MED ORDER — IRBESARTAN 150 MG PO TABS
75.0000 mg | ORAL_TABLET | Freq: Every day | ORAL | Status: DC
  Administered 2022-12-18 – 2022-12-20 (×3): 75 mg via ORAL
  Filled 2022-12-18 (×3): qty 1

## 2022-12-18 MED ORDER — TAMSULOSIN HCL 0.4 MG PO CAPS
0.4000 mg | ORAL_CAPSULE | Freq: Every day | ORAL | Status: DC
  Administered 2022-12-19 – 2022-12-21 (×3): 0.4 mg via ORAL
  Filled 2022-12-18 (×3): qty 1

## 2022-12-18 MED ORDER — SODIUM CHLORIDE 0.9 % IV SOLN
500.0000 mg | INTRAVENOUS | Status: DC
  Administered 2022-12-18 – 2022-12-19 (×2): 500 mg via INTRAVENOUS
  Filled 2022-12-18: qty 500
  Filled 2022-12-18 (×2): qty 5

## 2022-12-18 MED ORDER — INSULIN ASPART 100 UNIT/ML IJ SOLN
0.0000 [IU] | Freq: Every day | INTRAMUSCULAR | Status: DC
  Administered 2022-12-18 – 2022-12-19 (×2): 2 [IU] via SUBCUTANEOUS
  Administered 2022-12-20: 3 [IU] via SUBCUTANEOUS
  Filled 2022-12-18 (×3): qty 1

## 2022-12-18 MED ORDER — CALCIUM CARBONATE ANTACID 500 MG PO CHEW
1.0000 | CHEWABLE_TABLET | Freq: Two times a day (BID) | ORAL | Status: DC | PRN
  Administered 2022-12-18: 200 mg via ORAL
  Filled 2022-12-18: qty 1

## 2022-12-18 MED ORDER — VANCOMYCIN HCL 2000 MG/400ML IV SOLN
2000.0000 mg | Freq: Once | INTRAVENOUS | Status: DC
  Filled 2022-12-18: qty 400

## 2022-12-18 MED ORDER — SODIUM CHLORIDE 0.9 % IV BOLUS (SEPSIS)
1000.0000 mL | Freq: Once | INTRAVENOUS | Status: DC
  Administered 2022-12-18: 1000 mL via INTRAVENOUS

## 2022-12-18 MED ORDER — POLYETHYLENE GLYCOL 3350 17 G PO PACK: 17.0000 g | PACK | Freq: Two times a day (BID) | ORAL | Status: DC | PRN

## 2022-12-18 MED ORDER — ACETAMINOPHEN 325 MG PO TABS: 650.0000 mg | ORAL_TABLET | Freq: Once | ORAL | Status: DC

## 2022-12-18 MED ORDER — ACETAMINOPHEN 325 MG PO TABS
650.0000 mg | ORAL_TABLET | Freq: Four times a day (QID) | ORAL | Status: DC | PRN
  Administered 2022-12-18: 650 mg via ORAL
  Filled 2022-12-18: qty 2

## 2022-12-18 MED ORDER — VANCOMYCIN HCL IN DEXTROSE 1-5 GM/200ML-% IV SOLN
1000.0000 mg | Freq: Once | INTRAVENOUS | Status: AC
  Administered 2022-12-18: 1000 mg via INTRAVENOUS

## 2022-12-18 MED ORDER — VANCOMYCIN HCL IN DEXTROSE 1-5 GM/200ML-% IV SOLN
1000.0000 mg | Freq: Once | INTRAVENOUS | Status: DC
  Filled 2022-12-18: qty 200

## 2022-12-18 MED ORDER — SODIUM CHLORIDE 0.9 % IV BOLUS (SEPSIS)
1000.0000 mL | Freq: Once | INTRAVENOUS | Status: AC
  Administered 2022-12-18: 1000 mL via INTRAVENOUS

## 2022-12-18 MED ORDER — SENNOSIDES-DOCUSATE SODIUM 8.6-50 MG PO TABS: 1.0000 | ORAL_TABLET | Freq: Every evening | ORAL | Status: DC | PRN

## 2022-12-18 MED ORDER — SODIUM CHLORIDE 0.9 % IV SOLN
2.0000 g | Freq: Once | INTRAVENOUS | Status: AC
  Administered 2022-12-18: 2 g via INTRAVENOUS
  Filled 2022-12-18: qty 12.5

## 2022-12-18 MED ORDER — ONDANSETRON HCL 4 MG/2ML IJ SOLN: 4.0000 mg | Freq: Four times a day (QID) | INTRAMUSCULAR | Status: DC | PRN

## 2022-12-18 NOTE — ED Provider Notes (Signed)
[facility] Provider Note   Event Date/Time   First MD Initiated Contact with Patient 12/18/22 1102     (approximate) History  Wound Infection  HPI [name] is a 57 y.o. 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  Patient states that he only noticed worsening shortness of breath that began yesterday as well as subjective fevers and chills that began 2 days prior to arrival.  Patient states that he has been being treated for this right lower extremity chronic wound including antibiotics that he finished 1 week prior to arrival.  EMS found patient to be febrile to 103 and a gram of Tylenol was given prior to arrival ROS: Patient currently denies any vision changes, tinnitus, difficulty speaking, facial droop, sore throat, chest pain, shortness of breath, abdominal pain, nausea/vomiting/diarrhea, dysuria, or weakness/numbness/paresthesias in any extremity   Physical Exam  Triage Vital Signs: ED Triage Vitals [12/18/22 1056]  Enc Vitals Group     BP      Pulse      Resp      Temp      Temp src      SpO2 92 %     Weight      Height      Head Circumference      Peak Flow      Pain Score      Pain Loc      Pain Edu?      Excl. in GC?    Most recent vital signs: Vitals:   12/19/22 1521 12/19/22 2024  BP: 119/71 (!) 112/57  Pulse: 81 78  Resp: 19 18  Temp: 98 F (36.7 C) 98.5 F (36.9 C)  SpO2: 97% 100%   General: Awake, oriented x4.  Diaphoretic CV:  Good peripheral perfusion.  Resp:  Normal effort.  Abd:  No distention.  Other:  Middle-aged obese Caucasian male laying in bed in no acute distress.  Midfoot amputation to the right lower extremity with erythema on top of the distal first metatarsal.  Baseline  sensation to bilateral lower extremities ED Results / Procedures / Treatments  Labs (all labs ordered are listed, but only abnormal results are displayed) Labs Reviewed  COMPREHENSIVE METABOLIC PANEL - Abnormal; Notable for the following components:      Result Value   CO2 20 (*)    Glucose, Bld 175 (*)    Calcium 8.6 (*)    Total Bilirubin 1.8 (*)    All other components within normal limits  LACTIC ACID, PLASMA - Abnormal; Notable for the following components:   Lactic Acid, Venous 2.8 (*)    All other components within normal limits  CBC WITH DIFFERENTIAL/PLATELET - Abnormal; Notable for the following components:   Platelets 143 (*)    Neutro Abs 7.9 (*)    Lymphs Abs 0.4 (*)    All other components within normal limits  PROTIME-INR - Abnormal; Notable for the following components:   Prothrombin Time 15.9 (*)    INR 1.3 (*)    All other components within normal limits  URINALYSIS, ROUTINE W REFLEX MICROSCOPIC - Abnormal; Notable for the following components:   Color, Urine YELLOW (*)    APPearance HAZY (*)    Hgb urine dipstick SMALL (*)  Ketones, ur 20 (*)    Protein, ur >=300 (*)    All other components within normal limits  BRAIN NATRIURETIC PEPTIDE - Abnormal; Notable for the following components:   B Natriuretic Peptide 125.4 (*)    All other components within normal limits  PROTIME-INR - Abnormal; Notable for the following components:   Prothrombin Time 16.5 (*)    INR 1.3 (*)    All other components within normal limits  BASIC METABOLIC PANEL - Abnormal; Notable for the following components:   Sodium 134 (*)    CO2 21 (*)    Glucose, Bld 305 (*)    Calcium 7.6 (*)    All other components within normal limits  CBC - Abnormal; Notable for the following components:   Platelets 125 (*)    All other components within normal limits  GLUCOSE, CAPILLARY - Abnormal; Notable for the following components:   Glucose-Capillary 327 (*)    All other components within  normal limits  GLUCOSE, CAPILLARY - Abnormal; Notable for the following components:   Glucose-Capillary 230 (*)    All other components within normal limits  GLUCOSE, CAPILLARY - Abnormal; Notable for the following components:   Glucose-Capillary 245 (*)    All other components within normal limits  PHOSPHORUS - Abnormal; Notable for the following components:   Phosphorus 2.1 (*)    All other components within normal limits  GLUCOSE, CAPILLARY - Abnormal; Notable for the following components:   Glucose-Capillary 288 (*)    All other components within normal limits  GLUCOSE, CAPILLARY - Abnormal; Notable for the following components:   Glucose-Capillary 336 (*)    All other components within normal limits  GLUCOSE, CAPILLARY - Abnormal; Notable for the following components:   Glucose-Capillary 237 (*)    All other components within normal limits  TROPONIN I (HIGH SENSITIVITY) - Abnormal; Notable for the following components:   Troponin I (High Sensitivity) 18 (*)    All other components within normal limits  CULTURE, BLOOD (ROUTINE X 2)  CULTURE, BLOOD (ROUTINE X 2)  RESP PANEL BY RT-PCR (RSV, FLU A&B, COVID)  [id number]  AEROBIC/ANAEROBIC CULTURE W GRAM STAIN (SURGICAL/DEEP WOUND)  LACTIC ACID, PLASMA  APTT  PROCALCITONIN  MAGNESIUM  LYME DISEASE SEROLOGY W/REFLEX  SPOTTED FEVER GROUP ANTIBODIES  BASIC METABOLIC PANEL  CBC  MAGNESIUM  PHOSPHORUS   EKG ED ECG REPORT I, Merwyn Katos, the attending physician, personally viewed and interpreted this ECG. Date: 12/18/2022 EKG Time: 1102 Rate: 130 Rhythm: Tachycardic sinus rhythm QRS Axis: normal Intervals: normal ST/T Wave abnormalities: normal Narrative Interpretation: Tachycardic sinus rhythm.  No evidence of acute ischemia RADIOLOGY ED MD interpretation: X-ray of the right foot independently interpreted by me shows no evidence of acute osteomyelitis -Agree with radiology assessment Official radiology report(s): No  results found. PROCEDURES: Critical Care performed: Yes, see critical care procedure note(s) .1-3 Lead EKG Interpretation  Performed by: Merwyn Katos, MD Authorized by: Merwyn Katos, MD     Interpretation: normal     ECG rate:  71   ECG rate assessment: normal     Rhythm: sinus rhythm     Ectopy: none     Conduction: normal    MEDICATIONS ORDERED IN ED: Medications  acetaminophen (TYLENOL) tablet 650 mg (650 mg Oral Given 12/18/22 2202)    Or  acetaminophen (TYLENOL) suppository 650 mg ( Rectal See Alternative 12/18/22 2202)  ondansetron (ZOFRAN) tablet 4 mg (has no administration in time range)    Or  ondansetron [facility]) injection 4 mg (has no administration in time range)  heparin injection 5,000 Units (5,000 Units Subcutaneous Given 12/19/22 1320)  senna-docusate (Senokot-S) tablet 1 tablet (has no administration in time range)  insulin aspart (novoLOG) injection 0-5 Units (2 Units Subcutaneous Given 12/18/22 2035)  insulin aspart (novoLOG) injection 0-15 Units (11 Units Subcutaneous Given 12/19/22 1637)  polyethylene glycol (MIRALAX / GLYCOLAX) packet 17 g (has no administration in time range)  vancomycin (VANCOREADY) IVPB 1250 mg/250 mL (0 mg Intravenous Stopped 12/19/22 1322)  ceFEPIme (MAXIPIME) 2 g in sodium chloride 0.9 % 100 mL IVPB (2 g Intravenous New Bag/Given 12/19/22 2022)  melatonin tablet 5 mg (5 mg Oral Given 12/18/22 2035)  azithromycin (ZITHROMAX) 500 mg in sodium chloride 0.9 % 250 mL IVPB (500 mg Intravenous New Bag/Given 12/19/22 1454)  albuterol (PROVENTIL) (2.5 MG/3ML) 0.083% nebulizer solution 2.5 mg (has no administration in time range)  oxyCODONE (Oxy IR/ROXICODONE) immediate release tablet 5 mg (5 mg Oral Given 12/19/22 0020)  atorvastatin (LIPITOR) tablet 20 mg (20 mg Oral Given 12/18/22 2034)  irbesartan (AVAPRO) tablet 75 mg (75 mg Oral Given 12/18/22 2034)  tamsulosin (FLOMAX) capsule 0.4 mg (0.4 mg Oral Given 12/19/22 1009)  multivitamin with minerals  tablet (1 tablet Oral Given 12/19/22 1008)  aspirin EC tablet 81 mg (has no administration in time range)  morphine (PF) 2 MG/ML injection 2 mg (has no administration in time range)  calcium carbonate (TUMS - dosed in mg elemental calcium) chewable tablet 200 mg of elemental calcium (200 mg of elemental calcium Oral Given 12/18/22 2202)  insulin glargine-yfgn (SEMGLEE) injection 10 Units (10 Units Subcutaneous Given 12/19/22 1009)  acyclovir (ZOVIRAX) 200 MG capsule 400 mg (400 mg Oral Given 12/19/22 1221)  gabapentin (NEURONTIN) capsule 100 mg (100 mg Oral Given 12/19/22 1638)  diphenhydrAMINE (BENADRYL) capsule 25 mg (has no administration in time range)  phosphorus (K PHOS NEUTRAL) tablet 500 mg (500 mg Oral Given 12/19/22 1638)  oxyCODONE (Oxy IR/ROXICODONE) immediate release tablet 5 mg (5 mg Oral Given 12/19/22 1452)  sodium chloride 0.9 % bolus 1,000 mL (0 mLs Intravenous Stopped 12/18/22 1212)    And  sodium chloride 0.9 % bolus 1,000 mL (0 mLs Intravenous Stopped 12/18/22 1212)  ceFEPIme (MAXIPIME) 2 g in sodium chloride 0.9 % 100 mL IVPB (0 g Intravenous Stopped 12/18/22 1159)  metroNIDAZOLE (FLAGYL) IVPB 500 mg (0 mg Intravenous Stopped 12/18/22 1231)  diphenhydrAMINE (BENADRYL) injection 12.5 mg (12.5 mg Intravenous Given 12/18/22 1128)  vancomycin (VANCOCIN) IVPB 1000 mg/200 mL premix (0 mg Intravenous Stopped 12/18/22 1345)    Followed by  vancomycin (VANCOCIN) IVPB 1000 mg/200 mL premix (0 mg Intravenous Stopped 12/19/22 0015)  gadobutrol (GADAVIST) 1 MMOL/ML injection 10 mL (10 mLs Intravenous Contrast Given 12/18/22 1903)   IMPRESSION / MDM / ASSESSMENT AND PLAN / ED COURSE  I reviewed the triage vital signs and the nursing notes.                             The patient is on the cardiac monitor to evaluate for evidence of arrhythmia and/or significant heart rate changes. Patient's presentation is most consistent with acute presentation with potential threat to life or bodily  function. The Pt presents with right foot wound highly concerning for sepsis (suspected skin and soft tissue infection versus osteomyelitis source). At this time, the Pt is satting well on room air, normotensive, and appears HDS.  Will start empiric antibiotics and  fluids.  Due to lactic acidosis, will administer fluids gradually with frequent reassessment. Have low suspicion for a GI, or CNS source at this time, but will reconsider if initial workup is unremarkable.  - CBC, BMP, LFTs - VBG - UA - BCx x2, Lactate - EKG  - Empiric Abx: Vancomycin, cefepime, Flagyl - Fluids: 1 L NS  Dispo: Admit to medicine   FINAL CLINICAL IMPRESSION(S) / ED DIAGNOSES   Final diagnoses:  None   Rx / DC Orders   ED Discharge Orders     None      Note:  This document was prepared using Dragon voice recognition software and may include unintentional dictation errors.   Merwyn Katos, MD 12/19/22 [phone]

## 2022-12-18 NOTE — Assessment & Plan Note (Signed)
-   This complicates overall care and prognosis.  

## 2022-12-18 NOTE — Assessment & Plan Note (Addendum)
Increase heart rate, elevated lactic acid at 2.8, tmax of 103 with EMS (per patient), with possible source of infection of right foot wound/possible right foot cellulitis at site of prior amputation and/or atypical pneumonia Workup in progress at this time, sepsis cannot be excluded Blood cultures x 2, spotted fever group, Lyme disease serology were ordered by EDP and are in process Procalcitonin added, if elevated we will add azithromycin for atypical coverage Patient is maintaining appropriate MAP, no further fluid bolus indicated at this time Admit to telemetry medical, inpatient

## 2022-12-18 NOTE — ED Triage Notes (Signed)
Pt BIB [facility] from home for R foot wound. Finished abx about a week ago.   1 G Tylenol en route.

## 2022-12-18 NOTE — Consult Note (Signed)
Pharmacy Antibiotic Note  [name] is a 57 y.o. male admitted on 12/18/2022 with R foot infection. PMH significant for T2DM, HTN, depression, history of R foot infection (s/p TMA). Patient presented from podiatry after worsening R foot infection despite outpatient antibiotic regimen (clindamycin). Patient has documented intolerance to vancomycin (vancomycin-related infusion reaction). Pharmacy has been consulted for vancomycin and cefepime dosing.  Plan: Day 1 of antibiotics Patient completed vancomycin 2000 mg IV x1 in ED. Start vancomycin 1250 mg IV Q12H. Goal AUC 400-550. Double vancomycin infusion time given history of vancomycin-related infusion syndrome. Expected AUC: 429.5 Expected Css min: 12.2 SCr used: 0.94  Weight used: IBW, Vd used: 0.72 (BMI 32.8) Continue cefepime 2 g IV Q8H Continue to monitor renal function and follow culture results  Height:  (177.8 cm) Weight: 103.9 kg (229 lb) IBW/kg (Calculated) : 73 kg  Temp (24hrs), Avg:100.5 F (38.1 C), Min:98.6 F (37 C), Max:103 F (39.4 C)  Recent Labs  Lab 12/18/22 1100 12/18/22 1257  WBC 9.2  --   CREATININE 0.94  --   LATICACIDVEN 2.8* 1.5    Estimated Creatinine Clearance: 103.5 mL/min (by C-G formula based on SCr of 0.94 mg/dL).    Allergies  Allergen Reactions   Vancomycin Itching    Other reaction(s): Other (See Comments) Red mans    Antimicrobials this admission: 4/21 Vancomycin >>  4/21 Cefepime >>  4/21 Metronidazole x1   Dose adjustments this admission: N/A  Microbiology results: 4/21 BCx: IP  Thank you for allowing pharmacy to be a part of this patient's care.  Celene Squibb, PharmD PGY1 Pharmacy Resident 12/18/2022 2:02 PM

## 2022-12-18 NOTE — Assessment & Plan Note (Signed)
POA Continue with cefepime per pharmacy, added azithromycin for atypical coverage Incentive spirometry and flutter valve Albuterol nebulizer q6h prn for shortness of breath and wheezing ordered

## 2022-12-18 NOTE — Hospital Course (Addendum)
Mr. 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  Vitals in the ED showed temperature of 99.8, respiration rate of 20, heart rate of 129, blood pressure 127/54, SpO2 of 92% on room air.  Serum sodium is 137, potassium 3.8, chloride of 105, bicarb 20, BUN of 15, serum creatinine 0.94, nonfasting blood glucose 175, WBC 9.2, hemoglobin 15.6, platelets of 143, EGFR greater than 60.  BNP was mildly elevated at 125.4.  Lactic acid was initially 2.8 and improved to 1.5 after fluid and antibiotics. High since troponin was 18.   UA was negative for leukocytes and nitrates.  COVID/influenza A/influenza B/RSV PCR were negative.  ED treatment: Acetaminophen 650 mg p.o. one-time dose, Benadryl 12.5 mg IV one-time dose, cefepime, metronidazole, vancomycin, EDP ordered sepsis bolus 3 L however this was stopped prematurely and patient received 1.2 L sodium chloride bolus per ED nursing note.

## 2022-12-18 NOTE — H&P (Addendum)
History and Physical   [name] ZOX:[id number] DOB: 05/16/1966 DOA: 12/18/2022  PCP: Sherlene Shams, MD  Outpatient Specialists: Dr. Richardo Hanks, urology Patient coming from: home  I have personally briefly reviewed patient's old medical records in [facility] EMR.  Chief Concern: right foot wound infection  HPI: Mr. 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  Vitals in the ED showed temperature of 99.8, respiration rate of 20, heart rate of 129, blood pressure 127/54, SpO2 of 92% on room air.  Serum sodium is 137, potassium 3.8, chloride of 105, bicarb 20, BUN of 15, serum creatinine 0.94, nonfasting blood glucose 175, WBC 9.2, hemoglobin 15.6, platelets of 143, EGFR greater than 60.  BNP was mildly elevated at 125.4.  Lactic acid was initially 2.8 and improved to 1.5 after fluid and antibiotics. High since troponin was 18.   UA was negative for leukocytes and nitrates.  COVID/influenza A/influenza B/RSV PCR were negative.  ED treatment: Acetaminophen 650 mg p.o. one-time dose, Benadryl 12.5 mg IV one-time dose, cefepime, metronidazole, vancomycin, EDP ordered sepsis bolus 3 L however this was stopped prematurely and patient received 1.2 L sodium chloride bolus per ED nursing note. --------------------------- At bedside, patient is able to tell me his name, age, location, current calendar year.  Patient has flat affect.   He reports he does not know when the ulcers in his foot had restarted.  He endorses that he was prescribed antibiotics on 12/01/2022 and completed the prescribed antibiotic as appropriate on 12/11/2022.  He reports the infection did not improve, prompting him to come to the emergency  department for further evaluation.  He denies fever at home, chest pain, abdominal pain, dysuria, hematuria, diarrhea, swelling of his lower extremities, loss of consciousness, vision changes.  He reports that over the last few days he has had some shortness of breath which started about one-two weeks ago and some cough.  He denies known sick contacts.  Report that when EMS came, they checked his temperature and he had a fever of 103 Fahrenheit.  He was given 2 Tylenol on rounds.  Social history: Lives at home with his wife.  He denies tobacco, EtOH, recreational drug use.  He was previously working in the [employer].  ROS: Constitutional: no weight change, + fever ENT/Mouth: no sore throat, no rhinorrhea Eyes: no eye pain, no vision changes Cardiovascular: no chest pain, + dyspnea,  no edema, no palpitations Respiratory: + cough, no sputum, no wheezing Gastrointestinal: no nausea, no vomiting, no diarrhea, no constipation Genitourinary: no urinary incontinence, no dysuria, no hematuria Musculoskeletal: no arthralgias, no myalgias Skin: + skin lesions, + right foot ulcer Neuro: + weakness, no loss of consciousness, no syncope Psych: no anxiety, no depression, no decrease appetite Heme/Lymph: no bruising, no bleeding  ED Course: Discussed with emergency medicine provider, patient requiring hospitalization for chief concerns of right foot infection, with likely cellulitis failing outpatient therapy and new onset shortness of breath.  Assessment/Plan  Principal Problem:   Diabetic ulcer of foot associated with diabetes mellitus due to underlying condition, with fat layer exposed Active Problems:   Sepsis due to cellulitis   Atypical pneumonia   OSA (obstructive sleep apnea)   Obesity (BMI 30-39.9)   Hyperlipidemia associated with type 2 diabetes mellitus   Polyneuropathy, peripheral sensorimotor axonal  S/P transmetatarsal amputation of foot, right   Major depressive disorder, recurrent    Constipation   Diabetes mellitus type 2, noninsulin dependent   Assessment and Plan:  * Diabetic ulcer of foot associated with diabetes mellitus due to underlying condition, with fat layer exposed Right foot with history of transmetatarsal amputation Patient was prescribed clindamycin 150 mg p.o. 3 times daily for 10 days patient completed antibiotic on 12/11/2022 Patient sent from wound clinic for failing outpatient antibiotics Continue cefepime and vancomycin for wound infection at this time MRI of the right foot with contrast has been ordered on admission Dr. Excell Seltzer, unassigned podiatry consultation placed via secure chat and epic order. Dr. Excell Seltzer states 'will see the patient'. Symptomatic support: Oxycodone 5 mg p.o. every 6 hours as needed for moderate pain, 18 hours ordered; morphine 2 mg IV every 4 hours as needed for severe pain, 18 hours ordered  Atypical pneumonia POA Continue with cefepime per pharmacy, added azithromycin for atypical coverage Incentive spirometry and flutter valve Albuterol nebulizer q6h prn for shortness of breath and wheezing ordered  Sepsis due to cellulitis Increase heart rate, elevated lactic acid at 2.8, tmax of 103 with EMS (per patient), with possible source of infection of right foot wound/possible right foot cellulitis at site of prior amputation and/or atypical pneumonia Workup in progress at this time, sepsis cannot be excluded Blood cultures x 2, spotted fever group, Lyme disease serology were ordered by EDP and are in process Procalcitonin added, if elevated we will add azithromycin for atypical coverage Patient is maintaining appropriate MAP, no further fluid bolus indicated at this time Admit to telemetry medical, inpatient  Diabetes mellitus type 2, noninsulin dependent Home metformin 500 mg daily not resumed on admission Insulin SSI with at bedtime coverage ordered Goal inpatient blood glucose levels 140-180  Constipation Senna docusate  nightly as needed for mild constipation ordered, GlycoLax 17 g, p.o., twice daily as needed for moderate constipation  Major depressive disorder, recurrent Patient does not take antidepression medications  Hyperlipidemia associated with type 2 diabetes mellitus Atorvastatin 20 qhs resumed  Obesity (BMI 30-39.9) This complicates overall care and prognosis.   OSA (obstructive sleep apnea) Patient cannot tolerate CPAP machine Counseled patient on obtaining referral to sleep medicine for a nasal CPAP machine versus possible for procedural intervention to address his sleep apnea I discussed that chronic sleep apnea means that patient is chronically hypoxic when sleeping at night which would affect increase his morbidity and mortality, including developing pulmonary hypertension, heart failure, risk of stroke, risk of death Patient endorses understanding and compliance at bedside Patient declined CPAP mask in the hospital  Chart reviewed.   DVT prophylaxis: Heparin 5000 units subcutaneous starting on 12/18/2022 at 2200 Code Status: Full code Diet: Heart healthy/carb modified Family Communication: Updated spouse at bedside with patient's permission Disposition Plan: Pending clinical course Consults called: Podiatry Admission status: Telemetry medical, inpatient  Past Medical History:  Diagnosis Date   Acute prostatitis without hematuria 05/20/2022   Allergy    Complication of anesthesia    pt states at [facility] he stopped breathing due to his sleep apnea-hard to wake up   COVID-19    Depression    Diabetic ulcer of both feet    DM (diabetes mellitus), type 2    History of kidney stones    History of methicillin resistant staphylococcus aureus (MRSA) 09/2020   foot   Neuromuscular disorder    neuropathy of back and bilateral feet   Obesity  Sleep apnea    does not use cpap   Past Surgical History:  Procedure Laterality Date   AMPUTATION Left    toe amputation    CYSTOSCOPY/URETEROSCOPY/HOLMIUM LASER/STENT PLACEMENT Right 07/29/2022   Procedure: CYSTOSCOPY/URETEROSCOPY/HOLMIUM LASER/STENT PLACEMENT;  Surgeon: Sondra Come, MD;  Location: [facility] ORS;  Service: Urology;  Laterality: Right;   FOOT SURGERY Right 09/2012   cyst removed   SPINE SURGERY  2004   nerve damage in spine   TOE AMPUTATION Right 06/30/2016   5 toes   Social History:  reports that he has never smoked. He has never been exposed to tobacco smoke. He has never used smokeless tobacco. He reports that he does not drink alcohol and does not use drugs.  Allergies  Allergen Reactions   Vancomycin Itching    Other reaction(s): Other (See Comments) Red mans   Family History  Problem Relation Age of Onset   Heart disease Father    Stroke Father    Heart attack Father 56   Heart disease Maternal Grandmother    Stroke Maternal Grandmother    Heart disease Paternal Grandfather    Stroke Paternal Grandfather    Family history: Family history reviewed and not pertinent.  Prior to Admission medications   Medication Sig Start Date End Date Taking? Authorizing Provider  aspirin EC 81 MG tablet Take 81 mg by mouth daily. Swallow whole.    [provider]  atorvastatin (LIPITOR) 20 MG tablet TAKE 1 TABLET BY MOUTH EVERY DAY 06/20/22   Sherlene Shams, MD  DULoxetine (CYMBALTA) 30 MG capsule TAKE 1 CAPSULE BY MOUTH EVERY DAY 04/26/22   Sherlene Shams, MD  metFORMIN (GLUCOPHAGE-XR) 500 MG 24 hr tablet TAKE 1 TABLET BY MOUTH EVERY DAY WITH BREAKFAST 10/27/21   Sherlene Shams, MD  Multiple Vitamins-Minerals (MULTIVITAMIN WITH MINERALS) tablet Take 1 tablet by mouth daily.    [provider]  oxyCODONE (ROXICODONE) 5 MG immediate release tablet Take 1.5 tablets (7.5 mg total) by mouth daily. As needed for severe pain 11/29/22   Sherlene Shams, MD  telmisartan (MICARDIS) 20 MG tablet Take 1 tablet (20 mg total) by mouth at bedtime. 10/04/22   Sherlene Shams, MD   Physical  Exam: Vitals:   12/18/22 1230 12/18/22 1300 12/18/22 1330 12/18/22 1430  BP: 122/62 123/66 130/74 122/70  Pulse: 96 96 95 100  Resp: 17  16 14   Temp:   98.6 F (37 C)   TempSrc:   Oral   SpO2: 96% 93% 97% 94%  Weight:      Height:       Constitutional: appears older than chronological age, frail, chronically ill, NAD , calm Eyes: PERRL, lids and conjunctivae normal ENMT: Mucous membranes are moist. Posterior pharynx clear of any exudate or lesions. Age-appropriate dentition. Hearing appropriate Neck: normal, supple, no masses, no thyromegaly Respiratory: clear to auscultation bilaterally, no wheezing, no crackles. Normal respiratory effort. No accessory muscle use.  Cardiovascular: Regular rate and rhythm, no murmurs / rubs / gallops. No extremity edema. 2+ pedal pulses. No carotid bruits.  Abdomen: Morbidly obese abdomen, no tenderness, no masses palpated, no hepatosplenomegaly. Bowel sounds positive.  Musculoskeletal: no clubbing / cyanosis. No joint deformity upper and lower extremities. Good ROM, no contractures, no atrophy. Normal muscle tone.  Skin: Right lateral medial foot ulcer, approximately 2 cm in diameter with warmth.  2 lesions at the plantar surface on the right foot.      Neurologic: Sensation intact. Strength 5/5  in all 4.  Psychiatric: Normal judgment and insight. Alert and oriented x 3.  Depressed mood.  Patient has flat affect, and frequently averts his eyes instead of having eye contact.  EKG: independently reviewed, showing sinus tachycardia with rate of 130, QTc 472  Chest x-ray on Admission: I personally reviewed and I agree with radiologist reading as below.  DG Foot Complete Right  Result Date: 12/18/2022 CLINICAL DATA:  Wound the patient's stump. EXAM: RIGHT FOOT COMPLETE - 3+ VIEW COMPARISON:  Foot radiographs dated 02/12/2013. FINDINGS: The patient is status post a forefoot resection at the level of the proximal metatarsals. There is no evidence of  fracture or dislocation. No focal osseous demineralization to suggest osteomyelitis. Degenerative changes are seen in the midfoot as well as a plantar calcaneal enthesophyte. There is soft tissue swelling around the foot. IMPRESSION: No radiographic evidence of osteomyelitis. Electronically Signed   By: Romona Curls M.D.   On: 12/18/2022 11:58   DG Chest Port 1 View  Result Date: 12/18/2022 CLINICAL DATA:  Sepsis EXAM: PORTABLE CHEST 1 VIEW COMPARISON:  None Available. FINDINGS: The heart is enlarged. Mild diffuse bilateral interstitial opacities are noted. There is no pleural effusion or pneumothorax. The osseous structures appear intact. IMPRESSION: 1. Mild diffuse bilateral interstitial opacities may represent pulmonary edema or atypical infection. 2. Cardiomegaly. Electronically Signed   By: Romona Curls M.D.   On: 12/18/2022 11:56    Labs on Admission: I have personally reviewed following labs  CBC: Recent Labs  Lab 12/18/22 1100  WBC 9.2  NEUTROABS 7.9*  HGB 15.6  HCT 47.0  MCV 90.4  PLT 143*   Basic Metabolic Panel: Recent Labs  Lab 12/18/22 1100  NA 137  K 3.8  CL 105  CO2 20*  GLUCOSE 175*  BUN 15  CREATININE 0.94  CALCIUM 8.6*   GFR: Estimated Creatinine Clearance: 103.5 mL/min (by C-G formula based on SCr of 0.94 mg/dL).  Liver Function Tests: Recent Labs  Lab 12/18/22 1100  AST 41  ALT 36  ALKPHOS 50  BILITOT 1.8*  PROT 7.7  ALBUMIN 3.8   Coagulation Profile: Recent Labs  Lab 12/18/22 1100  INR 1.3*   Urine analysis:    Component Value Date/Time   COLORURINE YELLOW (A) 12/18/2022 1133   APPEARANCEUR HAZY (A) 12/18/2022 1133   APPEARANCEUR Cloudy (A) 08/03/2022 1513   LABSPEC 1.024 12/18/2022 1133   PHURINE 5.0 12/18/2022 1133   GLUCOSEU NEGATIVE 12/18/2022 1133   GLUCOSEU NEGATIVE 09/21/2022 0736   HGBUR SMALL (A) 12/18/2022 1133   BILIRUBINUR NEGATIVE 12/18/2022 1133   BILIRUBINUR Negative 08/03/2022 1513   KETONESUR 20 (A) 12/18/2022  1133   PROTEINUR >=300 (A) 12/18/2022 1133   UROBILINOGEN 0.2 09/21/2022 0736   NITRITE NEGATIVE 12/18/2022 1133   LEUKOCYTESUR NEGATIVE 12/18/2022 1133   This document was prepared using Dragon Voice Recognition software and may include unintentional dictation errors.  Dr. Sedalia Muta [facility]  If 7PM-7AM, please contact overnight-coverage provider If 7AM-7PM, please contact day attending provider [email or web address]  12/18/2022, 3:03 PM

## 2022-12-18 NOTE — Assessment & Plan Note (Signed)
Atorvastatin 20 qhs resumed

## 2022-12-18 NOTE — Consult Note (Signed)
CODE SEPSIS - PHARMACY COMMUNICATION  **Broad Spectrum Antibiotics should be administered within 1 hour of Sepsis diagnosis**  Time Code Sepsis Called/Page Received: 1107  Antibiotics Ordered: cefepime, vancomycin, and Flagyl  Time of 1st antibiotic administration: 1121  Additional action taken by pharmacy: N/A  Barrie Folk ,PharmD Clinical Pharmacist  12/18/2022  11:29 AM

## 2022-12-18 NOTE — Assessment & Plan Note (Addendum)
Senna docusate nightly as needed for mild constipation ordered, GlycoLax 17 g, p.o., twice daily as needed for moderate constipation

## 2022-12-18 NOTE — Assessment & Plan Note (Addendum)
Right foot with history of transmetatarsal amputation Patient was prescribed clindamycin 150 mg p.o. 3 times daily for 10 days patient completed antibiotic on 12/11/2022 Patient sent from wound clinic for failing outpatient antibiotics Continue cefepime and vancomycin for wound infection at this time MRI of the right foot with contrast has been ordered on admission Dr. Excell Seltzer, unassigned podiatry consultation placed via secure chat and epic order. Dr. Excell Seltzer states 'will see the patient'. Symptomatic support: Oxycodone 5 mg p.o. every 6 hours as needed for moderate pain, 18 hours ordered; morphine 2 mg IV every 4 hours as needed for severe pain, 18 hours ordered

## 2022-12-18 NOTE — Consult Note (Signed)
PHARMACY -  BRIEF ANTIBIOTIC NOTE   Pharmacy has received consult(s) for vancomycin and cefepime from an ED provider.  The patient's profile has been reviewed for ht/wt/allergies/indication/available labs.    One time order(s) placed for Vancomycin 2 grams IV x 1 and cefepime 2 grams IV x 1  Further antibiotics/pharmacy consults should be ordered by admitting physician if indicated.                       Thank you, Barrie Folk, PharmD 12/18/2022  11:28 AM

## 2022-12-18 NOTE — Assessment & Plan Note (Deleted)
Right foot with history of transmetatarsal amputation Patient was prescribed clindamycin 150 mg p.o. 3 times daily for 10 days patient completed antibiotic on 12/11/2022 Patient sent from wound clinic for failing outpatient antibiotics Continue cefepime and vancomycin for wound infection at this time MRI of the right foot with contrast has been ordered on admission Dr. Excell Seltzer, unassigned podiatry consultation placed via secure chat and epic order. Dr. Excell Seltzer states 'will see the patient'.

## 2022-12-18 NOTE — Assessment & Plan Note (Signed)
Patient cannot tolerate CPAP machine Counseled patient on obtaining referral to sleep medicine for a nasal CPAP machine versus possible for procedural intervention to address his sleep apnea I discussed that chronic sleep apnea means that patient is chronically hypoxic when sleeping at night which would affect increase his morbidity and mortality, including developing pulmonary hypertension, heart failure, risk of stroke, risk of death Patient endorses understanding and compliance at bedside Patient declined CPAP mask in the hospital

## 2022-12-18 NOTE — Consult Note (Signed)
WOC Nurse Consult Note: Reason for Consult:Full thickness wounds to right lateral foot and plantar aspect of right foot in the presence of metatarsal head amputation. Podiatry is simultaneously consulted and has not yet seen. I will provide guidance for nursing for daily care. Any orders provided by podiatric medicine will supercede those I enter today. Wound type:Neuropathic Pressure Injury POA: N/A Measurement:To be obtained by Bedside RN with application of next dressing change this evening and documented on Nursing Flow Sheet (LxWxD in cm) Wound bed:See photos provided by admitting MD and uploaded to EMR  Drainage (amount, consistency, odor) Small to moderate serous Periwound: dry peeling at plantar aspect Dressing procedure/placement/frequency: I have provided Nursing with guidance for the daily care of these lesions using a soap and water cleanse, pat dry and placement of a silver hydrofiber (Aquacel Ag+ Advantage) dressing. This is to be covered with dry gauze and secured with Kerlix roll gauze/paper tape.   WOC nursing team will not follow, but will remain available to this patient, the nursing and medical teams.  Please re-consult if needed.  Thank you for inviting [place] to participate in this patient's Plan of Care.  Ladona Mow, MSN, RN, CNS, GNP, Leda Min, [employer], [employer] phone:  [phone]

## 2022-12-18 NOTE — Assessment & Plan Note (Signed)
Home metformin 500 mg daily not resumed on admission Insulin SSI with at bedtime coverage ordered Goal inpatient blood glucose levels 140-180 

## 2022-12-18 NOTE — ED Notes (Signed)
Lab called to add on Lyme and Spotted Fever.

## 2022-12-18 NOTE — ED Notes (Addendum)
Per MD, stop IVF boluses and do not give continuous. Pt received approximately 1200 mL NS total.

## 2022-12-18 NOTE — Assessment & Plan Note (Signed)
Patient does not take antidepression medications

## 2022-12-19 DIAGNOSIS — L089 Local infection of the skin and subcutaneous tissue, unspecified: Secondary | ICD-10-CM | POA: Diagnosis not present

## 2022-12-19 DIAGNOSIS — E11628 Type 2 diabetes mellitus with other skin complications: Secondary | ICD-10-CM

## 2022-12-19 DIAGNOSIS — T8789 Other complications of amputation stump: Secondary | ICD-10-CM

## 2022-12-19 DIAGNOSIS — Z794 Long term (current) use of insulin: Secondary | ICD-10-CM

## 2022-12-19 DIAGNOSIS — E08621 Diabetes mellitus due to underlying condition with foot ulcer: Secondary | ICD-10-CM

## 2022-12-19 DIAGNOSIS — L97412 Non-pressure chronic ulcer of right heel and midfoot with fat layer exposed: Secondary | ICD-10-CM | POA: Diagnosis not present

## 2022-12-19 LAB — BASIC METABOLIC PANEL
Anion gap: 5 (ref 5–15)
BUN: 14 mg/dL (ref 6–20)
CO2: 21 mmol/L — ABNORMAL LOW (ref 22–32)
Calcium: 7.6 mg/dL — ABNORMAL LOW (ref 8.9–10.3)
Chloride: 108 mmol/L (ref 98–111)
Creatinine, Ser: 0.88 mg/dL (ref 0.61–1.24)
GFR, Estimated: >60 mL/min (ref >60)
Glucose, Bld: 305 mg/dL — ABNORMAL HIGH (ref 70–99)
Potassium: 3.6 mmol/L (ref 3.5–5.1)
Sodium: 134 mmol/L — ABNORMAL LOW (ref 135–145)

## 2022-12-19 LAB — PHOSPHORUS: Phosphorus: 2.1 mg/dL — ABNORMAL LOW (ref 2.5–4.6)

## 2022-12-19 LAB — CBC
HCT: 39.5 % (ref 39.0–52.0)
Hemoglobin: 13.2 g/dL (ref 13.0–17.0)
MCH: 30.6 pg (ref 26.0–34.0)
MCHC: 33.4 g/dL (ref 30.0–36.0)
MCV: 91.6 fL (ref 80.0–100.0)
Platelets: 125 10*3/uL — ABNORMAL LOW (ref 150–400)
RBC: 4.31 MIL/uL (ref 4.22–5.81)
RDW: 14.2 % (ref 11.5–15.5)
WBC: 8 10*3/uL (ref 4.0–10.5)
nRBC: 0 % (ref 0.0–0.2)

## 2022-12-19 LAB — GLUCOSE, CAPILLARY
Glucose-Capillary: 245 mg/dL — ABNORMAL HIGH (ref 70–99)
Glucose-Capillary: 288 mg/dL — ABNORMAL HIGH (ref 70–99)
Glucose-Capillary: 336 mg/dL — ABNORMAL HIGH (ref 70–99)
Glucose-Capillary: 237 mg/dL — ABNORMAL HIGH (ref 70–99)

## 2022-12-19 LAB — PROTIME-INR
INR: 1.3 — ABNORMAL HIGH (ref 0.8–1.2)
Prothrombin Time: 16.5 seconds — ABNORMAL HIGH (ref 11.4–15.2)

## 2022-12-19 LAB — MAGNESIUM: Magnesium: 2 mg/dL (ref 1.7–2.4)

## 2022-12-19 MED ORDER — DIPHENHYDRAMINE HCL 25 MG PO CAPS: 25.0000 mg | ORAL_CAPSULE | Freq: Four times a day (QID) | ORAL | Status: DC | PRN

## 2022-12-19 MED ORDER — K PHOS MONO-SOD PHOS DI & MONO 155-852-130 MG PO TABS
500.0000 mg | ORAL_TABLET | Freq: Three times a day (TID) | ORAL | Status: AC
  Administered 2022-12-19 (×2): 500 mg via ORAL
  Filled 2022-12-19 (×2): qty 2

## 2022-12-19 MED ORDER — INSULIN GLARGINE-YFGN 100 UNIT/ML ~~LOC~~ SOLN
10.0000 [IU] | Freq: Every day | SUBCUTANEOUS | Status: DC
  Administered 2022-12-19 – 2022-12-21 (×3): 10 [IU] via SUBCUTANEOUS
  Filled 2022-12-19 (×3): qty 0.1

## 2022-12-19 MED ORDER — GABAPENTIN 100 MG PO CAPS
100.0000 mg | ORAL_CAPSULE | Freq: Three times a day (TID) | ORAL | Status: DC
  Administered 2022-12-19 – 2022-12-21 (×6): 100 mg via ORAL
  Filled 2022-12-19 (×6): qty 1

## 2022-12-19 MED ORDER — ACYCLOVIR 200 MG PO CAPS
400.0000 mg | ORAL_CAPSULE | Freq: Two times a day (BID) | ORAL | Status: DC
  Administered 2022-12-19 – 2022-12-20 (×3): 400 mg via ORAL
  Filled 2022-12-19 (×3): qty 2

## 2022-12-19 MED ORDER — OXYCODONE HCL 5 MG PO TABS
5.0000 mg | ORAL_TABLET | Freq: Four times a day (QID) | ORAL | Status: DC | PRN
  Administered 2022-12-19 – 2022-12-21 (×6): 5 mg via ORAL
  Filled 2022-12-19 (×6): qty 1

## 2022-12-19 NOTE — Progress Notes (Addendum)
[facility] Progress Note  Patient: [name]    ZOX:[id number]  DOA: 12/18/2022     Date of Service: the patient was seen and examined on 12/19/2022  Chief Complaint  Patient presents with   Wound Infection   Brief hospital course: Mr. 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  ED w/up: Tmax 103, heart rate 132 tachycardiac due to febrile illness, BP stable. BNP 125, slightly elevated, troponin 18, slightly elevated, lactic acid 2.8--1.5 improved, procalcitonin 0.87 slightly elevated, WBC count within normal range Blood culture pending MRI Righ Foot: 1. Apparent skin ulceration along the plantar foot at the level of the medial tarsometatarsal joint with surrounding cellulitis. No focal abscess or other fluid collection identified. 2. No evidence of osteomyelitis or septic arthropathy. Postsurgical changes related to previous transmetatarsal amputation. 3. Mild midfoot degenerative changes with partial ankylosis between the cuneiform bones and the 1st through 3rd metatarsal bases.   Assessment and Plan:  # Sepsis secondary to cellulitis of right foot Diabetic foot ulcer, failed outpatient antibiotics MRI as above, ruled out osteomyelitis Continue as needed medication for pain control Continue cefepime and vancomycin, pharmacy consulted for dosing and trough level monitoring Follow-up podiatry  # Community-acquired pneumonia CXR  Mild diffuse bilateral interstitial opacities may represent pulmonary edema or atypical infection. Clinically patient is not having significant symptoms Continue current antibiotics, azithromycin was added to cover atypical infection   # Hypertension,  hyperlipidemia Continue irbesartan, Lipitor home medications Monitor BP and titrate medications accordingly   # NIDDM T2, held home medications for now Continue NovoLog sliding scale, monitor FSBG, continue diabetic diet Started Semglee 10 units daily # Peripheral neuropathy, started gabapentin 100 mg p.o. 3 times daily   # Hypophosphatemia, Phos repleted. Monitor electrolytes and replete as needed.  # History of left orbital herpes Started acyclovir 400 mg p.o. twice daily for prophylaxis  # BPH, continued Flomax # Depression, continue Cymbalta   Body mass index is 32.86 kg/m.  Interventions:       Diet: Diabetic diet DVT Prophylaxis: Subcutaneous Heparin    Advance goals of care discussion: Full code  Family Communication: family was not present at bedside, at the time of interview.  The pt provided permission to discuss medical plan with the family. Opportunity was given to ask question and all questions were answered satisfactorily.   Disposition:  Pt is from Home, admitted with right foot cellulitis, still on IV antibiotics, which precludes a safe discharge. Discharge to home, when cleared by podiatry.  Subjective: No significant events overnight, patient has no pain in the right foot secondary to neuropathy, patient stated that he was feeling chills at home that is why he came to the ED, no any other complaints.  Denies any chest pain or palpitations, no shortness of breath.  Resting comfortably. Patient was requesting prophylaxis for herpes affecting on the left side of eye and forehead, patient used to take acyclovir p.o.  Physical Exam: General: NAD, lying comfortably Appear in no distress, affect appropriate Eyes: PERRLA ENT: Oral Mucosa Clear, moist  Neck: no JVD,  Cardiovascular: S1 and S2 Present, no Murmur,  Respiratory: good respiratory effort, Bilateral Air entry equal and Decreased, no Crackles, no wheezes Abdomen: Bowel Sound present, Soft and no  tenderness,  Skin: no rashes Extremities: no Pedal  edema, no calf tenderness, Right foot erythematous and mild swelling, s/p TMA Neurologic: without any new focal findings Gait not checked due to patient safety concerns  Vitals:   12/18/22 2020 12/19/22 0019 12/19/22 0458 12/19/22 0720  BP: 130/77 (!) 107/57 112/61 116/64  Pulse: 86 82 73 65  Resp: Temp: 98.7 F (37.1 C) 98.7 F (37.1 C) 98.1 F (36.7 C) 97.8 F (36.6 C)  TempSrc:   Oral Oral  SpO2: 100% 97% 98% 99%  Weight:      Height:        Intake/Output Summary (Last 24 hours) at 12/19/2022 1315 Last data filed at 12/19/2022 0900 Gross per 24 hour  Intake 870 ml  Output --  Net 870 ml   Filed Weights   12/18/22 1103  Weight: 103.9 kg    Data Reviewed: I have personally reviewed and interpreted daily labs, tele strips, imagings as discussed above. I reviewed all nursing notes, pharmacy notes, vitals, pertinent old records I have discussed plan of care as described above with RN and patient/family.  CBC: Recent Labs  Lab 12/18/22 1100 12/19/22 0600  WBC 9.2 8.0  NEUTROABS 7.9*  --   HGB 15.6 13.2  HCT 47.0 39.5  MCV 90.4 91.6  PLT 143* 125*   Basic Metabolic Panel: Recent Labs  Lab 12/18/22 1100 12/19/22 0600  NA 137 134*  K 3.8 3.6  CL 105 108  CO2 20* 21*  GLUCOSE 175* 305*  BUN 15 14  CREATININE 0.94 0.88  CALCIUM 8.6* 7.6*  MG  --  2.0  PHOS  --  2.1*    Studies: MR FOOT RIGHT W WO CONTRAST  Result Date: 12/19/2022 CLINICAL DATA:  Worsening diabetic foot ulcer.  Prior amputation. EXAM: MRI OF THE RIGHT FOREFOOT WITHOUT AND WITH CONTRAST TECHNIQUE: Multiplanar, multisequence MR imaging of the right foot was performed before and after the administration of intravenous contrast. CONTRAST:  10mL GADAVIST GADOBUTROL 1 MMOL/ML IV SOLN COMPARISON:  Radiographs 12/18/2022 and 02/12/2013. Right foot MRI 02/15/2013. FINDINGS: Bones/Joint/Cartilage As demonstrated on the recent  radiographs, the patient is status post transmetatarsal amputation. The remaining metatarsal bases demonstrate no suspicious marrow signal, enhancement or cortical destruction to suggest osteomyelitis. There is partial ankylosis between the cuneiform bones and the 1st through 3rd metatarsal bases. There are no significant joint effusions. Mild midfoot degenerative changes are present. There is a tubular area of signal change extending through the talar head and adjacent navicular which is probably postsurgical. This demonstrates no suspicious enhancement. The tibiotalar joint appears unremarkable. Ligaments No significant ligamentous abnormalities. Muscles and Tendons Postsurgical changes related to previous transmetatarsal amputation. Associated lower leg and hindfoot atrophy. No focal intramuscular fluid collection, abnormal enhancement or tenosynovitis. The ankle tendons appear intact. Soft tissues There is postsurgical susceptibility artifact medially in the remaining forefoot. There is an area of apparent skin ulceration along the plantar foot at the level of the medial tarsometatarsal joint. Inflammatory changes are present throughout the surrounding subcutaneous fat with T2 hyperintensity and low level enhancement. No focal fluid collections are identified. Mild nonspecific subcutaneous edema extends into the dorsal aspect of the midfoot and distal lower leg. IMPRESSION: 1. Apparent skin ulceration along the plantar foot at the level of the medial tarsometatarsal joint with surrounding cellulitis. No focal abscess or other fluid collection identified. 2. No evidence of osteomyelitis or septic arthropathy. Postsurgical changes related to previous transmetatarsal amputation. 3. Mild midfoot degenerative changes with partial ankylosis between the cuneiform bones  and the 1st through 3rd metatarsal bases. Electronically Signed   By: Carey Bullocks M.D.   On: 12/19/2022 07:44    Scheduled Meds:  acyclovir  400  mg Oral BID   [START ON 12/20/2022] aspirin EC  81 mg Oral Daily   atorvastatin  20 mg Oral QHS   gabapentin  100 mg Oral TID   heparin  5,000 Units Subcutaneous Q8H   insulin aspart  0-15 Units Subcutaneous TID WC   insulin aspart  0-5 Units Subcutaneous QHS   insulin glargine-yfgn  10 Units Subcutaneous Daily   irbesartan  75 mg Oral QHS   multivitamin with minerals   Oral Daily   phosphorus  500 mg Oral TID   tamsulosin  0.4 mg Oral Daily   Continuous Infusions:  azithromycin 500 mg (12/18/22 2122)   ceFEPime (MAXIPIME) IV 2 g (12/19/22 1228)   vancomycin 1,250 mg (12/19/22 1004)   PRN Meds: acetaminophen **OR** acetaminophen, albuterol, calcium carbonate, diphenhydrAMINE, melatonin, ondansetron **OR** ondansetron (ZOFRAN) IV, oxyCODONE, polyethylene glycol, senna-docusate  Time spent: 35 minutes  Author: Gillis Santa. MD [facility] 12/19/2022 1:15 PM  To reach On-call, see care teams to locate the attending and reach out to them via [email or web address]. If 7PM-7AM, please contact night-coverage If you still have difficulty reaching the attending provider, please page the [facility] (Director on Call) for [facility] on amion for assistance.

## 2022-12-19 NOTE — Consult Note (Signed)
PODIATRY / FOOT AND ANKLE SURGERY CONSULTATION NOTE  Requesting Physician: Dr. Lucianne Muss  Reason for consult: Right foot wounds  Chief Complaint: Right foot wounds, cellulitis   HPI: [name] is a 57 y.o. male who presents with nonhealing wounds to the right foot.  Patient has a history of transmetatarsal amputation.  Patient arrived to the hospital via EMS due to concerns for infection.  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  Patient notes that he recently got a wound to the outside of his right foot and to the plantar aspect of the right foot due to wearing a different type of brace on his foot.  He notes that he wears an AFO for dropfoot and does wear diabetic shoes with insoles.  Patient noticed increased redness and swelling to the foot and lower leg and subsequently went to the emergency room due to this.  Patient was admitted to the hospital for concerns for sepsis and cellulitis.  Podiatry team was consulted for further evaluation.  Patient presents today resting in bed comfortably.  He notes that he does have neuropathy and history of diabetes.  PMHx:  Past Medical History:  Diagnosis Date   Acute prostatitis without hematuria 05/20/2022   Allergy    Complication of anesthesia    pt states at [facility] he stopped breathing due to his sleep apnea-hard to wake up   COVID-19    Depression    Diabetic ulcer of both feet    DM (diabetes mellitus), type 2    History of kidney stones    History of methicillin resistant staphylococcus aureus (MRSA) 09/2020   foot   Neuromuscular disorder    neuropathy of back and bilateral feet   Obesity    Sleep apnea    does not use cpap    Surgical Hx:  Past Surgical History:  Procedure Laterality Date   AMPUTATION Left    toe amputation   CYSTOSCOPY/URETEROSCOPY/HOLMIUM LASER/STENT PLACEMENT Right 07/29/2022   Procedure: CYSTOSCOPY/URETEROSCOPY/HOLMIUM  LASER/STENT PLACEMENT;  Surgeon: Sondra Come, MD;  Location: [facility] ORS;  Service: Urology;  Laterality: Right;   FOOT SURGERY Right 09/2012   cyst removed   SPINE SURGERY  2004   nerve damage in spine   TOE AMPUTATION Right 06/30/2016   5 toes    FHx:  Family History  Problem Relation Age of Onset   Heart disease Father    Stroke Father    Heart attack Father 63   Heart disease Maternal Grandmother    Stroke Maternal Grandmother    Heart disease Paternal Grandfather    Stroke Paternal Grandfather     Social History:  reports that he has never smoked. He has never been exposed to tobacco smoke. He has never used smokeless tobacco. He reports that he does not drink alcohol and does not use drugs.  Allergies:  Allergies  Allergen Reactions   Vancomycin Itching    Other reaction(s): Other (See Comments) Red mans    Medications Prior to Admission  Medication Sig Dispense Refill   aspirin EC 81 MG tablet Take 81 mg by mouth daily. Swallow whole.     atorvastatin (LIPITOR) 20 MG tablet TAKE 1 TABLET BY MOUTH EVERY DAY 90 tablet 3   metFORMIN (GLUCOPHAGE-XR) 500 MG 24 hr tablet TAKE 1 TABLET BY MOUTH EVERY DAY WITH BREAKFAST (Patient taking differently: Take 500 mg by mouth daily with breakfast.) 90 tablet 1  Multiple Vitamins-Minerals (MULTIVITAMIN WITH MINERALS) tablet Take 1 tablet by mouth daily.     oxyCODONE (ROXICODONE) 5 MG immediate release tablet Take 1.5 tablets (7.5 mg total) by mouth daily. As needed for severe pain 45 tablet 0   tamsulosin (FLOMAX) 0.4 MG CAPS capsule Take 0.4 mg by mouth daily.     telmisartan (MICARDIS) 20 MG tablet Take 1 tablet (20 mg total) by mouth at bedtime. 90 tablet 0   DULoxetine (CYMBALTA) 30 MG capsule TAKE 1 CAPSULE BY MOUTH EVERY DAY (Patient not taking: Reported on 12/18/2022) 90 capsule 2    Physical Exam: General: Alert and oriented.  No apparent distress.  Vascular: DP/PT pulses palpable bilateral, minimal to no hair growth  noted to both feet, moderate edema and erythema present to the right foot and ankle extending to the lower leg, mild erythema present to the left side, nonpitting  Neuro: Light touch sensation nearly absent to bilateral lower extremities.  Derm: Ulceration present to the right fifth metatarsal base area which measures approximately 2.2 cm x 2.5 cm x 0.2 cm, wound bed appears to be fibrogranular, mild hyperkeratotic buildup around the periphery, mild serous drainage, no bone exposed.    Ulceration also present to the plantar aspect of the right midfoot near the first metatarsal base/first TMT, measures 1.5 cm x 0.5 cm x 0.2cm, mild hyperkeratotic buildup, mild purulent discharge present from this wound today which appears to be tunneling near the distal aspect of the transmetatarsal amputation site medially.  Once this tissue was removed the wound bed appeared to be healthy and viable after debridement.  Mild associated erythema and edema present around the wound in this area.    MSK: Right transmetatarsal amputation, left third toe amputation.  No obvious palpable fluctuance present to the right foot or ankle.  Results for orders placed or performed during the hospital encounter of 12/18/22 (from the past 48 hour(s))  Comprehensive metabolic panel     Status: Abnormal   Collection Time: 12/18/22 11:00 AM  Result Value Ref Range   Sodium 137 135 - 145 mmol/L   Potassium 3.8 3.5 - 5.1 mmol/L   Chloride 105 98 - 111 mmol/L   CO2 20 (L) 22 - 32 mmol/L   Glucose, Bld 175 (H) 70 - 99 mg/dL    Comment: Glucose reference range applies only to samples taken after fasting for at least 8 hours.   BUN 15 6 - 20 mg/dL   Creatinine, Ser 1.61 0.61 - 1.24 mg/dL   Calcium 8.6 (L) 8.9 - 10.3 mg/dL   Total Protein 7.7 6.5 - 8.1 g/dL   Albumin 3.8 3.5 - 5.0 g/dL   AST 41 15 - 41 U/L   ALT 36 0 - 44 U/L   Alkaline Phosphatase 50 38 - 126 U/L   Total Bilirubin 1.8 (H) 0.3 - 1.2 mg/dL   GFR, Estimated >09  >60 mL/min    Comment: (NOTE) Calculated using the CKD-EPI Creatinine Equation (2021)    Anion gap 12 5 - 15    Comment: Performed at [facility], [address] [place], [place] [address]  Lactic acid, plasma     Status: Abnormal   Collection Time: 12/18/22 11:00 AM  Result Value Ref Range   Lactic Acid, Venous 2.8 (HH) 0.5 - 1.9 mmol/L    Comment: CRITICAL VALUE NOTED. VALUE IS CONSISTENT WITH PREVIOUSLY REPORTED/CALLED VALUE [place] NICHERSON 12/18/22 @ 1143 BY SB Performed at [facility], [address] [place], [place]  [address]   CBC with Differential     Status: Abnormal   Collection Time: 12/18/22 11:00 AM  Result Value Ref Range   WBC 9.2 4.0 - 10.5 K/uL   RBC 5.20 4.22 - 5.81 MIL/uL   Hemoglobin 15.6 13.0 - 17.0 g/dL   HCT 60.4 54.0 - 98.1 %   MCV 90.4 80.0 - 100.0 fL   MCH 30.0 26.0 - 34.0 pg   MCHC 33.2 30.0 - 36.0 g/dL   RDW 19.1 47.8 - 29.5 %   Platelets 143 (L) 150 - 400 K/uL   nRBC 0.0 0.0 - 0.2 %   Neutrophils Relative % 85 %   Neutro Abs 7.9 (H) 1.7 - 7.7 K/uL   Lymphocytes Relative 5 %   Lymphs Abs 0.4 (L) 0.7 - 4.0 K/uL   Monocytes Relative 7 %   Monocytes Absolute 0.7 0.1 - 1.0 K/uL   Eosinophils Relative 1 %   Eosinophils Absolute 0.1 0.0 - 0.5 K/uL   Basophils Relative 1 %   Basophils Absolute 0.1 0.0 - 0.1 K/uL   Immature Granulocytes 1 %   Abs Immature Granulocytes 0.05 0.00 - 0.07 K/uL    Comment: Performed at [facility], [address] [place], [place] [address]  Protime-INR     Status: Abnormal   Collection Time: 12/18/22 11:00 AM  Result Value Ref Range   Prothrombin Time 15.9 (H) 11.4 - 15.2 seconds   INR 1.3 (H) 0.8 - 1.2    Comment: (NOTE) INR goal varies based on device and disease states. Performed at [facility], [address] [place], [place] [address]   Resp panel by RT-PCR (RSV, Flu A&B, Covid) Anterior Nasal Swab     Status: None   Collection Time: 12/18/22 11:00 AM   Specimen:  Anterior Nasal Swab  Result Value Ref Range   SARS Coronavirus 2 by RT PCR NEGATIVE NEGATIVE    Comment: (NOTE) SARS-CoV-2 target nucleic acids are NOT DETECTED.  The SARS-CoV-2 RNA is generally detectable in upper respiratory specimens during the acute phase of infection. The lowest concentration of SARS-CoV-2 viral copies this assay can detect is 138 copies/mL. A negative result does not preclude SARS-Cov-2 infection and should not be used as the sole basis for treatment or other patient management decisions. A negative result may occur with  improper specimen collection/handling, submission of specimen other than nasopharyngeal swab, presence of viral mutation(s) within the areas targeted by this assay, and inadequate number of viral copies(<138 copies/mL). A negative result must be combined with clinical observations, patient history, and epidemiological information. The expected result is Negative.  Fact Sheet for Patients:  BloggerCourse.com  Fact Sheet for Healthcare Providers:  SeriousBroker.it  This test is no t yet approved or cleared by the [place] FDA and  has been authorized for detection and/or diagnosis of SARS-CoV-2 by FDA under an Emergency Use Authorization (EUA). This EUA will remain  in effect (meaning this test can be used) for the duration of the COVID-19 declaration under Section 564(b)(1) of the Act, 21 U.S.C.section 360bbb-3(b)(1), unless the authorization is terminated  or revoked sooner.       Influenza A by PCR NEGATIVE NEGATIVE   Influenza B by PCR NEGATIVE NEGATIVE    Comment: (NOTE) The Xpert Xpress SARS-CoV-2/FLU/RSV plus assay is intended as an aid in the diagnosis of influenza from Nasopharyngeal swab specimens and should not be used as a sole basis for treatment. Nasal washings and aspirates are unacceptable for Xpert Xpress  SARS-CoV-2/FLU/RSV testing.  Fact Sheet for  Patients: BloggerCourse.com  Fact Sheet for Healthcare Providers: SeriousBroker.it  This test is not yet approved or cleared by the [place] FDA and has been authorized for detection and/or diagnosis of SARS-CoV-2 by FDA under an Emergency Use Authorization (EUA). This EUA will remain in effect (meaning this test can be used) for the duration of the COVID-19 declaration under Section 564(b)(1) of the Act, 21 U.S.C. section 360bbb-3(b)(1), unless the authorization is terminated or revoked.     Resp Syncytial Virus by PCR NEGATIVE NEGATIVE    Comment: (NOTE) Fact Sheet for Patients: BloggerCourse.com  Fact Sheet for Healthcare Providers: SeriousBroker.it  This test is not yet approved or cleared by the [place] FDA and has been authorized for detection and/or diagnosis of SARS-CoV-2 by FDA under an Emergency Use Authorization (EUA). This EUA will remain in effect (meaning this test can be used) for the duration of the COVID-19 declaration under Section 564(b)(1) of the Act, 21 U.S.C. section 360bbb-3(b)(1), unless the authorization is terminated or revoked.  Performed at [facility], [address] [place], [place] [address]   APTT     Status: None   Collection Time: 12/18/22 11:00 AM  Result Value Ref Range   aPTT 26 24 - 36 seconds    Comment: Performed at [facility], [address] [place], [place] [address]  Procalcitonin     Status: None   Collection Time: 12/18/22 11:00 AM  Result Value Ref Range   Procalcitonin 0.87 ng/mL    Comment:        Interpretation: PCT > 0.5 ng/mL and <= 2 ng/mL: Systemic infection (sepsis) is possible, but other conditions are known to elevate PCT as well. (NOTE)       Sepsis PCT Algorithm           Lower Respiratory Tract                                      Infection PCT Algorithm     ----------------------------     ----------------------------         PCT < 0.25 ng/mL                PCT < 0.10 ng/mL          Strongly encourage             Strongly discourage   discontinuation of antibiotics    initiation of antibiotics    ----------------------------     -----------------------------       PCT 0.25 - 0.50 ng/mL            PCT 0.10 - 0.25 ng/mL               OR       >80% decrease in PCT            Discourage initiation of                                            antibiotics      Encourage discontinuation           of antibiotics    ----------------------------     -----------------------------         PCT >= 0.50 ng/mL  PCT 0.26 - 0.50 ng/mL                AND       <80% decrease in PCT             Encourage initiation of                                             antibiotics       Encourage continuation           of antibiotics    ----------------------------     -----------------------------        PCT >= 0.50 ng/mL                  PCT > 0.50 ng/mL               AND         increase in PCT                  Strongly encourage                                      initiation of antibiotics    Strongly encourage escalation           of antibiotics                                     -----------------------------                                           PCT <= 0.25 ng/mL                                                 OR                                        > 80% decrease in PCT                                      Discontinue / Do not initiate                                             antibiotics  Performed at [facility], [address] [place], [place] [address]   Urinalysis, Routine w reflex microscopic -Urine, Clean Catch     Status: Abnormal   Collection Time: 12/18/22 11:33 AM  Result Value Ref Range   Color, Urine YELLOW (A) YELLOW   APPearance HAZY (A) CLEAR   Specific Gravity, Urine 1.024 1.005 - 1.030   pH 5.0  5.0 - 8.0   Glucose, UA NEGATIVE NEGATIVE mg/dL   Hgb urine  dipstick SMALL (A) NEGATIVE   Bilirubin Urine NEGATIVE NEGATIVE   Ketones, ur 20 (A) NEGATIVE mg/dL   Protein, ur >=409 (A) NEGATIVE mg/dL   Nitrite NEGATIVE NEGATIVE   Leukocytes,Ua NEGATIVE NEGATIVE   RBC / HPF 0-5 0 - 5 RBC/hpf   WBC, UA 11-20 0 - 5 WBC/hpf   Bacteria, UA NONE SEEN NONE SEEN   Squamous Epithelial / HPF 0-5 0 - 5 /HPF   Mucus PRESENT    Hyaline Casts, UA PRESENT    Sperm, UA PRESENT     Comment: Performed at [facility], [address], [place], [place] [address]  Troponin I (High Sensitivity)     Status: Abnormal   Collection Time: 12/18/22 12:52 PM  Result Value Ref Range   Troponin I (High Sensitivity) 18 (H) <18 ng/L    Comment: (NOTE) Elevated high sensitivity troponin I (hsTnI) values and significant  changes across serial measurements may suggest ACS but many other  chronic and acute conditions are known to elevate hsTnI results.  Refer to the "Links" section for chest pain algorithms and additional  guidance. Performed at [facility], [address] [place], [place] [address]   Brain natriuretic peptide     Status: Abnormal   Collection Time: 12/18/22 12:52 PM  Result Value Ref Range   B Natriuretic Peptide 125.4 (H) 0.0 - 100.0 pg/mL    Comment: Performed at [facility], [address] [place], [place] [address]  Lactic acid, plasma     Status: None   Collection Time: 12/18/22 12:57 PM  Result Value Ref Range   Lactic Acid, Venous 1.5 0.5 - 1.9 mmol/L    Comment: Performed at [facility], [address] [place], [place] [address]  Glucose, capillary     Status: Abnormal   Collection Time: 12/18/22  5:09 PM  Result Value Ref Range   Glucose-Capillary 327 (H) 70 - 99 mg/dL    Comment: Glucose reference range applies only to samples taken after fasting for at least 8 hours.  Glucose, capillary     Status: Abnormal   Collection Time: 12/18/22   8:15 PM  Result Value Ref Range   Glucose-Capillary 230 (H) 70 - 99 mg/dL    Comment: Glucose reference range applies only to samples taken after fasting for at least 8 hours.  Protime-INR     Status: Abnormal   Collection Time: 12/19/22  6:00 AM  Result Value Ref Range   Prothrombin Time 16.5 (H) 11.4 - 15.2 seconds   INR 1.3 (H) 0.8 - 1.2    Comment: (NOTE) INR goal varies based on device and disease states. Performed at [facility], [address] [place], [place] [address]   Basic metabolic panel     Status: Abnormal   Collection Time: 12/19/22  6:00 AM  Result Value Ref Range   Sodium 134 (L) 135 - 145 mmol/L   Potassium 3.6 3.5 - 5.1 mmol/L   Chloride 108 98 - 111 mmol/L   CO2 21 (L) 22 - 32 mmol/L   Glucose, Bld 305 (H) 70 - 99 mg/dL    Comment: Glucose reference range applies only to samples taken after fasting for at least 8 hours.   BUN 14 6 - 20 mg/dL   Creatinine, Ser 2.95 0.61 - 1.24 mg/dL   Calcium 7.6 (L) 8.9 - 10.3 mg/dL   GFR, Estimated >28 >41 mL/min    Comment: (NOTE) Calculated using the CKD-EPI Creatinine Equation (2021)  Anion gap 5 5 - 15    Comment: Performed at [facility], [address] [place], [place] [address]  CBC     Status: Abnormal   Collection Time: 12/19/22  6:00 AM  Result Value Ref Range   WBC 8.0 4.0 - 10.5 K/uL   RBC 4.31 4.22 - 5.81 MIL/uL   Hemoglobin 13.2 13.0 - 17.0 g/dL   HCT 60.4 54.0 - 98.1 %   MCV 91.6 80.0 - 100.0 fL   MCH 30.6 26.0 - 34.0 pg   MCHC 33.4 30.0 - 36.0 g/dL   RDW 19.1 47.8 - 29.5 %   Platelets 125 (L) 150 - 400 K/uL   nRBC 0.0 0.0 - 0.2 %    Comment: Performed at [facility], [address], [place], [place] [address]  Phosphorus     Status: Abnormal   Collection Time: 12/19/22  6:00 AM  Result Value Ref Range   Phosphorus 2.1 (L) 2.5 - 4.6 mg/dL    Comment: Performed at [facility], [address], [place], [place] [address]  Magnesium     Status: None    Collection Time: 12/19/22  6:00 AM  Result Value Ref Range   Magnesium 2.0 1.7 - 2.4 mg/dL    Comment: Performed at [facility], [address] [place], [place] [address]  Glucose, capillary     Status: Abnormal   Collection Time: 12/19/22  7:24 AM  Result Value Ref Range   Glucose-Capillary 245 (H) 70 - 99 mg/dL    Comment: Glucose reference range applies only to samples taken after fasting for at least 8 hours.  Glucose, capillary     Status: Abnormal   Collection Time: 12/19/22 12:02 PM  Result Value Ref Range   Glucose-Capillary 288 (H) 70 - 99 mg/dL    Comment: Glucose reference range applies only to samples taken after fasting for at least 8 hours.   MR FOOT RIGHT W WO CONTRAST  Result Date: 12/19/2022 CLINICAL DATA:  Worsening diabetic foot ulcer.  Prior amputation. EXAM: MRI OF THE RIGHT FOREFOOT WITHOUT AND WITH CONTRAST TECHNIQUE: Multiplanar, multisequence MR imaging of the right foot was performed before and after the administration of intravenous contrast. CONTRAST:  10mL GADAVIST GADOBUTROL 1 MMOL/ML IV SOLN COMPARISON:  Radiographs 12/18/2022 and 02/12/2013. Right foot MRI 02/15/2013. FINDINGS: Bones/Joint/Cartilage As demonstrated on the recent radiographs, the patient is status post transmetatarsal amputation. The remaining metatarsal bases demonstrate no suspicious marrow signal, enhancement or cortical destruction to suggest osteomyelitis. There is partial ankylosis between the cuneiform bones and the 1st through 3rd metatarsal bases. There are no significant joint effusions. Mild midfoot degenerative changes are present. There is a tubular area of signal change extending through the talar head and adjacent navicular which is probably postsurgical. This demonstrates no suspicious enhancement. The tibiotalar joint appears unremarkable. Ligaments No significant ligamentous abnormalities. Muscles and Tendons Postsurgical changes related to previous transmetatarsal  amputation. Associated lower leg and hindfoot atrophy. No focal intramuscular fluid collection, abnormal enhancement or tenosynovitis. The ankle tendons appear intact. Soft tissues There is postsurgical susceptibility artifact medially in the remaining forefoot. There is an area of apparent skin ulceration along the plantar foot at the level of the medial tarsometatarsal joint. Inflammatory changes are present throughout the surrounding subcutaneous fat with T2 hyperintensity and low level enhancement. No focal fluid collections are identified. Mild nonspecific subcutaneous edema extends into the dorsal aspect of the midfoot and distal lower leg. IMPRESSION: 1. Apparent skin ulceration  along the plantar foot at the level of the medial tarsometatarsal joint with surrounding cellulitis. No focal abscess or other fluid collection identified. 2. No evidence of osteomyelitis or septic arthropathy. Postsurgical changes related to previous transmetatarsal amputation. 3. Mild midfoot degenerative changes with partial ankylosis between the cuneiform bones and the 1st through 3rd metatarsal bases. Electronically Signed   By: Carey Bullocks M.D.   On: 12/19/2022 07:44   DG Foot Complete Right  Result Date: 12/18/2022 CLINICAL DATA:  Wound the patient's stump. EXAM: RIGHT FOOT COMPLETE - 3+ VIEW COMPARISON:  Foot radiographs dated 02/12/2013. FINDINGS: The patient is status post a forefoot resection at the level of the proximal metatarsals. There is no evidence of fracture or dislocation. No focal osseous demineralization to suggest osteomyelitis. Degenerative changes are seen in the midfoot as well as a plantar calcaneal enthesophyte. There is soft tissue swelling around the foot. IMPRESSION: No radiographic evidence of osteomyelitis. Electronically Signed   By: Romona Curls M.D.   On: 12/18/2022 11:58   DG Chest Port 1 View  Result Date: 12/18/2022 CLINICAL DATA:  Sepsis EXAM: PORTABLE CHEST 1 VIEW COMPARISON:  None  Available. FINDINGS: The heart is enlarged. Mild diffuse bilateral interstitial opacities are noted. There is no pleural effusion or pneumothorax. The osseous structures appear intact. IMPRESSION: 1. Mild diffuse bilateral interstitial opacities may represent pulmonary edema or atypical infection. 2. Cardiomegaly. Electronically Signed   By: Romona Curls M.D.   On: 12/18/2022 11:56    Blood pressure 116/64, pulse 65, temperature 97.8 F (36.6 C), temperature source Oral, resp. rate 16, height 5\' 10"  (1.778 m), weight 103.9 kg, SpO2 99 %.  Assessment Right foot diabetic foot ulcerations x 2 with associated cellulitis Diabetes type 2 polyneuropathy  Plan -Patient seen and examined. -X-ray imaging and MRI imaging reviewed and discussed with patient detail.  No obvious abscess or osteomyelitis present. -Discussed treatment options with patient detail.  Perform wound debridement as described below, patient tolerated procedure well. -Applied Betadine wet-to-dry dressing today.  Patient can have Aquasol dressing changes per wound care instructions.  Believe this would be appropriate.  Recommend daily changes at this point. -Surgical shoe ordered for right foot for ambulation.  Recommend heel contact weightbearing.  PT/OT ordered. -Appreciate medicine recommendations for antibiotic therapy.  ID will likely be consulted due to patient's failure with outpatient antibiotic therapy.  Wound culture taken today and sent off. -Likely will see patient again tomorrow for another dressing change and if it appears to be improving we will likely sign off at that point.  Debridment of ulcer: Location: Right lateral fifth metatarsal base Pre-debridement measurement: 2.2 x 2.5 x 0.2 cm Post-debridement measurement: Same Tissue removed:   Hyperkeratotic tissue, biofilm, fibrous tissue Ulcer was debrided sharply with combination of tissue nippers and scalpel blade into the subcutaneous tissue  Debridment of  ulcer: Location: Right plantar midfoot first metatarsal/TMT J Pre-debridement measurement: 1.5 x 0.5 x 0.2 cm Post-debridement measurement: 2.2 x 0.6 x 0.2 cm Tissue removed:   Fibrous tissue, biofilm Ulcer was debrided sharply with combination of tissue nippers and scalpel blade into the subcutaneous tissue   Rosetta Posner 12/19/2022, 1:37 PM

## 2022-12-19 NOTE — Inpatient Diabetes Management (Signed)
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

## 2022-12-19 NOTE — Consult Note (Signed)
NAME: [name]  DOB: 15-Sep-1965  MRN: [id number]  Date/Time: 12/19/2022 4:17 PM  REQUESTING PROVIDERDr>kumar Subjective:  REASON FOR CONSULT: rt foot infection ? [name] is a 57 y.o. with a history of DM. HTN DFI, rt foot  s/p TMA Stump site ulceration ,   followed at [facility], presented to ED with sudden onset weakness, chills yesterday He is followed at the  [facility] podiatry/wound clinic worsening of rt foot infection and failing OP antibiotics , was given clindamycin on 12/01/22 for 10 days Pt has some dysuria He has h/o lithotripsy and stent in the past  In the ED vitals  12/18/22 11:02  BP 130/66  Temp 103 F (39.4 C) !  Pulse Rate 128 !  Resp 22 !  SpO2 95 %     Latest Reference Range & Units 12/18/22  WBC 4.0 - 10.5 K/uL 9.2  Hemoglobin 13.0 - 17.0 g/dL 96.2  HCT 95.2 - 84.1 % 47.0  Platelets 150 - 400 K/uL 143 (L)  Creatinine 0.61 - 1.24 mg/dL 3.24  Blood culture sent MRI rt foot- TMA showed No evidence of osteomyelitis or septic arthropathy. Postsurgical changes related to previous transmetatarsal amputation. Blood culture sent Pt started on vanco/cefepime/flagyl Podiatrists saw him and did some debridement of the foot ulcer and sent culture H/o MRSA infection of the rt foot in the past I am asked to see him for further antibiotic management He works at [employer] and last at work on friday A dog at home Past Medical History:  Diagnosis Date   Acute prostatitis without hematuria 05/20/2022   Allergy    Complication of anesthesia    pt states at [facility] he stopped breathing due to his sleep apnea-hard to wake up   COVID-19    Depression    Diabetic ulcer of both feet    DM (diabetes mellitus), type 2    History of kidney stones    History of methicillin resistant staphylococcus aureus (MRSA) 09/2020   foot   Neuromuscular disorder    neuropathy of back and bilateral feet   Obesity    Sleep apnea    does not use cpap    Past Surgical History:  Procedure  Laterality Date   AMPUTATION Left    toe amputation   CYSTOSCOPY/URETEROSCOPY/HOLMIUM LASER/STENT PLACEMENT Right 07/29/2022   Procedure: CYSTOSCOPY/URETEROSCOPY/HOLMIUM LASER/STENT PLACEMENT;  Surgeon: Sondra Come, MD;  Location: [facility] ORS;  Service: Urology;  Laterality: Right;   FOOT SURGERY Right 09/2012   cyst removed   SPINE SURGERY  2004   nerve damage in spine   TOE AMPUTATION Right 06/30/2016   5 toes    Social History   Socioeconomic History   Marital status: Married    Spouse name: Not on file   Number of children: Not on file   Years of education: Not on file   Highest education level: Not on file  Occupational History   Not on file  Tobacco Use   Smoking status: Never    Passive exposure: Never   Smokeless tobacco: Never  Vaping Use   Vaping Use: Never used  Substance and Sexual Activity   Alcohol use: Never   Drug use: Never   Sexual activity: Not Currently  Other Topics Concern   Not on file  Social History Narrative   Not on file   Social Determinants of Health   Financial Resource Strain: Not on file  Food Insecurity: Not on file  Transportation Needs: Not on file  Physical Activity: Not on file  Stress: Not on file  Social Connections: Not on file  Intimate Partner Violence: Not on file    Family History  Problem Relation Age of Onset   Heart disease Father    Stroke Father    Heart attack Father 52   Heart disease Maternal Grandmother    Stroke Maternal Grandmother    Heart disease Paternal Grandfather    Stroke Paternal Grandfather    Allergies  Allergen Reactions   Vancomycin Itching    Other reaction(s): Other (See Comments) Red mans   I? Current Facility-Administered Medications  Medication Dose Route Frequency Provider Last Rate Last Admin   acetaminophen (TYLENOL) tablet 650 mg  650 mg Oral Q6H PRN Cox, Amy N, DO   650 mg at 12/18/22 2202   Or   acetaminophen (TYLENOL) suppository 650 mg  650 mg Rectal Q6H PRN Cox, Amy N,  DO       acyclovir (ZOVIRAX) 200 MG capsule 400 mg  400 mg Oral BID Gillis Santa, MD   400 mg at 12/19/22 1221   albuterol (PROVENTIL) (2.5 MG/3ML) 0.083% nebulizer solution 2.5 mg  2.5 mg Nebulization Q6H PRN Cox, Amy N, DO       [START ON 12/20/2022] aspirin EC tablet 81 mg  81 mg Oral Daily Cox, Amy N, DO       atorvastatin (LIPITOR) tablet 20 mg  20 mg Oral QHS Cox, Amy N, DO   20 mg at 12/18/22 2034   azithromycin (ZITHROMAX) 500 mg in sodium chloride 0.9 % 250 mL IVPB  500 mg Intravenous Q24H Cox, Amy N, DO 250 mL/hr at 12/19/22 1454 500 mg at 12/19/22 1454   calcium carbonate (TUMS - dosed in mg elemental calcium) chewable tablet 200 mg of elemental calcium  1 tablet Oral BID PRN Cox, Amy N, DO   200 mg of elemental calcium at 12/18/22 2202   ceFEPIme (MAXIPIME) 2 g in sodium chloride 0.9 % 100 mL IVPB  2 g Intravenous Q8H Cox, Amy N, DO 200 mL/hr at 12/19/22 1228 2 g at 12/19/22 1228   diphenhydrAMINE (BENADRYL) capsule 25 mg  25 mg Oral Q6H PRN Gillis Santa, MD       gabapentin (NEURONTIN) capsule 100 mg  100 mg Oral TID Gillis Santa, MD       heparin injection 5,000 Units  5,000 Units Subcutaneous Q8H Cox, Amy N, DO   5,000 Units at 12/19/22 1320   insulin aspart (novoLOG) injection 0-15 Units  0-15 Units Subcutaneous TID WC Cox, Amy N, DO   8 Units at 12/19/22 1221   insulin aspart (novoLOG) injection 0-5 Units  0-5 Units Subcutaneous QHS Cox, Amy N, DO   2 Units at 12/18/22 2035   insulin glargine-yfgn (SEMGLEE) injection 10 Units  10 Units Subcutaneous Daily Gillis Santa, MD   10 Units at 12/19/22 1009   irbesartan (AVAPRO) tablet 75 mg  75 mg Oral QHS Cox, Amy N, DO   75 mg at 12/18/22 2034   melatonin tablet 5 mg  5 mg Oral QHS PRN Cox, Amy N, DO   5 mg at 12/18/22 2035   multivitamin with minerals tablet   Oral Daily Cox, Amy N, DO   1 tablet at 12/19/22 1008   ondansetron (ZOFRAN) tablet 4 mg  4 mg Oral Q6H PRN Cox, Amy N, DO       Or   ondansetron (ZOFRAN) injection 4 mg  4 mg  Intravenous Q6H PRN Cox,  Amy N, DO       oxyCODONE (Oxy IR/ROXICODONE) immediate release tablet 5 mg  5 mg Oral Q6H PRN Gillis Santa, MD   5 mg at 12/19/22 1452   phosphorus (K PHOS NEUTRAL) tablet 500 mg  500 mg Oral TID Gillis Santa, MD       polyethylene glycol (MIRALAX / GLYCOLAX) packet 17 g  17 g Oral BID PRN Cox, Amy N, DO       senna-docusate (Senokot-S) tablet 1 tablet  1 tablet Oral QHS PRN Cox, Amy N, DO       tamsulosin (FLOMAX) capsule 0.4 mg  0.4 mg Oral Daily Cox, Amy N, DO   0.4 mg at 12/19/22 1009   vancomycin (VANCOREADY) IVPB 1250 mg/250 mL  1,250 mg Intravenous Q12H Cox, Amy N, DO 83.3 mL/hr at 12/19/22 1004 1,250 mg at 12/19/22 1004     Abtx:  Anti-infectives (From admission, onward)    Start     Dose/Rate Route Frequency Ordered Stop   12/19/22 1230  acyclovir (ZOVIRAX) 200 MG capsule 400 mg       Note to Pharmacy: For herpes prophylaxis   400 mg Oral 2 times daily 12/19/22 1151     12/18/22 2200  vancomycin (VANCOREADY) IVPB 1250 mg/250 mL        1,250 mg 83.3 mL/hr over 180 Minutes Intravenous Every 12 hours 12/18/22 1403     12/18/22 2000  ceFEPIme (MAXIPIME) 2 g in sodium chloride 0.9 % 100 mL IVPB        2 g 200 mL/hr over 30 Minutes Intravenous Every 8 hours 12/18/22 1403     12/18/22 1500  azithromycin (ZITHROMAX) 500 mg in sodium chloride 0.9 % 250 mL IVPB        500 mg 250 mL/hr over 60 Minutes Intravenous Every 24 hours 12/18/22 1450 12/23/22 1459   12/18/22 1330  vancomycin (VANCOCIN) IVPB 1000 mg/200 mL premix       See Hyperspace for full Linked Orders Report.   1,000 mg 120 mL/hr over 100 Minutes Intravenous  Once 12/18/22 1144 12/19/22 0015   12/18/22 1145  vancomycin (VANCOCIN) IVPB 1000 mg/200 mL premix  Status:  Discontinued       See Hyperspace for full Linked Orders Report.   1,000 mg 120 mL/hr over 100 Minutes Intravenous  Once 12/18/22 1136 12/18/22 1144   12/18/22 1145  vancomycin (VANCOCIN) IVPB 1000 mg/200 mL premix  Status:   Discontinued       See Hyperspace for full Linked Orders Report.   1,000 mg 120 mL/hr over 100 Minutes Intravenous  Once 12/18/22 1136 12/18/22 1144   12/18/22 1145  vancomycin (VANCOCIN) IVPB 1000 mg/200 mL premix       See Hyperspace for full Linked Orders Report.   1,000 mg 120 mL/hr over 100 Minutes Intravenous  Once 12/18/22 1144 12/18/22 1345   12/18/22 1130  vancomycin (VANCOREADY) IVPB 2000 mg/400 mL  Status:  Discontinued        2,000 mg 120 mL/hr over 200 Minutes Intravenous  Once 12/18/22 1123 12/18/22 1133   12/18/22 1115  ceFEPIme (MAXIPIME) 2 g in sodium chloride 0.9 % 100 mL IVPB        2 g 200 mL/hr over 30 Minutes Intravenous  Once 12/18/22 1107 12/18/22 1159   12/18/22 1115  metroNIDAZOLE (FLAGYL) IVPB 500 mg        500 mg 100 mL/hr over 60 Minutes Intravenous  Once 12/18/22 1107 12/18/22 1231   12/18/22  1115  vancomycin (VANCOCIN) IVPB 1000 mg/200 mL premix  Status:  Discontinued        1,000 mg 200 mL/hr over 60 Minutes Intravenous  Once 12/18/22 1107 12/18/22 1122       REVIEW OF SYSTEMS:  Const:  fever,  chills, negative weight loss Eyes: negative diplopia or visual changes, negative eye pain ENT: negative coryza, negative sore throat Resp: negative cough, hemoptysis, some dyspnea, wheeze Cards: negative for chest pain, palpitations, lower extremity edema GU: , dysuria no hematuria GI: Negative for abdominal pain, diarrhea, bleeding, constipation Skin: negative for rash and pruritus Heme: negative for easy bruising and gum/nose bleeding MS: muscle weakness Neurolo:negative for headaches, dizziness, vertigo, memory problems  Psych: negative for feelings of anxiety, depression  Endocrine: , diabetes Allergy/Immunology- vanco red man syndrome But is getting it  ? Objective:  VITALS:  BP 119/71 (BP Location: Right Arm)   Pulse 81   Temp 98 F (36.7 C)   Resp 19   Ht 5\' 10"  (1.778 m)   Wt 103.9 kg   SpO2 97%   BMI 32.86 kg/m   PHYSICAL EXAM:   General: Alert, cooperative, no distress, appears stated age.  Head: Normocephalic, without obvious abnormality, atraumatic. Eyes: Conjunctivae clear, anicteric sclerae. Pupils are equal ENT Nares normal. No drainage or sinus tenderness. Lips, mucosa, and tongue normal. No Thrush Neck: Supple, symmetrical, no adenopathy, thyroid: non tender no carotid bruit and no JVD. Back: No CVA tenderness. Lungs: Clear to auscultation bilaterally. No Wheezing or Rhonchi. No rales. Heart: Regular rate and rhythm, no murmur, rub or gallop. Abdomen: Soft, non-tender,not distended. Bowel sounds normal. No masses Extremities:     The foot does not look overtly infected Rt knee thickened skin due to knee scooter use Skin: No rashes or lesions. Or bruising Lymph: Cervical, supraclavicular normal. Neurologic: peripheral neuropathy Pertinent Labs Lab Results CBC    Component Value Date/Time   WBC 8.0 12/19/2022 0600   RBC 4.31 12/19/2022 0600   HGB 13.2 12/19/2022 0600   HCT 39.5 12/19/2022 0600   PLT 125 (L) 12/19/2022 0600   MCV 91.6 12/19/2022 0600   MCH 30.6 12/19/2022 0600   MCHC 33.4 12/19/2022 0600   RDW 14.2 12/19/2022 0600   LYMPHSABS 0.4 (L) 12/18/2022 1100   MONOABS 0.7 12/18/2022 1100   EOSABS 0.1 12/18/2022 1100   BASOSABS 0.1 12/18/2022 1100       Latest Ref Rng & Units 12/19/2022    6:00 AM 12/18/2022   11:00 AM 09/21/2022    7:36 AM  CMP  Glucose 70 - 99 mg/dL 629  528  413    244   BUN 6 - 20 mg/dL 14  15  19    19    Creatinine 0.61 - 1.24 mg/dL 0.10  2.72  5.36    6.44   Sodium 135 - 145 mmol/L 134  137  138    138   Potassium 3.5 - 5.1 mmol/L 3.6  3.8  4.1    4.1   Chloride 98 - 111 mmol/L 108  105  104    104   CO2 22 - 32 mmol/L 21  20  25    25    Calcium 8.9 - 10.3 mg/dL 7.6  8.6  8.7    8.7   Total Protein 6.5 - 8.1 g/dL  7.7  7.3   Total Bilirubin 0.3 - 1.2 mg/dL  1.8  0.8   Alkaline Phos 38 - 126 U/L  50  31  AST 15 - 41 U/L  41  35   ALT 0 - 44  U/L  36  50       Microbiology: Recent Results (from the past 240 hour(s))  Culture, blood (Routine x 2)     Status: None (Preliminary result)   Collection Time: 12/18/22 11:00 AM   Specimen: BLOOD  Result Value Ref Range Status   Specimen Description BLOOD LEFT ANTECUBITAL  Final   Special Requests   Final    BOTTLES DRAWN AEROBIC AND ANAEROBIC Blood Culture adequate volume   Culture   Final    NO GROWTH 1 DAY Performed at [facility], [address], [place], [place] [address]    Report Status PENDING  Incomplete  Resp panel by RT-PCR (RSV, Flu A&B, Covid) Anterior Nasal Swab     Status: None   Collection Time: 12/18/22 11:00 AM   Specimen: Anterior Nasal Swab  Result Value Ref Range Status   SARS Coronavirus 2 by RT PCR NEGATIVE NEGATIVE Final    Comment: (NOTE) SARS-CoV-2 target nucleic acids are NOT DETECTED.  The SARS-CoV-2 RNA is generally detectable in upper respiratory specimens during the acute phase of infection. The lowest concentration of SARS-CoV-2 viral copies this assay can detect is 138 copies/mL. A negative result does not preclude SARS-Cov-2 infection and should not be used as the sole basis for treatment or other patient management decisions. A negative result may occur with  improper specimen collection/handling, submission of specimen other than nasopharyngeal swab, presence of viral mutation(s) within the areas targeted by this assay, and inadequate number of viral copies(<138 copies/mL). A negative result must be combined with clinical observations, patient history, and epidemiological information. The expected result is Negative.  Fact Sheet for Patients:  BloggerCourse.com  Fact Sheet for Healthcare Providers:  SeriousBroker.it  This test is no t yet approved or cleared by the [place] FDA and  has been authorized for detection and/or diagnosis of SARS-CoV-2 by FDA under an  Emergency Use Authorization (EUA). This EUA will remain  in effect (meaning this test can be used) for the duration of the COVID-19 declaration under Section 564(b)(1) of the Act, 21 U.S.C.section 360bbb-3(b)(1), unless the authorization is terminated  or revoked sooner.       Influenza A by PCR NEGATIVE NEGATIVE Final   Influenza B by PCR NEGATIVE NEGATIVE Final    Comment: (NOTE) The Xpert Xpress SARS-CoV-2/FLU/RSV plus assay is intended as an aid in the diagnosis of influenza from Nasopharyngeal swab specimens and should not be used as a sole basis for treatment. Nasal washings and aspirates are unacceptable for Xpert Xpress SARS-CoV-2/FLU/RSV testing.  Fact Sheet for Patients: BloggerCourse.com  Fact Sheet for Healthcare Providers: SeriousBroker.it  This test is not yet approved or cleared by the [place] FDA and has been authorized for detection and/or diagnosis of SARS-CoV-2 by FDA under an Emergency Use Authorization (EUA). This EUA will remain in effect (meaning this test can be used) for the duration of the COVID-19 declaration under Section 564(b)(1) of the Act, 21 U.S.C. section 360bbb-3(b)(1), unless the authorization is terminated or revoked.     Resp Syncytial Virus by PCR NEGATIVE NEGATIVE Final    Comment: (NOTE) Fact Sheet for Patients: BloggerCourse.com  Fact Sheet for Healthcare Providers: SeriousBroker.it  This test is not yet approved or cleared by the [place] FDA and has been authorized for detection and/or diagnosis of SARS-CoV-2 by FDA under an Emergency Use Authorization (EUA). This EUA will remain in  effect (meaning this test can be used) for the duration of the COVID-19 declaration under Section 564(b)(1) of the Act, 21 U.S.C. section 360bbb-3(b)(1), unless the authorization is terminated or revoked.  Performed at [facility], [address] [place], [place] [address]   Culture, blood (Routine x 2)     Status: None (Preliminary result)   Collection Time: 12/18/22 11:05 AM   Specimen: BLOOD  Result Value Ref Range Status   Specimen Description BLOOD RIGHT ANTECUBITAL  Final   Special Requests   Final    BOTTLES DRAWN AEROBIC AND ANAEROBIC Blood Culture results may not be optimal due to an excessive volume of blood received in culture bottles   Culture   Final    NO GROWTH 1 DAY Performed at [facility], [address], [place], [place] [address]    Report Status PENDING  Incomplete    IMAGING RESULTS:  I have personally reviewed the films ?CXR cardiomegaly with interstital markings  Impression/Recommendation New fever- Unclear whether the foot is the sole reason for it R/o UTI- send urine fr culture  Diabetic foot infection rt TMA site ulceration Previously has had MRSA in culture Recently took 10 days of clinda Had fever of 103, normal wbc MRI no osteo Currently on cefepime. Azithro and vanco- Dc azithromycin Await culture to decide on PO antibiotics  Diabetic neuropathy  DM  HTN   ? ___________________________________________________ Discussed with patient, requesting provider Note:  This document was prepared using Dragon voice recognition software and may include unintentional dictation errors.

## 2022-12-19 NOTE — Evaluation (Signed)
Physical Therapy Evaluation Patient Details Name: [name] MRN: [id number] DOB: [birth year] Today's Date: 12/19/2022  History of Present Illness  Pt is a 57 y/o M admitted on 12/18/22 after presenting to the ED from the podiatry clinic for chief concerns of worsening R foot infection & failing outpatient antibiotic. Pt is being treated for sepsis 2/2 cellulitis of R foot & CAP. PMH: NIDDM, HTN, depression, R foot infection s/p guillotine amputation, neuropathy, obesity, sleep apnea  Clinical Impression  Pt seen for PT evaluation with pt agreeable to tx. Pt reports prior to admission he was independent without AD but would intermittently use knee scooter or cane but does have to use R AFO for RLE foot drop 2/2 hx of neuropathy. PT provides pt with post op shoe & educates him on MD order for heel weight bearing only. Educated pt on need to not use AFO at this time to allow wound to heal & to perform frequent skin checks & mobilize with caution 2/2 not using AFO. Pt is able to ambulate around unit, electing to push IV pole but no LOB & fair ability to demonstrate heel weight bearing. 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 At this time, pt does not require further acute PT services. PT to complete current order, please re-consult if new needs arise.   Recommendations for follow up therapy are one component of a multi-disciplinary discharge planning process, led by the attending physician.  Recommendations may be updated based on patient status, additional functional criteria and insurance authorization.  Follow Up Recommendations       Assistance Recommended at Discharge PRN  Patient can return home with the following  Assistance with cooking/housework    Equipment Recommendations None recommended by PT  Recommendations for Other Services       Functional Status Assessment Patient has not had a recent decline in  their functional status     Precautions / Restrictions Precautions Precautions: None Restrictions Weight Bearing Restrictions: Yes RLE Weight Bearing:  (heel weight bearing in post op shoe)      Mobility  Bed Mobility               General bed mobility comments: Not tested, pt received & left sitting EOB.    Transfers Overall transfer level: Independent Equipment used: None               General transfer comment: STS from EOB    Ambulation/Gait Ambulation/Gait assistance: Modified independent (Device/Increase time) Gait Distance (Feet): 160 Feet Assistive device: IV Pole (Pt elects to push IV pole.) Gait Pattern/deviations: Decreased stride length Gait velocity: decreased     General Gait Details: education/cuing re: need to limit weight bearing through R forefoot.  Stairs            Wheelchair Mobility    Modified Rankin (Stroke Patients Only)       Balance     Sitting balance-Leahy Scale: Good       Standing balance-Leahy Scale: Good                               Pertinent Vitals/Pain Pain Assessment Pain Assessment: No/denies pain    Home Living Family/patient expects to be discharged to:: Private residence Living Arrangements: Spouse/significant other Available Help at Discharge: Family Type of Home: House Home Access: Stairs to enter  Entrance Stairs-Rails: Right;Left;Can reach both Entrance Stairs-Number of Steps: 5   Home Layout: One level Home Equipment: Cane - single point (knee scooter) Additional Comments: Pt reports he currently lives with wife in 1 level home with 5 steps to enter but in a week he will be moving out to an apartment with 10 steps to enter.    Prior Function               Mobility Comments: Pt reports he's independent with intermittent use of knee scooter inside the home or long distances outside of the home (has done this for 11 years), will occasinally use SPC. Working (new job at  [employer]). Pt notes he uses R AFO 2/2 drop foot 2/2 neuropathy.       Hand Dominance        Extremity/Trunk Assessment   Upper Extremity Assessment Upper Extremity Assessment: Overall WFL for tasks assessed    Lower Extremity Assessment Lower Extremity Assessment: Overall WFL for tasks assessed;RLE deficits/detail RLE Deficits / Details: Pt reports hx of drop foot & using AFO that caused wound. R foot ace wrapped.       Communication   Communication: No difficulties  Cognition Arousal/Alertness: Awake/alert Behavior During Therapy: WFL for tasks assessed/performed Overall Cognitive Status: Within Functional Limits for tasks assessed                                          General Comments      Exercises     Assessment/Plan    PT Assessment Patient does not need any further PT services  PT Problem List         PT Treatment Interventions      PT Goals (Current goals can be found in the Care Plan section)  Acute Rehab PT Goals Patient Stated Goal: return to PLOF PT Goal Formulation: With patient Time For Goal Achievement: 01/02/23 Potential to Achieve Goals: Good    Frequency       Co-evaluation               AM-PAC PT "6 Clicks" Mobility  Outcome Measure Help needed turning from your back to your side while in a flat bed without using bedrails?: None Help needed moving from lying on your back to sitting on the side of a flat bed without using bedrails?: None Help needed moving to and from a bed to a chair (including a wheelchair)?: None Help needed standing up from a chair using your arms (e.g., wheelchair or bedside chair)?: None Help needed to walk in hospital room?: None Help needed climbing 3-5 steps with a railing? : None 6 Click Score: 24    End of Session Equipment Utilized During Treatment:  (R post op shoe) Activity Tolerance: Patient tolerated treatment well Patient left:  (ambulating in room)        Time:  0981-1914 PT Time Calculation (min) (ACUTE ONLY): 13 min   Charges:   PT Evaluation $PT Eval Low Complexity: 1 Low          Aleda Grana, PT, DPT 12/19/22, 3:53 PM   Sandi Mariscal 12/19/2022, 3:50 PM

## 2022-12-20 DIAGNOSIS — Z794 Long term (current) use of insulin: Secondary | ICD-10-CM | POA: Diagnosis not present

## 2022-12-20 DIAGNOSIS — L97412 Non-pressure chronic ulcer of right heel and midfoot with fat layer exposed: Secondary | ICD-10-CM | POA: Diagnosis not present

## 2022-12-20 DIAGNOSIS — E11621 Type 2 diabetes mellitus with foot ulcer: Secondary | ICD-10-CM | POA: Diagnosis not present

## 2022-12-20 DIAGNOSIS — E08621 Diabetes mellitus due to underlying condition with foot ulcer: Secondary | ICD-10-CM | POA: Diagnosis not present

## 2022-12-20 LAB — BASIC METABOLIC PANEL
Anion gap: 6 (ref 5–15)
BUN: 11 mg/dL (ref 6–20)
CO2: 24 mmol/L (ref 22–32)
Calcium: 7.8 mg/dL — ABNORMAL LOW (ref 8.9–10.3)
Chloride: 107 mmol/L (ref 98–111)
Creatinine, Ser: 0.7 mg/dL (ref 0.61–1.24)
GFR, Estimated: >60 mL/min (ref >60)
Glucose, Bld: 185 mg/dL — ABNORMAL HIGH (ref 70–99)
Potassium: 3.6 mmol/L (ref 3.5–5.1)
Sodium: 137 mmol/L (ref 135–145)

## 2022-12-20 LAB — GLUCOSE, CAPILLARY
Glucose-Capillary: 164 mg/dL — ABNORMAL HIGH (ref 70–99)
Glucose-Capillary: 202 mg/dL — ABNORMAL HIGH (ref 70–99)
Glucose-Capillary: 206 mg/dL — ABNORMAL HIGH (ref 70–99)
Glucose-Capillary: 261 mg/dL — ABNORMAL HIGH (ref 70–99)

## 2022-12-20 LAB — AEROBIC/ANAEROBIC CULTURE W GRAM STAIN (SURGICAL/DEEP WOUND)

## 2022-12-20 LAB — CBC
HCT: 39.4 % (ref 39.0–52.0)
Hemoglobin: 13.2 g/dL (ref 13.0–17.0)
MCH: 30.3 pg (ref 26.0–34.0)
MCHC: 33.5 g/dL (ref 30.0–36.0)
MCV: 90.4 fL (ref 80.0–100.0)
Platelets: 144 10*3/uL — ABNORMAL LOW (ref 150–400)
RBC: 4.36 MIL/uL (ref 4.22–5.81)
RDW: 14 % (ref 11.5–15.5)
WBC: 6.6 10*3/uL (ref 4.0–10.5)
nRBC: 0 % (ref 0.0–0.2)

## 2022-12-20 LAB — PHOSPHORUS: Phosphorus: 3.6 mg/dL (ref 2.5–4.6)

## 2022-12-20 LAB — LYME DISEASE SEROLOGY W/REFLEX: Lyme Total Antibody EIA: NEGATIVE

## 2022-12-20 LAB — MAGNESIUM: Magnesium: 1.9 mg/dL (ref 1.7–2.4)

## 2022-12-20 MED ORDER — ACYCLOVIR 200 MG PO CAPS: 400.0000 mg | ORAL_CAPSULE | Freq: Two times a day (BID) | ORAL | Status: DC

## 2022-12-20 MED ORDER — ACYCLOVIR 200 MG PO CAPS
400.0000 mg | ORAL_CAPSULE | Freq: Every day | ORAL | Status: DC
  Administered 2022-12-20 – 2022-12-21 (×7): 400 mg via ORAL
  Filled 2022-12-20 (×8): qty 2

## 2022-12-20 NOTE — Progress Notes (Signed)
[facility] Progress Note  Patient: [name]    ZOX:[id number]  DOA: 12/18/2022     Date of Service: the patient was seen and examined on 12/20/2022  Chief Complaint  Patient presents with   Wound Infection   Brief hospital course: Mr. 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  ED w/up: Tmax 103, heart rate 132 tachycardiac due to febrile illness, BP stable. BNP 125, slightly elevated, troponin 18, slightly elevated, lactic acid 2.8--1.5 improved, procalcitonin 0.87 slightly elevated, WBC count within normal range Blood culture pending MRI Righ Foot: 1. Apparent skin ulceration along the plantar foot at the level of the medial tarsometatarsal joint with surrounding cellulitis. No focal abscess or other fluid collection identified. 2. No evidence of osteomyelitis or septic arthropathy. Postsurgical changes related to previous transmetatarsal amputation. 3. Mild midfoot degenerative changes with partial ankylosis between the cuneiform bones and the 1st through 3rd metatarsal bases.   Assessment and Plan:  # Sepsis secondary to cellulitis of right foot Diabetic foot ulcer, failed outpatient antibiotics MRI as above, ruled out osteomyelitis Continue as needed medication for pain control Continue cefepime and vancomycin, pharmacy consulted for dosing and trough level monitoring Follow-up podiatry ID consulted, recommended to continue cefepime and vancomycin, await for cultures.   # Community-acquired pneumonia CXR  Mild diffuse bilateral interstitial opacities may represent pulmonary edema or atypical infection. Clinically patient is not having significant symptoms S/p azithromycin, discontinued  as per ID    # Hypertension, hyperlipidemia Continue irbesartan, Lipitor home medications Monitor BP and titrate medications accordingly   # NIDDM T2, held home medications for now Continue NovoLog sliding scale, monitor FSBG, continue diabetic diet Started Semglee 10 units daily # Peripheral neuropathy, started gabapentin 100 mg p.o. 3 times daily   # Hypophosphatemia, Phos repleted. Monitor electrolytes and replete as needed.  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 twice daily for prophylaxis 4/23 patient requested to increase to treatment dose so started acyclovir 400 mg p.o. 5 times per day for 7 days followed by prophylactic dose  # BPH, continued Flomax # Depression, continue Cymbalta  Body mass index is 32.86 kg/m.  Interventions:       Diet: Diabetic diet DVT Prophylaxis: Subcutaneous Heparin    Advance goals of care discussion: Full code  Family Communication: family was not present at bedside, at the time of interview.  The pt provided permission to discuss medical plan with the family. Opportunity was given to ask question and all questions were answered satisfactorily.   Disposition:  Pt is from Home, admitted with right foot cellulitis, still on IV antibiotics, which precludes a safe discharge. Discharge to home, when cleared by podiatry.  Subjective: No significant events overnight, patient is currently pain-free, denies any pain in the right foot, left eye seems to be improving but he is requesting to start treatment dose of acyclovir 5 times a day. Denies any chest pain or palpitation, no shortness of breath, no any other active issues.  Physical Exam: General: NAD, lying comfortably Appear in no distress, affect appropriate Eyes: PERRLA ENT: Oral Mucosa Clear, moist  Neck: no JVD,  Cardiovascular: S1 and S2 Present, no Murmur,  Respiratory: good respiratory effort, Bilateral Air entry equal and Decreased, no Crackles,  no wheezes Abdomen: Bowel  Sound present, Soft and no tenderness,  Skin: no rashes Extremities: no Pedal edema, no calf tenderness, Right foot erythematous and mild swelling, s/p TMA Neurologic: without any new focal findings Gait not checked due to patient safety concerns  Vitals:   12/19/22 1521 12/19/22 2024 12/20/22 0549 12/20/22 0740  BP: 119/71 (!) 112/57 124/77 124/84  Pulse: 81 78 76 74  Resp: Temp: 98 F (36.7 C) 98.5 F (36.9 C) 97.7 F (36.5 C) 97.9 F (36.6 C)  TempSrc:  Oral Oral Oral  SpO2: 97% 100% 98% 96%  Weight:      Height:        Intake/Output Summary (Last 24 hours) at 12/20/2022 1509 Last data filed at 12/20/2022 1309 Gross per 24 hour  Intake 1490 ml  Output --  Net 1490 ml   Filed Weights   12/18/22 1103  Weight: 103.9 kg    Data Reviewed: I have personally reviewed and interpreted daily labs, tele strips, imagings as discussed above. I reviewed all nursing notes, pharmacy notes, vitals, pertinent old records I have discussed plan of care as described above with RN and patient/family.  CBC: Recent Labs  Lab 12/18/22 1100 12/19/22 0600 12/20/22 0511  WBC 9.2 8.0 6.6  NEUTROABS 7.9*  --   --   HGB 15.6 13.2 13.2  HCT 47.0 39.5 39.4  MCV 90.4 91.6 90.4  PLT 143* 125* 144*   Basic Metabolic Panel: Recent Labs  Lab 12/18/22 1100 12/19/22 0600 12/20/22 0511  NA 137 134* 137  K 3.8 3.6 3.6  CL 105 108 107  CO2 20* 21* 24  GLUCOSE 175* 305* 185*  BUN CREATININE 0.94 0.88 0.70  CALCIUM 8.6* 7.6* 7.8*  MG  --  2.0 1.9  PHOS  --  2.1* 3.6    Studies: No results found.  Scheduled Meds:  acyclovir  400 mg Oral 5 X Daily   Followed by   Melene Muller ON 12/27/2022] acyclovir  400 mg Oral BID   aspirin EC  81 mg Oral Daily   atorvastatin  20 mg Oral QHS   gabapentin  100 mg Oral TID   heparin  5,000 Units Subcutaneous Q8H   insulin aspart  0-15 Units Subcutaneous TID WC   insulin aspart  0-5 Units Subcutaneous QHS    insulin glargine-yfgn  10 Units Subcutaneous Daily   irbesartan  75 mg Oral QHS   multivitamin with minerals   Oral Daily   tamsulosin  0.4 mg Oral Daily   Continuous Infusions:  ceFEPime (MAXIPIME) IV 2 g (12/20/22 1344)   vancomycin 1,250 mg (12/20/22 1021)   PRN Meds: acetaminophen **OR** acetaminophen, albuterol, calcium carbonate, diphenhydrAMINE, melatonin, ondansetron **OR** ondansetron (ZOFRAN) IV, oxyCODONE, polyethylene glycol, senna-docusate  Time spent: 35 minutes  Author: Gillis Santa. MD [facility] 12/20/2022 3:09 PM  To reach On-call, see care teams to locate the attending and reach out to them via [email or web address]. If 7PM-7AM, please contact night-coverage If you still have difficulty reaching the attending provider, please page the [facility] (Director on Call) for [facility] on amion for assistance.

## 2022-12-20 NOTE — Inpatient Diabetes Management (Signed)
Inpatient Diabetes Program Recommendations  AACE/ADA: New Consensus Statement on Inpatient Glycemic Control (2015)  Target Ranges:  Prepandial:   less than 140 mg/dL      Peak postprandial:   less than 180 mg/dL (1-2 hours)      Critically ill patients:  140 - 180 mg/dL    Latest Reference Range & Units 12/19/22 07:24 12/19/22 12:02 12/19/22 16:17 12/19/22 20:23  Glucose-Capillary 70 - 99 mg/dL 161 (H)  5 units Novolog  10 units Semglee  288 (H)  8 units Novolog  336 (H)  11 units Novolog  237 (H)  2 units Novolog   (H): Data is abnormally high  Latest Reference Range & Units 12/20/22 08:14 12/20/22 11:59  Glucose-Capillary 70 - 99 mg/dL 096 (H)  3 units Novolog  10 units Semglee   202 (H)  5 units Novolog   (H): Data is abnormally high    History: DM2   Home DM Meds: Metformin 500 mg daily   Current Orders: Novolog Moderate Correction Scale/ SSI (0-15 units) TID AC + HS     Semglee 10 units Daily     MD- Note Semglee started yest AM.  Home Metformin remains on hold.    May consider adding low dose Novolog Meal Coverage for in-hospital glucose control:  Novolog 3 units TID with meals HOLD if pt NPO HOLD if pt eats <50% meals    --Will follow patient during hospitalization--  Ambrose Finland RN, MSN, CDCES Diabetes Coordinator Inpatient Glycemic Control Team Team Pager: [phone] (8a-5p)

## 2022-12-20 NOTE — TOC CM/SW Note (Signed)
  Transition of Care [facility] Screening Note   Patient Details  Name: [name] Date of Birth: [birth year]   Transition of Care [facility]) CM/SW Contact:    Allena Katz, LCSW Phone Number: 12/20/2022, 2:31 PM    Transition of Care Department [facility] has reviewed patient and no TOC needs have been identified at this time. We will continue to monitor patient advancement through interdisciplinary progression rounds. If new patient transition needs arise, please place a TOC consult.

## 2022-12-20 NOTE — Progress Notes (Addendum)
Date of Admission:  12/18/2022    ID: [name] is a 57 y.o. male  Principal Problem:   Diabetic ulcer of foot associated with diabetes mellitus due to underlying condition, with fat layer exposed Active Problems:   OSA (obstructive sleep apnea)   Obesity (BMI 30-39.9)   Hyperlipidemia associated with type 2 diabetes mellitus   Polyneuropathy, peripheral sensorimotor axonal   S/P transmetatarsal amputation of foot, right   Major depressive disorder, recurrent   Constipation   Diabetes mellitus type 2, noninsulin dependent   Sepsis due to cellulitis   Atypical pneumonia    Subjective: Doing well No complaints  Medications:   acyclovir  400 mg Oral 5 X Daily   Followed by   Melene Muller ON 12/27/2022] acyclovir  400 mg Oral BID   aspirin EC  81 mg Oral Daily   atorvastatin  20 mg Oral QHS   gabapentin  100 mg Oral TID   heparin  5,000 Units Subcutaneous Q8H   insulin aspart  0-15 Units Subcutaneous TID WC   insulin aspart  0-5 Units Subcutaneous QHS   insulin glargine-yfgn  10 Units Subcutaneous Daily   irbesartan  75 mg Oral QHS   multivitamin with minerals   Oral Daily   tamsulosin  0.4 mg Oral Daily    Objective: Vital signs in last 24 hours: Patient Vitals for the past 24 hrs:  BP Temp Temp src Pulse Resp SpO2  12/20/22 1937 124/78 98 F (36.7 C) Oral 71 19 99 %  12/20/22 1521 131/65 (!) 97.4 F (36.3 C) -- 77 19 100 %  12/20/22 0740 124/84 97.9 F (36.6 C) Oral 74 20 96 %  12/20/22 0549 124/77 97.7 F (36.5 C) Oral 76 18 98 %     LDA Foley Central lines Other catheters  PHYSICAL EXAM:  General: Alert, cooperative, no distress, appears stated age.  Head: Normocephalic, without obvious abnormality, atraumatic. Eyes: Conjunctivae clear, anicteric sclerae. Pupils are equal ENT Nares normal. No drainage or sinus tenderness. Lips, mucosa, and tongue normal. No Thrush Neck: Supple, symmetrical, no adenopathy, thyroid: non tender no carotid bruit and no  JVD. Back: No CVA tenderness. Lungs: Clear to auscultation bilaterally. No Wheezing or Rhonchi. No rales. Heart: Regular rate and rhythm, no murmur, rub or gallop. Abdomen: Soft, non-tender,not distended. Bowel sounds normal. No masses Extremities: rt TMA Covered with dressing     Skin: No rashes or lesions. Or bruising Lymph: Cervical, supraclavicular normal. Neurologic: Grossly non-focal  Lab Results    Latest Ref Rng & Units 12/20/2022    5:11 AM 12/19/2022    6:00 AM 12/18/2022   11:00 AM  CBC  WBC 4.0 - 10.5 K/uL 6.6  8.0  9.2   Hemoglobin 13.0 - 17.0 g/dL 16.1  09.6  04.5   Hematocrit 39.0 - 52.0 % 39.4  39.5  47.0   Platelets 150 - 400 K/uL 144  125  143        Latest Ref Rng & Units 12/20/2022    5:11 AM 12/19/2022    6:00 AM 12/18/2022   11:00 AM  CMP  Glucose 70 - 99 mg/dL 409  811  914   BUN 6 - 20 mg/dL Creatinine 0.61 - 1.24 mg/dL 7.82  9.56  2.13   Sodium 135 - 145 mmol/L 137  134  137   Potassium 3.5 - 5.1 mmol/L 3.6  3.6  3.8   Chloride 98 - 111 mmol/L  107  108  105   CO2 22 - 32 mmol/L Calcium 8.9 - 10.3 mg/dL 7.8  7.6  8.6   Total Protein 6.5 - 8.1 g/dL   7.7   Total Bilirubin 0.3 - 1.2 mg/dL   1.8   Alkaline Phos 38 - 126 U/L   50   AST 15 - 41 U/L   41   ALT 0 - 44 U/L   36       Microbiology: BC-NG UC-pending WC-pending     Assessment/Plan: New fever- Unclear whether the foot is the sole reason for it R/o UTI- sent urine for culture   Diabetic foot infection rt TMA site ulceration Previously has had MRSA in culture Recently took 10 days of clinda Had fever of 103, normal wbc- he is feeling much better MRI no osteo Currently on cefepime and vanco- Dc azithromycin Await culture to decide on PO antibiotic - if not available by noon  can send on bactrim _ augmentin for 2 weeks and then adjust antibiotic as per culture- will need K/Cr checked next week while on bactrim   Diabetic neuropathy   DM    HTN  Discussed the management with patient and hospitalist Dr.Kumar  [facility] physician will cover while I am away from 12/21/22

## 2022-12-20 NOTE — Progress Notes (Signed)
Daily Progress Note   Subjective  - * No surgery found *  Follow-up right foot ulcerations.  Patient lying in bed resting comfortably.  Objective Vitals:   12/19/22 1521 12/19/22 2024 12/20/22 0549 12/20/22 0740  BP: 119/71 (!) 112/57 124/77 124/84  Pulse: 81 78 76 74  Resp: Temp: 98 F (36.7 C) 98.5 F (36.9 C) 97.7 F (36.5 C) 97.9 F (36.6 C)  TempSrc:  Oral Oral Oral  SpO2: 97% 100% 98% 96%  Weight:      Height:        Physical Exam: 2 ulcerative sites to the right foot.  Lateral ulceration is very superficial in nature.  Just mild mixed fibrotic granular tissue.  Healthy looking.  Plantar foot ulceration is very small in nature.  Only probes about 2 to 3 mm.  No purulence today.  Ulcer itself is less than 5 mm in diameter.  Laboratory CBC    Component Value Date/Time   WBC 6.6 12/20/2022 0511   HGB 13.2 12/20/2022 0511   HCT 39.4 12/20/2022 0511   PLT 144 (L) 12/20/2022 0511    BMET    Component Value Date/Time   NA 137 12/20/2022 0511   NA 138 04/08/2017 0000   K 3.6 12/20/2022 0511   CL 107 12/20/2022 0511   CO2 24 12/20/2022 0511   GLUCOSE 185 (H) 12/20/2022 0511   BUN 11 12/20/2022 0511   BUN 13 04/08/2017 0000   CREATININE 0.70 12/20/2022 0511   CREATININE 0.89 02/15/2019 1442   CALCIUM 7.8 (L) 12/20/2022 0511   GFRNONAA >60 12/20/2022 0511   GFRAA >60 09/21/2015 1941    Assessment/Planning: Diabetic neuropathic ulcerations as stated above  New dressing applied.  Patient has been performing dressings at home with calcium alginate with silver with silicone bordered foam dressings.  He has already been established with [facility] wound care center.  I would recommend continued use of the current dressing changes that he has been doing at home and should follow-up closely with his wound care provider at [facility] in [place].  Can continue with current dressings while in house.  Podiatry will sign off.   Gwyneth Revels A  12/20/2022, 1:44 PM

## 2022-12-21 DIAGNOSIS — Z6832 Body mass index (BMI) 32.0-32.9, adult: Secondary | ICD-10-CM

## 2022-12-21 DIAGNOSIS — B9562 Methicillin resistant Staphylococcus aureus infection as the cause of diseases classified elsewhere: Secondary | ICD-10-CM

## 2022-12-21 DIAGNOSIS — R509 Fever, unspecified: Secondary | ICD-10-CM | POA: Diagnosis not present

## 2022-12-21 DIAGNOSIS — E08621 Diabetes mellitus due to underlying condition with foot ulcer: Secondary | ICD-10-CM

## 2022-12-21 DIAGNOSIS — G4733 Obstructive sleep apnea (adult) (pediatric): Secondary | ICD-10-CM

## 2022-12-21 DIAGNOSIS — L03115 Cellulitis of right lower limb: Secondary | ICD-10-CM

## 2022-12-21 DIAGNOSIS — L97412 Non-pressure chronic ulcer of right heel and midfoot with fat layer exposed: Secondary | ICD-10-CM | POA: Diagnosis not present

## 2022-12-21 DIAGNOSIS — Z89422 Acquired absence of other left toe(s): Secondary | ICD-10-CM

## 2022-12-21 DIAGNOSIS — E1142 Type 2 diabetes mellitus with diabetic polyneuropathy: Secondary | ICD-10-CM

## 2022-12-21 DIAGNOSIS — E669 Obesity, unspecified: Secondary | ICD-10-CM

## 2022-12-21 LAB — URINE CULTURE: Culture: NO GROWTH

## 2022-12-21 LAB — BASIC METABOLIC PANEL
Anion gap: 6 (ref 5–15)
BUN: 13 mg/dL (ref 6–20)
CO2: 22 mmol/L (ref 22–32)
Calcium: 8.3 mg/dL — ABNORMAL LOW (ref 8.9–10.3)
Chloride: 109 mmol/L (ref 98–111)
Creatinine, Ser: 0.72 mg/dL (ref 0.61–1.24)
GFR, Estimated: >60 mL/min (ref >60)
Glucose, Bld: 166 mg/dL — ABNORMAL HIGH (ref 70–99)
Potassium: 3.6 mmol/L (ref 3.5–5.1)
Sodium: 137 mmol/L (ref 135–145)

## 2022-12-21 LAB — CBC
HCT: 40.9 % (ref 39.0–52.0)
Hemoglobin: 14.1 g/dL (ref 13.0–17.0)
MCH: 30.8 pg (ref 26.0–34.0)
MCHC: 34.5 g/dL (ref 30.0–36.0)
MCV: 89.3 fL (ref 80.0–100.0)
Platelets: 178 10*3/uL (ref 150–400)
RBC: 4.58 MIL/uL (ref 4.22–5.81)
RDW: 13.9 % (ref 11.5–15.5)
WBC: 6.7 10*3/uL (ref 4.0–10.5)
nRBC: 0 % (ref 0.0–0.2)

## 2022-12-21 LAB — AEROBIC/ANAEROBIC CULTURE W GRAM STAIN (SURGICAL/DEEP WOUND)

## 2022-12-21 LAB — MAGNESIUM: Magnesium: 2 mg/dL (ref 1.7–2.4)

## 2022-12-21 LAB — GLUCOSE, CAPILLARY
Glucose-Capillary: 241 mg/dL — ABNORMAL HIGH (ref 70–99)
Glucose-Capillary: 306 mg/dL — ABNORMAL HIGH (ref 70–99)

## 2022-12-21 LAB — PHOSPHORUS: Phosphorus: 3.6 mg/dL (ref 2.5–4.6)

## 2022-12-21 MED ORDER — INSULIN ASPART 100 UNIT/ML IJ SOLN
4.0000 [IU] | Freq: Three times a day (TID) | INTRAMUSCULAR | Status: DC
  Administered 2022-12-21: 4 [IU] via SUBCUTANEOUS
  Filled 2022-12-21: qty 1

## 2022-12-21 MED ORDER — GABAPENTIN 100 MG PO CAPS: 100.0000 mg | ORAL_CAPSULE | Freq: Three times a day (TID) | ORAL | 0 refills | Status: DC

## 2022-12-21 MED ORDER — AMOXICILLIN-POT CLAVULANATE 875-125 MG PO TABS: 1.0000 | ORAL_TABLET | Freq: Two times a day (BID) | ORAL | 0 refills | Status: DC

## 2022-12-21 MED ORDER — SULFAMETHOXAZOLE-TRIMETHOPRIM 800-160 MG PO TABS: 1.0000 | ORAL_TABLET | Freq: Two times a day (BID) | ORAL | 0 refills | Status: DC

## 2022-12-21 MED ORDER — SULFAMETHOXAZOLE-TRIMETHOPRIM 800-160 MG PO TABS: 1.0000 | ORAL_TABLET | Freq: Two times a day (BID) | ORAL | Status: DC

## 2022-12-21 MED ORDER — AMOXICILLIN-POT CLAVULANATE 875-125 MG PO TABS: 1.0000 | ORAL_TABLET | Freq: Two times a day (BID) | ORAL | Status: DC

## 2022-12-21 MED ORDER — INSULIN GLARGINE-YFGN 100 UNIT/ML ~~LOC~~ SOLN: 12.0000 [IU] | Freq: Every day | SUBCUTANEOUS | Status: DC

## 2022-12-21 MED ORDER — DOXYCYCLINE HYCLATE 100 MG PO TABS: 100.0000 mg | ORAL_TABLET | Freq: Two times a day (BID) | ORAL | Status: DC

## 2022-12-21 NOTE — Progress Notes (Addendum)
[facility] Infectious Diseases Follow Up Note  Patient Identification: Patient Name: [name] MRN: [id number] Admit Date: 12/18/2022 10:54 AM Age: 57 y.o.Today's Date: 12/21/2022  Reason for Visit: fevers, cellulitis   Principal Problem:   Diabetic ulcer of foot associated with diabetes mellitus due to underlying condition, with fat layer exposed Active Problems:   OSA (obstructive sleep apnea)   Obesity (BMI 30-39.9)   Hyperlipidemia associated with type 2 diabetes mellitus   Polyneuropathy, peripheral sensorimotor axonal   S/P transmetatarsal amputation of foot, right   Major depressive disorder, recurrent   Constipation   Diabetes mellitus type 2, noninsulin dependent   Sepsis due to cellulitis   Atypical pneumonia   Current Antibiotics:  Vancomycin and cefepime   Lines/Hardwares:   Interval Events: afebrile, wound cx 4/22 incubating    Assessment # Fevers - unclear source ? RT TMA Ulcer cellulitis ( states he did not have any issues until he was told to Rt TMA site is red and warm when presented to Ed) vs UTI ( denies dysuria). No respiratory symptoms. No localising signs. Overall improving and will treat for cellulitis as planned.  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 EOT 12/31/22 Patient reports he has a fu on 5/2 with PCP and he knows to check k/Cr He follows with his podiatrist at [facility] and will continue to follow there  Fu OR cx to completion, otherwise ID will SO Recall if needed  D/w primary and ID pharm D  Rest of the management as per the primary team. Thank you for the consult. Please page with pertinent questions or  concerns.  ______________________________________________________________________ Subjective patient seen and examined at the bedside. No complaints. Doing well and eager to go home.    Past Medical History:  Diagnosis Date   Acute prostatitis without hematuria 05/20/2022   Allergy    Complication of anesthesia    pt states at [facility] he stopped breathing due to his sleep apnea-hard to wake up   COVID-19    Depression    Diabetic ulcer of both feet    DM (diabetes mellitus), type 2    History of kidney stones    History of methicillin resistant staphylococcus aureus (MRSA) 09/2020   foot   Neuromuscular disorder    neuropathy of back and bilateral feet   Obesity    Sleep apnea    does not use cpap   Past Surgical History:  Procedure Laterality Date   AMPUTATION Left    toe amputation   CYSTOSCOPY/URETEROSCOPY/HOLMIUM LASER/STENT PLACEMENT Right 07/29/2022   Procedure: CYSTOSCOPY/URETEROSCOPY/HOLMIUM LASER/STENT PLACEMENT;  Surgeon: Sondra Come, MD;  Location: [facility] ORS;  Service: Urology;  Laterality: Right;   FOOT SURGERY Right 09/2012   cyst removed   SPINE SURGERY  2004   nerve damage in spine   TOE AMPUTATION Right 06/30/2016   5 toes    Vitals BP 136/70 (BP Location: Right Arm)   Pulse 82   Temp 97.9 F (36.6 C)   Resp 16   Ht 5\' 10"  (1.778 m)   Wt 103.9 kg   SpO2 98%   BMI 32.86 kg/m     Physical Exam Constitutional:  adult male sitting in the chair and talking on phone  Comments:   Cardiovascular:     Rate and Rhythm: Normal rate and regular rhythm.     Heart sounds: s1s2  Pulmonary:     Effort: Pulmonary effort is normal on room air     Comments: Normal breath sounds  Abdominal:     Palpations: Abdomen is soft.     Tenderness: non distended and non tender   Musculoskeletal:        General: No swelling or tenderness in peripheral joints. RT TMA site is wrapped in a bandage C/D/I, no surrounding cellulitis. Left 4th toe amputated site has  healed        Skin:    Comments: No rashes.   Neurological:     General: Awake, alert and oriented, following commands   Psychiatric:        Mood and Affect: Mood normal.   Pertinent Microbiology Results for orders placed or performed during the hospital encounter of 12/18/22  Culture, blood (Routine x 2)     Status: None (Preliminary result)   Collection Time: 12/18/22 11:00 AM   Specimen: BLOOD  Result Value Ref Range Status   Specimen Description BLOOD LEFT ANTECUBITAL  Final   Special Requests   Final    BOTTLES DRAWN AEROBIC AND ANAEROBIC Blood Culture adequate volume   Culture   Final    NO GROWTH 3 DAYS Performed at [facility], [address], [place], [place] [address]    Report Status PENDING  Incomplete  Resp panel by RT-PCR (RSV, Flu A&B, Covid) Anterior Nasal Swab     Status: None   Collection Time: 12/18/22 11:00 AM   Specimen: Anterior Nasal Swab  Result Value Ref Range Status   SARS Coronavirus 2 by RT PCR NEGATIVE NEGATIVE Final    Comment: (NOTE) SARS-CoV-2 target nucleic acids are NOT DETECTED.  The SARS-CoV-2 RNA is generally detectable in upper respiratory specimens during the acute phase of infection. The lowest concentration of SARS-CoV-2 viral copies this assay can detect is 138 copies/mL. A negative result does not preclude SARS-Cov-2 infection and should not be used as the sole basis for treatment or other patient management decisions. A negative result may occur with  improper specimen collection/handling, submission of specimen other than nasopharyngeal swab, presence of viral mutation(s) within the areas targeted by this assay, and inadequate number of viral copies(<138 copies/mL). A negative result must be combined with clinical observations, patient history, and epidemiological information. The expected result is Negative.  Fact Sheet for Patients:  BloggerCourse.com  Fact Sheet for Healthcare  Providers:  SeriousBroker.it  This test is no t yet approved or cleared by the [place] FDA and  has been authorized for detection and/or diagnosis of SARS-CoV-2 by FDA under an Emergency Use Authorization (EUA). This EUA will remain  in effect (meaning this test can be used) for the duration of the COVID-19 declaration under Section 564(b)(1) of the Act, 21 U.S.C.section 360bbb-3(b)(1), unless the authorization is terminated  or revoked sooner.       Influenza A by PCR NEGATIVE NEGATIVE Final   Influenza B by PCR NEGATIVE NEGATIVE Final    Comment: (NOTE) The Xpert Xpress SARS-CoV-2/FLU/RSV plus assay is intended as an aid in the diagnosis of influenza from Nasopharyngeal swab specimens and should not be used as a sole basis for treatment. Nasal washings and aspirates are unacceptable for Xpert Xpress SARS-CoV-2/FLU/RSV testing.  Fact Sheet for Patients: BloggerCourse.com  Fact Sheet for Healthcare Providers: SeriousBroker.it  This test is not yet approved  or cleared by the [place] and has been authorized for detection and/or diagnosis of SARS-CoV-2 by FDA under an Emergency Use Authorization (EUA). This EUA will remain in effect (meaning this test can be used) for the duration of the COVID-19 declaration under Section 564(b)(1) of the Act, 21 U.S.C. section 360bbb-3(b)(1), unless the authorization is terminated or revoked.     Resp Syncytial Virus by PCR NEGATIVE NEGATIVE Final    Comment: (NOTE) Fact Sheet for Patients: BloggerCourse.com  Fact Sheet for Healthcare Providers: SeriousBroker.it  This test is not yet approved or cleared by the [place] FDA and has been authorized for detection and/or diagnosis of SARS-CoV-2 by FDA under an Emergency Use Authorization (EUA). This EUA will remain in effect (meaning this test can be  used) for the duration of the COVID-19 declaration under Section 564(b)(1) of the Act, 21 U.S.C. section 360bbb-3(b)(1), unless the authorization is terminated or revoked.  Performed at [facility], [address] [place], [place] [address]   Culture, blood (Routine x 2)     Status: None (Preliminary result)   Collection Time: 12/18/22 11:05 AM   Specimen: BLOOD  Result Value Ref Range Status   Specimen Description BLOOD RIGHT ANTECUBITAL  Final   Special Requests   Final    BOTTLES DRAWN AEROBIC AND ANAEROBIC Blood Culture results may not be optimal due to an excessive volume of blood received in culture bottles   Culture   Final    NO GROWTH 3 DAYS Performed at [facility], [address] [place], [place] [address]    Report Status PENDING  Incomplete  Aerobic/Anaerobic Culture w Gram Stain (surgical/deep wound)     Status: None (Preliminary result)   Collection Time: 12/19/22 12:51 PM   Specimen: Foot; Wound  Result Value Ref Range Status   Specimen Description   Final    FOOT RIGHT Performed at [facility], [address] [place], [place] [address]    Special Requests   Final    NONE Performed at [facility], [address] [place], [place] [address]    Gram Stain PENDING  Incomplete   Culture   Final    CULTURE REINCUBATED FOR BETTER GROWTH Performed at [facility], [address] [place], [place] [address]    Report Status PENDING  Incomplete  Urine Culture (for pregnant, neutropenic or urologic patients or patients with an indwelling urinary catheter)     Status: None   Collection Time: 12/20/22  5:00 AM   Specimen: Urine, Clean Catch  Result Value Ref Range Status   Specimen Description   Final    URINE, CLEAN CATCH Performed at [facility], [address], [place], [place] [address]    Special Requests   Final    NONE Performed at [facility], [address] [place], [place]  [address]    Culture   Final    NO GROWTH Performed at [facility], [address], [place], [place] [address]    Report Status 12/21/2022 FINAL  Final  '   Pertinent Lab.    Latest Ref Rng & Units 12/21/2022    5:35 AM 12/20/2022    5:11 AM 12/19/2022    6:00 AM  CBC  WBC 4.0 - 10.5 K/uL 6.7  6.6  8.0   Hemoglobin 13.0 - 17.0 g/dL 28.4  13.2  44.0   Hematocrit 39.0 - 52.0 % 40.9  39.4  39.5  Platelets 150 - 400 K/uL 178  144  125       Latest Ref Rng & Units 12/21/2022    5:35 AM 12/20/2022    5:11 AM 12/19/2022    6:00 AM  CMP  Glucose 70 - 99 mg/dL 161  096  045   BUN 6 - 20 mg/dL 13  11  14    Creatinine 0.61 - 1.24 mg/dL 4.09  8.11  9.14   Sodium 135 - 145 mmol/L 137  137  134   Potassium 3.5 - 5.1 mmol/L 3.6  3.6  3.6   Chloride 98 - 111 mmol/L 109  107  108   CO2 22 - 32 mmol/L 22  24  21    Calcium 8.9 - 10.3 mg/dL 8.3  7.8  7.6      Pertinent Imaging today Plain films and CT images have been personally visualized and interpreted; radiology reports have been reviewed. Decision making incorporated into the Impression / Recommendations.  MR FOOT RIGHT W WO CONTRAST  Result Date: 12/19/2022 CLINICAL DATA:  Worsening diabetic foot ulcer.  Prior amputation. EXAM: MRI OF THE RIGHT FOREFOOT WITHOUT AND WITH CONTRAST TECHNIQUE: Multiplanar, multisequence MR imaging of the right foot was performed before and after the administration of intravenous contrast. CONTRAST:  10mL GADAVIST GADOBUTROL 1 MMOL/ML IV SOLN COMPARISON:  Radiographs 12/18/2022 and 02/12/2013. Right foot MRI 02/15/2013. FINDINGS: Bones/Joint/Cartilage As demonstrated on the recent radiographs, the patient is status post transmetatarsal amputation. The remaining metatarsal bases demonstrate no suspicious marrow signal, enhancement or cortical destruction to suggest osteomyelitis. There is partial ankylosis between the cuneiform bones and the 1st through 3rd metatarsal bases. There are no significant joint  effusions. Mild midfoot degenerative changes are present. There is a tubular area of signal change extending through the talar head and adjacent navicular which is probably postsurgical. This demonstrates no suspicious enhancement. The tibiotalar joint appears unremarkable. Ligaments No significant ligamentous abnormalities. Muscles and Tendons Postsurgical changes related to previous transmetatarsal amputation. Associated lower leg and hindfoot atrophy. No focal intramuscular fluid collection, abnormal enhancement or tenosynovitis. The ankle tendons appear intact. Soft tissues There is postsurgical susceptibility artifact medially in the remaining forefoot. There is an area of apparent skin ulceration along the plantar foot at the level of the medial tarsometatarsal joint. Inflammatory changes are present throughout the surrounding subcutaneous fat with T2 hyperintensity and low level enhancement. No focal fluid collections are identified. Mild nonspecific subcutaneous edema extends into the dorsal aspect of the midfoot and distal lower leg. IMPRESSION: 1. Apparent skin ulceration along the plantar foot at the level of the medial tarsometatarsal joint with surrounding cellulitis. No focal abscess or other fluid collection identified. 2. No evidence of osteomyelitis or septic arthropathy. Postsurgical changes related to previous transmetatarsal amputation. 3. Mild midfoot degenerative changes with partial ankylosis between the cuneiform bones and the 1st through 3rd metatarsal bases. Electronically Signed   By: Carey Bullocks M.D.   On: 12/19/2022 07:44   DG Foot Complete Right  Result Date: 12/18/2022 CLINICAL DATA:  Wound the patient's stump. EXAM: RIGHT FOOT COMPLETE - 3+ VIEW COMPARISON:  Foot radiographs dated 02/12/2013. FINDINGS: The patient is status post a forefoot resection at the level of the proximal metatarsals. There is no evidence of fracture or dislocation. No focal osseous demineralization to  suggest osteomyelitis. Degenerative changes are seen in the midfoot as well as a plantar calcaneal enthesophyte. There is soft tissue swelling around the foot. IMPRESSION: No radiographic evidence of osteomyelitis. Electronically Signed  By: Romona Curls M.D.   On: 12/18/2022 11:58   DG Chest Port 1 View  Result Date: 12/18/2022 CLINICAL DATA:  Sepsis EXAM: PORTABLE CHEST 1 VIEW COMPARISON:  None Available. FINDINGS: The heart is enlarged. Mild diffuse bilateral interstitial opacities are noted. There is no pleural effusion or pneumothorax. The osseous structures appear intact. IMPRESSION: 1. Mild diffuse bilateral interstitial opacities may represent pulmonary edema or atypical infection. 2. Cardiomegaly. Electronically Signed   By: Romona Curls M.D.   On: 12/18/2022 11:56     I have personally spent 50 minutes involved in face-to-face and non-face-to-face activities for this patient on the day of the visit. Professional time spent includes the following activities: Preparing to see the patient (review of tests), Obtaining and/or reviewing separately obtained history (admission/discharge record), Performing a medically appropriate examination and/or evaluation , Ordering medications/tests/procedures, referring and communicating with other health care professionals, Documenting clinical information in the EMR, Independently interpreting results (not separately reported), Communicating results to the patient/family/caregiver, Counseling and educating the patient/family/caregiver and Care coordination (not separately reported).    Electronically signed by:   Odette Fraction, MD Infectious Disease Physician [facility] for Infectious Disease Pager: [phone]

## 2022-12-21 NOTE — Discharge Summary (Signed)
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 due to underlying condition, with fat layer exposed Active Problems:   Sepsis due to cellulitis   Atypical pneumonia   OSA (obstructive sleep apnea)   Obesity (BMI 30-39.9)   Hyperlipidemia associated with type 2 diabetes mellitus   Polyneuropathy, peripheral sensorimotor axonal   S/P transmetatarsal amputation of foot, right   Major depressive disorder, recurrent   Constipation   Diabetes mellitus type 2, noninsulin dependent   Admitted From: Home Disposition:  {comingfrom:22515} ***  Recommendations for Outpatient Follow-up:  Follow-up with PCP in 1 week, check BMP after 1 week to see creatinine level Follow-up with podiatry in 1 week Follow up LABS/TEST:  ***   Diet recommendation: {dietplan:22518}  Activity: The patient is advised to gradually reintroduce usual activities, as tolerated  Discharge Condition: stable  Code Status: {Palliative Code status:23503}   History of present illness: As per the H and P dictated on admission, "***"  Hospital Course:  *** Summary of his active problems in the hospital is as following.   *** Body mass index is 32.86 kg/m.    Nutrition Interventions:        ***Pain control  - [place] Controlled Substance Reporting System database was reviewed. - *** day supply was provided. - Patient was instructed, not to drive, operate heavy machinery, perform activities at heights, swimming or participation in water activities or provide baby sitting services while on Pain, Sleep and Anxiety Medications; until his outpatient Physician has advised to do so again.  - Also recommended to not to take more than prescribed Pain, Sleep and Anxiety  Medications.  ***Patient was ambulatory without any assistance. ***Patient was seen by physical therapy, who recommended {d/c therapy plan:22521}, ***which was arranged. On the day of the discharge the patient's vitals were stable, and no other acute medical condition were reported by patient. the patient was felt safe to be discharge at {comingfrom:22515} with {d/c therapy plan:22521}.  Consultants: *** Procedures: ***  Discharge Exam: General: Appear in no distress, no Rash; Oral Mucosa Clear, moist. Cardiovascular: S1 and S2 Present, no Murmur, Respiratory: normal respiratory effort, Bilateral Air entry present and no Crackles, no wheezes Abdomen: Bowel Sound present, Soft and no tenderness, no hernia Extremities: no Pedal edema, no calf tenderness Neurology: alert and oriented to time, place, and person affect appropriate.  Filed Weights   12/18/22 1103  Weight: 103.9 kg   Vitals:   12/21/22 0745 12/21/22 1145  BP: 136/70 (!) 145/70  Pulse: 82 75  Resp: 16 16  Temp: 97.9 F (36.6 C) 98 F (36.7 C)  SpO2: 98% 98%    DISCHARGE MEDICATION: Allergies as of 12/21/2022       Reactions   Vancomycin Itching   Other reaction(s): Other (See Comments) Red mans        Medication List     STOP taking these medications    DULoxetine 30 MG capsule Commonly known as: CYMBALTA       TAKE these medications    amoxicillin-clavulanate 875-125 MG tablet Commonly known as: AUGMENTIN Take 1 tablet by mouth every 12 (twelve) hours for 10 days.   aspirin EC 81 MG tablet Take 81 mg by mouth daily. Swallow whole.   atorvastatin 20 MG tablet Commonly  known as: LIPITOR TAKE 1 TABLET BY MOUTH EVERY DAY   gabapentin 100 MG capsule Commonly known as: NEURONTIN Take 1 capsule (100 mg total) by mouth 3 (three) times daily.   metFORMIN 500 MG 24 hr tablet Commonly known as: GLUCOPHAGE-XR TAKE 1 TABLET BY MOUTH EVERY DAY WITH BREAKFAST What changed:  how much to take how  to take this when to take this additional instructions   multivitamin with minerals tablet Take 1 tablet by mouth daily.   oxyCODONE 5 MG immediate release tablet Commonly known as: Roxicodone Take 1.5 tablets (7.5 mg total) by mouth daily. As needed for severe pain   sulfamethoxazole-trimethoprim 800-160 MG tablet Commonly known as: BACTRIM DS Take 1 tablet by mouth every 12 (twelve) hours for 10 days.   tamsulosin 0.4 MG Caps capsule Commonly known as: FLOMAX Take 0.4 mg by mouth daily.   telmisartan 20 MG tablet Commonly known as: MICARDIS Take 1 tablet (20 mg total) by mouth at bedtime.               Discharge Care Instructions  (From admission, onward)           Start     Ordered   12/21/22 0000  Discharge wound care:       Comments: As above   12/21/22 1320           Allergies  Allergen Reactions   Vancomycin Itching    Other reaction(s): Other (See Comments) Red mans   Discharge Instructions     Call MD for:  extreme fatigue   Complete by: As directed    Call MD for:  persistant dizziness or light-headedness   Complete by: As directed    Call MD for:  redness, tenderness, or signs of infection (pain, swelling, redness, odor or green/yellow discharge around incision site)   Complete by: As directed    Call MD for:  severe uncontrolled pain   Complete by: As directed    Call MD for:  temperature >100.4   Complete by: As directed    Diet - low sodium heart healthy   Complete by: As directed    Discharge instructions   Complete by: As directed    Follow-up with PCP in 1 week, check BMP after 1 week to see creatinine level Follow-up with podiatry in 1 week   Discharge wound care:   Complete by: As directed    As above   Increase activity slowly   Complete by: As directed        The results of significant diagnostics from this hospitalization (including imaging, microbiology, ancillary and laboratory) are listed below for reference.     Significant Diagnostic Studies: MR FOOT RIGHT W WO CONTRAST  Result Date: 12/19/2022 CLINICAL DATA:  Worsening diabetic foot ulcer.  Prior amputation. EXAM: MRI OF THE RIGHT FOREFOOT WITHOUT AND WITH CONTRAST TECHNIQUE: Multiplanar, multisequence MR imaging of the right foot was performed before and after the administration of intravenous contrast. CONTRAST:  10mL GADAVIST GADOBUTROL 1 MMOL/ML IV SOLN COMPARISON:  Radiographs 12/18/2022 and 02/12/2013. Right foot MRI 02/15/2013. FINDINGS: Bones/Joint/Cartilage As demonstrated on the recent radiographs, the patient is status post transmetatarsal amputation. The remaining metatarsal bases demonstrate no suspicious marrow signal, enhancement or cortical destruction to suggest osteomyelitis. There is partial ankylosis between the cuneiform bones and the 1st through 3rd metatarsal bases. There are no significant joint effusions. Mild midfoot degenerative changes are present. There is a tubular area of signal change extending through the talar  head and adjacent navicular which is probably postsurgical. This demonstrates no suspicious enhancement. The tibiotalar joint appears unremarkable. Ligaments No significant ligamentous abnormalities. Muscles and Tendons Postsurgical changes related to previous transmetatarsal amputation. Associated lower leg and hindfoot atrophy. No focal intramuscular fluid collection, abnormal enhancement or tenosynovitis. The ankle tendons appear intact. Soft tissues There is postsurgical susceptibility artifact medially in the remaining forefoot. There is an area of apparent skin ulceration along the plantar foot at the level of the medial tarsometatarsal joint. Inflammatory changes are present throughout the surrounding subcutaneous fat with T2 hyperintensity and low level enhancement. No focal fluid collections are identified. Mild nonspecific subcutaneous edema extends into the dorsal aspect of the midfoot and distal lower leg.  IMPRESSION: 1. Apparent skin ulceration along the plantar foot at the level of the medial tarsometatarsal joint with surrounding cellulitis. No focal abscess or other fluid collection identified. 2. No evidence of osteomyelitis or septic arthropathy. Postsurgical changes related to previous transmetatarsal amputation. 3. Mild midfoot degenerative changes with partial ankylosis between the cuneiform bones and the 1st through 3rd metatarsal bases. Electronically Signed   By: Carey Bullocks M.D.   On: 12/19/2022 07:44   DG Foot Complete Right  Result Date: 12/18/2022 CLINICAL DATA:  Wound the patient's stump. EXAM: RIGHT FOOT COMPLETE - 3+ VIEW COMPARISON:  Foot radiographs dated 02/12/2013. FINDINGS: The patient is status post a forefoot resection at the level of the proximal metatarsals. There is no evidence of fracture or dislocation. No focal osseous demineralization to suggest osteomyelitis. Degenerative changes are seen in the midfoot as well as a plantar calcaneal enthesophyte. There is soft tissue swelling around the foot. IMPRESSION: No radiographic evidence of osteomyelitis. Electronically Signed   By: Romona Curls M.D.   On: 12/18/2022 11:58   DG Chest Port 1 View  Result Date: 12/18/2022 CLINICAL DATA:  Sepsis EXAM: PORTABLE CHEST 1 VIEW COMPARISON:  None Available. FINDINGS: The heart is enlarged. Mild diffuse bilateral interstitial opacities are noted. There is no pleural effusion or pneumothorax. The osseous structures appear intact. IMPRESSION: 1. Mild diffuse bilateral interstitial opacities may represent pulmonary edema or atypical infection. 2. Cardiomegaly. Electronically Signed   By: Romona Curls M.D.   On: 12/18/2022 11:56    Microbiology: Recent Results (from the past 240 hour(s))  Culture, blood (Routine x 2)     Status: None (Preliminary result)   Collection Time: 12/18/22 11:00 AM   Specimen: BLOOD  Result Value Ref Range Status   Specimen Description BLOOD LEFT ANTECUBITAL   Final   Special Requests   Final    BOTTLES DRAWN AEROBIC AND ANAEROBIC Blood Culture adequate volume   Culture   Final    NO GROWTH 3 DAYS Performed at [facility], [address], [place], [place] [address]    Report Status PENDING  Incomplete  Resp panel by RT-PCR (RSV, Flu A&B, Covid) Anterior Nasal Swab     Status: None   Collection Time: 12/18/22 11:00 AM   Specimen: Anterior Nasal Swab  Result Value Ref Range Status   SARS Coronavirus 2 by RT PCR NEGATIVE NEGATIVE Final    Comment: (NOTE) SARS-CoV-2 target nucleic acids are NOT DETECTED.  The SARS-CoV-2 RNA is generally detectable in upper respiratory specimens during the acute phase of infection. The lowest concentration of SARS-CoV-2 viral copies this assay can detect is 138 copies/mL. A negative result does not preclude SARS-Cov-2 infection and should not be used as the sole basis for treatment or other patient management decisions.  A negative result may occur with  improper specimen collection/handling, submission of specimen other than nasopharyngeal swab, presence of viral mutation(s) within the areas targeted by this assay, and inadequate number of viral copies(<138 copies/mL). A negative result must be combined with clinical observations, patient history, and epidemiological information. The expected result is Negative.  Fact Sheet for Patients:  BloggerCourse.com  Fact Sheet for Healthcare Providers:  SeriousBroker.it  This test is no t yet approved or cleared by the [place] FDA and  has been authorized for detection and/or diagnosis of SARS-CoV-2 by FDA under an Emergency Use Authorization (EUA). This EUA will remain  in effect (meaning this test can be used) for the duration of the COVID-19 declaration under Section 564(b)(1) of the Act, 21 U.S.C.section 360bbb-3(b)(1), unless the authorization is terminated  or revoked sooner.        Influenza A by PCR NEGATIVE NEGATIVE Final   Influenza B by PCR NEGATIVE NEGATIVE Final    Comment: (NOTE) The Xpert Xpress SARS-CoV-2/FLU/RSV plus assay is intended as an aid in the diagnosis of influenza from Nasopharyngeal swab specimens and should not be used as a sole basis for treatment. Nasal washings and aspirates are unacceptable for Xpert Xpress SARS-CoV-2/FLU/RSV testing.  Fact Sheet for Patients: BloggerCourse.com  Fact Sheet for Healthcare Providers: SeriousBroker.it  This test is not yet approved or cleared by the [place] FDA and has been authorized for detection and/or diagnosis of SARS-CoV-2 by FDA under an Emergency Use Authorization (EUA). This EUA will remain in effect (meaning this test can be used) for the duration of the COVID-19 declaration under Section 564(b)(1) of the Act, 21 U.S.C. section 360bbb-3(b)(1), unless the authorization is terminated or revoked.     Resp Syncytial Virus by PCR NEGATIVE NEGATIVE Final    Comment: (NOTE) Fact Sheet for Patients: BloggerCourse.com  Fact Sheet for Healthcare Providers: SeriousBroker.it  This test is not yet approved or cleared by the [place] FDA and has been authorized for detection and/or diagnosis of SARS-CoV-2 by FDA under an Emergency Use Authorization (EUA). This EUA will remain in effect (meaning this test can be used) for the duration of the COVID-19 declaration under Section 564(b)(1) of the Act, 21 U.S.C. section 360bbb-3(b)(1), unless the authorization is terminated or revoked.  Performed at [facility], [address] [place], [place] [address]   Culture, blood (Routine x 2)     Status: None (Preliminary result)   Collection Time: 12/18/22 11:05 AM   Specimen: BLOOD  Result Value Ref Range Status   Specimen Description BLOOD RIGHT ANTECUBITAL  Final   Special Requests    Final    BOTTLES DRAWN AEROBIC AND ANAEROBIC Blood Culture results may not be optimal due to an excessive volume of blood received in culture bottles   Culture   Final    NO GROWTH 3 DAYS Performed at [facility], [address] [place], [place] [address]    Report Status PENDING  Incomplete  Aerobic/Anaerobic Culture w Gram Stain (surgical/deep wound)     Status: None (Preliminary result)   Collection Time: 12/19/22 12:51 PM   Specimen: Foot; Wound  Result Value Ref Range Status   Specimen Description   Final    FOOT RIGHT Performed at [facility], [address] [place], [place] [address]    Special Requests   Final    NONE Performed at [facility], [address] [place], [place] [address]    Gram Stain PENDING  Incomplete  Culture   Final    ABUNDANT ENTEROCOCCUS FAECALIS RARE KLEBSIELLA PNEUMONIAE NO ANAEROBES ISOLATED; CULTURE IN PROGRESS FOR 5 DAYS    Report Status PENDING  Incomplete   Organism ID, Bacteria ENTEROCOCCUS FAECALIS  Final   Organism ID, Bacteria KLEBSIELLA PNEUMONIAE  Final      Susceptibility   Enterococcus faecalis - MIC*    AMPICILLIN <=2 SENSITIVE Sensitive     VANCOMYCIN 1 SENSITIVE Sensitive     GENTAMICIN SYNERGY SENSITIVE Sensitive     * ABUNDANT ENTEROCOCCUS FAECALIS   Klebsiella pneumoniae - MIC*    AMPICILLIN RESISTANT Resistant     CEFEPIME <=0.12 SENSITIVE Sensitive     CEFTAZIDIME <=1 SENSITIVE Sensitive     CEFTRIAXONE <=0.25 SENSITIVE Sensitive     CIPROFLOXACIN <=0.25 SENSITIVE Sensitive     GENTAMICIN <=1 SENSITIVE Sensitive     IMIPENEM <=0.25 SENSITIVE Sensitive     TRIMETH/SULFA <=20 SENSITIVE Sensitive     AMPICILLIN/SULBACTAM <=2 SENSITIVE Sensitive     PIP/TAZO <=4 SENSITIVE Sensitive     * RARE KLEBSIELLA PNEUMONIAE  Urine Culture (for pregnant, neutropenic or urologic patients or patients with an indwelling urinary catheter)     Status: None   Collection Time: 12/20/22  5:00 AM    Specimen: Urine, Clean Catch  Result Value Ref Range Status   Specimen Description   Final    URINE, CLEAN CATCH Performed at [facility], [address], [place], [place] [address]    Special Requests   Final    NONE Performed at [facility], [address], [place], [place] [address]    Culture   Final    NO GROWTH Performed at [facility], [address], [place], [place] [address]    Report Status 12/21/2022 FINAL  Final     Labs: CBC: Recent Labs  Lab 12/18/22 1100 12/19/22 0600 12/20/22 0511 12/21/22 0535  WBC 9.2 8.0 6.6 6.7  NEUTROABS 7.9*  --   --   --   HGB 15.6 13.2 13.2 14.1  HCT 47.0 39.5 39.4 40.9  MCV 90.4 91.6 90.4 89.3  PLT 143* 125* 144* 178   Basic Metabolic Panel: Recent Labs  Lab 12/18/22 1100 12/19/22 0600 12/20/22 0511 12/21/22 0535  NA 137 134* 137 137  K 3.8 3.6 3.6 3.6  CL 105 108 107 109  CO2 20* 21* 24 22  GLUCOSE 175* 305* 185* 166*  BUN 15 14 11 13   CREATININE 0.94 0.88 0.70 0.72  CALCIUM 8.6* 7.6* 7.8* 8.3*  MG  --  2.0 1.9 2.0  PHOS  --  2.1* 3.6 3.6   Liver Function Tests: Recent Labs  Lab 12/18/22 1100  AST 41  ALT 36  ALKPHOS 50  BILITOT 1.8*  PROT 7.7  ALBUMIN 3.8   No results for input(s): "LIPASE", "AMYLASE" in the last 168 hours. No results for input(s): "AMMONIA" in the last 168 hours. Cardiac Enzymes: No results for input(s): "CKTOTAL", "CKMB", "CKMBINDEX", "TROPONINI" in the last 168 hours. BNP (last 3 results) Recent Labs    12/18/22 1252  BNP 125.4*   CBG: Recent Labs  Lab 12/20/22 1159 12/20/22 1606 12/20/22 1940 12/21/22 0742 12/21/22 1143  GLUCAP 202* 206* 261* 241* 306*    Time spent: 35 minutes  Signed:  Gillis Santa  [facility]  ***12/21/2022 1:20 PM

## 2022-12-21 NOTE — Inpatient Diabetes Management (Signed)
Inpatient Diabetes Program Recommendations  AACE/ADA: New Consensus Statement on Inpatient Glycemic Control   Target Ranges:  Prepandial:   less than 140 mg/dL      Peak postprandial:   less than 180 mg/dL (1-2 hours)      Critically ill patients:  140 - 180 mg/dL    Latest Reference Range & Units 12/20/22 08:14 12/20/22 11:59 12/20/22 16:06 12/20/22 19:40 12/21/22 07:42  Glucose-Capillary 70 - 99 mg/dL 161 (H) 096 (H) 045 (H) 261 (H) 241 (H)   Review of Glycemic Control  Diabetes history: DM2 Outpatient Diabetes medications: Metformin XR 500 mg QAM Current orders for Inpatient glycemic control: Semglee 10 units daily, Novolog 0-15 units TID with meals, Novolog 0-5 units QHS  Inpatient Diabetes Program Recommendations:    Insulin: Fasting CBG 241 mg/dl today.  Please consider increasing Semglee to 12 units daily and ordering Novolog 4 units TID with meals for meal coverage if patient eats at least 50% of meals.  Thanks, Orlando Penner, RN, MSN, CDCES Diabetes Coordinator Inpatient Diabetes Program [phone] (Team Pager from 8am to 5pm)

## 2022-12-22 ENCOUNTER — Telehealth: Payer: Self-pay

## 2022-12-22 NOTE — Transitions of Care (Post Inpatient/ED Visit) (Signed)
   12/22/2022  Name: [name] MRN: [id number] DOB: 07/21/1966  Today's TOC FU Call Status: Today's TOC FU Call Status:: Successful TOC FU Call Competed TOC FU Call Complete Date: 12/22/22  Transition Care Management Follow-up Telephone Call Date of Discharge: 12/21/22 Discharge Facility: [facility]) Type of Discharge: Inpatient Admission Primary Inpatient Discharge Diagnosis:: cellulitis How have you been since you were released from the hospital?: Better Any questions or concerns?: No  Items Reviewed: Did you receive and understand the discharge instructions provided?: Yes Medications obtained and verified?: Yes (Medications Reviewed) Any new allergies since your discharge?: No Dietary orders reviewed?: Yes Do you have support at home?: Yes People in Home: spouse  Home Care and Equipment/Supplies: Were Home Health Services Ordered?: NA Any new equipment or medical supplies ordered?: NA  Functional Questionnaire: Do you need assistance with bathing/showering or dressing?: No Do you need assistance with meal preparation?: No Do you need assistance with eating?: No Do you have difficulty maintaining continence: No Do you need assistance with getting out of bed/getting out of a chair/moving?: No Do you have difficulty managing or taking your medications?: No  Follow up appointments reviewed: PCP Follow-up appointment confirmed?: Yes Date of PCP follow-up appointment?: 12/29/22 Follow-up Provider: [facility] Follow-up appointment confirmed?: NA Do you need transportation to your follow-up appointment?: No Do you understand care options if your condition(s) worsen?: Yes-patient verbalized understanding    SIGNATURE Karena Addison, LPN [facility] Nurse Health Advisor Direct Dial [phone]

## 2022-12-23 LAB — CULTURE, BLOOD (ROUTINE X 2)
Culture: NO GROWTH
Culture: NO GROWTH
Special Requests: ADEQUATE

## 2022-12-24 LAB — SPOTTED FEVER GROUP ANTIBODIES
Spotted Fever Group IgG: <1:64 {titer}
Spotted Fever Group IgM: <1:64 {titer}

## 2022-12-24 LAB — AEROBIC/ANAEROBIC CULTURE W GRAM STAIN (SURGICAL/DEEP WOUND)

## 2022-12-27 LAB — HM DIABETES EYE EXAM

## 2022-12-29 ENCOUNTER — Ambulatory Visit: Payer: BC Managed Care – PPO | Admitting: Internal Medicine

## 2022-12-29 ENCOUNTER — Telehealth: Payer: Self-pay | Admitting: *Deleted

## 2022-12-29 VITALS — BP 128/74 | HR 80 | Temp 98.0°F | Resp 16 | Ht 70.0 in | Wt 226.0 lb

## 2022-12-29 DIAGNOSIS — Z7984 Long term (current) use of oral hypoglycemic drugs: Secondary | ICD-10-CM | POA: Diagnosis not present

## 2022-12-29 DIAGNOSIS — L97412 Non-pressure chronic ulcer of right heel and midfoot with fat layer exposed: Secondary | ICD-10-CM | POA: Diagnosis not present

## 2022-12-29 DIAGNOSIS — L0390 Cellulitis, unspecified: Secondary | ICD-10-CM | POA: Diagnosis not present

## 2022-12-29 DIAGNOSIS — E11621 Type 2 diabetes mellitus with foot ulcer: Secondary | ICD-10-CM | POA: Diagnosis not present

## 2022-12-29 DIAGNOSIS — R944 Abnormal results of kidney function studies: Secondary | ICD-10-CM

## 2022-12-29 DIAGNOSIS — E08621 Diabetes mellitus due to underlying condition with foot ulcer: Secondary | ICD-10-CM

## 2022-12-29 DIAGNOSIS — E119 Type 2 diabetes mellitus without complications: Secondary | ICD-10-CM

## 2022-12-29 DIAGNOSIS — J189 Pneumonia, unspecified organism: Secondary | ICD-10-CM

## 2022-12-29 DIAGNOSIS — A419 Sepsis, unspecified organism: Secondary | ICD-10-CM

## 2022-12-29 DIAGNOSIS — L03115 Cellulitis of right lower limb: Secondary | ICD-10-CM

## 2022-12-29 DIAGNOSIS — Z09 Encounter for follow-up examination after completed treatment for conditions other than malignant neoplasm: Secondary | ICD-10-CM

## 2022-12-29 LAB — BASIC METABOLIC PANEL
BUN: 19 mg/dL (ref 6–23)
CO2: 26 mEq/L (ref 19–32)
Calcium: 9.1 mg/dL (ref 8.4–10.5)
Chloride: 102 mEq/L (ref 96–112)
Creatinine, Ser: 0.87 mg/dL (ref 0.40–1.50)
GFR: 95.19 mL/min (ref >60.00)
Glucose, Bld: 235 mg/dL — ABNORMAL HIGH (ref 70–99)
Potassium: 4.3 mEq/L (ref 3.5–5.1)
Sodium: 136 mEq/L (ref 135–145)

## 2022-12-29 LAB — CBC WITH DIFFERENTIAL/PLATELET
Basophils Absolute: 0.3 10*3/uL — ABNORMAL HIGH (ref 0.0–0.1)
Basophils Relative: 3.3 % — ABNORMAL HIGH (ref 0.0–3.0)
Eosinophils Absolute: 0.4 10*3/uL (ref 0.0–0.7)
Eosinophils Relative: 4.4 % (ref 0.0–5.0)
HCT: 44.9 % (ref 39.0–52.0)
Hemoglobin: 15.4 g/dL (ref 13.0–17.0)
Lymphocytes Relative: 23.4 % (ref 12.0–46.0)
Lymphs Abs: 1.8 10*3/uL (ref 0.7–4.0)
MCHC: 34.2 g/dL (ref 30.0–36.0)
MCV: 91.1 fl (ref 78.0–100.0)
Monocytes Absolute: 1 10*3/uL (ref 0.1–1.0)
Monocytes Relative: 13.2 % — ABNORMAL HIGH (ref 3.0–12.0)
Neutro Abs: 4.4 10*3/uL (ref 1.4–7.7)
Neutrophils Relative %: 55.7 % (ref 43.0–77.0)
Platelets: 315 10*3/uL (ref 150.0–400.0)
RBC: 4.92 Mil/uL (ref 4.22–5.81)
RDW: 14.6 % (ref 11.5–15.5)
WBC: 7.9 10*3/uL (ref 4.0–10.5)

## 2022-12-29 LAB — HEMOGLOBIN A1C: Hgb A1c MFr Bld: 7 % — ABNORMAL HIGH (ref 4.6–6.5)

## 2022-12-29 NOTE — Progress Notes (Signed)
Subjective:  Patient ID: [name], male    DOB: 1965-12-07  Age: 57 y.o. MRN: [id number]  CC: The primary encounter diagnosis was Decreased GFR. Diagnoses of Diabetes mellitus type 2, noninsulin dependent (HCC), Sepsis due to cellulitis [facility]), Diabetic ulcer of right midfoot associated with type 2 diabetes mellitus, with fat layer exposed (HCC), Atypical pneumonia, Cellulitis of right lower extremity, Diabetes mellitus treated with oral medication (HCC), Diabetic ulcer of right midfoot associated with diabetes mellitus due to underlying condition, with fat layer exposed [facility]), and Hospital discharge follow-up were also pertinent to this visit.   HPI [name] presents for hospital follow up.  Chief Complaint  Patient presents with   Medical Management of Chronic Issues   PATIENT presetnts for a visit  scheduled 3 MONTHS AGO FOR DIABETES FOLLOW UP,  BUT WAS Admitted to [facility] with cellulitis of foot secondary to diabetic foot ulcer.  On April 21. Marland Kitchen He states that he had discovered a new ulcer on his right foot 6 to 8 weeks ago (several weeks after his last in person follow up visit" with  [facility]. He contacted wound center on April 3 with pictures of the wound, and was prescribed clindamycin and x rays were ordered. Marland Kitchen  Ulcer did not improve and he devloped rigors and chills on April 21 .   EMS took him to [facility].  CXR noted infiltrates suggestive of atypical PNA.  MRI  of foot was done to rule out  osteomyelitis. Was given PRN medication for pain control. Received  cefepime and vancomycin,  ID and   Podiatry was consulted, Dr Ether Griffins saw patient and recommended no surgical intervention and  continue cefepime and vancomycin, changed to oral abx when Right foot wound culture grew Klebsiella and Enterococcus faecalis.  Was discharged on Augmentin and Bactrim for total 2 weeks antibiotics end date 12/31/2022.  Not taking a probiotic  but eating yogurt occasionally. No diarrhea   The cause   of the ulcer was due to an ill fitting brace and a new set of shoes which made the brace tighter  on foot which created an ulcer , per patient .  He has been waiting for a new brace which was ordered one month ago by [employer] .  Patient no longer sees s not have a podiatrist  .  Gets regular wound care at [facility] He has made contact with the [facility]   who is aware of his recent hospitalization but has not given him an appt. Yet.  Wears a brace and gets inserts and shoes from [facility] in [place] [phone]  christian   Type 2 DM: Blood sugars have been elevated  200 fasting since discharge .  Taking Metformin only.  He declines the use of insulin .    Depression/reaction:  wife has separated from him after 2 years of conflict;  has has had to move out .  He is currently  financially  strapped due to several  large necessary expenditures .  but he is employed and will be in a better financial state in a few months. He is disinclined to schedule a follow up visit with the [facility] because his out of poicket cost is 200 , for unclear reasons     Outpatient Medications Prior to Visit  Medication Sig Dispense Refill   aspirin EC 81 MG tablet Take 81 mg by mouth daily. Swallow whole.     atorvastatin (LIPITOR) 20  MG tablet TAKE 1 TABLET BY MOUTH EVERY DAY 90 tablet 3   metFORMIN (GLUCOPHAGE-XR) 500 MG 24 hr tablet TAKE 1 TABLET BY MOUTH EVERY DAY WITH BREAKFAST (Patient taking differently: Take 500 mg by mouth daily with breakfast.) 90 tablet 1   Multiple Vitamins-Minerals (MULTIVITAMIN WITH MINERALS) tablet Take 1 tablet by mouth daily.     oxyCODONE (ROXICODONE) 5 MG immediate release tablet Take 1.5 tablets (7.5 mg total) by mouth daily. As needed for severe pain 45 tablet 0   tamsulosin (FLOMAX) 0.4 MG CAPS capsule Take 0.4 mg by mouth daily.     telmisartan (MICARDIS) 20 MG tablet Take 1 tablet (20 mg total) by mouth at bedtime. 90 tablet 0   amoxicillin-clavulanate  (AUGMENTIN) 875-125 MG tablet Take 1 tablet by mouth every 12 (twelve) hours for 10 days. 20 tablet 0   gabapentin (NEURONTIN) 100 MG capsule Take 1 capsule (100 mg total) by mouth 3 (three) times daily. 90 capsule 0   sulfamethoxazole-trimethoprim (BACTRIM DS) 800-160 MG tablet Take 1 tablet by mouth every 12 (twelve) hours for 10 days. 20 tablet 0   No facility-administered medications prior to visit.    Review of Systems;  Patient denies headache, fevers, malaise, unintentional weight loss, skin rash, eye pain, sinus congestion and sinus pain, sore throat, dysphagia,  hemoptysis , cough, dyspnea, wheezing, chest pain, palpitations, orthopnea, edema, abdominal pain, nausea, melena, diarrhea, constipation, flank pain, dysuria, hematuria, urinary  Frequency, nocturia, numbness, tingling, seizures,  Focal weakness, Loss of consciousness,  Tremor, insomnia, depression, anxiety, and suicidal ideation.      Objective:  BP 128/74   Pulse 80   Temp 98 F (36.7 C)   Resp 16   Ht 5\' 10"  (1.778 m)   Wt 226 lb (102.5 kg)   SpO2 97%   BMI 32.43 kg/m   BP Readings from Last 3 Encounters:  12/29/22 128/74  12/21/22 (!) 145/70  10/04/22 118/70    Wt Readings from Last 3 Encounters:  12/29/22 226 lb (102.5 kg)  12/18/22 229 lb (103.9 kg)  10/04/22 228 lb 9.6 oz (103.7 kg)    Physical Exam Vitals reviewed.  Constitutional:      General: He is not in acute distress.    Appearance: Normal appearance. He is normal weight. He is not ill-appearing, toxic-appearing or diaphoretic.  HENT:     Head: Normocephalic.  Eyes:     General: No scleral icterus.       Right eye: No discharge.        Left eye: No discharge.     Conjunctiva/sclera: Conjunctivae normal.  Cardiovascular:     Rate and Rhythm: Normal rate and regular rhythm.     Heart sounds: Normal heart sounds.  Pulmonary:     Effort: Pulmonary effort is normal. No respiratory distress.     Breath sounds: Normal breath sounds.   Musculoskeletal:        General: Deformity present. Normal range of motion.     Cervical back: Normal range of motion.     Right Lower Extremity: (forefoot amputated) Feet:     Right foot:     Skin integrity: Ulcer, skin breakdown and callus present. No erythema or warmth.  Skin:    General: Skin is warm and dry.  Neurological:     General: No focal deficit present.     Mental Status: He is alert and oriented to person, place, and time. Mental status is at baseline.  Psychiatric:  Mood and Affect: Mood normal.        Behavior: Behavior normal.        Thought Content: Thought content normal.        Judgment: Judgment normal.     Lab Results  Component Value Date   HGBA1C 7.0 (H) 12/29/2022   HGBA1C 6.3 09/21/2022   HGBA1C 5.6 02/22/2022    Lab Results  Component Value Date   CREATININE 0.87 12/29/2022   CREATININE 0.72 12/21/2022   CREATININE 0.70 12/20/2022    Lab Results  Component Value Date   WBC 7.9 12/29/2022   HGB 15.4 12/29/2022   HCT 44.9 12/29/2022   PLT 315.0 12/29/2022   GLUCOSE 235 (H) 12/29/2022   CHOL 116 09/21/2022   TRIG 92.0 09/21/2022   HDL 37.30 (L) 09/21/2022   LDLDIRECT 121.0 11/27/2019   LDLCALC 61 09/21/2022   ALT 36 12/18/2022   AST 41 12/18/2022   NA 136 12/29/2022   K 4.3 12/29/2022   CL 102 12/29/2022   CREATININE 0.87 12/29/2022   BUN 19 12/29/2022   CO2 26 12/29/2022   TSH 0.59 02/22/2022   PSA 0.18 02/22/2022   INR 1.3 (H) 12/19/2022   HGBA1C 7.0 (H) 12/29/2022   MICROALBUR 1.2 10/04/2022    MR FOOT RIGHT W WO CONTRAST  Result Date: 12/19/2022 CLINICAL DATA:  Worsening diabetic foot ulcer.  Prior amputation. EXAM: MRI OF THE RIGHT FOREFOOT WITHOUT AND WITH CONTRAST TECHNIQUE: Multiplanar, multisequence MR imaging of the right foot was performed before and after the administration of intravenous contrast. CONTRAST:  10mL GADAVIST GADOBUTROL 1 MMOL/ML IV SOLN COMPARISON:  Radiographs 12/18/2022 and 02/12/2013. Right  foot MRI 02/15/2013. FINDINGS: Bones/Joint/Cartilage As demonstrated on the recent radiographs, the patient is status post transmetatarsal amputation. The remaining metatarsal bases demonstrate no suspicious marrow signal, enhancement or cortical destruction to suggest osteomyelitis. There is partial ankylosis between the cuneiform bones and the 1st through 3rd metatarsal bases. There are no significant joint effusions. Mild midfoot degenerative changes are present. There is a tubular area of signal change extending through the talar head and adjacent navicular which is probably postsurgical. This demonstrates no suspicious enhancement. The tibiotalar joint appears unremarkable. Ligaments No significant ligamentous abnormalities. Muscles and Tendons Postsurgical changes related to previous transmetatarsal amputation. Associated lower leg and hindfoot atrophy. No focal intramuscular fluid collection, abnormal enhancement or tenosynovitis. The ankle tendons appear intact. Soft tissues There is postsurgical susceptibility artifact medially in the remaining forefoot. There is an area of apparent skin ulceration along the plantar foot at the level of the medial tarsometatarsal joint. Inflammatory changes are present throughout the surrounding subcutaneous fat with T2 hyperintensity and low level enhancement. No focal fluid collections are identified. Mild nonspecific subcutaneous edema extends into the dorsal aspect of the midfoot and distal lower leg. IMPRESSION: 1. Apparent skin ulceration along the plantar foot at the level of the medial tarsometatarsal joint with surrounding cellulitis. No focal abscess or other fluid collection identified. 2. No evidence of osteomyelitis or septic arthropathy. Postsurgical changes related to previous transmetatarsal amputation. 3. Mild midfoot degenerative changes with partial ankylosis between the cuneiform bones and the 1st through 3rd metatarsal bases. Electronically Signed   By:  Carey Bullocks M.D.   On: 12/19/2022 07:44   DG Foot Complete Right  Result Date: 12/18/2022 CLINICAL DATA:  Wound the patient's stump. EXAM: RIGHT FOOT COMPLETE - 3+ VIEW COMPARISON:  Foot radiographs dated 02/12/2013. FINDINGS: The patient is status post a forefoot resection at the level of  the proximal metatarsals. There is no evidence of fracture or dislocation. No focal osseous demineralization to suggest osteomyelitis. Degenerative changes are seen in the midfoot as well as a plantar calcaneal enthesophyte. There is soft tissue swelling around the foot. IMPRESSION: No radiographic evidence of osteomyelitis. Electronically Signed   By: Romona Curls M.D.   On: 12/18/2022 11:58   DG Chest Port 1 View  Result Date: 12/18/2022 CLINICAL DATA:  Sepsis EXAM: PORTABLE CHEST 1 VIEW COMPARISON:  None Available. FINDINGS: The heart is enlarged. Mild diffuse bilateral interstitial opacities are noted. There is no pleural effusion or pneumothorax. The osseous structures appear intact. IMPRESSION: 1. Mild diffuse bilateral interstitial opacities may represent pulmonary edema or atypical infection. 2. Cardiomegaly. Electronically Signed   By: Romona Curls M.D.   On: 12/18/2022 11:56    Assessment & Plan:  .Decreased GFR -     Basic metabolic panel  Diabetes mellitus type 2, noninsulin dependent (HCC) Assessment & Plan: Currenlty uncontrolled after over a year of control..  likely due to diet and recent infection.  He refuses to use insulin.  Sample Free Style Libre 3 CBG monitor placed on patient today for 2 week surveillance of BS   Orders: -     Hemoglobin A1c  Sepsis due to cellulitis (HCC) -     CBC with Differential/Platelet  Diabetic ulcer of right midfoot associated with type 2 diabetes mellitus, with fat layer exposed (HCC)  Atypical pneumonia Assessment & Plan: He is asymptomatic and received empiric antibiotics for cellulitis which would have covered any infection    Cellulitis of  right lower extremity Assessment & Plan: Resolved currently.  His ulcer is clean based and there is no erythema   Diabetes mellitus treated with oral medication [facility]) Assessment & Plan: Currenlty uncontrolled after over a year of control..  likely due to diet and recent infection.  He refuses to use insulin.  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  The ulcer is on lateral plantar distal end of the Right foot with history of transmetatarsal amputation. Patient was prescribed clindamycin 150 mg p.o. 3 times daily for 10 days by [facility] wound clinic;  completed antibiotic on 12/11/2022,.  He was admitted to [facility] for failing outpatient antibiotics and received empiric  cefepime and vancomycin for wound infection  pending wound cultures.  MRI of the right foot with contrast ruled out osteomyelitis .  Dr. Excell Seltzer was consulted for  unassigned podiatry consultation.  He received  Oxycodone 5 mg p.o. every 6 hours as needed for moderate pain,   he was discharged on augmentin    Hospital discharge follow-up Assessment & Plan: Patient is stable post discharge and has no new issues or questions about discharge plans at the visit today for hospital follow up.  I have reviewed the records from the hospital admission in detail with patient today.       I provided 30 minutes of face-to-face time during this encounter reviewing patient's last visit with me, patient's  most recent visit with cardiology,  nephrology,  and neurology,  recent surgical and non surgical procedures, previous  labs and imaging studies, counseling on currently addressed issues,  and post visit ordering to diagnostics and therapeutics .   Follow-up: Return in  about 3 months (around  03/30/2023) for follow up diabetes.   Sherlene Shams, MD

## 2022-12-29 NOTE — Progress Notes (Signed)
  Care Coordination  Outreach Note  12/29/2022 Name: [name] MRN: [id number] DOB: 23-Mar-1966   Care Coordination Outreach Attempts: An unsuccessful telephone outreach was attempted today to offer the patient information about available care coordination services.  Follow Up Plan:  Additional outreach attempts will be made to offer the patient care coordination information and services.   Encounter Outcome:  No Answer  Burman Nieves, CCMA Care Coordination Care Guide Direct Dial: [phone]

## 2022-12-29 NOTE — Patient Instructions (Signed)
I WANT YOU TO WEAR THE FREE STYLE GLUCOSE MONITOR FOR THE NEXT 2 WEEKS so your blood sugars can be monitored 24/7  before I make any medication changes  Daily use of a probiotic advised for 3 weeks since you have been taking antibiotics (yogurt is fine)   Use a mirror to check your foot ulcers  DAILY and call me or the wound clinic if you start to see a change for the worse

## 2022-12-31 DIAGNOSIS — Z09 Encounter for follow-up examination after completed treatment for conditions other than malignant neoplasm: Secondary | ICD-10-CM | POA: Insufficient documentation

## 2022-12-31 NOTE — Assessment & Plan Note (Signed)
Patient is stable post discharge and has no new issues or questions about discharge plans at the visit today for hospital follow up.  I have reviewed the records from the hospital admission in detail with patient today. 

## 2022-12-31 NOTE — Assessment & Plan Note (Signed)
Currenlty uncontrolled after over a year of control..  likely due to diet and recent infection.  He refuses to use insulin.  Sample Free Style Libre 3 CBG monitor placed on patient today for 2 week surveillance of BS

## 2022-12-31 NOTE — Assessment & Plan Note (Signed)
He is asymptomatic and received empiric antibiotics for cellulitis which would have covered any infection

## 2022-12-31 NOTE — Assessment & Plan Note (Addendum)
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  The ulcer is on lateral plantar distal end of the Right foot with history of transmetatarsal amputation. Patient was prescribed clindamycin 150 mg p.o. 3 times daily for 10 days by [facility] wound clinic;  completed antibiotic on 12/11/2022,.  He was admitted to [facility] for failing outpatient antibiotics and received empiric  cefepime and vancomycin for wound infection  pending wound cultures.  MRI of the right foot with contrast ruled out osteomyelitis .  Dr. Excell Seltzer was consulted for  unassigned podiatry consultation.  He received  Oxycodone 5 mg p.o. every 6 hours as needed for moderate pain,   he was discharged on augmentin

## 2022-12-31 NOTE — Assessment & Plan Note (Signed)
Resolved currently.  His ulcer is clean based and there is no erythema

## 2023-01-11 ENCOUNTER — Other Ambulatory Visit: Payer: Self-pay | Admitting: Internal Medicine

## 2023-01-12 ENCOUNTER — Other Ambulatory Visit: Payer: Self-pay | Admitting: Internal Medicine

## 2023-01-12 MED ORDER — OXYCODONE HCL 5 MG PO TABS: 7.5000 mg | ORAL_TABLET | Freq: Every day | ORAL | 0 refills | Status: DC

## 2023-01-13 NOTE — Progress Notes (Signed)
  Care Coordination   Note   01/13/2023 Name: [name] MRN: [id number] DOB: [birth year]  [name] is a 57 y.o. year old male who sees Darrick Huntsman, Mar Daring, MD for primary care. I reached out to Olegario Shearer by phone today to offer care coordination services.  [name] was given information about Care Coordination services today including:   The Care Coordination services include support from the care team which includes your Nurse Coordinator, Clinical Social Worker, or Pharmacist.  The Care Coordination team is here to help remove barriers to the health concerns and goals most important to you. Care Coordination services are voluntary, and the patient may decline or stop services at any time by request to their care team member.   Care Coordination Consent Status: Patient agreed to services and verbal consent obtained.   Follow up plan:  Telephone appointment with care coordination team member scheduled for:  01/24/2023  Encounter Outcome:  Pt. Scheduled  Burman Nieves, CCMA Care Coordination Care Guide Direct Dial: [phone]

## 2023-01-24 ENCOUNTER — Ambulatory Visit: Payer: Self-pay

## 2023-01-24 NOTE — Patient Outreach (Signed)
  Care Coordination   Initial Visit Note   01/24/2023 Name: [name] MRN: [id number] DOB: Mar 07, 1966  [name] is a 57 y.o. year old male who sees Darrick Huntsman, Mar Daring, MD for primary care. I spoke with  Olegario Shearer by phone today.  What matters to the patients health and wellness today?  Patient reports hospitalization from 12/18/22 to 12/21/22 for cellulitis of right foot.  He reports having PCP follow up on 12/29/22. Patient states wound is healing well. Denies signs of infection. He reports changing dressing to wound every other day as advised. Patient reports blood sugars range in the 200's fasting. He states checking blood sugars 4-5 times per week.  Patient declined offer for social work referral or meals on wheels.     Goals Addressed             This Visit's Progress    Post hospital health condition follow up  and evaluation of community resource needs       Interventions Today    Flowsheet Row Most Recent Value  Chronic Disease   Chronic disease during today's visit Diabetes, Other  [cellulitis right foot]  General Interventions   General Interventions Discussed/Reviewed General Interventions Discussed, Doctor Visits, [employer] of current treatment plan for DM/ cellulitis of right foot and patients adherence to plan as established by provider. Assessed for signs of infection at wound site/ pain level. Assessed for BS readings. Offered referral for meals on wheels]  Doctor Visits Discussed/Reviewed Doctor Visits Reviewed  [Reviewed PCP office note from follow up visit on 12/29/22.  Reviewed upcoming provider visits.]  Education Interventions   Education Provided Provided Education  Provided Verbal Education On Other  [Discussed signs of infection and advised to report to PCP if noted. Discussed blood glucose management.  Assessed patient doing dressing changes to right foot wound.  Assessed for additional signs related to pneumonia.]  Pharmacy  Interventions   Pharmacy Dicussed/Reviewed Pharmacy Topics Discussed  [Medications reviewed and compliance discussed. Confirmed patient completed antibiotic treatment.]              SDOH assessments and interventions completed:  Yes  SDOH Interventions Today    Flowsheet Row Most Recent Value  SDOH Interventions   Food Insecurity Interventions Intervention Not Indicated  Housing Interventions Intervention Not Indicated  Transportation Interventions Intervention Not Indicated        Care Coordination Interventions:  Yes, provided   Follow up plan: Follow up call scheduled for 02/21/23    Encounter Outcome:  Pt. Visit Completed   George Ina RN,BSN,CCM [facility] Care Coordination [phone] direct line

## 2023-02-02 ENCOUNTER — Ambulatory Visit: Payer: BC Managed Care – PPO | Admitting: Urology

## 2023-02-21 ENCOUNTER — Ambulatory Visit: Payer: Self-pay

## 2023-02-21 NOTE — Patient Outreach (Signed)
  Care Coordination   02/21/2023 Name: [name] MRN: [id number] DOB: Jul 05, 1966   Care Coordination Outreach Attempts:  An unsuccessful telephone outreach was attempted for a scheduled appointment today.  Attempted call to listed mobile number. HIPAA compliant message left with call back phone number.  Attempted call to listed home number.  Message states number not in services.   Follow Up Plan:  Additional outreach attempts will be made to offer the patient care coordination information and services.   Encounter Outcome:  No Answer   Care Coordination Interventions:  No, not indicated    George Ina [facility] Care Coordination [phone] direct line

## 2023-02-27 ENCOUNTER — Other Ambulatory Visit: Payer: Self-pay | Admitting: Internal Medicine

## 2023-02-28 ENCOUNTER — Telehealth: Payer: Self-pay | Admitting: *Deleted

## 2023-02-28 MED ORDER — OXYCODONE HCL 5 MG PO TABS: 7.5000 mg | ORAL_TABLET | Freq: Every day | ORAL | 0 refills | Status: DC

## 2023-02-28 NOTE — Progress Notes (Signed)
  Care Coordination Note  02/28/2023 Name: [name] MRN: [id number] DOB: 1966-07-13  [name] is a 57 y.o. year old male who is a primary care patient of Darrick Huntsman, Mar Daring, MD and is actively engaged with the care management team. I reached out to Olegario Shearer by phone today to assist with re-scheduling a follow up visit with the RN Case Manager  Follow up plan: Unsuccessful telephone outreach attempt made. A HIPAA compliant phone message was left for the patient providing contact information and requesting a return call.   Burman Nieves, CCMA Care Coordination Care Guide Direct Dial: [phone]

## 2023-03-27 NOTE — Progress Notes (Signed)
  Care Coordination Note  03/27/2023 Name: [name] MRN: [id number] DOB: [birth year]  [name] is a 57 y.o. year old male who is a primary care patient of Darrick Huntsman, Mar Daring, MD and is actively engaged with the care management team. I reached out to Olegario Shearer by phone today to assist with re-scheduling a follow up visit with the RN Case Manager  Follow up plan: 2nd Unsuccessful telephone outreach attempt made. A HIPAA compliant phone message was left for the patient providing contact information and requesting a return call.   Burman Nieves, CCMA Care Coordination Care Guide Direct Dial: [phone]

## 2023-04-12 ENCOUNTER — Other Ambulatory Visit: Payer: Self-pay | Admitting: Internal Medicine

## 2023-04-13 MED ORDER — OXYCODONE HCL 5 MG PO TABS: 7.5000 mg | ORAL_TABLET | Freq: Every day | ORAL | 0 refills | Status: DC

## 2023-05-12 ENCOUNTER — Encounter: Payer: Self-pay | Admitting: Internal Medicine

## 2023-05-12 ENCOUNTER — Telehealth: Payer: BC Managed Care – PPO | Admitting: Internal Medicine

## 2023-05-12 DIAGNOSIS — E119 Type 2 diabetes mellitus without complications: Secondary | ICD-10-CM | POA: Diagnosis not present

## 2023-05-12 DIAGNOSIS — Z7984 Long term (current) use of oral hypoglycemic drugs: Secondary | ICD-10-CM | POA: Diagnosis not present

## 2023-05-12 DIAGNOSIS — L89894 Pressure ulcer of other site, stage 4: Secondary | ICD-10-CM

## 2023-05-12 DIAGNOSIS — G608 Other hereditary and idiopathic neuropathies: Secondary | ICD-10-CM

## 2023-05-12 MED ORDER — ATORVASTATIN CALCIUM 20 MG PO TABS: 20.0000 mg | ORAL_TABLET | Freq: Every day | ORAL | 3 refills | Status: DC

## 2023-05-12 MED ORDER — METFORMIN HCL ER 500 MG PO TB24: ORAL_TABLET | ORAL | 1 refills | Status: DC

## 2023-05-12 MED ORDER — OXYCODONE HCL 5 MG PO TABS: 7.5000 mg | ORAL_TABLET | Freq: Every day | ORAL | 0 refills | Status: DC

## 2023-05-12 MED ORDER — TELMISARTAN 20 MG PO TABS: 20.0000 mg | ORAL_TABLET | Freq: Every day | ORAL | 0 refills | Status: DC

## 2023-05-12 NOTE — Assessment & Plan Note (Signed)
With foot drop, managed with a corrected foot brace  suppleid by the [facility] .  He has a new job which allows him to sit , and uses a scooter at home in the house. He requires use of oxycodone at night to manage neuropathy pain .

## 2023-05-12 NOTE — Assessment & Plan Note (Signed)
Problematic to cure because he is unable to stay off the foot.  u

## 2023-05-12 NOTE — Progress Notes (Unsigned)
Virtual Visit via Caregility   Note   This format is felt to be most appropriate for this patient at this time.  All issues noted in this document were discussed and addressed.  No physical exam was performed (except for noted visual exam findings with Video Visits).   I connected with [name]  on 05/12/23 at  3:00 PM EDT by a video enabled telemedicine application e and verified that I am speaking with the correct person using two identifiers. Location patient: home Location provider: work or home office Persons participating in the virtual visit: patient, provider  I discussed the limitations, risks, security and privacy concerns of performing an evaluation and management service by telephone and the availability of in person appointments. I also discussed with the patient that there may be a patient responsible charge related to this service. The patient expressed understanding and agreed to proceed.  Interactive audio and video telecommunications were attempted between this provider and patient, however failed, due to patient having technical difficulties OR patient did not have access to video capability.  We continued and completed visit with audio only. 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    Not Using a scooter at work most days but staying off of it for up to 6 of the 8 hours .   Following a mediterranean diet after months of eating poorly and gaining weight due to work    ROS: See pertinent positives and negatives per HPI.  Past Medical History:  Diagnosis Date   Acute prostatitis without hematuria 05/20/2022   Allergy    Complication of anesthesia    pt states at [facility] he stopped breathing due to his sleep apnea-hard to wake up   COVID-19     Depression    Diabetic ulcer of both feet (HCC)    DM (diabetes mellitus), type 2 (HCC)    History of kidney stones    History of methicillin resistant staphylococcus aureus (MRSA) 09/2020   foot   Neuromuscular disorder (HCC)    neuropathy of back and bilateral feet   Obesity    Sleep apnea    does not use cpap    Past Surgical History:  Procedure Laterality Date   AMPUTATION Left    toe amputation   CYSTOSCOPY/URETEROSCOPY/HOLMIUM LASER/STENT PLACEMENT Right 07/29/2022   Procedure: CYSTOSCOPY/URETEROSCOPY/HOLMIUM LASER/STENT PLACEMENT;  Surgeon: Sondra Come, MD;  Location: [facility] ORS;  Service: Urology;  Laterality: Right;   FOOT SURGERY Right 09/2012   cyst removed   SPINE SURGERY  2004   nerve damage in spine   TOE AMPUTATION Right 06/30/2016   5 toes    Family History  Problem Relation Age of Onset   Heart disease Father    Stroke Father    Heart attack Father 56   Heart disease Maternal Grandmother    Stroke Maternal Grandmother    Heart disease Paternal Grandfather    Stroke Paternal Grandfather     SOCIAL HX: ***   Current Outpatient Medications:    aspirin EC 81 MG tablet, Take 81 mg by mouth daily. Swallow whole., Disp: , Rfl:    atorvastatin (LIPITOR) 20 MG tablet, TAKE 1 TABLET BY MOUTH EVERY DAY, Disp: 90 tablet, Rfl: 3   metFORMIN (GLUCOPHAGE-XR) 500  MG 24 hr tablet, TAKE 1 TABLET BY MOUTH EVERY DAY WITH BREAKFAST (Patient taking differently: Take 500 mg by mouth daily with breakfast.), Disp: 90 tablet, Rfl: 1   Multiple Vitamins-Minerals (MULTIVITAMIN WITH MINERALS) tablet, Take 1 tablet by mouth daily., Disp: , Rfl:    oxyCODONE (ROXICODONE) 5 MG immediate release tablet, Take 1.5 tablets (7.5 mg total) by mouth daily. As needed for severe pain, Disp: 45 tablet, Rfl: 0   telmisartan (MICARDIS) 20 MG tablet, Take 1 tablet (20 mg total) by mouth at bedtime., Disp: 90 tablet, Rfl: 0   tamsulosin (FLOMAX) 0.4 MG CAPS capsule, Take 0.4 mg by mouth daily.  (Patient not taking: Reported on 05/12/2023), Disp: , Rfl:   EXAM:  VITALS per patient if applicable:  GENERAL: alert, oriented, appears well and in no acute distress  HEENT: atraumatic, conjunttiva clear, no obvious abnormalities on inspection of external nose and ears  NECK: normal movements of the head and neck  LUNGS: on inspection no signs of respiratory distress, breathing rate appears normal, no obvious gross SOB, gasping or wheezing  CV: no obvious cyanosis  MS: moves all visible extremities without noticeable abnormality  PSYCH/NEURO: pleasant and cooperative, no obvious depression or anxiety, speech and thought processing grossly intact  ASSESSMENT AND PLAN: There are no diagnoses linked to this encounter.    I discussed the assessment and treatment plan with the patient. The patient was provided an opportunity to ask questions and all were answered. The patient agreed with the plan and demonstrated an understanding of the instructions.   The patient was advised to call back or seek an in-person evaluation if the symptoms worsen or if the condition fails to improve as anticipated.   I spent 30 minutes dedicated to the care of this patient on the date of this encounter to include pre-visit review of his medical history,  Face-to-face time with the patient , and post visit ordering of testing and therapeutics.    Sherlene Shams, MD

## 2023-05-14 ENCOUNTER — Encounter: Payer: Self-pay | Admitting: Internal Medicine

## 2023-05-14 NOTE — Assessment & Plan Note (Addendum)
He has been nonadherent to diet for several months due to transitioning from being married to being estranged from wife but taking metformin and telmisartan.  He has settled into a better routine and has reined in his eating habits.  Advised to return in November for follow up labs .  Continue atorvastatin, telmisartan and metformin    Lab Results  Component Value Date   HGBA1C 7.0 (H) 12/29/2022   Lab Results  Component Value Date   LABMICR See below: 08/03/2022   MICROALBUR 1.2 10/04/2022   MICROALBUR 3.2 (H) 02/22/2022

## 2023-07-10 ENCOUNTER — Inpatient Hospital Stay
Admission: EM | Admit: 2023-07-10 | Discharge: 2023-07-15 | DRG: 638 | Disposition: A | Discharge status: Home / Self Care | Payer: BC Managed Care – PPO | Attending: Internal Medicine | Admitting: Internal Medicine

## 2023-07-10 ENCOUNTER — Other Ambulatory Visit: Payer: Self-pay

## 2023-07-10 DIAGNOSIS — Z881 Allergy status to other antibiotic agents status: Secondary | ICD-10-CM

## 2023-07-10 DIAGNOSIS — E871 Hypo-osmolality and hyponatremia: Secondary | ICD-10-CM | POA: Diagnosis present

## 2023-07-10 DIAGNOSIS — M79671 Pain in right foot: Secondary | ICD-10-CM | POA: Diagnosis not present

## 2023-07-10 DIAGNOSIS — Z89421 Acquired absence of other right toe(s): Secondary | ICD-10-CM

## 2023-07-10 DIAGNOSIS — L97412 Non-pressure chronic ulcer of right heel and midfoot with fat layer exposed: Secondary | ICD-10-CM | POA: Diagnosis present

## 2023-07-10 DIAGNOSIS — L03115 Cellulitis of right lower limb: Secondary | ICD-10-CM | POA: Diagnosis present

## 2023-07-10 DIAGNOSIS — G4733 Obstructive sleep apnea (adult) (pediatric): Secondary | ICD-10-CM | POA: Diagnosis present

## 2023-07-10 DIAGNOSIS — M86171 Other acute osteomyelitis, right ankle and foot: Secondary | ICD-10-CM | POA: Diagnosis present

## 2023-07-10 DIAGNOSIS — Z87442 Personal history of urinary calculi: Secondary | ICD-10-CM

## 2023-07-10 DIAGNOSIS — R739 Hyperglycemia, unspecified: Secondary | ICD-10-CM

## 2023-07-10 DIAGNOSIS — Z79899 Other long term (current) drug therapy: Secondary | ICD-10-CM

## 2023-07-10 DIAGNOSIS — R6520 Severe sepsis without septic shock: Principal | ICD-10-CM

## 2023-07-10 DIAGNOSIS — Z89411 Acquired absence of right great toe: Secondary | ICD-10-CM

## 2023-07-10 DIAGNOSIS — Z89431 Acquired absence of right foot: Secondary | ICD-10-CM

## 2023-07-10 DIAGNOSIS — L97512 Non-pressure chronic ulcer of other part of right foot with fat layer exposed: Secondary | ICD-10-CM | POA: Diagnosis present

## 2023-07-10 DIAGNOSIS — I10 Essential (primary) hypertension: Secondary | ICD-10-CM | POA: Insufficient documentation

## 2023-07-10 DIAGNOSIS — Z6836 Body mass index (BMI) 36.0-36.9, adult: Secondary | ICD-10-CM

## 2023-07-10 DIAGNOSIS — E1169 Type 2 diabetes mellitus with other specified complication: Secondary | ICD-10-CM | POA: Diagnosis not present

## 2023-07-10 DIAGNOSIS — E66813 Obesity, class 3: Secondary | ICD-10-CM | POA: Insufficient documentation

## 2023-07-10 DIAGNOSIS — Z7982 Long term (current) use of aspirin: Secondary | ICD-10-CM

## 2023-07-10 DIAGNOSIS — Z89422 Acquired absence of other left toe(s): Secondary | ICD-10-CM

## 2023-07-10 DIAGNOSIS — Z8249 Family history of ischemic heart disease and other diseases of the circulatory system: Secondary | ICD-10-CM

## 2023-07-10 DIAGNOSIS — E08621 Diabetes mellitus due to underlying condition with foot ulcer: Secondary | ICD-10-CM | POA: Diagnosis present

## 2023-07-10 DIAGNOSIS — Z635 Disruption of family by separation and divorce: Secondary | ICD-10-CM

## 2023-07-10 DIAGNOSIS — M86671 Other chronic osteomyelitis, right ankle and foot: Secondary | ICD-10-CM | POA: Diagnosis present

## 2023-07-10 DIAGNOSIS — Z7984 Long term (current) use of oral hypoglycemic drugs: Secondary | ICD-10-CM

## 2023-07-10 DIAGNOSIS — F32A Depression, unspecified: Secondary | ICD-10-CM | POA: Diagnosis present

## 2023-07-10 DIAGNOSIS — Z8619 Personal history of other infectious and parasitic diseases: Secondary | ICD-10-CM

## 2023-07-10 DIAGNOSIS — E785 Hyperlipidemia, unspecified: Secondary | ICD-10-CM | POA: Diagnosis present

## 2023-07-10 DIAGNOSIS — Z8614 Personal history of Methicillin resistant Staphylococcus aureus infection: Secondary | ICD-10-CM

## 2023-07-10 DIAGNOSIS — Z5986 Financial insecurity: Secondary | ICD-10-CM

## 2023-07-10 DIAGNOSIS — Z8701 Personal history of pneumonia (recurrent): Secondary | ICD-10-CM

## 2023-07-10 DIAGNOSIS — E11621 Type 2 diabetes mellitus with foot ulcer: Secondary | ICD-10-CM | POA: Diagnosis present

## 2023-07-10 DIAGNOSIS — E11628 Type 2 diabetes mellitus with other skin complications: Secondary | ICD-10-CM | POA: Diagnosis present

## 2023-07-10 DIAGNOSIS — E1140 Type 2 diabetes mellitus with diabetic neuropathy, unspecified: Secondary | ICD-10-CM | POA: Diagnosis present

## 2023-07-10 DIAGNOSIS — Z8616 Personal history of COVID-19: Secondary | ICD-10-CM

## 2023-07-10 DIAGNOSIS — E1165 Type 2 diabetes mellitus with hyperglycemia: Secondary | ICD-10-CM

## 2023-07-10 DIAGNOSIS — E872 Acidosis, unspecified: Secondary | ICD-10-CM

## 2023-07-10 LAB — BASIC METABOLIC PANEL
Anion gap: 7 (ref 5–15)
BUN: 15 mg/dL (ref 6–20)
CO2: 22 mmol/L (ref 22–32)
Calcium: 8.2 mg/dL — ABNORMAL LOW (ref 8.9–10.3)
Chloride: 103 mmol/L (ref 98–111)
Creatinine, Ser: 0.91 mg/dL (ref 0.61–1.24)
GFR, Estimated: >60 mL/min (ref >60)
Glucose, Bld: 480 mg/dL — ABNORMAL HIGH (ref 70–99)
Potassium: 4.6 mmol/L (ref 3.5–5.1)
Sodium: 132 mmol/L — ABNORMAL LOW (ref 135–145)

## 2023-07-10 LAB — CBC WITH DIFFERENTIAL/PLATELET
Abs Immature Granulocytes: 0.04 10*3/uL (ref 0.00–0.07)
Basophils Absolute: 0.2 10*3/uL — ABNORMAL HIGH (ref 0.0–0.1)
Basophils Relative: 2 %
Eosinophils Absolute: 0.3 10*3/uL (ref 0.0–0.5)
Eosinophils Relative: 3 %
HCT: 45.3 % (ref 39.0–52.0)
Hemoglobin: 15.1 g/dL (ref 13.0–17.0)
Immature Granulocytes: 0 %
Lymphocytes Relative: 14 %
Lymphs Abs: 1.3 10*3/uL (ref 0.7–4.0)
MCH: 30.1 pg (ref 26.0–34.0)
MCHC: 33.3 g/dL (ref 30.0–36.0)
MCV: 90.4 fL (ref 80.0–100.0)
Monocytes Absolute: 1 10*3/uL (ref 0.1–1.0)
Monocytes Relative: 11 %
Neutro Abs: 6.4 10*3/uL (ref 1.7–7.7)
Neutrophils Relative %: 70 %
Platelets: 249 10*3/uL (ref 150–400)
RBC: 5.01 MIL/uL (ref 4.22–5.81)
RDW: 14.6 % (ref 11.5–15.5)
WBC: 9.3 10*3/uL (ref 4.0–10.5)
nRBC: 0 % (ref 0.0–0.2)

## 2023-07-10 LAB — LACTIC ACID, PLASMA: Lactic Acid, Venous: 2.1 mmol/L (ref 0.5–1.9)

## 2023-07-10 NOTE — ED Triage Notes (Signed)
Pt c/o right leg/foot swelling and redness. Pt has a wound to the bottom of his right foot that has been there since April that is the size of a baseball. Pt has previous amputations to all toes on right foot.

## 2023-07-11 ENCOUNTER — Encounter: Payer: Self-pay | Admitting: Internal Medicine

## 2023-07-11 ENCOUNTER — Emergency Department: Payer: BC Managed Care – PPO

## 2023-07-11 ENCOUNTER — Inpatient Hospital Stay: Payer: BC Managed Care – PPO

## 2023-07-11 DIAGNOSIS — M79671 Pain in right foot: Secondary | ICD-10-CM | POA: Diagnosis present

## 2023-07-11 DIAGNOSIS — Z8249 Family history of ischemic heart disease and other diseases of the circulatory system: Secondary | ICD-10-CM | POA: Diagnosis not present

## 2023-07-11 DIAGNOSIS — Z794 Long term (current) use of insulin: Secondary | ICD-10-CM | POA: Diagnosis not present

## 2023-07-11 DIAGNOSIS — M86671 Other chronic osteomyelitis, right ankle and foot: Secondary | ICD-10-CM | POA: Diagnosis present

## 2023-07-11 DIAGNOSIS — E872 Acidosis, unspecified: Secondary | ICD-10-CM

## 2023-07-11 DIAGNOSIS — E08621 Diabetes mellitus due to underlying condition with foot ulcer: Secondary | ICD-10-CM | POA: Diagnosis not present

## 2023-07-11 DIAGNOSIS — Z8619 Personal history of other infectious and parasitic diseases: Secondary | ICD-10-CM | POA: Diagnosis not present

## 2023-07-11 DIAGNOSIS — M86371 Chronic multifocal osteomyelitis, right ankle and foot: Secondary | ICD-10-CM | POA: Diagnosis not present

## 2023-07-11 DIAGNOSIS — E1165 Type 2 diabetes mellitus with hyperglycemia: Secondary | ICD-10-CM | POA: Diagnosis present

## 2023-07-11 DIAGNOSIS — E1169 Type 2 diabetes mellitus with other specified complication: Secondary | ICD-10-CM | POA: Diagnosis present

## 2023-07-11 DIAGNOSIS — L97512 Non-pressure chronic ulcer of other part of right foot with fat layer exposed: Secondary | ICD-10-CM

## 2023-07-11 DIAGNOSIS — G4733 Obstructive sleep apnea (adult) (pediatric): Secondary | ICD-10-CM | POA: Diagnosis present

## 2023-07-11 DIAGNOSIS — L97412 Non-pressure chronic ulcer of right heel and midfoot with fat layer exposed: Secondary | ICD-10-CM

## 2023-07-11 DIAGNOSIS — L97519 Non-pressure chronic ulcer of other part of right foot with unspecified severity: Secondary | ICD-10-CM | POA: Diagnosis not present

## 2023-07-11 DIAGNOSIS — E785 Hyperlipidemia, unspecified: Secondary | ICD-10-CM | POA: Diagnosis present

## 2023-07-11 DIAGNOSIS — E871 Hypo-osmolality and hyponatremia: Secondary | ICD-10-CM | POA: Diagnosis present

## 2023-07-11 DIAGNOSIS — M86171 Other acute osteomyelitis, right ankle and foot: Secondary | ICD-10-CM | POA: Diagnosis present

## 2023-07-11 DIAGNOSIS — Z5986 Financial insecurity: Secondary | ICD-10-CM | POA: Diagnosis not present

## 2023-07-11 DIAGNOSIS — I10 Essential (primary) hypertension: Secondary | ICD-10-CM | POA: Insufficient documentation

## 2023-07-11 DIAGNOSIS — Z881 Allergy status to other antibiotic agents status: Secondary | ICD-10-CM | POA: Diagnosis not present

## 2023-07-11 DIAGNOSIS — E11628 Type 2 diabetes mellitus with other skin complications: Secondary | ICD-10-CM | POA: Diagnosis present

## 2023-07-11 DIAGNOSIS — Z7984 Long term (current) use of oral hypoglycemic drugs: Secondary | ICD-10-CM | POA: Diagnosis not present

## 2023-07-11 DIAGNOSIS — Z6836 Body mass index (BMI) 36.0-36.9, adult: Secondary | ICD-10-CM | POA: Diagnosis not present

## 2023-07-11 DIAGNOSIS — L089 Local infection of the skin and subcutaneous tissue, unspecified: Secondary | ICD-10-CM | POA: Diagnosis not present

## 2023-07-11 DIAGNOSIS — M86071 Acute hematogenous osteomyelitis, right ankle and foot: Secondary | ICD-10-CM | POA: Diagnosis not present

## 2023-07-11 DIAGNOSIS — L03115 Cellulitis of right lower limb: Secondary | ICD-10-CM

## 2023-07-11 DIAGNOSIS — F32A Depression, unspecified: Secondary | ICD-10-CM | POA: Diagnosis present

## 2023-07-11 DIAGNOSIS — E11621 Type 2 diabetes mellitus with foot ulcer: Secondary | ICD-10-CM | POA: Diagnosis present

## 2023-07-11 DIAGNOSIS — Z8616 Personal history of COVID-19: Secondary | ICD-10-CM | POA: Diagnosis not present

## 2023-07-11 DIAGNOSIS — E1140 Type 2 diabetes mellitus with diabetic neuropathy, unspecified: Secondary | ICD-10-CM | POA: Diagnosis present

## 2023-07-11 DIAGNOSIS — E66813 Obesity, class 3: Secondary | ICD-10-CM | POA: Diagnosis present

## 2023-07-11 LAB — HEMOGLOBIN A1C
Hgb A1c MFr Bld: 8.6 % — ABNORMAL HIGH (ref 4.8–5.6)
Mean Plasma Glucose: 200.12 mg/dL

## 2023-07-11 LAB — BASIC METABOLIC PANEL
Anion gap: 8 (ref 5–15)
BUN: 10 mg/dL (ref 6–20)
CO2: 20 mmol/L — ABNORMAL LOW (ref 22–32)
Calcium: 7.8 mg/dL — ABNORMAL LOW (ref 8.9–10.3)
Chloride: 101 mmol/L (ref 98–111)
Creatinine, Ser: 0.74 mg/dL (ref 0.61–1.24)
GFR, Estimated: >60 mL/min (ref >60)
Glucose, Bld: 339 mg/dL — ABNORMAL HIGH (ref 70–99)
Potassium: 3.5 mmol/L (ref 3.5–5.1)
Sodium: 129 mmol/L — ABNORMAL LOW (ref 135–145)

## 2023-07-11 LAB — CBC
HCT: 39.7 % (ref 39.0–52.0)
Hemoglobin: 13.2 g/dL (ref 13.0–17.0)
MCH: 29.5 pg (ref 26.0–34.0)
MCHC: 33.2 g/dL (ref 30.0–36.0)
MCV: 88.6 fL (ref 80.0–100.0)
Platelets: 216 10*3/uL (ref 150–400)
RBC: 4.48 MIL/uL (ref 4.22–5.81)
RDW: 14.6 % (ref 11.5–15.5)
WBC: 8.5 10*3/uL (ref 4.0–10.5)
nRBC: 0 % (ref 0.0–0.2)

## 2023-07-11 LAB — GLUCOSE, CAPILLARY
Glucose-Capillary: 327 mg/dL — ABNORMAL HIGH (ref 70–99)
Glucose-Capillary: 319 mg/dL — ABNORMAL HIGH (ref 70–99)
Glucose-Capillary: 323 mg/dL — ABNORMAL HIGH (ref 70–99)
Glucose-Capillary: 320 mg/dL — ABNORMAL HIGH (ref 70–99)

## 2023-07-11 LAB — MRSA NEXT GEN BY PCR, NASAL: MRSA by PCR Next Gen: NOT DETECTED

## 2023-07-11 LAB — HIV ANTIBODY (ROUTINE TESTING W REFLEX): HIV Screen 4th Generation wRfx: NONREACTIVE

## 2023-07-11 LAB — LACTIC ACID, PLASMA
Lactic Acid, Venous: 2.2 mmol/L (ref 0.5–1.9)
Lactic Acid, Venous: 2.6 mmol/L (ref 0.5–1.9)

## 2023-07-11 MED ORDER — ACETAMINOPHEN 325 MG PO TABS: 650.0000 mg | ORAL_TABLET | Freq: Four times a day (QID) | ORAL | Status: DC | PRN

## 2023-07-11 MED ORDER — ENOXAPARIN SODIUM 60 MG/0.6ML IJ SOSY
50.0000 mg | PREFILLED_SYRINGE | INTRAMUSCULAR | Status: DC
  Administered 2023-07-11 – 2023-07-15 (×5): 50 mg via SUBCUTANEOUS
  Filled 2023-07-11 (×5): qty 0.6

## 2023-07-11 MED ORDER — OXYCODONE-ACETAMINOPHEN 5-325 MG PO TABS
1.0000 | ORAL_TABLET | Freq: Once | ORAL | Status: AC
  Administered 2023-07-11: 1 via ORAL
  Filled 2023-07-11: qty 1

## 2023-07-11 MED ORDER — SODIUM CHLORIDE 0.9 % IV SOLN
2.0000 g | Freq: Once | INTRAVENOUS | Status: AC
  Administered 2023-07-11: 2 g via INTRAVENOUS
  Filled 2023-07-11: qty 12.5

## 2023-07-11 MED ORDER — LACTATED RINGERS IV BOLUS (SEPSIS)
1000.0000 mL | Freq: Once | INTRAVENOUS | Status: AC
  Administered 2023-07-11: 1000 mL via INTRAVENOUS

## 2023-07-11 MED ORDER — OXYCODONE HCL 5 MG PO TABS
7.5000 mg | ORAL_TABLET | Freq: Four times a day (QID) | ORAL | Status: DC | PRN
  Administered 2023-07-11 – 2023-07-15 (×6): 7.5 mg via ORAL
  Filled 2023-07-11 (×6): qty 2

## 2023-07-11 MED ORDER — ALUM & MAG HYDROXIDE-SIMETH 200-200-20 MG/5ML PO SUSP
30.0000 mL | Freq: Four times a day (QID) | ORAL | Status: DC | PRN
  Administered 2023-07-11 – 2023-07-12 (×2): 30 mL via ORAL
  Filled 2023-07-11 (×2): qty 30

## 2023-07-11 MED ORDER — VANCOMYCIN HCL 1250 MG/250ML IV SOLN
1250.0000 mg | Freq: Two times a day (BID) | INTRAVENOUS | Status: DC
  Administered 2023-07-11 – 2023-07-13 (×5): 1250 mg via INTRAVENOUS
  Filled 2023-07-11 (×6): qty 250

## 2023-07-11 MED ORDER — TAMSULOSIN HCL 0.4 MG PO CAPS
0.4000 mg | ORAL_CAPSULE | Freq: Every day | ORAL | Status: DC
  Administered 2023-07-11 – 2023-07-15 (×5): 0.4 mg via ORAL
  Filled 2023-07-11 (×5): qty 1

## 2023-07-11 MED ORDER — SODIUM CHLORIDE 0.9 % IV SOLN
2.0000 g | Freq: Three times a day (TID) | INTRAVENOUS | Status: DC
Start: 2023-07-11 — End: 2023-07-13
  Administered 2023-07-11 – 2023-07-13 (×7): 2 g via INTRAVENOUS
  Filled 2023-07-11 (×9): qty 12.5

## 2023-07-11 MED ORDER — ATORVASTATIN CALCIUM 20 MG PO TABS
20.0000 mg | ORAL_TABLET | Freq: Every day | ORAL | Status: DC
  Administered 2023-07-11 – 2023-07-15 (×5): 20 mg via ORAL
  Filled 2023-07-11 (×5): qty 1

## 2023-07-11 MED ORDER — ASPIRIN 81 MG PO TBEC
81.0000 mg | DELAYED_RELEASE_TABLET | Freq: Every day | ORAL | Status: DC
  Administered 2023-07-11 – 2023-07-15 (×5): 81 mg via ORAL
  Filled 2023-07-11 (×5): qty 1

## 2023-07-11 MED ORDER — INSULIN ASPART 100 UNIT/ML IJ SOLN
0.0000 [IU] | Freq: Every day | INTRAMUSCULAR | Status: DC
  Administered 2023-07-11: 4 [IU] via SUBCUTANEOUS
  Administered 2023-07-13: 3 [IU] via SUBCUTANEOUS
  Administered 2023-07-14: 2 [IU] via SUBCUTANEOUS
  Filled 2023-07-11 (×4): qty 1

## 2023-07-11 MED ORDER — ONDANSETRON HCL 4 MG PO TABS: 4.0000 mg | ORAL_TABLET | Freq: Four times a day (QID) | ORAL | Status: DC | PRN

## 2023-07-11 MED ORDER — OXYCODONE HCL 5 MG PO TABS: 7.5000 mg | ORAL_TABLET | Freq: Every day | ORAL | Status: DC

## 2023-07-11 MED ORDER — VANCOMYCIN HCL IN DEXTROSE 1-5 GM/200ML-% IV SOLN
1000.0000 mg | Freq: Once | INTRAVENOUS | Status: AC
  Administered 2023-07-11: 1000 mg via INTRAVENOUS
  Filled 2023-07-11: qty 200

## 2023-07-11 MED ORDER — INFLUENZA VIRUS VACC SPLIT PF (FLUZONE) 0.5 ML IM SUSY: 0.5000 mL | PREFILLED_SYRINGE | INTRAMUSCULAR | Status: DC

## 2023-07-11 MED ORDER — ACETAMINOPHEN 650 MG RE SUPP: 650.0000 mg | Freq: Four times a day (QID) | RECTAL | Status: DC | PRN

## 2023-07-11 MED ORDER — GADOBUTROL 1 MMOL/ML IV SOLN
9.0000 mL | Freq: Once | INTRAVENOUS | Status: AC | PRN
  Administered 2023-07-11: 9 mL via INTRAVENOUS

## 2023-07-11 MED ORDER — VANCOMYCIN HCL IN DEXTROSE 1-5 GM/200ML-% IV SOLN
1000.0000 mg | Freq: Once | INTRAVENOUS | Status: DC
  Filled 2023-07-11: qty 200

## 2023-07-11 MED ORDER — ONDANSETRON HCL 4 MG/2ML IJ SOLN: 4.0000 mg | Freq: Four times a day (QID) | INTRAMUSCULAR | Status: DC | PRN

## 2023-07-11 MED ORDER — INSULIN ASPART 100 UNIT/ML IJ SOLN
0.0000 [IU] | Freq: Three times a day (TID) | INTRAMUSCULAR | Status: DC
  Administered 2023-07-11 (×3): 15 [IU] via SUBCUTANEOUS
  Administered 2023-07-12 (×2): 7 [IU] via SUBCUTANEOUS
  Administered 2023-07-12: 3 [IU] via SUBCUTANEOUS
  Administered 2023-07-13: 7 [IU] via SUBCUTANEOUS
  Administered 2023-07-13 – 2023-07-14 (×3): 11 [IU] via SUBCUTANEOUS
  Administered 2023-07-14: 15 [IU] via SUBCUTANEOUS
  Administered 2023-07-14 – 2023-07-15 (×2): 7 [IU] via SUBCUTANEOUS
  Administered 2023-07-15: 11 [IU] via SUBCUTANEOUS
  Filled 2023-07-11 (×14): qty 1

## 2023-07-11 MED ORDER — VANCOMYCIN HCL IN DEXTROSE 1-5 GM/200ML-% IV SOLN
1000.0000 mg | Freq: Once | INTRAVENOUS | Status: AC
  Administered 2023-07-11: 1000 mg via INTRAVENOUS

## 2023-07-11 NOTE — Progress Notes (Signed)
  PROGRESS NOTE    [name]  ZOX:[id number] DOB: 1966-05-30 DOA: 07/10/2023 PCP: Sherlene Shams, MD  128A/128A-AA  LOS: 0 days   Brief hospital course:   Assessment & Plan: [name] is a 57 y.o. male with medical history significant for diabetes mellitus, hypertension, chronic right plantar wound, prior right transmetatarsal amputation being admitted for cellulitis and likely osteomyelitis of the right foot from infection of his plantar ulcer.    Patient presented with pain swelling and redness of the right foot similar to presentation when he was hospitalized back in April 2024 for sepsis secondary to cellulitis.  Wound cultures grew Klebsiella and Enterococcus for which pt was treated with 2 weeks of Augmentin and Bactrim.  He has not followed with podiatry or wound care due to cost issues.   Cellulitis of right foot acute osteomyelitis of right midfoot Infected diabetic foot ulcer --started on vancomycin and cefepime on presentation. --MRI showed "Plantar ulceration of the medial aspect of the foot at the level of the medial cuneiform, extending to bone. Acute osteomyelitis of the medial navicular, the cuneiforms, and the residual second and third metatarsal bases." --Podiatry consulted, who rec BKA and vascular consult Plan: --cont vanc/cefepime --vascular consult --angiogram tomorrow  Sepsis ruled out --did not meet criteria  Uncontrolled type 2 diabetes mellitus with hyperglycemia, without long-term current use of insulin (HCC) --A1c 8.6.  Patient is on metformin at home --SSI for now --consult to diabetes coordinator  Obesity, Class III, BMI 40-49.9 (morbid obesity) (HCC) Complicating factor to overall prognosis and care  HTN (hypertension) --hold home telmisartan for now  OSA (obstructive sleep apnea) Not currently on CPAP  Hyponatremia Possibly due to elevated BG --monitor    DVT prophylaxis: Lovenox SQ Code Status: Full code  Family  Communication:  Level of care: Telemetry Medical Dispo:   The patient is from: home Anticipated d/c is to: home Anticipated d/c date is: > 3 days Patient currently is not medically ready to d/c due to: pending amputation   Subjective and Interval History:  No foot pain.  No other complaint.   Objective: Vitals:   07/11/23 0419 07/11/23 0837 07/11/23 1649 07/11/23 2003  BP: (!) 133/58 121/65 135/79 126/64  Pulse: 81 77 82 75  Resp: 18 16 19 17   Temp: 98.3 F (36.8 C) 97.6 F (36.4 C) 98 F (36.7 C) 98 F (36.7 C)  TempSrc: Oral  Oral Oral  SpO2: 97% 98% 100% 99%  Weight:      Height:        Intake/Output Summary (Last 24 hours) at 07/11/2023 2008 Last data filed at 07/11/2023 1121 Gross per 24 hour  Intake 1340 ml  Output --  Net 1340 ml   Filed Weights   07/10/23 2142  Weight: 100.7 kg    Examination:   Constitutional: NAD, AAOx3 HEENT: conjunctivae and lids normal, EOMI CV: No cyanosis.   RESP: normal respiratory effort, on RA Neuro: II - XII grossly intact.   Psych: Normal mood and affect.  Appropriate judgement and reason   Data Reviewed: I have personally reviewed labs and imaging studies  No charge.  Darlin Priestly, MD [facility] If 7PM-7AM, please contact night-coverage 07/11/2023, 8:08 PM

## 2023-07-11 NOTE — Consult Note (Addendum)
WOC Nurse Consult Note: Reason for Consult: Consult requested for right foot.  Performed remotely after review of progress notes and photo in the EMR Pt has a chronic full thickness wound which is red and moist and malodorous with mod amt drainage and dry yellow callous surrounding the wound edges.  X-ray indicates possible osteomyelitis and a podiatry consult is pending.  Topical treatment orders provided for bedside nurses to perform as follows until further plan of care is provided by the podiatry team: Foam dressing in the right foot until the podiatrist assessment and further consult. Please re-consult if further assistance is needed.  Thank-you,  Cammie Mcgee MSN, RN, CWOCN, [place], CNS [phone]

## 2023-07-11 NOTE — Progress Notes (Signed)
CODE SEPSIS - PHARMACY COMMUNICATION  **Broad Spectrum Antibiotics should be administered within 1 hour of Sepsis diagnosis**  Time Code Sepsis Called/Page Received: 0111  Antibiotics Ordered: Cefepime & Vancomycin  Time of 1st antibiotic administration: 0119  Otelia Sergeant, PharmD, MBA 07/11/2023 1:21 AM

## 2023-07-11 NOTE — Progress Notes (Signed)
PHARMACIST - PHYSICIAN COMMUNICATION  CONCERNING:  Enoxaparin (Lovenox) for DVT Prophylaxis    RECOMMENDATION: Patient was prescribed enoxaprin 40mg  q24 hours for VTE prophylaxis.   Filed Weights   07/10/23 2142  Weight: 100.7 kg (222 lb)    Body mass index is 36.94 kg/m.  Estimated Creatinine Clearance: 96.6 mL/min (by C-G formula based on SCr of 0.91 mg/dL).   Based on [facility] policy patient is candidate for enoxaparin 0.5mg /kg TBW SQ every 24 hours based on BMI being >30.  DESCRIPTION: Pharmacy has adjusted enoxaparin dose per [facility] policy.  Patient is now receiving enoxaparin 0.5 mg/kg every 24 hours   Otelia Sergeant, PharmD, [facility] 07/11/2023 3:08 AM

## 2023-07-11 NOTE — Assessment & Plan Note (Signed)
Blood sugar 480 Patient is on metformin at home Sliding scale insulin coverage Follow A1c Patient might need basal insulin for diabetic control Continue atorvastatin and aspirin

## 2023-07-11 NOTE — Assessment & Plan Note (Signed)
Not currently on CPAP

## 2023-07-11 NOTE — ED Provider Notes (Addendum)
[facility] Provider Note    Event Date/Time   First MD Initiated Contact with Patient 07/11/23 0000     (approximate)   History   Leg Pain   HPI [name] is a 57 y.o. male with a history of complicated wound infections on his right foot with multiple prior digit amputations.  He reports he has had a wound for at least 13 years.  He presents tonight for worsening pain, swelling, and redness throughout his right foot.  The patient reports that about every 9 months or less he seems to get an infection in the foot.  The wound on the bottom has not gotten any bigger recently, but he said that as of tonight it is more "juicy".  He has been doing his best to manage it at home, but months ago he had to stop going to wound care at [facility] due to financial constraints.  He said that 2 days ago he had an episode of chills and shaking (rigors) that happens sometimes when he is getting an infection, but then the chills went away and he has just been very tired over the last 2 days.  However he saw that it started getting red and warm to the touch and started swelling including the swelling of his lower leg which is atypical.  Then he noticed the wound looking different.  He admits that he did not complete his course of antibiotics previously, so he had a few Augmentin and Bactrim leftover.  He has taken several doses of each antibiotic over the last 24 hours.     Physical Exam   Triage Vital Signs: ED Triage Vitals  Encounter Vitals Group     BP 07/10/23 2141 (!) 163/90     Systolic BP Percentile --      Diastolic BP Percentile --      Pulse Rate 07/10/23 2141 (!) 108     Resp 07/10/23 2141 18     Temp 07/10/23 2141 98.7 F (37.1 C)     Temp Source 07/10/23 2141 Oral     SpO2 07/10/23 2141 95 %     Weight 07/10/23 2142 100.7 kg (222 lb)     Height 07/10/23 2142 1.651 m (5\' 5" )     Head Circumference --      Peak Flow --      Pain Score 07/10/23 2140 8      Pain Loc --      Pain Education --      Exclude from Growth Chart --     Most recent vital signs: Vitals:   07/10/23 2141 07/11/23 0049  BP: (!) 163/90 (!) 154/75  Pulse: (!) 108 92  Resp: 18 18  Temp: 98.7 F (37.1 C)   SpO2: 95% 98%    General: Awake, no obvious distress. CV:  Good peripheral perfusion.  Mild tachycardia, regular rhythm. Resp:  Normal effort. Speaking easily and comfortably, no accessory muscle usage nor intercostal retractions.   Abd:  No distention.  MSK/skin: 4 cm in diameter circular wound on the bottom of his right foot that is obviously chronic and has no purulence at this time, however it does appear somewhat wet.  The rest of the foot is showing evidence of edema and is extremely erythematous and warm to the touch.  His lower extremity appears to have some edema but no obvious cellulitis streaking up from the foot.  Patient has neuropathy so is not particular tender to palpation  but the patient says that his foot is painful.  All of his digits and part of the foot are surgically absent.  Unfortunately, my Haiku app was not working, so I could not take photos of the patient's wound(s).   ED Results / Procedures / Treatments   Labs (all labs ordered are listed, but only abnormal results are displayed) Labs Reviewed  CBC WITH DIFFERENTIAL/PLATELET - Abnormal; Notable for the following components:      Result Value   Basophils Absolute 0.2 (*)    All other components within normal limits  BASIC METABOLIC PANEL - Abnormal; Notable for the following components:   Sodium 132 (*)    Glucose, Bld 480 (*)    Calcium 8.2 (*)    All other components within normal limits  LACTIC ACID, PLASMA - Abnormal; Notable for the following components:   Lactic Acid, Venous 2.1 (*)    All other components within normal limits  LACTIC ACID, PLASMA - Abnormal; Notable for the following components:   Lactic Acid, Venous 2.6 (*)    All other components within normal limits   CULTURE, BLOOD (ROUTINE X 2)  CULTURE, BLOOD (ROUTINE X 2)     RADIOLOGY I viewed and interpreted the patient's foot x-rays.  Concerning for possible osteomyelitis but unclear.  Radiology report not yet available at time of admission.   PROCEDURES:  Critical Care performed: Yes, see critical care procedure note(s)  .1-3 Lead EKG Interpretation  Performed by: Loleta Rose, MD Authorized by: Loleta Rose, MD     Interpretation: abnormal     ECG rate:  105   ECG rate assessment: tachycardic     Rhythm: sinus tachycardia     Ectopy: none     Conduction: normal   .Critical Care  Performed by: Loleta Rose, MD Authorized by: Loleta Rose, MD   Critical care provider statement:    Critical care time (minutes):  45   Critical care time was exclusive of:  Separately billable procedures and treating other patients   Critical care was necessary to treat or prevent imminent or life-threatening deterioration of the following conditions:  Sepsis   Critical care was time spent personally by me on the following activities:  Development of treatment plan with patient or surrogate, evaluation of patient's response to treatment, examination of patient, obtaining history from patient or surrogate, ordering and performing treatments and interventions, ordering and review of laboratory studies, ordering and review of radiographic studies, pulse oximetry, re-evaluation of patient's condition and review of old charts     IMPRESSION / MDM / ASSESSMENT AND PLAN / ED COURSE  I reviewed the triage vital signs and the nursing notes.                              Differential diagnosis includes, but is not limited to, sepsis, cellulitis, osteomyelitis, abscess.  Patient's presentation is most consistent with acute presentation with potential threat to life or bodily function.  Labs/studies ordered: BMP, lactic acid, blood cultures x 2, CBC with differential, foot  x-rays  Interventions/Medications given:  Medications  lactated ringers bolus 1,000 mL (1,000 mLs Intravenous New Bag/Given 07/11/23 0029)    (Note:  hospital course my include additional interventions and/or labs/studies not listed above.)   Concern for possible sepsis based on the presentation and his heart rate of greater than 100.  He has no leukocytosis which is reassuring but his initial lactic acid is 2.1.  He technically does not meet criteria for sepsis based only on the tachycardia but I ordered 1 L LR IV bolus to see if this corrects both the tachycardia and the lactic acid.  I am investigating the possibility of osteomyelitis versus cellulitis before choosing antibiotics, but I am concerned about acute on chronic infection.  No evidence to suggest necrotizing fasciitis, no need for emergent MRI.     Clinical Course as of 07/11/23 4098  Tue Jul 11, 2023  1191 Lactic Acid, Venous(!!): 2.6 Lactic acid is elevated but the second lactic was sent prior to the patient receiving any IV fluids.  We will check a third lactic acid after fluid administration. [CF]  0108 Given the high probability of infection despite a normal white blood cell count, his tachycardia, and his elevated lactic acid, I am initiating code sepsis and empiric antibiotics of cefepime 2 g IV and vancomycin per pharmacy consult.  I am ordering 30 mL/kg of IV fluids based on ideal body weight. [CF]  4782 Consulted with Dr. Para March with the hospitalist service who will admit [CF]    Clinical Course User Index [CF] Loleta Rose, MD     FINAL CLINICAL IMPRESSION(S) / ED DIAGNOSES   Final diagnoses:  Severe sepsis (HCC)  Cellulitis of right foot  Hyperglycemia     Rx / DC Orders   ED Discharge Orders     None        Note:  This document was prepared using Dragon voice recognition software and may include unintentional dictation errors.   Loleta Rose, MD 07/11/23 9562    Loleta Rose,  MD 07/11/23 [phone]

## 2023-07-11 NOTE — Progress Notes (Signed)
PHARMACY -  BRIEF ANTIBIOTIC NOTE   Pharmacy has received consult(s) for Vancomycin from an ED provider.  The patient's profile has been reviewed for ht / wt / allergies / indication / available labs.    Pt with h/o reaction to Vancomycin causing itching.  One time order(s) placed for: Vancomycin 2000 mg per pt wt: 100.7 kg. Decreased infusion rate to reduce change of possible Redman Syndrome reaction.   Further antibiotics/pharmacy consults should be ordered by admitting physician if indicated.                       Thank you, Otelia Sergeant, PharmD, [facility] 07/11/2023 1:19 AM

## 2023-07-11 NOTE — Progress Notes (Signed)
Pharmacy Antibiotic Note  [name] is a 57 y.o. male admitted on 07/10/2023 with osteomyelitis.  Pharmacy has been consulted for Cefepime & Vancomycin dosing.  Pt with h/o of allergic rxn to Vancomycin causing itching.  Increasing infusion time of  doses to reduce chance of possible Redman Syndrome reaction/symptoms.    Plan: Cefepime 2 gm q8hr per indication & renal fxn.  Pt given Vancomycin 2000 mg once. Vancomycin 1250 mg IV Q 12 hrs. Goal AUC 400-550. Expected AUC: 505 SCr used: 0.91, Vd used: 0.72, BMI: 36.9  Pharmacy will continue to follow and will adjust abx dosing whenever warranted.  Temp (24hrs), Avg:98.4 F (36.9 C), Min:98.1 F (36.7 C), Max:98.7 F (37.1 C)   Recent Labs  Lab 07/10/23 2144 07/11/23 0018  WBC 9.3  --   CREATININE 0.91  --   LATICACIDVEN 2.1* 2.6*    Estimated Creatinine Clearance: 96.6 mL/min (by C-G formula based on SCr of 0.91 mg/dL).    Allergies  Allergen Reactions   Vancomycin Itching    Other reaction(s): Other (See Comments) Red mans    Antimicrobials this admission: 11/12 Cefepime >>  11/12 Vancomycin >>   Microbiology results: 11/12 BCx: Pending  Thank you for allowing pharmacy to be a part of this patient's care.  Otelia Sergeant, PharmD, MBA 07/11/2023 3:10 AM

## 2023-07-11 NOTE — Progress Notes (Signed)
Patient arrived to floor on stretcher, A/O x 4, ambulated to bed, Oriented to room and call bell. IV intact to rt hand. DM foot ulcer open to air, dressing applied wound is malodorous.

## 2023-07-11 NOTE — Assessment & Plan Note (Signed)
-

## 2023-07-11 NOTE — H&P (Signed)
History and Physical    Patient: [name] NWG:[id number] DOB: Oct 10, 1965 DOA: 07/10/2023 DOS: the patient was seen and examined on 07/11/2023 PCP: Sherlene Shams, MD  Patient coming from: Home  Chief Complaint:  Chief Complaint  Patient presents with   Leg Pain    HPI: [name] is a 57 y.o. male with medical history significant for non-insulin-dependent diabetes mellitus, hypertension, depression,  with chronic right plantar wound, prior right transmetatarsal amputation being admitted for possible sepsis secondary to cellulitis and likely osteomyelitis of the right foot from infection of his plantar ulcer.  Patient presented with pain swelling and redness of the right foot similar to presentation when he was hospitalized Back in April 2024 for sepsis secondary to cellulitis.  Wound cultures grew Klebsiella and Enterococcus for which she was treated with 2 weeks of Augmentin and Bactrim.  He has not followed with podiatry or wound care. ED course and data review: BP 163/90 and tachycardic to 108, afebrile Labs: CBC WNL but lactic acid 2.6.  BMP notable for glucose 480 Foot x-ray done, not yet interpreted  due to delays from radiology department Patient started on vancomycin and cefepime for cellulitis with suspicion of osteomyelitis and given a 2 L LR bolus, Percocet for pain Hospitalist consulted for admission.   Addendum: Foot xray "Multiple areas of lucency in the remaining midfoot and hindfoot, most apparent in the neck of the talus. Underlying osteomyelitis should be considered. Further evaluation with MRI of the foot with and without IV gadolinium could provide additional diagnostic Information".  Review of Systems: As mentioned in the history of present illness. All other systems reviewed and are negative.  Past Medical History:  Diagnosis Date   Acute prostatitis without hematuria 05/20/2022   Allergy    Atypical pneumonia 12/18/2022   Complication of  anesthesia    pt states at [facility] he stopped breathing due to his sleep apnea-hard to wake up   COVID-19    Depression    Diabetic ulcer of both feet (HCC)    DM (diabetes mellitus), type 2 (HCC)    History of kidney stones    History of methicillin resistant staphylococcus aureus (MRSA) 09/2020   foot   Neuromuscular disorder (HCC)    neuropathy of back and bilateral feet   Obesity    Sleep apnea    does not use cpap   Past Surgical History:  Procedure Laterality Date   AMPUTATION Left    toe amputation   CYSTOSCOPY/URETEROSCOPY/HOLMIUM LASER/STENT PLACEMENT Right 07/29/2022   Procedure: CYSTOSCOPY/URETEROSCOPY/HOLMIUM LASER/STENT PLACEMENT;  Surgeon: Sondra Come, MD;  Location: [facility] ORS;  Service: Urology;  Laterality: Right;   FOOT SURGERY Right 09/2012   cyst removed   SPINE SURGERY  2004   nerve damage in spine   TOE AMPUTATION Right 06/30/2016   5 toes   Social History:  reports that he has never smoked. He has never been exposed to tobacco smoke. He has never used smokeless tobacco. He reports that he does not drink alcohol and does not use drugs.  Allergies  Allergen Reactions   Vancomycin Itching    Other reaction(s): Other (See Comments) Red mans    Family History  Problem Relation Age of Onset   Heart disease Father    Stroke Father    Heart attack Father 60   Heart disease Maternal Grandmother    Stroke Maternal Grandmother    Heart disease Paternal Grandfather    Stroke Paternal Grandfather  Prior to Admission medications   Medication Sig Start Date End Date Taking? Authorizing Provider  aspirin EC 81 MG tablet Take 81 mg by mouth daily. Swallow whole.    [provider]  atorvastatin (LIPITOR) 20 MG tablet Take 1 tablet (20 mg total) by mouth daily. 05/12/23   Sherlene Shams, MD  metFORMIN (GLUCOPHAGE-XR) 500 MG 24 hr tablet TAKE 1 TABLET BY MOUTH EVERY DAY WITH BREAKFAST 05/12/23   Sherlene Shams, MD  Multiple Vitamins-Minerals  (MULTIVITAMIN WITH MINERALS) tablet Take 1 tablet by mouth daily.    [provider]  oxyCODONE (ROXICODONE) 5 MG immediate release tablet Take 1.5 tablets (7.5 mg total) by mouth daily. As needed for severe pain 05/12/23   Sherlene Shams, MD  tamsulosin (FLOMAX) 0.4 MG CAPS capsule Take 0.4 mg by mouth daily. Patient not taking: Reported on 05/12/2023    [provider]  telmisartan (MICARDIS) 20 MG tablet Take 1 tablet (20 mg total) by mouth at bedtime. 05/12/23   Sherlene Shams, MD    Physical Exam: Vitals:   07/10/23 2142 07/11/23 0049 07/11/23 0100 07/11/23 0131  BP:  (!) 154/75 139/76 133/75  Pulse:  92 87 86  Resp:  18 20 18   Temp:    98.1 F (36.7 C)  TempSrc:      SpO2:  98% 99% 98%  Weight: 100.7 kg     Height: 5\' 5"  (1.651 m)      Physical Exam Vitals and nursing note reviewed.  Constitutional:      General: He is not in acute distress. HENT:     Head: Normocephalic and atraumatic.  Cardiovascular:     Rate and Rhythm: Normal rate and regular rhythm.     Heart sounds: Normal heart sounds.  Pulmonary:     Effort: Pulmonary effort is normal.     Breath sounds: Normal breath sounds.  Abdominal:     Palpations: Abdomen is soft.     Tenderness: There is no abdominal tenderness.  Musculoskeletal:     Comments: Right transmetatarsal amputation with large ulcer on plantar aspect.  Edema right foot.   See pic below  Neurological:     Mental Status: Mental status is at baseline.        Labs on Admission: I have personally reviewed following labs and imaging studies  CBC: Recent Labs  Lab 07/10/23 2144  WBC 9.3  NEUTROABS 6.4  HGB 15.1  HCT 45.3  MCV 90.4  PLT 249   Basic Metabolic Panel: Recent Labs  Lab 07/10/23 2144  NA 132*  K 4.6  CL 103  CO2 22  GLUCOSE 480*  BUN 15  CREATININE 0.91  CALCIUM 8.2*   GFR: Estimated Creatinine Clearance: 96.6 mL/min (by C-G formula based on SCr of 0.91 mg/dL). Liver Function Tests: No  results for input(s): "AST", "ALT", "ALKPHOS", "BILITOT", "PROT", "ALBUMIN" in the last 168 hours. No results for input(s): "LIPASE", "AMYLASE" in the last 168 hours. No results for input(s): "AMMONIA" in the last 168 hours. Coagulation Profile: No results for input(s): "INR", "PROTIME" in the last 168 hours. Cardiac Enzymes: No results for input(s): "CKTOTAL", "CKMB", "CKMBINDEX", "TROPONINI" in the last 168 hours. BNP (last 3 results) No results for input(s): "PROBNP" in the last 8760 hours. HbA1C: No results for input(s): "HGBA1C" in the last 72 hours. CBG: No results for input(s): "GLUCAP" in the last 168 hours. Lipid Profile: No results for input(s): "CHOL", "HDL", "LDLCALC", "TRIG", "CHOLHDL", "LDLDIRECT" in the last 72  hours. Thyroid Function Tests: No results for input(s): "TSH", "T4TOTAL", "FREET4", "T3FREE", "THYROIDAB" in the last 72 hours. Anemia Panel: No results for input(s): "VITAMINB12", "FOLATE", "FERRITIN", "TIBC", "IRON", "RETICCTPCT" in the last 72 hours. Urine analysis:    Component Value Date/Time   COLORURINE YELLOW (A) 12/18/2022 1133   APPEARANCEUR HAZY (A) 12/18/2022 1133   APPEARANCEUR Cloudy (A) 08/03/2022 1513   LABSPEC 1.024 12/18/2022 1133   PHURINE 5.0 12/18/2022 1133   GLUCOSEU NEGATIVE 12/18/2022 1133   GLUCOSEU NEGATIVE 09/21/2022 0736   HGBUR SMALL (A) 12/18/2022 1133   BILIRUBINUR NEGATIVE 12/18/2022 1133   BILIRUBINUR Negative 08/03/2022 1513   KETONESUR 20 (A) 12/18/2022 1133   PROTEINUR >=300 (A) 12/18/2022 1133   UROBILINOGEN 0.2 09/21/2022 0736   NITRITE NEGATIVE 12/18/2022 1133   LEUKOCYTESUR NEGATIVE 12/18/2022 1133    Radiological Exams on Admission: DG Foot Complete Right  Result Date: 07/11/2023 CLINICAL DATA:  57 year old male with history of osteomyelitis status post amputation. Wound on the bottom of the right foot since April. EXAM: RIGHT FOOT COMPLETE - 3+ VIEW COMPARISON:  No priors. FINDINGS: Status post amputation of  the foot beyond the proximal aspects of the metatarsals. Some lucency is noted in several bones, most apparent in the neck of the talus. Lucency and poorly defined trabeculations within sesamoid bones also noted. Multifocal joint space narrowing, subchondral sclerosis, subchondral cyst formation and osteophyte formation throughout the remaining portions of the midfoot and hindfoot indicative of osteoarthritis. Soft tissues are indistinct surrounding the remaining portion of the foot and appear mildly swollen. IMPRESSION: 1. Multiple areas of lucency in the remaining midfoot and hindfoot, most apparent in the neck of the talus. Underlying osteomyelitis should be considered. Further evaluation with MRI of the foot with and without IV gadolinium could provide additional diagnostic information. Electronically Signed   By: Trudie Reed M.D.   On: 07/11/2023 05:28     Data Reviewed: Relevant notes from primary care and specialist visits, past discharge summaries as available in EHR, including Care Everywhere. Prior diagnostic testing as pertinent to current admission diagnoses Updated medications and problem lists for reconciliation ED course, including vitals, labs, imaging, treatment and response to treatment Triage notes, nursing and pharmacy notes and ED provider's notes Notable results as noted in HPI   Assessment and Plan: Cellulitis of right lower extremity Suspect acute on chronic osteomyelitis  Infected diabetic foot ulcer Lactic acidosis, possible sepsis Patient with lactic acid 2.6, heart rate in the 90s but otherwise not meeting sepsis criteria Received IV fluid boluses in the ED Continue vancomycin and cefepime Follow-up imaging, not ready at the time of admission Podiatry consulted Addendum: Foot x-ray suggesting osteomyelitis.  MRI with contrast ordered  Uncontrolled type 2 diabetes mellitus with hyperglycemia, without long-term current use of insulin (HCC) Blood sugar  480 Patient is on metformin at home Sliding scale insulin coverage Follow A1c Patient might need basal insulin for diabetic control Continue atorvastatin and aspirin  Obesity, Class III, BMI 40-49.9 (morbid obesity) (HCC) Complicating factor to overall prognosis and care  HTN (hypertension) Continue home meds  OSA (obstructive sleep apnea) Not currently on CPAP    DVT prophylaxis: Lovenox  Consults: podiatry, Dr Lilian Kapur  Advance Care Planning:   Code Status: Prior   Family Communication: none  Disposition Plan: Back to previous home environment  Severity of Illness: The appropriate patient status for this patient is INPATIENT. Inpatient status is judged to be reasonable and necessary in order to provide the required intensity of service to  ensure the patient's safety. The patient's presenting symptoms, physical exam findings, and initial radiographic and laboratory data in the context of their chronic comorbidities is felt to place them at high risk for further clinical deterioration. Furthermore, it is not anticipated that the patient will be medically stable for discharge from the hospital within 2 midnights of admission.   * I certify that at the point of admission it is my clinical judgment that the patient will require inpatient hospital care spanning beyond 2 midnights from the point of admission due to high intensity of service, high risk for further deterioration and high frequency of surveillance required.*  Author: Andris Baumann, MD 07/11/2023 2:38 AM  For on call review [email or web address].

## 2023-07-11 NOTE — Assessment & Plan Note (Addendum)
Suspect acute on chronic osteomyelitis  Infected diabetic foot ulcer Lactic acidosis, possible sepsis Patient with lactic acid 2.6, heart rate in the 90s but otherwise not meeting sepsis criteria Received IV fluid boluses in the ED Continue vancomycin and cefepime Follow-up imaging, not ready at the time of admission Podiatry consulted Addendum: Foot x-ray suggesting osteomyelitis.  MRI with contrast ordered

## 2023-07-11 NOTE — Consult Note (Signed)
ORTHOPAEDIC CONSULTATION  REQUESTING PHYSICIAN: Darlin Priestly, MD  Chief Complaint: Right foot infection  HPI: [name] is a 57 y.o. male who complains of worsening redness swelling and drainage from his right foot.  Has a longstanding history of an ulcer to this right foot.  Has undergone previous transmetatarsal amputation at outside facility.  He has been applying dressings at home.  He states he has been using collagen and dressing changes.  He noticed some chills and worsening redness recently.  He has had similar issues in the past.  Is not been seen for follow-up in podiatry or vascular surgery.  Past Medical History:  Diagnosis Date   Acute prostatitis without hematuria 05/20/2022   Allergy    Atypical pneumonia 12/18/2022   Complication of anesthesia    pt states at [facility] he stopped breathing due to his sleep apnea-hard to wake up   COVID-19    Depression    Diabetic ulcer of both feet (HCC)    DM (diabetes mellitus), type 2 (HCC)    History of kidney stones    History of methicillin resistant staphylococcus aureus (MRSA) 09/2020   foot   Neuromuscular disorder (HCC)    neuropathy of back and bilateral feet   Obesity    Sleep apnea    does not use cpap   Past Surgical History:  Procedure Laterality Date   AMPUTATION Left    toe amputation   CYSTOSCOPY/URETEROSCOPY/HOLMIUM LASER/STENT PLACEMENT Right 07/29/2022   Procedure: CYSTOSCOPY/URETEROSCOPY/HOLMIUM LASER/STENT PLACEMENT;  Surgeon: Sondra Come, MD;  Location: [facility] OR[facility];  Service: Urology;  Laterality: Right;   FOOT SURGERY Right 09/2012   cyst removed   SPINE SURGERY  2004   nerve damage in spine   TOE AMPUTATION Right 06/30/2016   5 toes   Social History   Socioeconomic History   Marital status: Legally Separated    Spouse name: Not on file   Number of children: Not on file   Years of education: Not on file   Highest education level: Bachelor's degree (e.g., BA, AB, BS)  Occupational History    Not on file  Tobacco Use   Smoking status: Never    Passive exposure: Never   Smokeless tobacco: Never  Vaping Use   Vaping status: Never Used  Substance and Sexual Activity   Alcohol use: Never   Drug use: Never   Sexual activity: Not Currently  Other Topics Concern   Not on file  Social History Narrative   Not on file   Social Determinants of Health   Financial Resource Strain: High Risk (12/28/2022)   Overall Financial Resource Strain (CARDIA)    Difficulty of Paying Living Expenses: Very hard  Food Insecurity: No Food Insecurity (07/11/2023)   Hunger Vital Sign    Worried About Running Out of Food in the Last Year: Never true    Ran Out of Food in the Last Year: Never true  Transportation Needs: No Transportation Needs (07/11/2023)   PRAPARE - Administrator, Civil Service (Medical): No    Lack of Transportation (Non-Medical): No  Physical Activity: Unknown (12/28/2022)   Exercise Vital Sign    Days of Exercise per Week: 0 days    Minutes of Exercise per Session: Not on file  Stress: Stress Concern Present (12/28/2022)   [employer] of Occupational Health - Occupational Stress Questionnaire    Feeling of Stress : Rather much  Social Connections: Socially Isolated (12/28/2022)   Social Connection and Isolation Panel [  NHANES]    Frequency of Communication with Friends and Family: Never    Frequency of Social Gatherings with Friends and Family: Never    Attends Religious Services: Never    Database administrator or Organizations: No    Attends Engineer, structural: Not on file    Marital Status: Separated   Family History  Problem Relation Age of Onset   Heart disease Father    Stroke Father    Heart attack Father 13   Heart disease Maternal Grandmother    Stroke Maternal Grandmother    Heart disease Paternal Grandfather    Stroke Paternal Grandfather    Allergies  Allergen Reactions   Vancomycin Itching    Other reaction(s): Other (See  Comments) Red mans   Prior to Admission medications   Medication Sig Start Date End Date Taking? Authorizing Provider  aspirin EC 81 MG tablet Take 81 mg by mouth daily. Swallow whole.   Yes [provider]  atorvastatin (LIPITOR) 20 MG tablet Take 1 tablet (20 mg total) by mouth daily. 05/12/23  Yes Sherlene Shams, MD  metFORMIN (GLUCOPHAGE-XR) 500 MG 24 hr tablet TAKE 1 TABLET BY MOUTH EVERY DAY WITH BREAKFAST 05/12/23  Yes Sherlene Shams, MD  Multiple Vitamins-Minerals (MULTIVITAMIN WITH MINERALS) tablet Take 1 tablet by mouth daily.   Yes [provider]  oxyCODONE (ROXICODONE) 5 MG immediate release tablet Take 1.5 tablets (7.5 mg total) by mouth daily. As needed for severe pain 05/12/23  Yes Sherlene Shams, MD  SODIUM FLUORIDE 5000 SENSITIVE 1.1-5 % GEL Brush as directed by your provider. 06/20/23  Yes [provider]  tamsulosin (FLOMAX) 0.4 MG CAPS capsule Take 0.4 mg by mouth daily.   Yes [provider]  telmisartan (MICARDIS) 20 MG tablet Take 1 tablet (20 mg total) by mouth at bedtime. Patient not taking: Reported on 07/11/2023 05/12/23   Sherlene Shams, MD   DG Foot Complete Right  Result Date: 07/11/2023 CLINICAL DATA:  57 year old male with history of osteomyelitis status post amputation. Wound on the bottom of the right foot since April. EXAM: RIGHT FOOT COMPLETE - 3+ VIEW COMPARISON:  No priors. FINDINGS: Status post amputation of the foot beyond the proximal aspects of the metatarsals. Some lucency is noted in several bones, most apparent in the neck of the talus. Lucency and poorly defined trabeculations within sesamoid bones also noted. Multifocal joint space narrowing, subchondral sclerosis, subchondral cyst formation and osteophyte formation throughout the remaining portions of the midfoot and hindfoot indicative of osteoarthritis. Soft tissues are indistinct surrounding the remaining portion of the foot and appear mildly swollen.  IMPRESSION: 1. Multiple areas of lucency in the remaining midfoot and hindfoot, most apparent in the neck of the talus. Underlying osteomyelitis should be considered. Further evaluation with MRI of the foot with and without IV gadolinium could provide additional diagnostic information. Electronically Signed   By: Trudie Reed M.D.   On: 07/11/2023 05:28    Positive ROS: All other systems have been reviewed and were otherwise negative with the exception of those mentioned in the HPI and as above.  12 point ROS was performed.  Physical Exam: General: Alert and oriented.  No apparent distress.  Vascular:  Left foot:Dorsalis Pedis:  present Posterior Tibial:  present  Right foot: Dorsalis Pedis:  present Posterior Tibial:  present  Neuro:absent protective sensation  Derm: Left foot without ulceration.  He status post left fourth toe amputation.  Right foot with large plantar  ulceration 5 cm in diameter.  Central aspect of the ulcer probes deep all the way to the bone.  I suspect this is in the navicular region based on his anatomy.  Rounding erythema is noted to the ankle region.  See clinical picture.   Ortho/MS: Status post transmetatarsal amputation right foot.  Edema to the ankle and foot is noted.  I personally reviewed the x-rays.  He has undergone previous proximal transmetatarsal amputation.  I did review the MRI.  Awaiting radiology read.  There is noted edema to the medial portion of the first met cuneiform and second met cuneiform region.  I suspect this is consistent with osteomyelitis.  Assessment: Osteomyelitis right midfoot This post transmetatarsal amputation right foot Diabetic foot infection  Plan: Given the location of the deep wound and osteomyelitis likely present I do not believe there is any limb salvage attainable at this time.  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  I  believe he would be much more functional with a below the knee amputation.  He does have palpable pulses.  I discussed this with the patient in great detail and I would recommend consultation with vascular surgery for lower extremity amputation in the near future.  New dressing applied today.  Please reconsult podiatry as needed.    Irean Hong, DPM Cell [phone]   07/11/2023 12:59 PM \

## 2023-07-11 NOTE — Assessment & Plan Note (Signed)
Complicating factor to overall prognosis and care 

## 2023-07-11 NOTE — Progress Notes (Signed)
Attempted to introduce patient to role of nurse navigator. Provider at bedside. Will follow up

## 2023-07-11 NOTE — Plan of Care (Signed)
  Problem: Education: Goal: Ability to describe self-care measures that may prevent or decrease complications (Diabetes Survival Skills Education) will improve Outcome: Progressing   Problem: Coping: Goal: Ability to adjust to condition or change in health will improve Outcome: Progressing   Problem: Fluid Volume: Goal: Ability to maintain a balanced intake and output will improve Outcome: Progressing   Problem: Nutritional: Goal: Maintenance of adequate nutrition will improve Outcome: Progressing   Problem: Tissue Perfusion: Goal: Adequacy of tissue perfusion will improve Outcome: Progressing   Problem: Education: Goal: Knowledge of General Education information will improve Description: Including pain rating scale, medication(s)/side effects and non-pharmacologic comfort measures Outcome: Progressing   Problem: Activity: Goal: Risk for activity intolerance will decrease Outcome: Progressing   Problem: Nutrition: Goal: Adequate nutrition will be maintained Outcome: Progressing   Problem: Coping: Goal: Level of anxiety will decrease Outcome: Progressing   Problem: Pain Management: Goal: General experience of comfort will improve Outcome: Progressing

## 2023-07-11 NOTE — Consult Note (Signed)
Hospital Consult    Reason for Consult:  Right Lower extremity Osteomyelitis Requesting Physician:  Dr Gwyneth Revels MD MRN #:  [id number]  History of Present Illness: This is a 57 y.o. male who complains of worsening redness swelling and drainage from his right foot.  Has a longstanding history of an ulcer to this right foot.  Has undergone previous transmetatarsal amputation at outside facility.  He has been applying dressings at home.  He states he has been using collagen and dressing changes.  He noticed some chills and worsening redness recently.  He has had similar issues in the past.  On exam today patient is resting comfortably in bed.  We had a long discussion about his prior vascular history which includes being seen at [facility] and [facility].  He is unable to tell me the last time he had an angiogram or if he is ever actually had one.  He endorses that he does not really remember.  He does endorse that he has been working with trying to heal his foot appropriately for the last 13 years.  Patient's right lower is bandaged at this time and I did not take it down but looked at the [employer].  His ankle to his knee appears cellulitic and is very swollen.  Patient endorses that he would rather not lose his foot at this time if it can be saved.  He has seen a Dr. Quitman Livings and there at [facility] and he has always been able to heal his foot.  He states "I just do not want to jump into an amputation ".  Vascular surgery was consulted to evaluate.  Past Medical History:  Diagnosis Date   Acute prostatitis without hematuria 05/20/2022   Allergy    Atypical pneumonia 12/18/2022   Complication of anesthesia    pt states at [facility] he stopped breathing due to his sleep apnea-hard to wake up   COVID-19    Depression    Diabetic ulcer of both feet (HCC)    DM (diabetes mellitus), type 2 (HCC)    History of kidney stones    History of methicillin resistant staphylococcus aureus (MRSA) 09/2020   foot    Neuromuscular disorder (HCC)    neuropathy of back and bilateral feet   Obesity    Sleep apnea    does not use cpap    Past Surgical History:  Procedure Laterality Date   AMPUTATION Left    toe amputation   CYSTOSCOPY/URETEROSCOPY/HOLMIUM LASER/STENT PLACEMENT Right 07/29/2022   Procedure: CYSTOSCOPY/URETEROSCOPY/HOLMIUM LASER/STENT PLACEMENT;  Surgeon: Sondra Come, MD;  Location: [facility] ORS;  Service: Urology;  Laterality: Right;   FOOT SURGERY Right 09/2012   cyst removed   SPINE SURGERY  2004   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 Authorizing Provider  aspirin EC 81 MG tablet Take 81 mg by mouth daily. Swallow whole.   Yes [provider]  atorvastatin (LIPITOR) 20 MG tablet Take 1 tablet (20 mg total) by mouth daily. 05/12/23  Yes Sherlene Shams, MD  metFORMIN (GLUCOPHAGE-XR) 500 MG 24 hr tablet TAKE 1 TABLET BY MOUTH EVERY DAY WITH BREAKFAST 05/12/23  Yes Sherlene Shams, MD  Multiple Vitamins-Minerals (MULTIVITAMIN WITH MINERALS) tablet Take 1 tablet by mouth  daily.   Yes [provider]  oxyCODONE (ROXICODONE) 5 MG immediate release tablet Take 1.5 tablets (7.5 mg total) by mouth daily. As needed for severe pain 05/12/23  Yes Sherlene Shams, MD  SODIUM FLUORIDE 5000 SENSITIVE 1.1-5 % GEL Brush as directed by your provider. 06/20/23  Yes [provider]  tamsulosin (FLOMAX) 0.4 MG CAPS capsule Take 0.4 mg by mouth daily.   Yes [provider]  telmisartan (MICARDIS) 20 MG tablet Take 1 tablet (20 mg total) by mouth at bedtime. Patient not taking: Reported on 07/11/2023 05/12/23   Sherlene Shams, MD    Social History   Socioeconomic History   Marital status: Legally Separated    Spouse name: Not on file   Number of  children: Not on file   Years of education: Not on file   Highest education level: Bachelor's degree (e.g., BA, AB, BS)  Occupational History   Not on file  Tobacco Use   Smoking status: Never    Passive exposure: Never   Smokeless tobacco: Never  Vaping Use   Vaping status: Never Used  Substance and Sexual Activity   Alcohol use: Never   Drug use: Never   Sexual activity: Not Currently  Other Topics Concern   Not on file  Social History Narrative   Not on file   Social Determinants of Health   Financial Resource Strain: High Risk (12/28/2022)   Overall Financial Resource Strain (CARDIA)    Difficulty of Paying Living Expenses: Very hard  Food Insecurity: No Food Insecurity (07/11/2023)   Hunger Vital Sign    Worried About Running Out of Food in the Last Year: Never true    Ran Out of Food in the Last Year: Never true  Transportation Needs: No Transportation Needs (07/11/2023)   PRAPARE - Administrator, Civil Service (Medical): No    Lack of Transportation (Non-Medical): No  Physical Activity: Unknown (12/28/2022)   Exercise Vital Sign    Days of Exercise per Week: 0 days    Minutes of Exercise per Session: Not on file  Stress: Stress Concern Present (12/28/2022)   [employer] of Occupational Health - Occupational Stress Questionnaire    Feeling of Stress : Rather much  Social Connections: Socially Isolated (12/28/2022)   Social Connection and Isolation Panel [NHANES]    Frequency of Communication with Friends and Family: Never    Frequency of Social Gatherings with Friends and Family: Never    Attends Religious Services: Never    Database administrator or Organizations: No    Attends Engineer, structural: Not on file    Marital Status: Separated  Intimate Partner Violence: Not At Risk (07/11/2023)   Humiliation, Afraid, Rape, and Kick questionnaire    Fear of Current or Ex-Partner: No    Emotionally Abused: No    Physically Abused: No     Sexually Abused: No     Family History  Problem Relation Age of Onset   Heart disease Father    Stroke Father    Heart attack Father 45   Heart disease Maternal Grandmother    Stroke Maternal Grandmother    Heart disease Paternal Grandfather    Stroke Paternal Grandfather     ROS: Otherwise negative unless mentioned in HPI  Physical Examination  Vitals:   07/11/23 0419 07/11/23 0837  BP: (!) 133/58 121/65  Pulse: 81 77  Resp: 18 16  Temp: 98.3 F (36.8 C) 97.6 F (36.4 C)  SpO2: 97% 98%   Body mass index is 36.94 kg/m.  General:  WDWN in NAD Gait: Not observed HENT: WNL, normocephalic Pulmonary: normal non-labored breathing, without Rales, rhonchi,  wheezing Cardiac: regular, without  Murmurs, rubs or gallops; without carotid bruits Abdomen: Positive bowel sounds,  soft, NT/ND, no masses Skin: without rashes Vascular Exam/Pulses: Right lower extremity with +3 edema from upper calf to foot. Unable to palpate pulses due to swelling. Warm to touch and with redness appearing cellulitic. Left lower extremity normal in size, warm to touch with weak but palpable pulses.  Extremities: with ischemic changes, without Gangrene , with cellulitis; with open wounds to dorsal right foot.  Musculoskeletal: no muscle wasting or atrophy. Open wound/ulcer to right foot.   Neurologic: A&O X 3;  No focal weakness or paresthesias are detected; speech is fluent/normal Psychiatric:  The pt has Normal affect. Lymph:  Unremarkable  CBC    Component Value Date/Time   WBC 8.5 07/11/2023 0704   RBC 4.48 07/11/2023 0704   HGB 13.2 07/11/2023 0704   HCT 39.7 07/11/2023 0704   PLT 216 07/11/2023 0704   MCV 88.6 07/11/2023 0704   MCH 29.5 07/11/2023 0704   MCHC 33.2 07/11/2023 0704   RDW 14.6 07/11/2023 0704   LYMPHSABS 1.3 07/10/2023 2144   MONOABS 1.0 07/10/2023 2144   EOSABS 0.3 07/10/2023 2144   BASOSABS 0.2 (H) 07/10/2023 2144    BMET    Component Value Date/Time   NA 129 (L)  07/11/2023 0704   NA 138 04/08/2017 0000   K 3.5 07/11/2023 0704   CL 101 07/11/2023 0704   CO2 20 (L) 07/11/2023 0704   GLUCOSE 339 (H) 07/11/2023 0704   BUN 10 07/11/2023 0704   BUN 13 04/08/2017 0000   CREATININE 0.74 07/11/2023 0704   CREATININE 0.89 02/15/2019 1442   CALCIUM 7.8 (L) 07/11/2023 0704   GFRNONAA >60 07/11/2023 0704   GFRAA >60 09/21/2015 1941    COAGS: Lab Results  Component Value Date   INR 1.3 (H) 12/19/2022   INR 1.3 (H) 12/18/2022   INR 1.3 (A) 04/08/2017     Non-Invasive Vascular Imaging:   MRI of right foot ordered. No reading at this time.   Statin:  Yes.   Beta Blocker:  No. Aspirin:  Yes.   ACEI:  No. ARB:  Yes.   CCB use:  No Other antiplatelets/anticoagulants:  No.    ASSESSMENT/PLAN: This is a 57 y.o. male who presents to [facility] with open wound to right foot. Patient seen by podiatry. MRI ordered. Previous Transmetatarsal amputation with wound to right forefoot open and draining. Podiatry requests Vascular Surgery evaluate for Right lower extremity BKA.   Patient today endorses he would not like to have any amputation at this time.  Therefore we had a long detailed discussion about his prior vascular history and what we can do today to evaluate.  I agreed that the patient would need at the minimum a right lower extremity angiogram with possible intervention to determine blood flow to his foot for either healing purposes or determining what type of amputation he would need such as BKA versus AKA.  Therefore vascular surgery will plan on taking the patient to the vascular lab tomorrow on 07/12/2023 for a right lower extremity angiogram with possible intervention.  I discussed in detail with the patient the procedure, benefits, risk, and complications.  He verbalizes his understanding today and wishes to proceed with the angiogram and possible intervention.  I answered all  the patient's questions at this time.  I will make the patient n.p.o. after  midnight for procedure tomorrow.   -I discussed the case in detail with Dr Festus Barren MD and he agrees with the plan.    Marcie [facility] 07/11/2023 1:31 PM

## 2023-07-11 NOTE — Progress Notes (Signed)
Elink monitoring for the code sepsis protocol.  

## 2023-07-11 NOTE — H&P (View-Only) (Signed)
Hospital Consult    Reason for Consult:  Right Lower extremity Osteomyelitis Requesting Physician:  Dr Gwyneth Revels MD MRN #:  [id number]  History of Present Illness: This is a 57 y.o. male who complains of worsening redness swelling and drainage from his right foot.  Has a longstanding history of an ulcer to this right foot.  Has undergone previous transmetatarsal amputation at outside facility.  He has been applying dressings at home.  He states he has been using collagen and dressing changes.  He noticed some chills and worsening redness recently.  He has had similar issues in the past.  On exam today patient is resting comfortably in bed.  We had a long discussion about his prior vascular history which includes being seen at [facility] and [facility].  He is unable to tell me the last time he had an angiogram or if he is ever actually had one.  He endorses that he does not really remember.  He does endorse that he has been working with trying to heal his foot appropriately for the last 13 years.  Patient's right lower is bandaged at this time and I did not take it down but looked at the [employer].  His ankle to his knee appears cellulitic and is very swollen.  Patient endorses that he would rather not lose his foot at this time if it can be saved.  He has seen a Dr. Quitman Livings and there at [facility] and he has always been able to heal his foot.  He states "I just do not want to jump into an amputation ".  Vascular surgery was consulted to evaluate.  Past Medical History:  Diagnosis Date   Acute prostatitis without hematuria 05/20/2022   Allergy    Atypical pneumonia 12/18/2022   Complication of anesthesia    pt states at [facility] he stopped breathing due to his sleep apnea-hard to wake up   COVID-19    Depression    Diabetic ulcer of both feet (HCC)    DM (diabetes mellitus), type 2 (HCC)    History of kidney stones    History of methicillin resistant staphylococcus aureus (MRSA) 09/2020   foot    Neuromuscular disorder (HCC)    neuropathy of back and bilateral feet   Obesity    Sleep apnea    does not use cpap    Past Surgical History:  Procedure Laterality Date   AMPUTATION Left    toe amputation   CYSTOSCOPY/URETEROSCOPY/HOLMIUM LASER/STENT PLACEMENT Right 07/29/2022   Procedure: CYSTOSCOPY/URETEROSCOPY/HOLMIUM LASER/STENT PLACEMENT;  Surgeon: Sondra Come, MD;  Location: [facility] ORS;  Service: Urology;  Laterality: Right;   FOOT SURGERY Right 09/2012   cyst removed   SPINE SURGERY  2004   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 Authorizing Provider  aspirin EC 81 MG tablet Take 81 mg by mouth daily. Swallow whole.   Yes [provider]  atorvastatin (LIPITOR) 20 MG tablet Take 1 tablet (20 mg total) by mouth daily. 05/12/23  Yes Sherlene Shams, MD  metFORMIN (GLUCOPHAGE-XR) 500 MG 24 hr tablet TAKE 1 TABLET BY MOUTH EVERY DAY WITH BREAKFAST 05/12/23  Yes Sherlene Shams, MD  Multiple Vitamins-Minerals (MULTIVITAMIN WITH MINERALS) tablet Take 1 tablet by mouth  daily.   Yes [provider]  oxyCODONE (ROXICODONE) 5 MG immediate release tablet Take 1.5 tablets (7.5 mg total) by mouth daily. As needed for severe pain 05/12/23  Yes Sherlene Shams, MD  SODIUM FLUORIDE 5000 SENSITIVE 1.1-5 % GEL Brush as directed by your provider. 06/20/23  Yes [provider]  tamsulosin (FLOMAX) 0.4 MG CAPS capsule Take 0.4 mg by mouth daily.   Yes [provider]  telmisartan (MICARDIS) 20 MG tablet Take 1 tablet (20 mg total) by mouth at bedtime. Patient not taking: Reported on 07/11/2023 05/12/23   Sherlene Shams, MD    Social History   Socioeconomic History   Marital status: Legally Separated    Spouse name: Not on file   Number of  children: Not on file   Years of education: Not on file   Highest education level: Bachelor's degree (e.g., BA, AB, BS)  Occupational History   Not on file  Tobacco Use   Smoking status: Never    Passive exposure: Never   Smokeless tobacco: Never  Vaping Use   Vaping status: Never Used  Substance and Sexual Activity   Alcohol use: Never   Drug use: Never   Sexual activity: Not Currently  Other Topics Concern   Not on file  Social History Narrative   Not on file   Social Determinants of Health   Financial Resource Strain: High Risk (12/28/2022)   Overall Financial Resource Strain (CARDIA)    Difficulty of Paying Living Expenses: Very hard  Food Insecurity: No Food Insecurity (07/11/2023)   Hunger Vital Sign    Worried About Running Out of Food in the Last Year: Never true    Ran Out of Food in the Last Year: Never true  Transportation Needs: No Transportation Needs (07/11/2023)   PRAPARE - Administrator, Civil Service (Medical): No    Lack of Transportation (Non-Medical): No  Physical Activity: Unknown (12/28/2022)   Exercise Vital Sign    Days of Exercise per Week: 0 days    Minutes of Exercise per Session: Not on file  Stress: Stress Concern Present (12/28/2022)   [employer] of Occupational Health - Occupational Stress Questionnaire    Feeling of Stress : Rather much  Social Connections: Socially Isolated (12/28/2022)   Social Connection and Isolation Panel [NHANES]    Frequency of Communication with Friends and Family: Never    Frequency of Social Gatherings with Friends and Family: Never    Attends Religious Services: Never    Database administrator or Organizations: No    Attends Engineer, structural: Not on file    Marital Status: Separated  Intimate Partner Violence: Not At Risk (07/11/2023)   Humiliation, Afraid, Rape, and Kick questionnaire    Fear of Current or Ex-Partner: No    Emotionally Abused: No    Physically Abused: No     Sexually Abused: No     Family History  Problem Relation Age of Onset   Heart disease Father    Stroke Father    Heart attack Father 45   Heart disease Maternal Grandmother    Stroke Maternal Grandmother    Heart disease Paternal Grandfather    Stroke Paternal Grandfather     ROS: Otherwise negative unless mentioned in HPI  Physical Examination  Vitals:   07/11/23 0419 07/11/23 0837  BP: (!) 133/58 121/65  Pulse: 81 77  Resp: 18 16  Temp: 98.3 F (36.8 C) 97.6 F (36.4 C)  SpO2: 97% 98%   Body mass index is 36.94 kg/m.  General:  WDWN in NAD Gait: Not observed HENT: WNL, normocephalic Pulmonary: normal non-labored breathing, without Rales, rhonchi,  wheezing Cardiac: regular, without  Murmurs, rubs or gallops; without carotid bruits Abdomen: Positive bowel sounds,  soft, NT/ND, no masses Skin: without rashes Vascular Exam/Pulses: Right lower extremity with +3 edema from upper calf to foot. Unable to palpate pulses due to swelling. Warm to touch and with redness appearing cellulitic. Left lower extremity normal in size, warm to touch with weak but palpable pulses.  Extremities: with ischemic changes, without Gangrene , with cellulitis; with open wounds to dorsal right foot.  Musculoskeletal: no muscle wasting or atrophy. Open wound/ulcer to right foot.   Neurologic: A&O X 3;  No focal weakness or paresthesias are detected; speech is fluent/normal Psychiatric:  The pt has Normal affect. Lymph:  Unremarkable  CBC    Component Value Date/Time   WBC 8.5 07/11/2023 0704   RBC 4.48 07/11/2023 0704   HGB 13.2 07/11/2023 0704   HCT 39.7 07/11/2023 0704   PLT 216 07/11/2023 0704   MCV 88.6 07/11/2023 0704   MCH 29.5 07/11/2023 0704   MCHC 33.2 07/11/2023 0704   RDW 14.6 07/11/2023 0704   LYMPHSABS 1.3 07/10/2023 2144   MONOABS 1.0 07/10/2023 2144   EOSABS 0.3 07/10/2023 2144   BASOSABS 0.2 (H) 07/10/2023 2144    BMET    Component Value Date/Time   NA 129 (L)  07/11/2023 0704   NA 138 04/08/2017 0000   K 3.5 07/11/2023 0704   CL 101 07/11/2023 0704   CO2 20 (L) 07/11/2023 0704   GLUCOSE 339 (H) 07/11/2023 0704   BUN 10 07/11/2023 0704   BUN 13 04/08/2017 0000   CREATININE 0.74 07/11/2023 0704   CREATININE 0.89 02/15/2019 1442   CALCIUM 7.8 (L) 07/11/2023 0704   GFRNONAA >60 07/11/2023 0704   GFRAA >60 09/21/2015 1941    COAGS: Lab Results  Component Value Date   INR 1.3 (H) 12/19/2022   INR 1.3 (H) 12/18/2022   INR 1.3 (A) 04/08/2017     Non-Invasive Vascular Imaging:   MRI of right foot ordered. No reading at this time.   Statin:  Yes.   Beta Blocker:  No. Aspirin:  Yes.   ACEI:  No. ARB:  Yes.   CCB use:  No Other antiplatelets/anticoagulants:  No.    ASSESSMENT/PLAN: This is a 57 y.o. male who presents to [facility] with open wound to right foot. Patient seen by podiatry. MRI ordered. Previous Transmetatarsal amputation with wound to right forefoot open and draining. Podiatry requests Vascular Surgery evaluate for Right lower extremity BKA.   Patient today endorses he would not like to have any amputation at this time.  Therefore we had a long detailed discussion about his prior vascular history and what we can do today to evaluate.  I agreed that the patient would need at the minimum a right lower extremity angiogram with possible intervention to determine blood flow to his foot for either healing purposes or determining what type of amputation he would need such as BKA versus AKA.  Therefore vascular surgery will plan on taking the patient to the vascular lab tomorrow on 07/12/2023 for a right lower extremity angiogram with possible intervention.  I discussed in detail with the patient the procedure, benefits, risk, and complications.  He verbalizes his understanding today and wishes to proceed with the angiogram and possible intervention.  I answered all  the patient's questions at this time.  I will make the patient n.p.o. after  midnight for procedure tomorrow.   -I discussed the case in detail with Dr Festus Barren MD and he agrees with the plan.    Marcie [facility] 07/11/2023 1:31 PM

## 2023-07-12 ENCOUNTER — Encounter: Payer: Self-pay | Admitting: Anesthesiology

## 2023-07-12 ENCOUNTER — Other Ambulatory Visit (HOSPITAL_COMMUNITY): Payer: Self-pay

## 2023-07-12 ENCOUNTER — Telehealth (HOSPITAL_COMMUNITY): Payer: Self-pay | Admitting: Pharmacy Technician

## 2023-07-12 ENCOUNTER — Encounter
Admission: EM | Disposition: A | Discharge status: Home / Self Care | Payer: Self-pay | Source: Home / Self Care | Attending: Internal Medicine

## 2023-07-12 DIAGNOSIS — M86071 Acute hematogenous osteomyelitis, right ankle and foot: Secondary | ICD-10-CM | POA: Diagnosis not present

## 2023-07-12 DIAGNOSIS — L089 Local infection of the skin and subcutaneous tissue, unspecified: Secondary | ICD-10-CM

## 2023-07-12 DIAGNOSIS — L97519 Non-pressure chronic ulcer of other part of right foot with unspecified severity: Secondary | ICD-10-CM | POA: Diagnosis not present

## 2023-07-12 HISTORY — PX: LOWER EXTREMITY ANGIOGRAPHY: CATH118251

## 2023-07-12 LAB — BASIC METABOLIC PANEL
Anion gap: 10 (ref 5–15)
BUN: 13 mg/dL (ref 6–20)
CO2: 22 mmol/L (ref 22–32)
Calcium: 7.9 mg/dL — ABNORMAL LOW (ref 8.9–10.3)
Chloride: 102 mmol/L (ref 98–111)
Creatinine, Ser: 0.74 mg/dL (ref 0.61–1.24)
GFR, Estimated: >60 mL/min (ref >60)
Glucose, Bld: 273 mg/dL — ABNORMAL HIGH (ref 70–99)
Potassium: 3.9 mmol/L (ref 3.5–5.1)
Sodium: 134 mmol/L — ABNORMAL LOW (ref 135–145)

## 2023-07-12 LAB — GLUCOSE, CAPILLARY
Glucose-Capillary: 243 mg/dL — ABNORMAL HIGH (ref 70–99)
Glucose-Capillary: 228 mg/dL — ABNORMAL HIGH (ref 70–99)
Glucose-Capillary: 143 mg/dL — ABNORMAL HIGH (ref 70–99)
Glucose-Capillary: 220 mg/dL — ABNORMAL HIGH (ref 70–99)

## 2023-07-12 LAB — MAGNESIUM: Magnesium: 2.2 mg/dL (ref 1.7–2.4)

## 2023-07-12 SURGERY — LOWER EXTREMITY ANGIOGRAPHY
Anesthesia: Moderate Sedation | Laterality: Right

## 2023-07-12 MED ORDER — FENTANYL CITRATE (PF) 100 MCG/2ML IJ SOLN
INTRAMUSCULAR | Status: AC
  Filled 2023-07-12: qty 2

## 2023-07-12 MED ORDER — HEPARIN (PORCINE) IN NACL 2000-0.9 UNIT/L-% IV SOLN
INTRAVENOUS | Status: DC | PRN
  Administered 2023-07-12: 1000 mL

## 2023-07-12 MED ORDER — LIDOCAINE-EPINEPHRINE (PF) 1 %-1:200000 IJ SOLN
INTRAMUSCULAR | Status: DC | PRN
  Administered 2023-07-12: 10 mL

## 2023-07-12 MED ORDER — MIDAZOLAM HCL 5 MG/5ML IJ SOLN
INTRAMUSCULAR | Status: AC
  Filled 2023-07-12: qty 5

## 2023-07-12 MED ORDER — MIDAZOLAM HCL 2 MG/ML PO SYRP
8.0000 mg | ORAL_SOLUTION | Freq: Once | ORAL | Status: DC | PRN
  Filled 2023-07-12: qty 5

## 2023-07-12 MED ORDER — FENTANYL CITRATE PF 50 MCG/ML IJ SOSY: 12.5000 ug | PREFILLED_SYRINGE | Freq: Once | INTRAMUSCULAR | Status: DC | PRN

## 2023-07-12 MED ORDER — IODIXANOL 320 MG/ML IV SOLN
INTRAVENOUS | Status: DC | PRN
  Administered 2023-07-12: 25 mL

## 2023-07-12 MED ORDER — MIDAZOLAM HCL 2 MG/2ML IJ SOLN
INTRAMUSCULAR | Status: DC | PRN
  Administered 2023-07-12: 1 mg via INTRAVENOUS

## 2023-07-12 MED ORDER — HEPARIN SODIUM (PORCINE) 1000 UNIT/ML IJ SOLN
INTRAMUSCULAR | Status: AC
  Filled 2023-07-12: qty 10

## 2023-07-12 MED ORDER — DIPHENHYDRAMINE HCL 50 MG/ML IJ SOLN: 50.0000 mg | Freq: Once | INTRAMUSCULAR | Status: DC | PRN

## 2023-07-12 MED ORDER — SODIUM CHLORIDE 0.9 % IV SOLN: INTRAVENOUS | Status: DC

## 2023-07-12 MED ORDER — FAMOTIDINE 20 MG PO TABS: 40.0000 mg | ORAL_TABLET | Freq: Once | ORAL | Status: DC | PRN

## 2023-07-12 MED ORDER — FENTANYL CITRATE (PF) 100 MCG/2ML IJ SOLN
INTRAMUSCULAR | Status: DC | PRN
  Administered 2023-07-12: 50 ug via INTRAVENOUS

## 2023-07-12 MED ORDER — METHYLPREDNISOLONE SODIUM SUCC 125 MG IJ SOLR: 125.0000 mg | Freq: Once | INTRAMUSCULAR | Status: DC | PRN

## 2023-07-12 SURGICAL SUPPLY — 9 items
CATH ANGIO 5F PIGTAIL 65CM (CATHETERS) IMPLANT
COVER PROBE ULTRASOUND 5X96 (MISCELLANEOUS) IMPLANT
DEVICE STARCLOSE SE CLOSURE (Vascular Products) IMPLANT
GLIDEWIRE ADV .035X260CM (WIRE) IMPLANT
PACK ANGIOGRAPHY (CUSTOM PROCEDURE TRAY) ×1 IMPLANT
SHEATH BRITE TIP 5FRX11 (SHEATH) IMPLANT
SYR MEDRAD MARK 7 150ML (SYRINGE) IMPLANT
TUBING CONTRAST HIGH PRESS 72 (TUBING) IMPLANT
WIRE GUIDERIGHT .035X150 (WIRE) IMPLANT

## 2023-07-12 NOTE — Op Note (Signed)
[facility]  Percutaneous Study/Intervention Procedural Note   Date of Surgery: 07/12/2023  Surgeon(s):Lakeena Downie    Assistants:none  Pre-operative Diagnosis: ulceration and infection right foot  Post-operative diagnosis:  Same  Procedure(s) Performed:             1.  Ultrasound guidance for vascular access left femoral artery             2.  Catheter placement into right SFA from right femoral approach             3.  Aortogram and selective right lower extremity angiogram             4.  StarClose closure device left femoral artery  EBL: 5 cc  Contrast: 25 cc  Fluoro Time: 0.8 minutes  Moderate Conscious Sedation Time: approximately 5 minutes using 1 mg of Versed and 50 mcg of Fentanyl              Indications:  Patient is a 57 y.o.male with an infected ulceration on the right foot. The patient is brought in for angiography for further evaluation and potential treatment.  Due to the limb threatening nature of the situation, angiogram was performed for attempted limb salvage. The patient is aware that if the procedure fails, amputation would be expected.  The patient also understands that even with successful revascularization, amputation may still be required due to the severity of the situation.  Risks and benefits are discussed and informed consent is obtained.   Procedure:  The patient was identified and appropriate procedural time out was performed.  The patient was then placed supine on the table and prepped and draped in the usual sterile fashion. Moderate conscious sedation was administered during a face to face encounter with the patient throughout the procedure with my supervision of the RN administering medicines and monitoring the patient's vital signs, pulse oximetry, telemetry and mental status throughout from the start of the procedure until the patient was taken to the recovery room. Ultrasound was used to evaluate the left common femoral artery.   It was patent .  A digital ultrasound image was acquired.  A Seldinger needle was used to access the left common femoral artery under direct ultrasound guidance and a permanent image was performed.  A 0.035 J wire was advanced without resistance and a 5Fr sheath was placed.  Pigtail catheter was placed into the aorta and an AP aortogram was performed. This demonstrated normal renal arteries and normal aorta and iliac segments without significant stenosis. I then crossed the aortic bifurcation and advanced to the right femoral head. After the first image, the catheter was advanced down into the mid SFA to opacify distally. Selective right lower extremity angiogram was then performed. This demonstrated normal common femoral artery, SFA and popliteal arteries.  There was a normal trifurcation with large tibial vessels with all three vessels continuous and patent distally. He had adequate flow and no revascularization needed. I elected to terminate the procedure. The sheath was removed and StarClose closure device was deployed in the left femoral artery with excellent hemostatic result. The patient was taken to the recovery room in stable condition having tolerated the procedure well.  Findings:               Aortogram:  This demonstrated normal renal arteries and normal aorta and iliac segments without significant stenosis.             Right Lower Extremity:  This demonstrated normal  common femoral artery, SFA and popliteal arteries.  There was a normal trifurcation with large tibial vessels with all three vessels continuous and patent distally.   Disposition: Patient was taken to the recovery room in stable condition having tolerated the procedure well.  Complications: None  Festus Barren 07/12/2023 4:03 PM   This note was created with Dragon Medical transcription system. Any errors in dictation are purely unintentional.

## 2023-07-12 NOTE — Progress Notes (Signed)
Pt has arrived onto unit from procedure. Pt is ao x4, respirations even and unlabored on RA. Pt L groin visualized with PACU RN at bedside, dressing is CDI. Pt having some pain, see MAR. Pt lying flat in bed at this time till 1730, pt unable to get oob till 1800 per report. Vitals stable at this time. Pt has no other concerns at this time

## 2023-07-12 NOTE — Progress Notes (Signed)
Introduced patient to role of Statistician. Intake questions completed.  Patient states he currently lives alone, as he and his wife separated in May of this year. He pays $1200/mo in rent which also covers his water/trash and cable. Additional bills being electric and truck payment. He does endorse food insecurity and states he visits a food pantry twice weekly. 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  He works at the [employer] in [place].  Agreeable to referral to [employer] for assistance with food insecurity.  Patient states he does drive to/from appointments and work, but often has to reschedule because he can't afford gas.  He has established primary care with Dr. Darrick Huntsman, who also manages his depression. He denies need for a mental health provider at this point.

## 2023-07-12 NOTE — Discharge Instructions (Addendum)
Your nurse navigator Tiffanie can be reached at [phone]  Food Resources  Agency Name: [facility] Address: [address] [place], [place] [address] Phone: [phone] Website: [email or web address] Service(s) Offered: Housing services, self-sufficiency, congregate meal program, weatherization program, Event organiser program, emergency food assistance,  housing counseling, home ownership program, wheels - to work program.  [employer] free for 60 and older at various locations from [employer], Monday-Friday:  [employer], [address] [place], [phone] -[facility], [address] [place] [phone]  -[facility], [address], [place] [phone]  -[address], [address] [place], [phone]  Agency Name: [facility] Address: [phone] [address], [place], [place] [address] Phone: [phone] Website: www.[email or web address] Service(s) Offered: Home delivered hot, frozen, and emergency  meals. Grocery assistance program which matches  volunteers one-on-one with seniors unable to grocery shop  for themselves. Must be 60 years and older; less than 20  hours of in-home aide service, limited or no driving ability;  live alone or with someone with a disability; live in  [place].  Agency Name: [facility] [employer]) Address: [address], [place], [place] [address] Phone: [phone] Service(s) Offered: Food is served to shut-ins, homeless, elderly, and low income people in the community every Saturday (11:30 am-12:30 pm) and Sunday (12:30 pm-1:30pm). Volunteers also offer help and encouragement in seeking employment,  and spiritual guidance.  Agency Name: Department [employer] Social Services Address: [address] [place], [place] [address] Phone: [phone] Service(s) Offered: Child support services; child welfare  services; food stamps; Medicaid; work first family assistance; and aid with fuel,  rent, food and medicine.  Agency Name: [employer] Address: [address] [place], [place] Phone: [phone] Website: www.dreamalign.com Services Offered: Monday 10:00am-12:00, 8:00pm-9:00pm, and Friday 10:00am-12:00.  Agency Name: [employer] [facility] Address: [address] [place], [place] [address] Phone: [phone] Website: [email or web address] Service(s) Offered: Serves weekday meals, open from 11:30 am- 1:00 pm., and 6:30-7:30pm, Monday-Wednesday-Friday distributes food 3:30-6pm, Monday-Wednesday-Friday.  Agency Name: [facility] Address: [address], [place], [place] Phone: [phone] Website: [email or web address] Services Offered: Distributes food the 4th Saturday of the month, starting at 8:00 am  Agency Name: [facility] Address: [phone] [address] [place], [place] [address] Phone: [phone] Website: [email or web address] Service(s) Offered: Bread of life, weekly food pantry. Open Wednesdays from 10:00am-noon.  Agency Name: [facility] [employer] Address: [address] [place], [place], [place] Phone: [phone] Services Offered: Distributes food 9am-1pm, Monday-Thursday. Call for details.  Agency Name: [facility] Address: [address] [place], [place] [address] Phone: [phone] Website: [email or web address] Service(s) Offered: Games developer. Call for assistance.  Agency Name: [facility] Address: [address] [place], [place] [address] Phone: [phone] Service Offered: Emergency Food Pantry. Call for appointment.  Agency Name: [facility] Address: [address], [place], [place] [address] Phone: [phone] Website: [email or web address] Services Offered: Games developer. Call for details  Agency  Name: [facility] Address: [address] [place], [place] Phone: [phone] Website: [email or web address] Service(s) Offered: Emergency Food Pantry. Call for details.  Agency Name: Holiday representative Address: [address], [place], [place] [address] Phone: [phone] or [phone] Website: [email or web address] Service(s) Offered: Distribute food 9am-11:30 am, Tuesday-Friday, and 1-3:30pm, Monday-Friday. Food pantry Monday-Friday 1pm-3pm, fresh items, Mon.-Wed.-Fri.  Agency Name: [facility] Address: [address] [place], [place] [address] Phone: [phone] Website:  www.[email or web address].org Services Offered: Distribute food Tues and Sats from 9:00am-noon. Closed 1st Saturday of each month. Call for details  Agency Name: [employer] Address: [address] [facility] [address], [place] [address] Phone: [phone]  Services Offered: Delivers meals every Thursday

## 2023-07-12 NOTE — Inpatient Diabetes Management (Addendum)
Inpatient Diabetes Program Recommendations  AACE/ADA: New Consensus Statement on Inpatient Glycemic Control (2015)  Target Ranges:  Prepandial:   less than 140 mg/dL      Peak postprandial:   less than 180 mg/dL (1-2 hours)      Critically ill patients:  140 - 180 mg/dL    Latest Reference Range & Units 09/21/22 07:36 12/29/22 09:45 07/11/23 07:04  Hemoglobin A1C 4.8 - 5.6 % 6.3 7.0 (H) 8.6 (H)  200 mg/dl  (H): Data is abnormally high  Latest Reference Range & Units 07/11/23 09:32 07/11/23 12:14 07/11/23 17:45 07/11/23 20:47  Glucose-Capillary 70 - 99 mg/dL 098 (H)  15 units Novolog  319 (H)  15 units Novolog  323 (H)  15 units Novolog  320 (H)  4 units Novolog   (H): Data is abnormally high  Latest Reference Range & Units 07/12/23 08:27  Glucose-Capillary 70 - 99 mg/dL 119 (H)  (H): Data is abnormally high   Admit Cellulitis RLE/ Suspect acute on chronic osteomyelitis/ Infected diabetic foot ulcer/ Lactic acidosis, possible Sepsis  History: DM2   Home: Metformin 500 mg QAM   Current: Novolog 0-20 units TID ac/hs     MD- Note CBG 243 this AM.  CBGs >200 all day yest.  Novolog SSI started yest.  Please consider for in-hospital glucose control: Start Semglee 10 units Daily (0.1 units/kg)  May benefit from Increase of home Metformin dose and possible addition of another oral DM med?    Addendum 11:45am--Met w/ pt today at bedside.  Pt stated to me he sees PCP Dr. Darrick Huntsman on regular basis.  Has CBG meter at home but rarely checks--Stated to me and the MD "why would I check more often if there isn't anything I can do about my CBG if it's 200?".  Has been taking Metformin 500 mg daily for a long time.  Had a Sensor at one point (the Dexcom G6) but only got 1 sample to try.  Has 2 other traditional fingerstick CBG meters at home and has supplies.  Told me he has been trying to eat better since his wife and him separated, however, he is having to be careful with his budget  b/c he now has a car payment and his rent is $1200 per month.  Works for the [employer] and has insurance.  Eats most meals from home (takes lunch to work) and has tried [employer] as well.  Rarely eats sweets and does not drink sugary beverages.    We discussed how his A1c has risen from 7% to 8.6% and how he is currently above goal.  Reviewed healthy CBG goals for home.  Discussed with pt that I am unsure if elevated CBGs led to his infection or if his infection led to elevated CBGs, however, regardless, pt needs better control at home.  Is willing to use Dexcom CGM sensors to check glucose levels if covered by his insurance.  His phone is not compatible with the Dexcom App, however, we could prescribe him a Dexcom G7 receiver when he goes home.  Have asked OP pharmacy to see if his insurance will cover Dexcom G7 sensors.  Encouraged pt to check his CBGs more frequently at home (whether by fingerstick or sensor) so he can contact his MD and be seen if he is having frequent elevations.  Discussed w/ pt that the MD in the hospital may want to increase his Metformin dose for home and may add another oral agent--pt OK with  this for now.  Does not want to start insulin yet.       --Will follow patient during hospitalization--  Ambrose Finland RN, MSN, CDCES Diabetes Coordinator Inpatient Glycemic Control Team Team Pager: [phone] (8a-5p)

## 2023-07-12 NOTE — Telephone Encounter (Signed)
Pharmacy Patient Advocate Encounter   Received notification that prior authorization for Dexcom G7 Sensor is required/requested.   Insurance verification completed.   The patient is insured through [facility] .   Per test claim: PA required; PA submitted to above mentioned insurance via CoverMyMeds Key/confirmation #/EOC [id number] Status is pending

## 2023-07-12 NOTE — Interval H&P Note (Signed)
History and Physical Interval Note:  07/12/2023 3:15 PM  [name]  has presented today for surgery, with the diagnosis of Right foot osteomyelitis.  The various methods of treatment have been discussed with the patient and family. After consideration of risks, benefits and other options for treatment, the patient has consented to  Procedure(s): Lower Extremity Angiography (Right) as a surgical intervention.  The patient's history has been reviewed, patient examined, no change in status, stable for surgery.  I have reviewed the patient's chart and labs.  Questions were answered to the patient's satisfaction.     Festus Barren

## 2023-07-12 NOTE — Progress Notes (Signed)
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 Continue on IV cefepime, vanco. Will go for angiogram today as per vasc surg. Podiatry following and recs apprec     Sepsis ruled out: did not meet criteria   DM2: poorly controlled, HbA1c 8.6. Holding home dose of metformin. Continue on SSI w/ accuchecks. DM coordinator following and recs apprec     Morbid obesity: BMI 36.9. Co-morbid conditions of DM2, HTN, OSA. Complicates overall care & prognosis   HTN: holding home dose of telmisartan    OSA: does not currently use CPAP    Hyponatremia: almost WNL today        DVT prophylaxis: lovenox Code Status: full  Family Communication:  Disposition Plan: depends on PT/OT recs (not consulted yet)   Level of care: Telemetry Medical Status is: Inpatient Remains inpatient appropriate because: severity of illness, angiogram today     Consultants:  Vasc surg Podiatry   Procedures:   Antimicrobials: cefepime, vanco    Subjective: Pt c/o malaise   Objective: Vitals:   07/11/23 0837 07/11/23 1649 07/11/23 2003 07/12/23 0500  BP: 121/65 135/79 126/64 119/67  Pulse: 77 82 75 70  Resp: 16 19 17 18   Temp: 97.6 F (36.4 C) 98 F  (36.7 C) 98 F (36.7 C) 98.8 F (37.1 C)  TempSrc:  Oral Oral   SpO2: 98% 100% 99% 99%  Weight:      Height:        Intake/Output Summary (Last 24 hours) at 07/12/2023 0820 Last data filed at 07/11/2023 1121 Gross per 24 hour  Intake 240 ml  Output --  Net 240 ml   Filed Weights   07/10/23 2142  Weight: 100.7 kg    Examination:  General exam: Appears calm and comfortable  Respiratory system: Clear to auscultation. Respiratory effort normal. Cardiovascular system: S1 & S2+. No  rubs, gallops or clicks.  Gastrointestinal system: Abdomen is obese, soft and nontender.  Normal bowel sounds heard. Central nervous system: Alert and oriented. Moves all extremities  Psychiatry: Judgement and insight appear normal. Mood & affect appropriate.     Data Reviewed: I have personally reviewed following labs and imaging studies  CBC: Recent Labs  Lab 07/10/23 2144 07/11/23 0704  WBC 9.3 8.5  NEUTROABS 6.4  --   HGB 15.1 13.2  HCT 45.3 39.7  MCV 90.4 88.6  PLT 249 216   Basic Metabolic Panel: Recent Labs  Lab 07/10/23 2144 07/11/23 0704 07/12/23 0512  NA 132* 129* 134*  K 4.6 3.5 3.9  CL 103 101 102  CO2 22 20* 22  GLUCOSE 480* 339* 273*  BUN 15 10 13   CREATININE 0.91 0.74 0.74  CALCIUM 8.2* 7.8* 7.9*  MG  --   --  2.2   GFR: Estimated Creatinine Clearance: 109.9 mL/min (by C-G formula based on SCr of 0.74 mg/dL). Liver Function Tests: No results for input(s): "AST", "ALT", "ALKPHOS", "BILITOT", "PROT", "ALBUMIN" in the last 168 hours. No results for input(s): "LIPASE", "AMYLASE" in the last 168 hours. No results for input(s): "AMMONIA" in the last 168 hours. Coagulation Profile: No results for input(s): "INR", "PROTIME" in the last 168 hours. Cardiac Enzymes: No results for input(s): "CKTOTAL", "CKMB", "CKMBINDEX", "TROPONINI" in the last 168 hours. BNP (last 3 results) No results for input(s): "PROBNP" in the last 8760 hours. HbA1C: Recent Labs     07/11/23 0704  HGBA1C 8.6*   CBG: Recent Labs  Lab 07/11/23 0932 07/11/23 1214 07/11/23 1745 07/11/23 2047  GLUCAP 327* 319* 323* 320*   Lipid Profile: No results for input(s): "CHOL", "HDL", "LDLCALC", "TRIG", "CHOLHDL", "LDLDIRECT" in the last 72 hours. Thyroid Function Tests: No results for input(s): "TSH", "T4TOTAL", "FREET4", "T3FREE", "THYROIDAB" in the last 72 hours. Anemia Panel: No results for input(s): "VITAMINB12", "FOLATE", "FERRITIN", "TIBC", "IRON", "RETICCTPCT" in the last 72 hours. Sepsis Labs: Recent Labs  Lab 07/10/23 2144 07/11/23 0018 07/11/23 0256  LATICACIDVEN 2.1* 2.6* 2.2*    Recent Results (from the past 240 hour(s))  Blood Culture (routine x 2)     Status: None (Preliminary result)   Collection Time: 07/11/23 12:18 AM   Specimen: BLOOD  Result Value Ref Range Status   Specimen Description BLOOD RIGHT HAND  Final   Special Requests   Final    BOTTLES DRAWN AEROBIC AND ANAEROBIC Blood Culture results may not be optimal due to an inadequate volume of blood received in culture bottles   Culture   Final    NO GROWTH 1 DAY Performed at [facility], [address] [place], [place] [address]    Report Status PENDING  Incomplete  Blood Culture (routine x 2)     Status: None (Preliminary result)   Collection Time: 07/11/23 12:18 AM   Specimen: BLOOD  Result Value Ref Range Status   Specimen Description BLOOD RIGHT ANTECUBITAL  Final   Special Requests   Final    BOTTLES DRAWN AEROBIC AND ANAEROBIC Blood Culture results may not be optimal due to an excessive volume of blood received in culture bottles   Culture   Final    NO GROWTH 1 DAY Performed at [facility], [address] [place], [place] [address]    Report Status PENDING  Incomplete  MRSA Next Gen by PCR, Nasal     Status: None   Collection Time: 07/11/23  5:30 AM   Specimen: Nasal Mucosa; Nasal Swab  Result Value Ref Range Status   MRSA by PCR Next Gen NOT  DETECTED NOT DETECTED Final    Comment: (NOTE) The GeneXpert MRSA Assay (FDA approved for NASAL specimens only), is one component of a comprehensive MRSA colonization surveillance program. It is not intended to diagnose MRSA infection nor to guide or monitor treatment for MRSA infections. Test performance is not FDA approved in patients less than 9 years old. Performed at [facility], [address] [place], [place] [address]          Radiology Studies: MR FOOT RIGHT W WO CONTRAST  Result Date: 07/11/2023 CLINICAL DATA:  Right foot infection.  History of amputation. EXAM: MRI OF THE RIGHT FOREFOOT WITHOUT AND WITH CONTRAST TECHNIQUE: Multiplanar, multisequence MR imaging of the right foot was performed before and after the administration of intravenous contrast. CONTRAST:  9mL GADAVIST GADOBUTROL 1 MMOL/ML IV SOLN COMPARISON:  Right foot x-rays from same day. MRI right foot dated December 18, 2022. FINDINGS: Bones/Joint/Cartilage Prior transmetatarsal amputation. New intense marrow edema with associated enhancement and corresponding decreased T1 marrow signal involving the medial navicular, the entire medial and middle cuneiforms, the inferior aspect of the lateral cuneiform, and the residual second and third metatarsal bases. No fracture or joint effusion. Ligaments Medial and lateral ankle ligaments are grossly intact. Muscles and Tendons No tenosynovitis. Soft tissue Plantar ulceration of the medial aspect of the foot at the level of the medial cuneiform, extending to bone (series 7, image 27). Prominent surrounding soft tissue swelling with enhancement. No fluid collection. No soft tissue mass. IMPRESSION: 1. Plantar ulceration of the medial aspect of the foot at the level of the medial cuneiform, extending to bone. Acute osteomyelitis of the medial navicular, the cuneiforms, and the residual second and third metatarsal bases. No abscess. Electronically Signed   By: Obie Dredge  M.D.   On: 07/11/2023 16:23   DG Foot Complete Right  Result Date: 07/11/2023 CLINICAL DATA:  57 year old male with history of osteomyelitis status post amputation. Wound on the bottom of the right foot since April. EXAM: RIGHT FOOT COMPLETE - 3+ VIEW COMPARISON:  No priors. FINDINGS: Status post amputation of the foot beyond the proximal aspects of the metatarsals. Some lucency is noted in several bones, most apparent in the neck of the talus. Lucency and poorly defined trabeculations within sesamoid bones also noted. Multifocal joint space narrowing, subchondral sclerosis, subchondral cyst formation and osteophyte formation throughout the remaining portions of the midfoot and hindfoot indicative of osteoarthritis. Soft tissues are indistinct surrounding the remaining portion of the foot and appear mildly swollen. IMPRESSION: 1. Multiple areas of lucency in the remaining midfoot and hindfoot, most apparent in the neck of the talus. Underlying osteomyelitis should be considered. Further evaluation with MRI of the foot with and without IV gadolinium could provide additional diagnostic information. Electronically Signed   By: Trudie Reed M.D.   On: 07/11/2023 05:28        Scheduled Meds:  aspirin EC  81 mg Oral Daily   atorvastatin  20 mg Oral Daily   enoxaparin (LOVENOX) injection  50 mg Subcutaneous Q24H   influenza vac split trivalent PF  0.5 mL Intramuscular Tomorrow-1000   insulin aspart  0-20 Units Subcutaneous TID WC   insulin aspart  0-5 Units Subcutaneous QHS   tamsulosin  0.4 mg Oral Daily   Continuous Infusions:  ceFEPime (MAXIPIME) IV 2 g (07/11/23 2320)   vancomycin 1,250 mg (07/12/23 0007)     LOS: 1 day      Charise Killian, MD [facility] Pager [phone]  If 7PM-7AM, please contact night-coverage [email or web address] 07/12/2023, 8:20 AM

## 2023-07-12 NOTE — TOC Benefit Eligibility Note (Signed)
Patient Product/process development scientist completed.    The patient is insured through [facility]. Patient has [employer], may use a copay card, and/or apply for patient assistance if available.    Ran test claim for [employer] and Requires Prior Authorization  Ran test claim for [employer] and Non Formulary  This test claim was processed through [employer]- copay amounts may vary at other pharmacies due to [employer], or as the patient moves through the different stages of their insurance plan.     Roland Earl, CPHT Pharmacy Technician III Certified Patient Advocate [facility] Patient Advocate Team Direct Number: [phone]  Fax: [phone]

## 2023-07-13 ENCOUNTER — Other Ambulatory Visit (HOSPITAL_COMMUNITY): Payer: Self-pay

## 2023-07-13 ENCOUNTER — Encounter: Payer: Self-pay | Admitting: Vascular Surgery

## 2023-07-13 DIAGNOSIS — M86071 Acute hematogenous osteomyelitis, right ankle and foot: Secondary | ICD-10-CM | POA: Diagnosis not present

## 2023-07-13 LAB — BASIC METABOLIC PANEL
Anion gap: 9 (ref 5–15)
BUN: 13 mg/dL (ref 6–20)
CO2: 24 mmol/L (ref 22–32)
Calcium: 8.2 mg/dL — ABNORMAL LOW (ref 8.9–10.3)
Chloride: 103 mmol/L (ref 98–111)
Creatinine, Ser: 0.8 mg/dL (ref 0.61–1.24)
GFR, Estimated: >60 mL/min (ref >60)
Glucose, Bld: 167 mg/dL — ABNORMAL HIGH (ref 70–99)
Potassium: 4.6 mmol/L (ref 3.5–5.1)
Sodium: 136 mmol/L (ref 135–145)

## 2023-07-13 LAB — CBC
HCT: 41.2 % (ref 39.0–52.0)
Hemoglobin: 13.5 g/dL (ref 13.0–17.0)
MCH: 29.7 pg (ref 26.0–34.0)
MCHC: 32.8 g/dL (ref 30.0–36.0)
MCV: 90.5 fL (ref 80.0–100.0)
Platelets: 264 10*3/uL (ref 150–400)
RBC: 4.55 MIL/uL (ref 4.22–5.81)
RDW: 14.6 % (ref 11.5–15.5)
WBC: 8.2 10*3/uL (ref 4.0–10.5)
nRBC: 0 % (ref 0.0–0.2)

## 2023-07-13 LAB — GLUCOSE, CAPILLARY
Glucose-Capillary: 257 mg/dL — ABNORMAL HIGH (ref 70–99)
Glucose-Capillary: 276 mg/dL — ABNORMAL HIGH (ref 70–99)
Glucose-Capillary: 212 mg/dL — ABNORMAL HIGH (ref 70–99)
Glucose-Capillary: 256 mg/dL — ABNORMAL HIGH (ref 70–99)

## 2023-07-13 LAB — MAGNESIUM: Magnesium: 2.6 mg/dL — ABNORMAL HIGH (ref 1.7–2.4)

## 2023-07-13 MED ORDER — SODIUM CHLORIDE 0.9 % IV SOLN
3.0000 g | Freq: Four times a day (QID) | INTRAVENOUS | Status: DC
  Administered 2023-07-13 – 2023-07-15 (×8): 3 g via INTRAVENOUS
  Filled 2023-07-13 (×9): qty 8

## 2023-07-13 MED ORDER — VANCOMYCIN HCL IN DEXTROSE 1-5 GM/200ML-% IV SOLN
1000.0000 mg | Freq: Two times a day (BID) | INTRAVENOUS | Status: DC
  Administered 2023-07-13 – 2023-07-15 (×4): 1000 mg via INTRAVENOUS
  Filled 2023-07-13 (×6): qty 200

## 2023-07-13 NOTE — Telephone Encounter (Signed)
Pharmacy Patient Advocate Encounter  Received notification from [facility] that Prior Authorization for Dexcom G7 Sensor  has been APPROVED from 07/12/2023 to 07/11/2024. Ran test claim, Copay is $30.00. This test claim was processed through [facility]- copay amounts may vary at other pharmacies due to pharmacy/plan contracts, or as the patient moves through the different stages of their insurance plan.   PA #/Case ID/Reference #: [id number]

## 2023-07-13 NOTE — TOC CM/SW Note (Signed)
Transition of Care [facility]) - Inpatient Brief Assessment   Patient Details  Name: [name] MRN: [id number] Date of Birth: [birth year]  Transition of Care [facility]) CM/SW Contact:    Margarito Liner, LCSW Phone Number: 07/13/2023, 9:41 AM   Clinical Narrative: CSW reviewed chart. No TOC needs identified at this time. CSW will continue to follow progress. Please place [facility] consult if any needs arise.  Transition of Care Asessment: Insurance and Status: Insurance coverage has been reviewed Patient has primary care physician: Yes Home environment has been reviewed: Apartment Prior level of function:: Not documented Prior/Current Home Services: No current home services Social Determinants of Health Reivew: SDOH reviewed no interventions necessary Readmission risk has been reviewed: Yes Transition of care needs: no transition of care needs at this time

## 2023-07-13 NOTE — Progress Notes (Signed)
Progress Note    07/13/2023 12:25 PM 1 Day Post-Op  Subjective:  [name] is a 57 yo male who is POD #1 from right lower extremity angiogram.  Angiogram results therefore this would be considered a diagnostic angiogram of the patient's right lower extremity.  He has adequate blood flow for either knee amputation or other surgical procedure required him to heal properly.  On exam this morning patient is sitting in bed resting comfortably.  Endorses not having any pain related to the angiogram.  However he does continue to have some pain to his foot when he open ulceration is.  He endorses nothing is change related to his wound.  Patient states that he is not ready to make a decision at this point time as to whether to proceed with amputation proceed with another course of action either surgical or nonsurgical.   Vitals:   07/13/23 0412 07/13/23 0828  BP: 121/63 135/73  Pulse: 80 95  Resp: 16 16  Temp: 98.3 F (36.8 C) 98.1 F (36.7 C)  SpO2: 97% 96%   Physical Exam: Cardiac:  RRR, normal S1, S2.  Murmurs Lungs: Clear on auscultation throughout, normal labored breathing, no rales rhonchi or wheezing. Incisions: Right femoral incision with dressing clean dry.  No complications no hematoma seroma to note. Extremities: Bilateral lower extremities warm to touch.  Difficult to palpate DP and PT pulses due to swelling in his right lower extremity.  Palpable DP PT pulses on the left. Abdomen: Positive bowel sounds throughout, soft, nontender and nondistended. Neurologic: Alert and oriented x 4, answers questions and follows commands  .  CBC    Component Value Date/Time   WBC 8.2 07/13/2023 0520   RBC 4.55 07/13/2023 0520   HGB 13.5 07/13/2023 0520   HCT 41.2 07/13/2023 0520   PLT 264 07/13/2023 0520   MCV 90.5 07/13/2023 0520   MCH 29.7 07/13/2023 0520   MCHC 32.8 07/13/2023 0520   RDW 14.6 07/13/2023 0520   LYMPHSABS 1.3 07/10/2023 2144   MONOABS 1.0 07/10/2023 2144    EOSABS 0.3 07/10/2023 2144   BASOSABS 0.2 (H) 07/10/2023 2144    BMET    Component Value Date/Time   NA 136 07/13/2023 0520   NA 138 04/08/2017 0000   K 4.6 07/13/2023 0520   CL 103 07/13/2023 0520   CO2 24 07/13/2023 0520   GLUCOSE 167 (H) 07/13/2023 0520   BUN 13 07/13/2023 0520   BUN 13 04/08/2017 0000   CREATININE 0.80 07/13/2023 0520   CREATININE 0.89 02/15/2019 1442   CALCIUM 8.2 (L) 07/13/2023 0520   GFRNONAA >60 07/13/2023 0520   GFRAA >60 09/21/2015 1941    INR    Component Value Date/Time   INR 1.3 (H) 12/19/2022 0600     Intake/Output Summary (Last 24 hours) at 07/13/2023 1225 Last data filed at 07/13/2023 0900 Gross per 24 hour  Intake 240 ml  Output --  Net 240 ml     Assessment/Plan:  57 y.o. male is s/p right lower extremity angiogram.  1 Day Post-Op  PLAN:  Right lower extremity angiogram was diagnostic.  Patient has plenty of blood flow to either undergo surgery for below the knee amputation or other surgical interventions or antibiotic therapy.  Patient is undecided today about the care that he would like to proceed with.  He endorses that he has been battling this for 12 to 13 years now but states he does not really want to lose his foot at this time.  He does endorse that he has seen Dr. Melody Haver from [facility] who almost had his right foot healed in the past and is leaning toward this type of treatment.  Therefore vascular surgery at this time will sign off.  If the patient wishes to proceed with a right lower below the knee amputation we will be more than glad to see the patient again in the future.   DVT prophylaxis: Lovenox 50 mg subcu every 24 hours   Marcie [facility] 07/13/2023 12:25 PM

## 2023-07-13 NOTE — Inpatient Diabetes Management (Addendum)
Inpatient Diabetes Program Recommendations  AACE/ADA: New Consensus Statement on Inpatient Glycemic Control (2015)  Target Ranges:  Prepandial:   less than 140 mg/dL      Peak postprandial:   less than 180 mg/dL (1-2 hours)      Critically ill patients:  140 - 180 mg/dL    Latest Reference Range & Units 07/12/23 08:27 07/12/23 11:17 07/12/23 17:27 07/12/23 21:04  Glucose-Capillary 70 - 99 mg/dL 409 (H)  7 units Novolog @0955  228 (H)  7 units Novolog @1327  143 (H)  3 units Novolog  220 (H)  (H): Data is abnormally high  Latest Reference Range & Units 07/13/23 08:29  Glucose-Capillary 70 - 99 mg/dL 811 (H)  11 units Novolog   (H): Data is abnormally high    Admit Cellulitis RLE/ Suspect acute on chronic osteomyelitis/ Infected diabetic foot ulcer/ Lactic acidosis, possible Sepsis   History: DM2   Home: Metformin 500 mg QAM   Current: Novolog 0-20 units TID ac/hs    MD- Note CBG 257 this AM    Please consider for in-hospital glucose control: Start Semglee 10 units Daily (0.1 units/kg)   May benefit from Increase of home Metformin dose and possible addition of another oral DM med?   Addendum 12:55pm--Met w/ pt at bedside again today.  Reviewed current CBGs with pt and CBGs from the last 2 days.  Pt is willing to have home oral DM meds increased and is willing to add another oral agent to home regimen but very reluctant to take insulin.  We talked again about the importance of good CBG control to help with wound healing and prevent further infections.  OP Pharmacy got Prior Auth accepted for the Dexcom G7 glucose sensor.  Gave pt 1 free sample sensor to take home.  Asked pt not to start the sensor until he goes home b/c he will need the receiver to use with the sensor as his phone does not support the Dexcom G7 app.  Pt stated understanding.  Explained how CGM differs from Fingerstick CBG.  Explained that Dexcom sensors last 10 days and he will need to rotate arms with each  sensor placement.  Pt educated to get sensor refills from PCP.  Also educated pt to always check fingerstick CBG when symptoms don't match sensor reading.  Reviewed with pt verbally how to place sensor and asked him to watch the educational videos on the [facility] website.  Pt agreeable and appreciative.     --Will follow patient during hospitalization--  Ambrose Finland RN, MSN, CDCES Diabetes Coordinator Inpatient Glycemic Control Team Team Pager: [phone] (8a-5p)

## 2023-07-13 NOTE — Progress Notes (Signed)
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 Abx changed to IV unasyn, vanco as per ID. S/p angiogram which was WNL. Podiatry following and recs apprec . ID consulted    Sepsis ruled out: did not meet criteria   DM2: HbA1c 8.6, poorly controlled. Continue on SSI w/ accuchecks.    Morbid obesity: BMI 36.9. Co-morbid conditions of DM2, HTN, OSA. Complicates overall care & prognosis   HTN: pt is no longer taking telmisartan    OSA: does not use CPAP currently    Hyponatremia: WNL today        DVT prophylaxis: lovenox Code Status: full  Family Communication:  Disposition Plan: depends on PT/OT recs (not consulted yet)   Level of care: Med-Surg Status is: Inpatient Remains inpatient appropriate because: severity of illness, on IV abxs     Consultants:  Vasc surg Podiatry   Procedures:   Antimicrobials: unasyn, vanco    Subjective: Pt c/o frustration   Objective: Vitals:   07/12/23 1720 07/12/23 2010 07/13/23 0412 07/13/23 0828  BP: 122/69 120/72 121/63 135/73  Pulse: 76 91 80 95  Resp: 16 18 16 16   Temp: 97.8 F (36.6 C) 97.7 F (36.5 C) 98.3 F (36.8 C) 98.1 F (36.7 C)  TempSrc: Oral  Oral   SpO2: 96%  100% 97% 96%  Weight:      Height:       No intake or output data in the 24 hours ending 07/13/23 0834  Filed Weights   07/10/23 2142  Weight: 100.7 kg    Examination:  General exam: Appears frustrated  Respiratory system: clear breath sound b/l  Cardiovascular system: S1/S2+. No rubs or clicks  Gastrointestinal system: abd is soft, NT, obese & normal bowel sounds  Central nervous system: alert & oriented. Moves all extremities  Psychiatry: Judgement and insight appears at baseline. Frustrated mood and affect     Data Reviewed: I have personally reviewed following labs and imaging studies  CBC: Recent Labs  Lab 07/10/23 2144 07/11/23 0704 07/13/23 0520  WBC 9.3 8.5 8.2  NEUTROABS 6.4  --   --   HGB 15.1 13.2 13.5  HCT 45.3 39.7 41.2  MCV 90.4 88.6 90.5  PLT 249 216 264   Basic Metabolic Panel: Recent Labs  Lab 07/10/23 2144 07/11/23 0704 07/12/23 0512 07/13/23 0520  NA 132* 129* 134* 136  K 4.6 3.5 3.9 4.6  CL 103 101 102 103  CO2 22 20* 22 24  GLUCOSE 480* 339* 273* 167*  BUN 15 10 13 13   CREATININE 0.91 0.74 0.74 0.80  CALCIUM 8.2* 7.8* 7.9* 8.2*  MG  --   --  2.2 2.6*   GFR: Estimated Creatinine Clearance: 109.9 mL/min (by C-G formula based on  SCr of 0.8 mg/dL). Liver Function Tests: No results for input(s): "AST", "ALT", "ALKPHOS", "BILITOT", "PROT", "ALBUMIN" in the last 168 hours. No results for input(s): "LIPASE", "AMYLASE" in the last 168 hours. No results for input(s): "AMMONIA" in the last 168 hours. Coagulation Profile: No results for input(s): "INR", "PROTIME" in the last 168 hours. Cardiac Enzymes: No results for input(s): "CKTOTAL", "CKMB", "CKMBINDEX", "TROPONINI" in the last 168 hours. BNP (last 3 results) No results for input(s): "PROBNP" in the last 8760 hours. HbA1C: Recent Labs    07/11/23 0704  HGBA1C 8.6*   CBG: Recent Labs  Lab 07/12/23 0827 07/12/23 1117 07/12/23 1727 07/12/23 2104 07/13/23 0829  GLUCAP 243* 228*  143* 220* 257*   Lipid Profile: No results for input(s): "CHOL", "HDL", "LDLCALC", "TRIG", "CHOLHDL", "LDLDIRECT" in the last 72 hours. Thyroid Function Tests: No results for input(s): "TSH", "T4TOTAL", "FREET4", "T3FREE", "THYROIDAB" in the last 72 hours. Anemia Panel: No results for input(s): "VITAMINB12", "FOLATE", "FERRITIN", "TIBC", "IRON", "RETICCTPCT" in the last 72 hours. Sepsis Labs: Recent Labs  Lab 07/10/23 2144 07/11/23 0018 07/11/23 0256  LATICACIDVEN 2.1* 2.6* 2.2*    Recent Results (from the past 240 hour(s))  Blood Culture (routine x 2)     Status: None (Preliminary result)   Collection Time: 07/11/23 12:18 AM   Specimen: BLOOD  Result Value Ref Range Status   Specimen Description BLOOD RIGHT HAND  Final   Special Requests   Final    BOTTLES DRAWN AEROBIC AND ANAEROBIC Blood Culture results may not be optimal due to an inadequate volume of blood received in culture bottles   Culture   Final    NO GROWTH 2 DAYS Performed at [facility], [address], [place], [place] [address]    Report Status PENDING  Incomplete  Blood Culture (routine x 2)     Status: None (Preliminary result)   Collection Time: 07/11/23 12:18 AM   Specimen: BLOOD  Result Value Ref Range Status   Specimen Description BLOOD RIGHT ANTECUBITAL  Final   Special Requests   Final    BOTTLES DRAWN AEROBIC AND ANAEROBIC Blood Culture results may not be optimal due to an excessive volume of blood received in culture bottles   Culture   Final    NO GROWTH 2 DAYS Performed at [facility], [address] [place], [place] [address]    Report Status PENDING  Incomplete  MRSA Next Gen by PCR, Nasal     Status: None   Collection Time: 07/11/23  5:30 AM   Specimen: Nasal Mucosa; Nasal Swab  Result Value Ref Range Status   MRSA by PCR Next Gen NOT DETECTED NOT DETECTED Final    Comment: (NOTE) The GeneXpert MRSA Assay (FDA approved for NASAL specimens only), is one component of  a comprehensive MRSA colonization surveillance program. It is not intended to diagnose MRSA infection nor to guide or monitor treatment for MRSA infections. Test performance is not FDA approved in patients less than 31 years old. Performed at [facility], [address], [place], [place] [address]          Radiology Studies: PERIPHERAL VASCULAR CATHETERIZATION  Result Date: 07/12/2023 See surgical note for result.  MR FOOT RIGHT W WO CONTRAST  Result Date: 07/11/2023 CLINICAL DATA:  Right foot infection.  History of amputation. EXAM: MRI OF THE RIGHT FOREFOOT WITHOUT AND WITH CONTRAST TECHNIQUE: Multiplanar, multisequence MR imaging of the right foot was performed before and after the administration of intravenous contrast.  CONTRAST:  9mL GADAVIST GADOBUTROL 1 MMOL/ML IV SOLN COMPARISON:  Right foot x-rays from same day. MRI right foot dated December 18, 2022. FINDINGS: Bones/Joint/Cartilage Prior transmetatarsal amputation. New intense marrow edema with associated enhancement and corresponding decreased T1 marrow signal involving the medial navicular, the entire medial and middle cuneiforms, the inferior aspect of the lateral cuneiform, and the residual second and third metatarsal bases. No fracture or joint effusion. Ligaments Medial and lateral ankle ligaments are grossly intact. Muscles and Tendons No tenosynovitis. Soft tissue Plantar ulceration of the medial aspect of the foot at the level of the medial cuneiform, extending to bone (series 7, image 27). Prominent surrounding soft tissue swelling with enhancement. No fluid collection. No soft tissue mass. IMPRESSION: 1. Plantar ulceration of the medial aspect of the foot at the level of the medial cuneiform, extending to bone. Acute osteomyelitis of the medial navicular, the cuneiforms, and the residual second and third metatarsal bases. No abscess. Electronically Signed   By: Obie Dredge M.D.   On: 07/11/2023 16:23         Scheduled Meds:  aspirin EC  81 mg Oral Daily   atorvastatin  20 mg Oral Daily   enoxaparin (LOVENOX) injection  50 mg Subcutaneous Q24H   influenza vac split trivalent PF  0.5 mL Intramuscular Tomorrow-1000   insulin aspart  0-20 Units Subcutaneous TID WC   insulin aspart  0-5 Units Subcutaneous QHS   tamsulosin  0.4 mg Oral Daily   Continuous Infusions:  ceFEPime (MAXIPIME) IV 2 g (07/13/23 0533)   vancomycin 1,250 mg (07/13/23 0229)     LOS: 2 days      Charise Killian, MD [facility] Pager [phone]  If 7PM-7AM, please contact night-coverage [email or web address] 07/13/2023, 8:34 AM

## 2023-07-13 NOTE — Plan of Care (Signed)

## 2023-07-13 NOTE — Progress Notes (Signed)
Pharmacy Antibiotic Note  [name] is a 57 y.o. male admitted on 07/10/2023 with osteomyelitis.  Pharmacy has been consulted for Cefepime & Vancomycin dosing.  Pt with h/o of allergic rxn to Vancomycin causing itching.  Increasing infusion time of  doses to reduce chance of possible Redman Syndrome reaction/symptoms.    Plan: Cefepime 2 gm q8hr per indication & renal fxn.  Vancomycin 1000 mg IV Q 12 hrs. Goal AUC 400-550. Expected AUC: 515 Expected Cmin: 14.1 SCr used: 0.8, Vd used: 0.5, BMI: 36.9  Pharmacy will continue to follow and will adjust abx dosing whenever warranted.  Temp (24hrs), Avg:98 F (36.7 C), Min:97.7 F (36.5 C), Max:98.3 F (36.8 C)   Recent Labs  Lab 07/10/23 2144 07/11/23 0018 07/11/23 0256 07/11/23 0704 07/12/23 0512 07/13/23 0520  WBC 9.3  --   --  8.5  --  8.2  CREATININE 0.91  --   --  0.74 0.74 0.80  LATICACIDVEN 2.1* 2.6* 2.2*  --   --   --     Estimated Creatinine Clearance: 109.9 mL/min (by C-G formula based on SCr of 0.8 mg/dL).    Allergies  Allergen Reactions   Vancomycin Itching    Other reaction(s): Other (See Comments) Red mans    Antimicrobials this admission: 11/12 Cefepime >>  11/12 Vancomycin >>   Microbiology results: 11/12 BCx: NGTD  Thank you for allowing pharmacy to be a part of this patient's care.  Bettey Costa, PharmD Clinical Pharmacist 07/13/2023 2:04 PM

## 2023-07-14 ENCOUNTER — Other Ambulatory Visit (HOSPITAL_COMMUNITY): Payer: Self-pay

## 2023-07-14 DIAGNOSIS — Z794 Long term (current) use of insulin: Secondary | ICD-10-CM

## 2023-07-14 DIAGNOSIS — E11621 Type 2 diabetes mellitus with foot ulcer: Secondary | ICD-10-CM

## 2023-07-14 DIAGNOSIS — M86371 Chronic multifocal osteomyelitis, right ankle and foot: Secondary | ICD-10-CM | POA: Diagnosis not present

## 2023-07-14 DIAGNOSIS — M86071 Acute hematogenous osteomyelitis, right ankle and foot: Secondary | ICD-10-CM | POA: Diagnosis not present

## 2023-07-14 LAB — BASIC METABOLIC PANEL
Anion gap: 9 (ref 5–15)
BUN: 12 mg/dL (ref 6–20)
CO2: 22 mmol/L (ref 22–32)
Calcium: 8.2 mg/dL — ABNORMAL LOW (ref 8.9–10.3)
Chloride: 105 mmol/L (ref 98–111)
Creatinine, Ser: 0.76 mg/dL (ref 0.61–1.24)
GFR, Estimated: >60 mL/min (ref >60)
Glucose, Bld: 259 mg/dL — ABNORMAL HIGH (ref 70–99)
Potassium: 4 mmol/L (ref 3.5–5.1)
Sodium: 136 mmol/L (ref 135–145)

## 2023-07-14 LAB — CBC
HCT: 40.7 % (ref 39.0–52.0)
Hemoglobin: 13.4 g/dL (ref 13.0–17.0)
MCH: 29.6 pg (ref 26.0–34.0)
MCHC: 32.9 g/dL (ref 30.0–36.0)
MCV: 90 fL (ref 80.0–100.0)
Platelets: 259 10*3/uL (ref 150–400)
RBC: 4.52 MIL/uL (ref 4.22–5.81)
RDW: 14.5 % (ref 11.5–15.5)
WBC: 7.1 10*3/uL (ref 4.0–10.5)
nRBC: 0 % (ref 0.0–0.2)

## 2023-07-14 LAB — GLUCOSE, CAPILLARY
Glucose-Capillary: 171 mg/dL — ABNORMAL HIGH (ref 70–99)
Glucose-Capillary: 314 mg/dL — ABNORMAL HIGH (ref 70–99)
Glucose-Capillary: 261 mg/dL — ABNORMAL HIGH (ref 70–99)
Glucose-Capillary: 227 mg/dL — ABNORMAL HIGH (ref 70–99)
Glucose-Capillary: 217 mg/dL — ABNORMAL HIGH (ref 70–99)

## 2023-07-14 LAB — MAGNESIUM: Magnesium: 2.3 mg/dL (ref 1.7–2.4)

## 2023-07-14 NOTE — TOC Benefit Eligibility Note (Signed)
Patient Product/process development scientist completed.    The patient is insured through [employer] [facility]. Patient has [employer], may use a copay card, and/or apply for patient assistance if available.    Ran test claim for amoxicillin-clavulanate (Augmentin) 875-125 mg and the current 30 day co-pay is $5.00.  Ran test claim for Doxycyline 100 mg tablets and the current 30 day co-pay is $5.00.  This test claim was processed through [facility]- copay amounts may vary at other pharmacies due to pharmacy/plan contracts, or as the patient moves through the different stages of their insurance plan.     Roland Earl, CPHT Pharmacy Technician III Certified Patient Advocate [facility] Patient Advocate Team Direct Number: [phone]  Fax: [phone]

## 2023-07-14 NOTE — Progress Notes (Addendum)
Pharmacy Antibiotic Note  [name] is a 57 y.o. male admitted on 07/10/2023 with osteomyelitis of foot. Patient with chronic foot infection and PMH of right TMA. Pharmacy has been consulted for Vancomycin dosing.  Pt with h/o of allergic rxn to Vancomycin causing itching.  Increasing infusion time of  doses to reduce chance of possible Redman Syndrome reaction/symptoms.    Today, 07/14/2023 Day #4 vancomycin, cefepime to ampicllin/sulbactam Renal: Scr 0.76 - stable WBC remains WNL Afebrile Blood cultures unrevealing No current plans for BKA this admit  Saw patient with ID and plan to discharge on doxycyline and amoxicillin/clavulanate Pt told to avoid taking doxycyline with dairy, calcium, iron, multivitamin, etc.  Also told him may split the amoxicillin/clav tablet in half as has difficulty swallowing large pills  Plan: Vancomycin 1000 mg IV Q 12 hrs. Goal AUC 400-550. Expected AUC: 515 Expected Cmin: 14.1 SCr used: 0.8, Vd used: 0.5, BMI: 36.9  Anticipate d/c home within next 24-48h - do not foresee need to check vancomycin trough  Pharmacy will continue to follow and will adjust abx dosing whenever warranted.  Temp (24hrs), Avg:97.8 F (36.6 C), Min:97.6 F (36.4 C), Max:98 F (36.7 C)   Recent Labs  Lab 07/10/23 2144 07/11/23 0018 07/11/23 0256 07/11/23 0704 07/12/23 0512 07/13/23 0520 07/14/23 0327  WBC 9.3  --   --  8.5  --  8.2 7.1  CREATININE 0.91  --   --  0.74 0.74 0.80 0.76  LATICACIDVEN 2.1* 2.6* 2.2*  --   --   --   --     Estimated Creatinine Clearance: 109.9 mL/min (by C-G formula based on SCr of 0.76 mg/dL).    Allergies  Allergen Reactions   Vancomycin Itching    Other reaction(s): Other (See Comments) Red mans    Antimicrobials this admission: 11/12 Cefepime >> 11/14 11/12 Vancomycin >>  11/14 amp/sulb >>  Microbiology results: 11/12 BCx: NGTD  Thank you for allowing pharmacy to be a part of this patient's care.  Juliette Alcide,  PharmD, BCPS, BCIDP Work Cell: [phone] 07/14/2023 11:49 AM

## 2023-07-14 NOTE — Progress Notes (Signed)
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 Continue on IV unasyn, vanco as per ID. S/p angiogram which was WNL. Pt does not want an amputation at this time. Will likely d/c home w/ doxy & augmentin x 6 week to f/u ID & podiatry. Podiatry following and recs apprec. Vas surg signed off b/c pt did not want an amputation at this time. ID following and recs apprec    Sepsis ruled out: did not meet criteria   DM2: HbA1c 8.6, poorly controlled. Continue on SSI w/ accuchecks    Morbid obesity: BMI 36.9. Co-morbid conditions of DM2, HTN, OSA. Complicates overall care & prognosis   HTN: pt is no longer taking telmisartan as per med rec    OSA: does not use CPAP currently    Hyponatremia: resolved        DVT prophylaxis: lovenox Code Status: full  Family Communication:  Disposition Plan: likely d/c back home   Level of care: Med-Surg Status is: Inpatient Remains inpatient appropriate because: severity of illness, on IV abxs     Consultants:  Vasc surg Podiatry   Procedures:   Antimicrobials: unasyn, vanco    Subjective: Pt c/o fatigue   Objective: Vitals:   07/13/23 2009 07/13/23 2020 07/14/23 0404 07/14/23 0805  BP:  124/66  117/69 127/68  Pulse: 83  75 76  Resp: (!) 26 18 16 17   Temp: 97.6 F (36.4 C)  97.8 F (36.6 C) 97.7 F (36.5 C)  TempSrc: Oral  Oral   SpO2: 96%  98% 98%  Weight:      Height:        Intake/Output Summary (Last 24 hours) at 07/14/2023 0833 Last data filed at 07/14/2023 0331 Gross per 24 hour  Intake 840 ml  Output --  Net 840 ml    Filed Weights   07/10/23 2142  Weight: 100.7 kg    Examination:  General exam: appears comfortable  Respiratory system: clear breath sounds b/l  Cardiovascular system: S1 & S2+. No rubs or clicks   Gastrointestinal system: abd is soft, NT, obese & normal bowel sounds  Central nervous system: alert & oriented. Moves all extremities  Psychiatry: judgement and insight appears at baseline. Flat mood and affect      Data Reviewed: I have personally reviewed following labs and imaging studies  CBC: Recent Labs  Lab 07/10/23 2144 07/11/23 0704 07/13/23 0520 07/14/23 0327  WBC 9.3 8.5 8.2 7.1  NEUTROABS 6.4  --   --   --   HGB 15.1 13.2 13.5 13.4  HCT 45.3 39.7 41.2 40.7  MCV 90.4 88.6 90.5 90.0  PLT 249 216 264 259   Basic Metabolic Panel: Recent Labs  Lab 07/10/23 2144 07/11/23 0704 07/12/23 0512 07/13/23  0520 07/14/23 0327  NA 132* 129* 134* 136 136  K 4.6 3.5 3.9 4.6 4.0  CL 103 101 102 103 105  CO2 22 20* 22 24 22   GLUCOSE 480* 339* 273* 167* 259*  BUN 15 10 13 13 12   CREATININE 0.91 0.74 0.74 0.80 0.76  CALCIUM 8.2* 7.8* 7.9* 8.2* 8.2*  MG  --   --  2.2 2.6* 2.3   GFR: Estimated Creatinine Clearance: 109.9 mL/min (by C-G formula based on SCr of 0.76 mg/dL). Liver Function Tests: No results for input(s): "AST", "ALT", "ALKPHOS", "BILITOT", "PROT", "ALBUMIN" in the last 168 hours. No results for input(s): "LIPASE", "AMYLASE" in the last 168 hours. No results for input(s): "AMMONIA" in the last 168 hours. Coagulation Profile: No results for input(s): "INR", "PROTIME" in the last 168 hours. Cardiac  Enzymes: No results for input(s): "CKTOTAL", "CKMB", "CKMBINDEX", "TROPONINI" in the last 168 hours. BNP (last 3 results) No results for input(s): "PROBNP" in the last 8760 hours. HbA1C: No results for input(s): "HGBA1C" in the last 72 hours.  CBG: Recent Labs  Lab 07/13/23 0829 07/13/23 1106 07/13/23 1639 07/13/23 2104 07/14/23 0807  GLUCAP 257* 276* 212* 256* 314*   Lipid Profile: No results for input(s): "CHOL", "HDL", "LDLCALC", "TRIG", "CHOLHDL", "LDLDIRECT" in the last 72 hours. Thyroid Function Tests: No results for input(s): "TSH", "T4TOTAL", "FREET4", "T3FREE", "THYROIDAB" in the last 72 hours. Anemia Panel: No results for input(s): "VITAMINB12", "FOLATE", "FERRITIN", "TIBC", "IRON", "RETICCTPCT" in the last 72 hours. Sepsis Labs: Recent Labs  Lab 07/10/23 2144 07/11/23 0018 07/11/23 0256  LATICACIDVEN 2.1* 2.6* 2.2*    Recent Results (from the past 240 hour(s))  Blood Culture (routine x 2)     Status: None (Preliminary result)   Collection Time: 07/11/23 12:18 AM   Specimen: BLOOD  Result Value Ref Range Status   Specimen Description BLOOD RIGHT HAND  Final   Special Requests   Final    BOTTLES DRAWN AEROBIC AND ANAEROBIC Blood Culture results may not be optimal due to an inadequate volume of blood received in culture bottles   Culture   Final    NO GROWTH 3 DAYS Performed at [facility], [address], [place], [place] [address]    Report Status PENDING  Incomplete  Blood Culture (routine x 2)     Status: None (Preliminary result)   Collection Time: 07/11/23 12:18 AM   Specimen: BLOOD  Result Value Ref Range Status   Specimen Description BLOOD RIGHT ANTECUBITAL  Final   Special Requests   Final    BOTTLES DRAWN AEROBIC AND ANAEROBIC Blood Culture results may not be optimal due to an excessive volume of blood received in culture bottles   Culture   Final    NO GROWTH 3 DAYS Performed at [facility], [address]  [place], [place] [address]    Report Status PENDING  Incomplete  MRSA Next Gen by PCR, Nasal     Status: None   Collection Time: 07/11/23  5:30 AM   Specimen: Nasal Mucosa; Nasal Swab  Result Value Ref Range Status   MRSA by PCR Next Gen NOT DETECTED NOT DETECTED Final    Comment: (NOTE) The GeneXpert MRSA Assay (FDA approved for NASAL specimens only), is one component of a comprehensive MRSA colonization surveillance program. It is not intended to diagnose MRSA infection nor to guide or monitor treatment for MRSA infections. Test performance is not FDA approved in patients less than 25 years old. Performed at [facility], [address] [place], [place] [address]          Radiology Studies: PERIPHERAL VASCULAR CATHETERIZATION  Result Date: 07/12/2023 See surgical note for result.       Scheduled Meds:  aspirin EC  81 mg Oral Daily   atorvastatin  20 mg Oral Daily   enoxaparin (LOVENOX) injection  50 mg Subcutaneous Q24H   influenza vac split trivalent PF  0.5 mL Intramuscular Tomorrow-1000   insulin aspart  0-20 Units Subcutaneous TID WC   insulin aspart  0-5 Units Subcutaneous QHS   tamsulosin  0.4 mg Oral Daily   Continuous Infusions:  ampicillin-sulbactam (UNASYN) IV Stopped (07/14/23 5956)   vancomycin Stopped (07/14/23 0331)     LOS: 3 days      Charise Killian, MD [facility] Pager [phone]  If 7PM-7AM, please contact night-coverage [email or web address] 07/14/2023, 8:33 AM

## 2023-07-14 NOTE — Consult Note (Signed)
Date of Admission:  07/10/2023          Reason for Consult: Diabetic foot ulcer with osteomyelitis involving posterior heel bones including cuneiform and navicular bone   Referring Provider: Fabienne Bruns, MD   Assessment:  DFU with osteomyelitis of forearm and navicular bone and posterior heel Insulin-dependent diabetes mellitus Diabetic neuropathy Hypertension Hyperlipidemia Financial l stressors   Plan:  Fine to continue vancomycin and Unasyn while in house When he is ready for DC would dc on oral doxycycline and augmentin with 6 week supply He has an appt scheduled with Dr. Rivka Safer on 08/17/2023 at 915AM here at [facility]  My partner Dr. Renold Don is available for questions this weekend and will be rounding at [facility] next week if the patient is still here and needs to be seen by [place] w ID.  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)   Diabetic ulcer of foot associated with diabetes mellitus due to underlying condition, with fat layer exposed (HCC)   S/P transmetatarsal amputation of foot, right (HCC)   Cellulitis of right foot   Uncontrolled type 2 diabetes mellitus with hyperglycemia, without long-term current use of insulin (HCC)   Lactic acidosis   HTN (hypertension)   Obesity, Class III, BMI 40-49.9 (morbid obesity) (HCC)   Scheduled Meds:  aspirin EC  81 mg Oral Daily   atorvastatin  20 mg Oral Daily   enoxaparin (LOVENOX) injection  50 mg Subcutaneous Q24H   influenza vac split trivalent PF  0.5 mL Intramuscular Tomorrow-1000   insulin aspart  0-20 Units Subcutaneous TID WC   insulin aspart  0-5 Units Subcutaneous QHS   tamsulosin  0.4 mg Oral Daily   Continuous Infusions:  ampicillin-sulbactam (UNASYN) IV Stopped (07/14/23 2440)   vancomycin Stopped (07/14/23 0331)   PRN Meds:.acetaminophen **OR** acetaminophen, alum & mag  hydroxide-simeth, ondansetron **OR** ondansetron (ZOFRAN) IV, oxyCODONE  HPI: [name] is a 57 y.o. male with diabetes mellitus hyperlipidemia vascular disease neuropathy with diabetic foot ulcers prior amputation on the left toe, status post right transmetatarsal amputation who has been struggling with a chronic ulcer in his midfoot for nearly 12 years.  He had been following with podiatry at the wound care clinic at [facility] but was not able to afford the facility fees and stopped seeing them.  He was last seen by them after he been hospitalized here at [facility] in April when he had progression of infection and wound cultures had grown a Klebsiella and and Enterococcus faecalis.  He was through 2 weeks of Augmentin and Bactrim after that.  He did not have wound care after that.  He then presented to the ER on November 11 after he developed chills and malaise 2 days prior and then developed increasing bloody drainage from his wound as well as swelling of his foot.  Blood cultures were taken and he was initiated on vancomycin and cefepime.  His blood cultures remain sterile.  He has had plain films and an MRI of the foot.  MRI of the foot shows  IMPRESSION: 1. Plantar ulceration of the medial aspect of the foot at the level of the medial cuneiform, extending to bone. Acute osteomyelitis of the medial navicular, the cuneiforms, and the residual second and third metatarsal bases. No abscess.   Been seen by Dr. Ether Griffins with  podiatry and also by Dr.Dew with [facility]. He has had a gram that shows good blood flow.  He has been told by both podiatry and [facility] that his foot, limb is not salvageable and that he needs a BKA for cure of this infection.  Note I simplified his antibiotics to Unasyn and vancomycin yesterday.  He currently though is having some full obstacles that are standing in the way of his being ready to undergo below the knee amputation.  In addition to the  being emotionally prepared for it he has some financial stressors.  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  This would then reduce its ability to work and then put his livelihood at risk.  At time being he would like to proceed with some oral antibiotics from this admission and then consider either chronic oral antibiotics or having oral antibiotics on hand to use in a reactive fashion when he develops signs and symptoms of infection.  He says that he knows very clearly when he is developing worsening infection in his foot as he has antecedent chills in the days prior to the foot wound worsening.  Grown MRSA several times that was tetracycline sensitive on recent cultures done at [facility].  Here again he grew Klebsiella and Enterococcus faecalis that were fairly sensitive organisms.  I think a regimen of doxycycline and Augmentin with a 6-week supply and close followup with Dr. Rivka Safer and someone such as Podiatry or [facility] is critical.  I have personally spent 85 minutes involved in face-to-face and non-face-to-face activities for this patient on the day of the visit. Professional time spent includes the following activities: Preparing to see the patient (review of tests), Obtaining and/or reviewing separately obtained history (admission/discharge record), Performing a medically appropriate examination and/or evaluation , Ordering medications/tests/procedures, referring and communicating with other health care professionals, Documenting clinical information in the EMR, Independently interpreting results (not separately reported), Communicating results to the patient/family/caregiver, Counseling and educating the patient/family/caregiver and Care coordination (not separately  reported).   Review of Systems: Review of Systems  Constitutional:  Negative for chills, diaphoresis, fever, malaise/fatigue and weight loss.  HENT:  Negative for congestion, hearing loss, sore throat and tinnitus.   Eyes:  Negative for blurred vision and double vision.  Respiratory:  Negative for cough, sputum production, shortness of breath and wheezing.   Cardiovascular:  Negative for chest pain, palpitations and leg swelling.  Gastrointestinal:  Negative for abdominal pain, blood in stool, constipation, diarrhea, heartburn, melena, nausea and vomiting.  Genitourinary:  Negative for dysuria, flank pain and hematuria.  Musculoskeletal:  Positive for joint pain. Negative for back pain, falls and myalgias.  Skin:  Negative for itching and rash.  Neurological:  Negative for dizziness, sensory change, focal weakness, loss of consciousness, weakness and headaches.  Endo/Heme/Allergies:  Does not bruise/bleed easily.  Psychiatric/Behavioral:  Negative for depression, memory loss and suicidal ideas. The patient is not nervous/anxious.     Past Medical History:  Diagnosis Date   Acute prostatitis without hematuria 05/20/2022   Allergy    Atypical pneumonia 12/18/2022   Complication of anesthesia    pt states at [facility] he stopped breathing due to his sleep apnea-hard to wake up   COVID-19    Depression  Diabetic ulcer of both feet (HCC)    DM (diabetes mellitus), type 2 (HCC)    History of kidney stones    History of methicillin resistant staphylococcus aureus (MRSA) 09/2020   foot   Neuromuscular disorder (HCC)    neuropathy of back and bilateral feet   Obesity    Sleep apnea    does not use cpap    Social History   Tobacco Use   Smoking status: Never    Passive exposure: Never   Smokeless tobacco: Never  Vaping Use   Vaping status: Never Used  Substance Use Topics   Alcohol use: Never   Drug use: Never    Family History  Problem Relation Age of Onset   Heart disease  Father    Stroke Father    Heart attack Father 60   Heart disease Maternal Grandmother    Stroke Maternal Grandmother    Heart disease Paternal Grandfather    Stroke Paternal Grandfather    Allergies  Allergen Reactions   Vancomycin Itching    Other reaction(s): Other (See Comments) Red mans    OBJECTIVE: Blood pressure 127/68, pulse 76, temperature 97.7 F (36.5 C), resp. rate 17, height 5\' 5"  (1.651 m), weight 100.7 kg, SpO2 98%.  Physical Exam Constitutional:      Appearance: He is well-developed.  HENT:     Head: Normocephalic and atraumatic.  Eyes:     Conjunctiva/sclera: Conjunctivae normal.  Cardiovascular:     Rate and Rhythm: Normal rate and regular rhythm.  Pulmonary:     Effort: Pulmonary effort is normal. No respiratory distress.     Breath sounds: No wheezing.  Abdominal:     General: There is no distension.     Palpations: Abdomen is soft.  Musculoskeletal:        General: Tenderness present.     Cervical back: Normal range of motion and neck supple.  Skin:    General: Skin is warm and dry.     Coloration: Skin is not pale.     Findings: No erythema or rash.  Neurological:     General: No focal deficit present.     Mental Status: He is alert and oriented to person, place, and time.  Psychiatric:        Mood and Affect: Mood normal.        Behavior: Behavior normal.        Thought Content: Thought content normal.        Judgment: Judgment normal.    Foot 07/14/2023:    Lab Results Lab Results  Component Value Date   WBC 7.1 07/14/2023   HGB 13.4 07/14/2023   HCT 40.7 07/14/2023   MCV 90.0 07/14/2023   PLT 259 07/14/2023    Lab Results  Component Value Date   CREATININE 0.76 07/14/2023   BUN 12 07/14/2023   NA 136 07/14/2023   K 4.0 07/14/2023   CL 105 07/14/2023   CO2 22 07/14/2023    Lab Results  Component Value Date   ALT 36 12/18/2022   AST 41 12/18/2022   ALKPHOS 50 12/18/2022   BILITOT 1.8 (H) 12/18/2022      Microbiology: Recent Results (from the past 240 hour(s))  Blood Culture (routine x 2)     Status: None (Preliminary result)   Collection Time: 07/11/23 12:18 AM   Specimen: BLOOD  Result Value Ref Range Status   Specimen Description BLOOD RIGHT HAND  Final   Special Requests   Final  BOTTLES DRAWN AEROBIC AND ANAEROBIC Blood Culture results may not be optimal due to an inadequate volume of blood received in culture bottles   Culture   Final    NO GROWTH 3 DAYS Performed at [facility], [address] [place], [place] [address]    Report Status PENDING  Incomplete  Blood Culture (routine x 2)     Status: None (Preliminary result)   Collection Time: 07/11/23 12:18 AM   Specimen: BLOOD  Result Value Ref Range Status   Specimen Description BLOOD RIGHT ANTECUBITAL  Final   Special Requests   Final    BOTTLES DRAWN AEROBIC AND ANAEROBIC Blood Culture results may not be optimal due to an excessive volume of blood received in culture bottles   Culture   Final    NO GROWTH 3 DAYS Performed at [facility], [address], [place], [place] [address]    Report Status PENDING  Incomplete  MRSA Next Gen by PCR, Nasal     Status: None   Collection Time: 07/11/23  5:30 AM   Specimen: Nasal Mucosa; Nasal Swab  Result Value Ref Range Status   MRSA by PCR Next Gen NOT DETECTED NOT DETECTED Final    Comment: (NOTE) The GeneXpert MRSA Assay (FDA approved for NASAL specimens only), is one component of a comprehensive MRSA colonization surveillance program. It is not intended to diagnose MRSA infection nor to guide or monitor treatment for MRSA infections. Test performance is not FDA approved in patients less than 70 years old. Performed at [facility], [address] [place], [place] [address]     Acey Lav, MD [facility] [phone] pager  07/14/2023, 11:03 AM

## 2023-07-14 NOTE — Plan of Care (Signed)
  Problem: Education: Goal: Ability to describe self-care measures that may prevent or decrease complications (Diabetes Survival Skills Education) will improve Outcome: Progressing Goal: Individualized Educational Video(s) Outcome: Progressing   Problem: Coping: Goal: Ability to adjust to condition or change in health will improve Outcome: Progressing   Problem: Health Behavior/Discharge Planning: Goal: Ability to identify and utilize available resources and services will improve Outcome: Progressing Goal: Ability to manage health-related needs will improve Outcome: Progressing   Problem: Metabolic: Goal: Ability to maintain appropriate glucose levels will improve Outcome: Progressing   Problem: Nutritional: Goal: Maintenance of adequate nutrition will improve Outcome: Progressing Goal: Progress toward achieving an optimal weight will improve Outcome: Progressing   Problem: Skin Integrity: Goal: Risk for impaired skin integrity will decrease Outcome: Progressing   Problem: Tissue Perfusion: Goal: Adequacy of tissue perfusion will improve Outcome: Progressing   Problem: Education: Goal: Knowledge of General Education information will improve Description: Including pain rating scale, medication(s)/side effects and non-pharmacologic comfort measures Outcome: Progressing   Problem: Health Behavior/Discharge Planning: Goal: Ability to manage health-related needs will improve Outcome: Progressing   Problem: Clinical Measurements: Goal: Ability to maintain clinical measurements within normal limits will improve Outcome: Progressing Goal: Will remain free from infection Outcome: Progressing Goal: Diagnostic test results will improve Outcome: Progressing Goal: Respiratory complications will improve Outcome: Progressing Goal: Cardiovascular complication will be avoided Outcome: Progressing   Problem: Activity: Goal: Risk for activity intolerance will decrease Outcome:  Progressing   Problem: Nutrition: Goal: Adequate nutrition will be maintained Outcome: Progressing   Problem: Coping: Goal: Level of anxiety will decrease Outcome: Progressing   Problem: Elimination: Goal: Will not experience complications related to bowel motility Outcome: Progressing Goal: Will not experience complications related to urinary retention Outcome: Progressing   Problem: Pain Management: Goal: General experience of comfort will improve Outcome: Progressing   Problem: Safety: Goal: Ability to remain free from injury will improve Outcome: Progressing   Problem: Skin Integrity: Goal: Risk for impaired skin integrity will decrease Outcome: Progressing

## 2023-07-14 NOTE — Inpatient Diabetes Management (Addendum)
Inpatient Diabetes Program Recommendations  AACE/ADA: New Consensus Statement on Inpatient Glycemic Control (2015)  Target Ranges:  Prepandial:   less than 140 mg/dL      Peak postprandial:   less than 180 mg/dL (1-2 hours)      Critically ill patients:  140 - 180 mg/dL    Latest Reference Range & Units 09/21/22 07:36 12/29/22 09:45 07/11/23 07:04  Hemoglobin A1C 4.8 - 5.6 % 6.3 7.0 (H) 8.6 (H)  (H): Data is abnormally high  Latest Reference Range & Units 07/14/23 08:07 07/14/23 11:21  Glucose-Capillary 70 - 99 mg/dL 161 (H) 096 (H)  (H): Data is abnormally high   Admit Cellulitis RLE/ Suspect acute on chronic osteomyelitis/ Infected diabetic foot ulcer/ Lactic acidosis, possible Sepsis   History: DM2   Home: Metformin 500 mg QAM   Current: Novolog 0-20 units TID ac/hs    MD- Pt NOT willing to start Insulin at home.  Please consider increasing the Metformin to 500 mg BID for home  Please give pt a Rx for the Dexcom G7 receiver--Order Number [id number]  Please also give pt 2 refills on the Dexcom G7 sensors- Order Number [id number]    Spoke w/ pt again today.  Discussed w/ pt the possibility of starting insulin at home.  Pt stated he does NOT want to start Insulin.  Is open to increasing the Metformin dose, however, pt gave me several excuses as to why it would be difficult for him to try to take the Metformin BID.  I again explained to pt the importance of better CBG control at home to heal his foot and get rid of the infection and pt finally agreed to "try" to take the Metformin BID.  I asked pt to please start the Dexcom glucose sensor when he goes home and I have asked Attending MD to give pt a Rx for the [facility] G7 receiver so he can get his glucose readings from the sensor (phone is not compatible with the Dexcom app).  If for some reason pt does not get the Rx for the receiver, I asked pt to message his PCP for Rx for the receiver and also for refills on the Dexcom G7 sensors.  I also  strongly encouraged pt to reach out to his PCP and let her know if he continues to have CBGs >180 at home as pt need more aggressive DM meds.       --Will follow patient during hospitalization--  Ambrose Finland RN, MSN, CDCES Diabetes Coordinator Inpatient Glycemic Control Team Team Pager: [phone] (8a-5p)

## 2023-07-15 DIAGNOSIS — M86071 Acute hematogenous osteomyelitis, right ankle and foot: Secondary | ICD-10-CM | POA: Diagnosis not present

## 2023-07-15 LAB — BASIC METABOLIC PANEL
Anion gap: 8 (ref 5–15)
BUN: 12 mg/dL (ref 6–20)
CO2: 25 mmol/L (ref 22–32)
Calcium: 8.2 mg/dL — ABNORMAL LOW (ref 8.9–10.3)
Chloride: 103 mmol/L (ref 98–111)
Creatinine, Ser: 0.67 mg/dL (ref 0.61–1.24)
GFR, Estimated: >60 mL/min (ref >60)
Glucose, Bld: 214 mg/dL — ABNORMAL HIGH (ref 70–99)
Potassium: 3.8 mmol/L (ref 3.5–5.1)
Sodium: 136 mmol/L (ref 135–145)

## 2023-07-15 LAB — GLUCOSE, CAPILLARY
Glucose-Capillary: 206 mg/dL — ABNORMAL HIGH (ref 70–99)
Glucose-Capillary: 252 mg/dL — ABNORMAL HIGH (ref 70–99)

## 2023-07-15 LAB — MAGNESIUM: Magnesium: 2.1 mg/dL (ref 1.7–2.4)

## 2023-07-15 LAB — CBC
HCT: 40.8 % (ref 39.0–52.0)
Hemoglobin: 13.8 g/dL (ref 13.0–17.0)
MCH: 29.4 pg (ref 26.0–34.0)
MCHC: 33.8 g/dL (ref 30.0–36.0)
MCV: 87 fL (ref 80.0–100.0)
Platelets: 287 10*3/uL (ref 150–400)
RBC: 4.69 MIL/uL (ref 4.22–5.81)
RDW: 14.6 % (ref 11.5–15.5)
WBC: 7.4 10*3/uL (ref 4.0–10.5)
nRBC: 0 % (ref 0.0–0.2)

## 2023-07-15 MED ORDER — AMOXICILLIN-POT CLAVULANATE 875-125 MG PO TABS: 1.0000 | ORAL_TABLET | Freq: Two times a day (BID) | ORAL | 0 refills | Status: DC

## 2023-07-15 MED ORDER — DOXYCYCLINE HYCLATE 100 MG PO TABS: 100.0000 mg | ORAL_TABLET | Freq: Two times a day (BID) | ORAL | 0 refills | Status: DC

## 2023-07-15 MED ORDER — DEXCOM G7 SENSOR MISC: 0 refills | Status: DC

## 2023-07-15 MED ORDER — DEXCOM G7 RECEIVER DEVI: 0 refills | Status: DC

## 2023-07-15 NOTE — Discharge Summary (Signed)
Physician Discharge Summary  [name] VWU:[id number] DOB: 1965/10/29 DOA: 07/10/2023  PCP: Sherlene Shams, MD  Admit date: 07/10/2023 Discharge date: 07/15/2023  Admitted From: home  Disposition:  home   Recommendations for Outpatient Follow-up:  Follow up with PCP in 1-2 weeks F/u w/ ID, Dr. Rivka Safer, in 1-2 weeks F/u w/ podiatry in 1-2 weeks   Home Health: no  Equipment/Devices:  Discharge Condition: stable  CODE STATUS: full  Diet recommendation: Heart Healthy / Carb Modified   Brief/Interim Summary: HPI was taken from Dr. Para March: [name] is a 57 y.o. male with medical history significant for non-insulin-dependent diabetes mellitus, hypertension, depression,  with chronic right plantar wound, prior right transmetatarsal amputation being admitted for possible sepsis secondary to cellulitis and likely osteomyelitis of the right foot from infection of his plantar ulcer.  Patient presented with pain swelling and redness of the right foot similar to presentation when he was hospitalized Back in April 2024 for sepsis secondary to cellulitis.  Wound cultures grew Klebsiella and Enterococcus for which she was treated with 2 weeks of Augmentin and Bactrim.  He has not followed with podiatry or wound care. ED course and data review: BP 163/90 and tachycardic to 108, afebrile Labs: CBC WNL but lactic acid 2.6.  BMP notable for glucose 480 Foot x-ray done, not yet interpreted  due to delays from radiology department Patient started on vancomycin and cefepime for cellulitis with suspicion of osteomyelitis and given a 2 L LR bolus, Percocet for pain Hospitalist consulted for admission.    Addendum: Foot xray "Multiple areas of lucency in the remaining midfoot and hindfoot, most apparent in the neck of the talus. Underlying osteomyelitis should be considered. Further evaluation with MRI of the foot with and without IV gadolinium could provide additional  diagnostic Information".  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 Continue on IV unasyn, vanco as per ID. S/p angiogram which was WNL. Pt does not want an amputation at this time. D/c w/ doxy & augmentin x 6 week to f/u ID & podiatry. Podiatry following and recs apprec. Vas surg signed off b/c pt did not want an amputation at this time. ID following and recs apprec    Sepsis ruled out: did not meet criteria   DM2: HbA1c 8.6, poorly controlled. Pt did not want to insulin at this time or increase home metformin dose at this time and wanted to talk to his PCP first    Morbid obesity: BMI 36.9. Co-morbid conditions of DM2, HTN, OSA. Complicates overall care & prognosis   HTN: pt is no longer taking telmisartan as per med rec    OSA: does not use CPAP currently    Hyponatremia: resolved   Discharge Instructions  Discharge Instructions     Diet - low sodium heart healthy   Complete by: As directed    Diet Carb Modified  Complete by: As directed    Discharge instructions   Complete by: As directed    F/u w/ PCP in 1-2 weeks. F/u w/ ID, Dr. Rivka Safer, in 1-2 weeks. F/u w/ podiatry, Dr. Excell Seltzer, in 1-2 weeks.   Discharge wound care:   Complete by: As directed    Wound care  Until discontinued      Comments: Foam dressing in the right foot until the podiatrist  assessment and further consult.   Increase activity slowly   Complete by: As directed       Allergies as of 07/15/2023       Reactions   Vancomycin Itching   Other reaction(s): Other (See Comments) Red mans        Medication List     TAKE these medications    amoxicillin-clavulanate 875-125 MG tablet Commonly known as: AUGMENTIN Take 1 tablet by mouth 2 (two) times daily.   aspirin EC 81 MG tablet Take 81 mg by mouth daily. Swallow whole.   atorvastatin 20 MG tablet Commonly known as: LIPITOR Take 1 tablet (20 mg total) by mouth daily.   Dexcom G7 Receiver Devi Check blood sugars at least 3 times a day   Dexcom G7 Sensor Misc Use 1 sensor every 10 days for 30 days. No refills.   doxycycline 100 MG tablet Commonly known as: VIBRA-TABS Take 1 tablet (100 mg total) by mouth 2 (two) times daily.   metFORMIN 500 MG 24 hr tablet Commonly known as: GLUCOPHAGE-XR TAKE 1 TABLET BY MOUTH EVERY DAY WITH BREAKFAST   multivitamin with minerals tablet Take 1 tablet by mouth daily.   oxyCODONE 5 MG immediate release tablet Commonly known as: Roxicodone Take 1.5 tablets (7.5 mg total) by mouth daily. As needed for severe pain   Sodium Fluoride 5000 Sensitive 1.1-5 % Gel Generic drug: Sod Fluoride-Potassium Nitrate Brush as directed by your provider.   tamsulosin 0.4 MG Caps capsule Commonly known as: FLOMAX Take 0.4 mg by mouth daily.   telmisartan 20 MG tablet Commonly known as: MICARDIS Take 1 tablet (20 mg total) by mouth at bedtime.               Discharge Care Instructions  (From admission, onward)           Start     Ordered   07/15/23 0000  Discharge wound care:       Comments: Wound care  Until discontinued      Comments: Foam dressing in the right foot until the podiatrist assessment and further consult.   07/15/23 1249            Follow-up Information     Sherlene Shams, MD Follow up.   Specialty: Internal Medicine Why:  Hospital follow up Contact information: [address] [place] [address] [phone]         Gwyneth Revels, DPM. Schedule an appointment as soon as possible for a visit in 2 week(s).   Specialty: Podiatry Why: For wound re-check Contact information: [address] [place] [address] [phone]         Lynn Ito, MD Follow up.   Specialty: Infectious Diseases Why: F/u in 1-2 weeks Contact information: [address] [place] [address] [phone]                Allergies  Allergen Reactions   Vancomycin Itching    Other reaction(s): Other (See Comments) Red mans    Consultations: ID  Podiatry  Vasc surg    Procedures/Studies: PERIPHERAL VASCULAR CATHETERIZATION  Result Date: 07/12/2023 See surgical note for result.  MR FOOT RIGHT W WO CONTRAST  Result Date: 07/11/2023 CLINICAL DATA:  Right foot infection.  History of amputation. EXAM: MRI OF THE RIGHT FOREFOOT WITHOUT AND WITH CONTRAST TECHNIQUE: Multiplanar, multisequence MR imaging of the right foot was performed before and after the administration of intravenous contrast. CONTRAST:  9mL GADAVIST GADOBUTROL 1 MMOL/ML IV SOLN COMPARISON:  Right foot x-rays from same day. MRI right foot dated December 18, 2022. FINDINGS: Bones/Joint/Cartilage Prior transmetatarsal amputation. New intense marrow edema with associated enhancement and corresponding decreased T1 marrow signal involving the medial navicular, the entire medial and middle cuneiforms, the inferior aspect of the lateral cuneiform, and the residual second and third metatarsal bases. No fracture or joint effusion. Ligaments Medial and lateral ankle ligaments are grossly intact. Muscles and Tendons No tenosynovitis. Soft tissue Plantar ulceration of the medial aspect of the foot at the level of the medial cuneiform, extending to bone (series 7, image 27). Prominent surrounding soft tissue swelling with  enhancement. No fluid collection. No soft tissue mass. IMPRESSION: 1. Plantar ulceration of the medial aspect of the foot at the level of the medial cuneiform, extending to bone. Acute osteomyelitis of the medial navicular, the cuneiforms, and the residual second and third metatarsal bases. No abscess. Electronically Signed   By: Obie Dredge M.D.   On: 07/11/2023 16:23   DG Foot Complete Right  Result Date: 07/11/2023 CLINICAL DATA:  57 year old male with history of osteomyelitis status post amputation. Wound on the bottom of the right foot since April. EXAM: RIGHT FOOT COMPLETE - 3+ VIEW COMPARISON:  No priors. FINDINGS: Status post amputation of the foot beyond the proximal aspects of the metatarsals. Some lucency is noted in several bones, most apparent in the neck of the talus. Lucency and poorly defined trabeculations within sesamoid bones also noted. Multifocal joint space narrowing, subchondral sclerosis, subchondral cyst formation and osteophyte formation throughout the remaining portions of the midfoot and hindfoot indicative of osteoarthritis. Soft tissues are indistinct surrounding the remaining portion of the foot and appear mildly swollen. IMPRESSION: 1. Multiple areas of lucency in the remaining midfoot and hindfoot, most apparent in the neck of the talus. Underlying osteomyelitis should be considered. Further evaluation with MRI of the foot with and without IV gadolinium could provide additional diagnostic information. Electronically Signed   By: Trudie Reed M.D.   On: 07/11/2023 05:28   (Echo, Carotid, EGD, Colonoscopy, ERCP)    Subjective: Pt c/o fatigue    Discharge Exam: Vitals:   07/15/23 0410 07/15/23 0814  BP: 115/72 (!) 144/80  Pulse: 66 78  Resp: 18 18  Temp: 98.1 F (36.7 C) 98.4 F (36.9 C)  SpO2: 96% 95%   Vitals:   07/14/23 1629 07/14/23 2032 07/15/23 0410 07/15/23 0814  BP: 128/76 130/72 115/72 (!) 144/80  Pulse: 79 78 66 78  Resp: 16 20 18 18   Temp:  98.2 F (36.8 C) 98.2 F (36.8 C) 98.1 F (36.7 C) 98.4 F (36.9 C)  TempSrc: Oral Oral  Oral  SpO2: 96% 99% 96% 95%  Weight:      Height:        General: Pt is alert, awake, not in acute distress Cardiovascular: S1/S2 +, no rubs, no gallops Respiratory: CTA bilaterally, no wheezing, no rhonchi Abdominal: Soft, NT,obese, bowel sounds + Extremities:no cyanosis    The results of significant diagnostics from this hospitalization (including imaging, microbiology,  ancillary and laboratory) are listed below for reference.     Microbiology: Recent Results (from the past 240 hour(s))  Blood Culture (routine x 2)     Status: None (Preliminary result)   Collection Time: 07/11/23 12:18 AM   Specimen: BLOOD  Result Value Ref Range Status   Specimen Description BLOOD RIGHT HAND  Final   Special Requests   Final    BOTTLES DRAWN AEROBIC AND ANAEROBIC Blood Culture results may not be optimal due to an inadequate volume of blood received in culture bottles   Culture   Final    NO GROWTH 4 DAYS Performed at [facility], [address] [place], [place] [address]    Report Status PENDING  Incomplete  Blood Culture (routine x 2)     Status: None (Preliminary result)   Collection Time: 07/11/23 12:18 AM   Specimen: BLOOD  Result Value Ref Range Status   Specimen Description BLOOD RIGHT ANTECUBITAL  Final   Special Requests   Final    BOTTLES DRAWN AEROBIC AND ANAEROBIC Blood Culture results may not be optimal due to an excessive volume of blood received in culture bottles   Culture   Final    NO GROWTH 4 DAYS Performed at [facility], [address] [place], [place] [address]    Report Status PENDING  Incomplete  MRSA Next Gen by PCR, Nasal     Status: None   Collection Time: 07/11/23  5:30 AM   Specimen: Nasal Mucosa; Nasal Swab  Result Value Ref Range Status   MRSA by PCR Next Gen NOT DETECTED NOT DETECTED Final    Comment: (NOTE) The GeneXpert MRSA Assay  (FDA approved for NASAL specimens only), is one component of a comprehensive MRSA colonization surveillance program. It is not intended to diagnose MRSA infection nor to guide or monitor treatment for MRSA infections. Test performance is not FDA approved in patients less than 7 years old. Performed at [facility], [address] [place], [place] [address]      Labs: BNP (last 3 results) Recent Labs    12/18/22 1252  BNP 125.4*   Basic Metabolic Panel: Recent Labs  Lab 07/11/23 0704 07/12/23 0512 07/13/23 0520 07/14/23 0327 07/15/23 0417  NA 129* 134* 136 136 136  K 3.5 3.9 4.6 4.0 3.8  CL 101 102 103 105 103  CO2 20* 22 24 22 25   GLUCOSE 339* 273* 167* 259* 214*  BUN 10 13 13 12 12   CREATININE 0.74 0.74 0.80 0.76 0.67  CALCIUM 7.8* 7.9* 8.2* 8.2* 8.2*  MG  --  2.2 2.6* 2.3 2.1   Liver Function Tests: No results for input(s): "AST", "ALT", "ALKPHOS", "BILITOT", "PROT", "ALBUMIN" in the last 168 hours. No results for input(s): "LIPASE", "AMYLASE" in the last 168 hours. No results for input(s): "AMMONIA" in the last 168 hours. CBC: Recent Labs  Lab 07/10/23 2144 07/11/23 0704 07/13/23 0520 07/14/23 0327 07/15/23 0417  WBC 9.3 8.5 8.2 7.1 7.4  NEUTROABS 6.4  --   --   --   --   HGB 15.1 13.2 13.5 13.4 13.8  HCT 45.3 39.7 41.2 40.7 40.8  MCV 90.4 88.6 90.5 90.0 87.0  PLT 249 216 264 259 287   Cardiac Enzymes: No results for input(s): "CKTOTAL", "CKMB", "CKMBINDEX", "TROPONINI" in the last 168 hours. BNP: Invalid input(s): "POCBNP" CBG: Recent Labs  Lab 07/14/23 1121 07/14/23 1625 07/14/23 2029 07/15/23 0810 07/15/23 1141  GLUCAP 261* 227* 217* 206* 252*  D-Dimer No results for input(s): "DDIMER" in the last 72 hours. Hgb A1c No results for input(s): "HGBA1C" in the last 72 hours. Lipid Profile No results for input(s): "CHOL", "HDL", "LDLCALC", "TRIG", "CHOLHDL", "LDLDIRECT" in the last 72 hours. Thyroid function studies No results  for input(s): "TSH", "T4TOTAL", "T3FREE", "THYROIDAB" in the last 72 hours.  Invalid input(s): "FREET3" Anemia work up No results for input(s): "VITAMINB12", "FOLATE", "FERRITIN", "TIBC", "IRON", "RETICCTPCT" in the last 72 hours. Urinalysis    Component Value Date/Time   COLORURINE YELLOW (A) 12/18/2022 1133   APPEARANCEUR HAZY (A) 12/18/2022 1133   APPEARANCEUR Cloudy (A) 08/03/2022 1513   LABSPEC 1.024 12/18/2022 1133   PHURINE 5.0 12/18/2022 1133   GLUCOSEU NEGATIVE 12/18/2022 1133   GLUCOSEU NEGATIVE 09/21/2022 0736   HGBUR SMALL (A) 12/18/2022 1133   BILIRUBINUR NEGATIVE 12/18/2022 1133   BILIRUBINUR Negative 08/03/2022 1513   KETONESUR 20 (A) 12/18/2022 1133   PROTEINUR >=300 (A) 12/18/2022 1133   UROBILINOGEN 0.2 09/21/2022 0736   NITRITE NEGATIVE 12/18/2022 1133   LEUKOCYTESUR NEGATIVE 12/18/2022 1133   Sepsis Labs Recent Labs  Lab 07/11/23 0704 07/13/23 0520 07/14/23 0327 07/15/23 0417  WBC 8.5 8.2 7.1 7.4   Microbiology Recent Results (from the past 240 hour(s))  Blood Culture (routine x 2)     Status: None (Preliminary result)   Collection Time: 07/11/23 12:18 AM   Specimen: BLOOD  Result Value Ref Range Status   Specimen Description BLOOD RIGHT HAND  Final   Special Requests   Final    BOTTLES DRAWN AEROBIC AND ANAEROBIC Blood Culture results may not be optimal due to an inadequate volume of blood received in culture bottles   Culture   Final    NO GROWTH 4 DAYS Performed at [facility], [address] [place], [place] [address]    Report Status PENDING  Incomplete  Blood Culture (routine x 2)     Status: None (Preliminary result)   Collection Time: 07/11/23 12:18 AM   Specimen: BLOOD  Result Value Ref Range Status   Specimen Description BLOOD RIGHT ANTECUBITAL  Final   Special Requests   Final    BOTTLES DRAWN AEROBIC AND ANAEROBIC Blood Culture results may not be optimal due to an excessive volume of blood received in culture bottles    Culture   Final    NO GROWTH 4 DAYS Performed at [facility], [address] [place], [place] [address]    Report Status PENDING  Incomplete  MRSA Next Gen by PCR, Nasal     Status: None   Collection Time: 07/11/23  5:30 AM   Specimen: Nasal Mucosa; Nasal Swab  Result Value Ref Range Status   MRSA by PCR Next Gen NOT DETECTED NOT DETECTED Final    Comment: (NOTE) The GeneXpert MRSA Assay (FDA approved for NASAL specimens only), is one component of a comprehensive MRSA colonization surveillance program. It is not intended to diagnose MRSA infection nor to guide or monitor treatment for MRSA infections. Test performance is not FDA approved in patients less than 45 years old. Performed at [facility], [address] [place], [place] [address]      Time coordinating discharge: Over 30 minutes  SIGNED:   Charise Killian, MD  [facility] 07/15/2023, 12:49 PM Pager   If 7PM-7AM, please contact night-coverage [email or web address]

## 2023-07-16 ENCOUNTER — Other Ambulatory Visit: Payer: Self-pay | Admitting: Internal Medicine

## 2023-07-16 LAB — CULTURE, BLOOD (ROUTINE X 2)
Culture: NO GROWTH
Culture: NO GROWTH

## 2023-07-17 ENCOUNTER — Telehealth: Payer: Self-pay

## 2023-07-17 MED ORDER — OXYCODONE HCL 5 MG PO TABS: 7.5000 mg | ORAL_TABLET | Freq: Every day | ORAL | 0 refills | Status: DC

## 2023-07-17 NOTE — Telephone Encounter (Signed)
Patient called wanting to see if appointment on 12/19 could be a virtual appointment due to work. Patient was seen in the hospital by Dr.Van Dam while admitted.

## 2023-07-18 NOTE — Telephone Encounter (Signed)
Patient called back this morning. Patient thought his appointment was today. Informed him it is 12/19 not 11/19. Patient can come in for visit on 12/19.

## 2023-07-26 ENCOUNTER — Other Ambulatory Visit: Payer: BC Managed Care – PPO

## 2023-07-31 ENCOUNTER — Telehealth: Payer: Self-pay

## 2023-08-07 ENCOUNTER — Ambulatory Visit: Payer: BC Managed Care – PPO | Admitting: Internal Medicine

## 2023-08-07 ENCOUNTER — Encounter: Payer: Self-pay | Admitting: Internal Medicine

## 2023-08-07 VITALS — BP 128/74 | HR 109 | Ht 65.0 in | Wt 227.0 lb

## 2023-08-07 DIAGNOSIS — I10 Essential (primary) hypertension: Secondary | ICD-10-CM

## 2023-08-07 DIAGNOSIS — Z89431 Acquired absence of right foot: Secondary | ICD-10-CM

## 2023-08-07 DIAGNOSIS — E1165 Type 2 diabetes mellitus with hyperglycemia: Secondary | ICD-10-CM

## 2023-08-07 DIAGNOSIS — Z599 Problem related to housing and economic circumstances, unspecified: Secondary | ICD-10-CM

## 2023-08-07 DIAGNOSIS — L97512 Non-pressure chronic ulcer of other part of right foot with fat layer exposed: Secondary | ICD-10-CM

## 2023-08-07 DIAGNOSIS — E08621 Diabetes mellitus due to underlying condition with foot ulcer: Secondary | ICD-10-CM | POA: Diagnosis not present

## 2023-08-07 DIAGNOSIS — F331 Major depressive disorder, recurrent, moderate: Secondary | ICD-10-CM

## 2023-08-07 DIAGNOSIS — L97412 Non-pressure chronic ulcer of right heel and midfoot with fat layer exposed: Secondary | ICD-10-CM

## 2023-08-07 NOTE — Patient Instructions (Addendum)
Please start wearing the [facility] so I can decide if you need to be on additional medications to lower your blood sugars  I will reffill the oxycodone  on Dec 18th

## 2023-08-07 NOTE — Progress Notes (Unsigned)
Subjective:  Patient ID: [name], male    DOB: Jan 05, 1966  Age: 57 y.o. MRN: [id number]  CC: {There were no encounter diagnoses. (Refresh or delete this SmartLink)}   HPI [name] presents for  Chief Complaint  Patient presents with   Medical Management of Chronic Issues    Follow up     [name] was admitted for possible sepsis secondary to cellulitis and chronic osteomyelitis  of the the right heel/foot from infection of his plantar ulcer on Nov 11 when he presented wit with pain swelling and redness of the right foot similar to presentation when he was hospitalized Back in April 2024 for sepsis secondary to cellulitis.  Wound cultures grew Klebsiella and Enterococcus for which he was treated with 2 weeks of Augmentin and Bactrim.  He has not followed with podiatry or wound care. Taking augmentin and doxycycline  for an 8 week  ? Program.   Has chronic osteomyelitis of the foot ,  vascular evaluation was normal; due to the extent of the infection he was advised to have an  amputation of foot but declined the procedure.    Has not seen podiatry yet .  Appt rescheduled for the Dec 19  Has not seen ID yet.  DEc 19th with [name].    Not seeing wound care ,  dressing his own wound.  He is not offloading the foot at work  for unclear reasons.    A1c was 8.6   during recent hospitalization.  Once he was home he noted a gradual improvement in  BS readings.  190 on day 1,  currently Home readings have been 130-140 in the fasting state for the past 2 weeks. Not using Dexcom yet because he has been procrastinating reading the the manual .  Diet reviewed:  low carb.  Has been exercising regularly but not losing weight.  Has been less careful on the weekends.   He has to climb 19 stairs to his apartment . The only one he could afford , no ground floors. His work will not allow him to use an offloading device because he has to be mobile.   He is financially strapped and is applying for  disability , has an appt on jan 10th.      Using oxycodone refill was Nov 18  refill     Diet   Outpatient Medications Prior to Visit  Medication Sig Dispense Refill   amoxicillin-clavulanate (AUGMENTIN) 875-125 MG tablet Take 1 tablet by mouth 2 (two) times daily. 88 tablet 0   aspirin EC 81 MG tablet Take 81 mg by mouth daily. Swallow whole.     atorvastatin (LIPITOR) 20 MG tablet Take 1 tablet (20 mg total) by mouth daily. 90 tablet 3   doxycycline (VIBRA-TABS) 100 MG tablet Take 1 tablet (100 mg total) by mouth 2 (two) times daily. 88 tablet 0   metFORMIN (GLUCOPHAGE-XR) 500 MG 24 hr tablet TAKE 1 TABLET BY MOUTH EVERY DAY WITH BREAKFAST 90 tablet 1   Multiple Vitamins-Minerals (MULTIVITAMIN WITH MINERALS) tablet Take 1 tablet by mouth daily.     oxyCODONE (ROXICODONE) 5 MG immediate release tablet Take 1.5 tablets (7.5 mg total) by mouth daily. As needed for severe pain 45 tablet 0   SODIUM FLUORIDE 5000 SENSITIVE 1.1-5 % GEL Brush as directed by your provider.     tamsulosin (FLOMAX) 0.4 MG CAPS capsule Take 0.4 mg by mouth daily.     telmisartan (MICARDIS) 20 MG tablet Take  1 tablet (20 mg total) by mouth at bedtime. 90 tablet 0   Continuous Glucose Receiver (DEXCOM G7 RECEIVER) DEVI Check blood sugars at least 3 times a day (Patient not taking: Reported on 08/07/2023) 1 each 0   Continuous Glucose Sensor (DEXCOM G7 SENSOR) MISC Use 1 sensor every 10 days for 30 days. No refills. (Patient not taking: Reported on 08/07/2023) 3 each 0   No facility-administered medications prior to visit.    Review of Systems;  Patient denies headache, fevers, malaise, unintentional weight loss, skin rash, eye pain, sinus congestion and sinus pain, sore throat, dysphagia,  hemoptysis , cough, dyspnea, wheezing, chest pain, palpitations, orthopnea, edema, abdominal pain, nausea, melena, diarrhea, constipation, flank pain, dysuria, hematuria, urinary  Frequency, nocturia, numbness, tingling, seizures,   Focal weakness, Loss of consciousness,  Tremor, insomnia, depression, anxiety, and suicidal ideation.      Objective:  BP 128/74   Pulse (!) 109   Ht 5\' 5"  (1.651 m)   Wt 227 lb (103 kg)   SpO2 98%   BMI 37.77 kg/m   BP Readings from Last 3 Encounters:  08/07/23 128/74  07/15/23 (!) 144/80  12/29/22 128/74    Wt Readings from Last 3 Encounters:  08/07/23 227 lb (103 kg)  07/10/23 222 lb (100.7 kg)  12/29/22 226 lb (102.5 kg)    Physical Exam  Lab Results  Component Value Date   HGBA1C 8.6 (H) 07/11/2023   HGBA1C 7.0 (H) 12/29/2022   HGBA1C 6.3 09/21/2022    Lab Results  Component Value Date   CREATININE 0.67 07/15/2023   CREATININE 0.76 07/14/2023   CREATININE 0.80 07/13/2023    Lab Results  Component Value Date   WBC 7.4 07/15/2023   HGB 13.8 07/15/2023   HCT 40.8 07/15/2023   PLT 287 07/15/2023   GLUCOSE 214 (H) 07/15/2023   CHOL 116 09/21/2022   TRIG 92.0 09/21/2022   HDL 37.30 (L) 09/21/2022   LDLDIRECT 121.0 11/27/2019   LDLCALC 61 09/21/2022   ALT 36 12/18/2022   AST 41 12/18/2022   NA 136 07/15/2023   K 3.8 07/15/2023   CL 103 07/15/2023   CREATININE 0.67 07/15/2023   BUN 12 07/15/2023   CO2 25 07/15/2023   TSH 0.59 02/22/2022   PSA 0.18 02/22/2022   INR 1.3 (H) 12/19/2022   HGBA1C 8.6 (H) 07/11/2023   MICROALBUR 1.2 10/04/2022    MR FOOT RIGHT W WO CONTRAST  Result Date: 07/11/2023 CLINICAL DATA:  Right foot infection.  History of amputation. EXAM: MRI OF THE RIGHT FOREFOOT WITHOUT AND WITH CONTRAST TECHNIQUE: Multiplanar, multisequence MR imaging of the right foot was performed before and after the administration of intravenous contrast. CONTRAST:  9mL GADAVIST GADOBUTROL 1 MMOL/ML IV SOLN COMPARISON:  Right foot x-rays from same day. MRI right foot dated December 18, 2022. FINDINGS: Bones/Joint/Cartilage Prior transmetatarsal amputation. New intense marrow edema with associated enhancement and corresponding decreased T1 marrow signal  involving the medial navicular, the entire medial and middle cuneiforms, the inferior aspect of the lateral cuneiform, and the residual second and third metatarsal bases. No fracture or joint effusion. Ligaments Medial and lateral ankle ligaments are grossly intact. Muscles and Tendons No tenosynovitis. Soft tissue Plantar ulceration of the medial aspect of the foot at the level of the medial cuneiform, extending to bone (series 7, image 27). Prominent surrounding soft tissue swelling with enhancement. No fluid collection. No soft tissue mass. IMPRESSION: 1. Plantar ulceration of the medial aspect of the foot at the  level of the medial cuneiform, extending to bone. Acute osteomyelitis of the medial navicular, the cuneiforms, and the residual second and third metatarsal bases. No abscess. Electronically Signed   By: Obie Dredge M.D.   On: 07/11/2023 16:23   DG Foot Complete Right  Result Date: 07/11/2023 CLINICAL DATA:  57 year old male with history of osteomyelitis status post amputation. Wound on the bottom of the right foot since April. EXAM: RIGHT FOOT COMPLETE - 3+ VIEW COMPARISON:  No priors. FINDINGS: Status post amputation of the foot beyond the proximal aspects of the metatarsals. Some lucency is noted in several bones, most apparent in the neck of the talus. Lucency and poorly defined trabeculations within sesamoid bones also noted. Multifocal joint space narrowing, subchondral sclerosis, subchondral cyst formation and osteophyte formation throughout the remaining portions of the midfoot and hindfoot indicative of osteoarthritis. Soft tissues are indistinct surrounding the remaining portion of the foot and appear mildly swollen. IMPRESSION: 1. Multiple areas of lucency in the remaining midfoot and hindfoot, most apparent in the neck of the talus. Underlying osteomyelitis should be considered. Further evaluation with MRI of the foot with and without IV gadolinium could provide additional diagnostic  information. Electronically Signed   By: Trudie Reed M.D.   On: 07/11/2023 05:28    Assessment & Plan:  .There are no diagnoses linked to this encounter.   I provided 30 minutes of face-to-face time during this encounter reviewing patient's last visit with me, patient's  most recent visit with cardiology,  nephrology,  and neurology,  recent surgical and non surgical procedures, previous  labs and imaging studies, counseling on currently addressed issues,  and post visit ordering to diagnostics and therapeutics .   Follow-up: No follow-ups on file.   Sherlene Shams, MD

## 2023-08-08 ENCOUNTER — Other Ambulatory Visit: Payer: Self-pay | Admitting: Internal Medicine

## 2023-08-08 NOTE — Assessment & Plan Note (Signed)
Symptoms have been aggravated by marital estrangement , financial strain, and impending loss of right foot due to osteomyelitis.  He declines medication adjustment ,  has been asked to return in 2 weeks for follow up .  Care mgmt referral made

## 2023-08-08 NOTE — Assessment & Plan Note (Signed)
He refused additional medications during November hospitalization and is taking only metformin.  Fasting blood sugars by report are now 130 to 140.  No other data available to safely adjust regi,en. Encouraged to apply Dexcom ASAP for glycemic control can be modified safely   Lab Results  Component Value Date   HGBA1C 8.6 (H) 07/11/2023

## 2023-08-08 NOTE — Progress Notes (Deleted)
Subjective:  Patient ID: [name], male    DOB: 1966/07/22  Age: 57 y.o. MRN: [id number]  CC: The primary encounter diagnosis was S/P transmetatarsal amputation of foot, right (HCC). 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 Of the right heel/foot  . Ulcer has been present for over a year.  He presented with pain,  swelling and redness of the right foot similar to previous hospitalization in April 2024  Wound cultures from April admission grew Klebsiella and Enterococcus for which he was treated with 2 weeks of Augmentin and Bactrim.   Since his April admission he has been lost to follow up regarding podiatry and  wound care due to financial hardship and work schedule (he works full time  at the [employer])  He has not been offloading the right foot at work because he states that his supervisor told him he  couldn't perform his duties if he used a ankle scooter.  He was diagnosed during November admission with chronic osteomyelitis of the foot . Admission films noted multiple lucencies in the bones of the midfoot and hind foot.  Empriric antibiotics were started, (Vancomycin and  Unasyn)   He was seen by ID,   His  vascular evaluation was normal by angiography,   but due to the extent of the infection and the chronicity of his condition,  he was advised to have an  amputation of the entire foot.  He declined amputation and was discharged home on augmentin and doxycycline with advice to follow up with podiatry and ID> .    Has not seen podiatry yet .  Appt rescheduled for the Dec 19  Has not seen ID yet.  Rescheduled for DEc 19th with [name].   Not seeing wound care , has been  dressing his own wound.    Type 2 DM:  previously controlled with metformin and diet,  HOwever his serum glucose was 480 on admission and his A1c was  8.6   as checked during  November hospitalization.  No medication changes were made per hospital discharge (patient  declined any changes)   Post discharge  he ha noted  a gradual improvement in  BS readings.  190 on day 1,  currently Home readings have been 130-140 in the fasting state for the past 2 weeks. He has a Dexcom CBG but has not applied it .  Diet reviewed:  low carb.  Has been exercising regularly but not losing weight.  Has been less careful on the weekends.   He is clearly disabled due to his foot ulcer.  Since hs divorce his living conditions are untenable.  He has to climb 19 stairs to his apartment, because he could not afford a ground  floor appt .  He states that his supervisor  will not allow him to use an offloading knee scooter  beucase it will interfer with his duties. He is financially strapped and is applying for disability , has an appt on jan 10th.      Using oxycodone refill was Nov 18  refill     Diet   Outpatient Medications Prior to Visit  Medication Sig Dispense Refill   amoxicillin-clavulanate (AUGMENTIN) 875-125 MG tablet Take 1 tablet by mouth 2 (two) times daily. 88 tablet 0   aspirin EC 81 MG tablet Take 81 mg by mouth daily. Swallow whole.     atorvastatin (LIPITOR) 20 MG tablet Take 1 tablet (20 mg total) by mouth daily. 90 tablet 3    doxycycline (VIBRA-TABS) 100 MG tablet Take 1 tablet (100 mg total) by mouth 2 (two) times daily. 88 tablet 0   metFORMIN (GLUCOPHAGE-XR) 500 MG 24 hr tablet TAKE 1 TABLET BY MOUTH EVERY DAY WITH BREAKFAST 90 tablet 1   Multiple Vitamins-Minerals (MULTIVITAMIN WITH MINERALS) tablet Take 1 tablet by mouth daily.     oxyCODONE (ROXICODONE) 5 MG immediate release tablet Take 1.5 tablets (7.5 mg total) by mouth daily. As needed for severe pain 45 tablet 0   SODIUM FLUORIDE 5000 SENSITIVE 1.1-5 % GEL Brush as directed by your provider.     tamsulosin (FLOMAX) 0.4 MG CAPS capsule Take 0.4 mg by mouth daily.     telmisartan (MICARDIS) 20 MG tablet Take 1 tablet (20 mg total) by mouth at bedtime. 90 tablet 0   Continuous Glucose Receiver (DEXCOM G7 RECEIVER) DEVI Check blood sugars at least 3 times a day (Patient not taking: Reported on 08/07/2023) 1 each 0   Continuous Glucose Sensor (DEXCOM G7 SENSOR) MISC Use 1 sensor every 10 days for 30 days. No refills. (Patient not taking: Reported on 08/07/2023) 3 each 0   No facility-administered medications prior to visit.    Review of Systems;  Patient denies headache, fevers, malaise, unintentional weight loss, skin rash, eye pain, sinus congestion and sinus pain, sore throat, dysphagia,  hemoptysis , cough, dyspnea, wheezing, chest pain, palpitations, orthopnea, edema, abdominal pain, nausea, melena, diarrhea, constipation, flank pain, dysuria, hematuria, urinary  Frequency, nocturia, numbness, tingling, seizures,  Focal weakness, Loss of consciousness,  Tremor, insomnia, depression, anxiety, and suicidal ideation.      Objective:  BP 128/74   Pulse (!) 109   Ht 5\' 5"  (1.651 m)   Wt 227 lb (103 kg)   SpO2 98%   BMI 37.77 kg/m   BP Readings from Last 3 Encounters:  08/07/23 128/74  07/15/23 (!) 144/80  12/29/22 128/74    Wt Readings from Last 3 Encounters:  08/07/23 227 lb (103 kg)  07/10/23 222 lb (100.7 kg)  12/29/22 226 lb (102.5 kg)     Physical Exam Vitals reviewed.  Constitutional:      General: He is not in acute distress.    Appearance: Normal appearance. He is normal weight. He is not ill-appearing, toxic-appearing or diaphoretic.  HENT:     Head: Normocephalic.  Eyes:     General: No scleral icterus.       Right eye: No discharge.        Left eye: No discharge.     Conjunctiva/sclera: Conjunctivae normal.  Cardiovascular:     Rate and Rhythm: Normal rate and regular rhythm.     Heart sounds: Normal heart sounds.  Pulmonary:     Effort: Pulmonary effort  is normal. No respiratory distress.     Breath sounds: Normal breath sounds.  Musculoskeletal:        General: Deformity present. Normal range of motion.     Cervical back: Normal range of motion.     Right foot: Deformity present.     Right Lower Extremity: (see comments) Feet:     Comments: Right foot with metatarsal amputations (old) Large plantar ulcer with fat layer exposed  Skin:    General: Skin is warm and dry.  Neurological:     General: No focal deficit present.     Mental Status: He is alert and oriented to person, place, and time. Mental status is at baseline.  Psychiatric:        Mood and Affect: Mood normal.        Behavior: Behavior normal.        Thought Content: Thought content normal.        Judgment: Judgment normal.    Lab Results  Component Value Date   HGBA1C 8.6 (H) 07/11/2023   HGBA1C 7.0 (H) 12/29/2022   HGBA1C 6.3 09/21/2022    Lab Results  Component Value Date   CREATININE 0.67 07/15/2023   CREATININE 0.76 07/14/2023   CREATININE 0.80 07/13/2023    Lab Results  Component Value Date   WBC 7.4 07/15/2023   HGB 13.8 07/15/2023   HCT 40.8 07/15/2023   PLT 287 07/15/2023   GLUCOSE 214 (H) 07/15/2023   CHOL 116 09/21/2022   TRIG 92.0 09/21/2022   HDL 37.30 (L) 09/21/2022   LDLDIRECT 121.0 11/27/2019   LDLCALC 61 09/21/2022   ALT 36 12/18/2022   AST 41 12/18/2022   NA 136 07/15/2023   K 3.8 07/15/2023    CL 103 07/15/2023   CREATININE 0.67 07/15/2023   BUN 12 07/15/2023   CO2 25 07/15/2023   TSH 0.59 02/22/2022   PSA 0.18 02/22/2022   INR 1.3 (H) 12/19/2022   HGBA1C 8.6 (H) 07/11/2023   MICROALBUR 1.2 10/04/2022    MR FOOT RIGHT W WO CONTRAST  Result Date: 07/11/2023 CLINICAL DATA:  Right foot infection.  History of amputation. EXAM: MRI OF THE RIGHT FOREFOOT WITHOUT AND WITH CONTRAST TECHNIQUE: Multiplanar, multisequence MR imaging of the right foot was performed before and after the administration of intravenous contrast. CONTRAST:  9mL GADAVIST GADOBUTROL 1 MMOL/ML IV SOLN COMPARISON:  Right foot x-rays from same day. MRI right foot dated December 18, 2022. FINDINGS: Bones/Joint/Cartilage Prior transmetatarsal amputation. New intense marrow edema with associated enhancement and corresponding decreased T1 marrow signal involving the medial navicular, the entire medial and middle cuneiforms, the inferior aspect of the lateral cuneiform, and the residual second and third metatarsal bases. No fracture or joint effusion. Ligaments Medial and lateral ankle ligaments are grossly intact. Muscles and Tendons No tenosynovitis. Soft tissue Plantar ulceration of the medial aspect of the foot at the level of the medial cuneiform, extending to bone (series 7, image 27). Prominent surrounding soft tissue swelling with enhancement. No fluid collection. No soft tissue mass. IMPRESSION: 1. Plantar ulceration of the medial aspect of the foot at the level of the medial cuneiform, extending to bone. Acute osteomyelitis of the medial navicular, the cuneiforms, and the residual second and third metatarsal bases. No abscess. Electronically Signed   By: Obie Dredge M.D.   On: 07/11/2023 16:23   DG Foot Complete Right  Result Date: 07/11/2023 CLINICAL DATA:  57 year old male with history of osteomyelitis status post amputation. Wound on the bottom  of the right foot since April. EXAM: RIGHT FOOT COMPLETE - 3+ VIEW  COMPARISON:  No priors. FINDINGS: Status post amputation of the foot beyond the proximal aspects of the metatarsals. Some lucency is noted in several bones, most apparent in the neck of the talus. Lucency and poorly defined trabeculations within sesamoid bones also noted. Multifocal joint space narrowing, subchondral sclerosis, subchondral cyst formation and osteophyte formation throughout the remaining portions of the midfoot and hindfoot indicative of osteoarthritis. Soft tissues are indistinct surrounding the remaining portion of the foot and appear mildly swollen. IMPRESSION: 1. Multiple areas of lucency in the remaining midfoot and hindfoot, most apparent in the neck of the talus. Underlying osteomyelitis should be considered. Further evaluation with MRI of the foot with and without IV gadolinium could provide additional diagnostic information. Electronically Signed   By: Trudie Reed M.D.   On: 07/11/2023 05:28    Assessment & Plan:  .S/P transmetatarsal amputation of foot, right [facility]) Assessment & Plan: Repeat surgery done April 2023 noting chronic osteomyelitis  whic has now progressed to the mid and hind foot by November admission.  Amputation deferred by patient but he is aware that this is inevitable.   Orders: -     AMB Referral [facility] Care Management  Moderate episode of recurrent major depressive disorder [facility]) Assessment & Plan: Symptoms have been aggravated by marital estrangement , financial strain, and impending loss of right foot due to osteomyelitis.  He declines medication adjustment ,  has been asked to return in 2 weeks for follow up .  Care mgmt referral made   Orders: -     AMB Referral [facility] Care Management  Diabetic ulcer of right midfoot associated with diabetes mellitus due to underlying condition, with fat layer exposed (HCC) -     AMB Referral [facility] Care Management  Financial difficulties -     AMB Referral [facility] Care Management  Uncontrolled type 2 diabetes  mellitus with hyperglycemia, without long-term current use of insulin (HCC) Assessment & Plan: He refused additional medications during November hospitalization and is taking only metformin.  Fasting blood sugars by report are now 130 to 140.  No other data available to safely adjust regi,en. 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 .  Continue augmentin and doxycycline pending follow up with ID   Hypertension, unspecified type Assessment & Plan: He is no longer taking telmisartan per hospital records.       I provided 30 minutes of face-to-face time during this encounter reviewing patient's hospitalization Nov 11 to Nov 16,  his  last visit with me, patient's    recent surgical and non surgical procedures, previous  labs and imaging studies, counseling on currently addressed issues,  and post visit ordering to diagnostics and therapeutics .   Follow-up: Return in about 3 months (around 11/05/2023) for chronic pain management.   Sherlene Shams, MD

## 2023-08-08 NOTE — Assessment & Plan Note (Signed)
Secondary to sensory neuropathy and nonadherence to use of offloading device , now with progressive osteomyelitis. .  Continue augmentin and doxycycline pending follow up with ID

## 2023-08-08 NOTE — Assessment & Plan Note (Signed)
He is no longer taking telmisartan per hospital records.

## 2023-08-08 NOTE — Assessment & Plan Note (Signed)
Repeat surgery done April 2023 noting chronic osteomyelitis  whic has now progressed to the mid and hind foot by November admission.  Amputation deferred by patient but he is aware that this is inevitable.

## 2023-08-09 ENCOUNTER — Telehealth: Payer: Self-pay | Admitting: *Deleted

## 2023-08-09 NOTE — Progress Notes (Signed)
  Care Coordination  Outreach Note  08/09/2023 Name: [name] MRN: [id number] DOB: 19-Sep-1965   Care Coordination Outreach Attempts: An unsuccessful telephone outreach was attempted today to offer the patient information about available care coordination services.  Follow Up Plan:  Additional outreach attempts will be made to offer the patient care coordination information and services.   Encounter Outcome:  No Answer  Burman Nieves, CCMA Care Coordination Care Guide Direct Dial: [phone]

## 2023-08-10 NOTE — Progress Notes (Signed)
  Care Coordination  Outreach Note  08/10/2023 Name: [name] MRN: [id number] DOB: 1966/05/17   Care Coordination Outreach Attempts: A second unsuccessful outreach was attempted today to offer the patient with information about available care coordination services.  Follow Up Plan:  Additional outreach attempts will be made to offer the patient care coordination information and services.   Encounter Outcome:  No Answer  Burman Nieves, CCMA Care Coordination Care Guide Direct Dial: [phone]

## 2023-08-13 ENCOUNTER — Encounter: Payer: Self-pay | Admitting: Internal Medicine

## 2023-08-14 NOTE — Progress Notes (Signed)
  Care Coordination   Note   08/14/2023 Name: DAMETRI DRANE MRN: [id number] DOB: 1965/09/21  OZMAR GEIMER is a 57 y.o. year old male who sees Darrick Huntsman, Mar Daring, MD for primary care. I reached out to Olegario Shearer by phone today to offer care coordination services.  Mr. Propson was given information about Care Coordination services today including:   The Care Coordination services include support from the care team which includes your Nurse Coordinator, Clinical Social Worker, or Pharmacist.  The Care Coordination team is here to help remove barriers to the health concerns and goals most important to you. Care Coordination services are voluntary, and the patient may decline or stop services at any time by request to their care team member.   Care Coordination Consent Status: Patient agreed to services and verbal consent obtained.   Follow up plan:  Telephone appointment with care coordination team member scheduled for:  08/15/2023  Encounter Outcome:  Patient Scheduled  Burman Nieves, [facility] Care Coordination Care Guide Direct Dial: [phone]

## 2023-08-15 ENCOUNTER — Ambulatory Visit: Payer: Self-pay | Admitting: *Deleted

## 2023-08-15 NOTE — Patient Outreach (Signed)
  Care Coordination   Initial Visit Note   08/15/2023 Name: [name] MRN: [id number] DOB: 07-19-1966  [name] is a 57 y.o. year old male who sees Darrick Huntsman, Mar Daring, MD for primary care. I spoke with  [name] by phone today.  What matters to the patients health and wellness today?  Patient  discussed possibility of an amputation of his foot. Anticipated care needs and financial challenges discussed related to possible surgery. Encouraged patient to look into FMLA.   Goals Addressed             This Visit's Progress    Adjustment to medical condtion       Activities and task to complete in order to accomplish goals.   EMOTIONAL / MENTAL HEALTH SUPPORT Self Support options  (continue to educate self on current medical condition and treatment options, continue to consider all options for follow up care , continue to consider ongoing mental health options for additional support )         SDOH assessments and interventions completed:  Yes  SDOH Interventions Today    Flowsheet Row Most Recent Value  SDOH Interventions   Food Insecurity Interventions Intervention Not Indicated  [uses local food bank 2x per month]  Housing Interventions Intervention Not Indicated  Transportation Interventions Intervention Not Indicated  Depression Interventions/Treatment  Medication  [patient denies need for counseling, states that he has tried it before and did not find it beneficial]        Care Coordination Interventions:  Yes, provided  Interventions Today    Flowsheet Row Most Recent Value  Chronic Disease   Chronic disease during today's visit Diabetes, Other  [foot ulcer, depression, financial difficulties]  General Interventions   General Interventions Discussed/Reviewed General Interventions Discussed, Doctor Visits  [Needs assessment completed-discussed possibility that foot will be amputated and anticipated needs for care]  Doctor Visits Discussed/Reviewed Doctor  Visits Reviewed  [Podiatrist and Vascular on 08/17/23-]  Mental Health Interventions   Mental Health Discussed/Reviewed Mental Health Discussed, Coping Strategies, Depression  [encouraged verbalization of feelings related to anticipated surgery needs, normalized feelings of grief and sadness, discussed importance of developing a plan of care to address care and financial needs post any planned medical procedures]  Safety Interventions   Safety Discussed/Reviewed Safety Discussed  [Pt denies thoughts of harm to self or others-encouraged patient to contact [phone] in the event of a mental health crisis.]       Follow up plan: Follow up call scheduled for 08/29/23    Encounter Outcome:  Patient Visit Completed

## 2023-08-15 NOTE — Patient Instructions (Signed)
Visit Information  Thank you for taking time to visit with me today. Please don't hesitate to contact me if I can be of assistance to you.   Following are the goals we discussed today:   Goals Addressed             This Visit's Progress    Adjustment to medical condtion       Activities and task to complete in order to accomplish goals.   EMOTIONAL / MENTAL HEALTH SUPPORT Self Support options  (continue to educate self on current medical condition and treatment options, continue to consider all options for follow up care , continue to consider ongoing mental health options for additional support )         Our next appointment is by telephone on 08/29/23 at 1pm  Please call the care guide team at [phone] if you need to cancel or reschedule your appointment.   If you are experiencing a Mental Health or Behavioral Health Crisis or need someone to talk to, please call the Suicide and Crisis Lifeline: 988   Patient verbalizes understanding of instructions and care plan provided today and agrees to view in MyChart. Active MyChart status and patient understanding of how to access instructions and care plan via MyChart confirmed with patient.     Telephone follow up appointment with care management team member scheduled for: 08/29/23  Verna Czech, LCSW [place]  [facility], [facility] Licensed Clinical Social Worker Care Coordinator  Direct Dial: [phone]

## 2023-08-17 ENCOUNTER — Ambulatory Visit: Payer: BC Managed Care – PPO | Attending: Infectious Diseases | Admitting: Infectious Diseases

## 2023-08-17 ENCOUNTER — Other Ambulatory Visit
Admission: RE | Admit: 2023-08-17 | Discharge: 2023-08-17 | Disposition: A | Discharge status: Home / Self Care | Payer: BC Managed Care – PPO | Source: Ambulatory Visit | Attending: Infectious Diseases | Admitting: Infectious Diseases

## 2023-08-17 ENCOUNTER — Telehealth: Payer: Self-pay | Admitting: Internal Medicine

## 2023-08-17 VITALS — BP 147/78 | HR 82 | Temp 97.2°F | Ht 70.0 in | Wt 243.0 lb

## 2023-08-17 DIAGNOSIS — I10 Essential (primary) hypertension: Secondary | ICD-10-CM | POA: Diagnosis not present

## 2023-08-17 DIAGNOSIS — E11621 Type 2 diabetes mellitus with foot ulcer: Secondary | ICD-10-CM | POA: Insufficient documentation

## 2023-08-17 DIAGNOSIS — Z7182 Exercise counseling: Secondary | ICD-10-CM | POA: Insufficient documentation

## 2023-08-17 DIAGNOSIS — Z7984 Long term (current) use of oral hypoglycemic drugs: Secondary | ICD-10-CM | POA: Diagnosis not present

## 2023-08-17 DIAGNOSIS — E11628 Type 2 diabetes mellitus with other skin complications: Secondary | ICD-10-CM | POA: Insufficient documentation

## 2023-08-17 DIAGNOSIS — L089 Local infection of the skin and subcutaneous tissue, unspecified: Secondary | ICD-10-CM | POA: Diagnosis present

## 2023-08-17 DIAGNOSIS — L97419 Non-pressure chronic ulcer of right heel and midfoot with unspecified severity: Secondary | ICD-10-CM | POA: Insufficient documentation

## 2023-08-17 LAB — CBC WITH DIFFERENTIAL/PLATELET
Abs Immature Granulocytes: 0.04 10*3/uL (ref 0.00–0.07)
Basophils Absolute: 0.2 10*3/uL — ABNORMAL HIGH (ref 0.0–0.1)
Basophils Relative: 3 %
Eosinophils Absolute: 0.4 10*3/uL (ref 0.0–0.5)
Eosinophils Relative: 5 %
HCT: 45.5 % (ref 39.0–52.0)
Hemoglobin: 15.1 g/dL (ref 13.0–17.0)
Immature Granulocytes: 1 %
Lymphocytes Relative: 24 %
Lymphs Abs: 1.7 10*3/uL (ref 0.7–4.0)
MCH: 29.7 pg (ref 26.0–34.0)
MCHC: 33.2 g/dL (ref 30.0–36.0)
MCV: 89.6 fL (ref 80.0–100.0)
Monocytes Absolute: 1.2 10*3/uL — ABNORMAL HIGH (ref 0.1–1.0)
Monocytes Relative: 17 %
Neutro Abs: 3.6 10*3/uL (ref 1.7–7.7)
Neutrophils Relative %: 50 %
Platelets: 204 10*3/uL (ref 150–400)
RBC: 5.08 MIL/uL (ref 4.22–5.81)
RDW: 14.4 % (ref 11.5–15.5)
WBC: 7 10*3/uL (ref 4.0–10.5)
nRBC: 0 % (ref 0.0–0.2)

## 2023-08-17 LAB — COMPREHENSIVE METABOLIC PANEL
ALT: 32 U/L (ref 0–44)
AST: 31 U/L (ref 15–41)
Albumin: 3.7 g/dL (ref 3.5–5.0)
Alkaline Phosphatase: 52 U/L (ref 38–126)
Anion gap: 10 (ref 5–15)
BUN: 18 mg/dL (ref 6–20)
CO2: 24 mmol/L (ref 22–32)
Calcium: 8.6 mg/dL — ABNORMAL LOW (ref 8.9–10.3)
Chloride: 101 mmol/L (ref 98–111)
Creatinine, Ser: 0.81 mg/dL (ref 0.61–1.24)
GFR, Estimated: >60 mL/min (ref >60)
Glucose, Bld: 292 mg/dL — ABNORMAL HIGH (ref 70–99)
Potassium: 4.4 mmol/L (ref 3.5–5.1)
Sodium: 135 mmol/L (ref 135–145)
Total Bilirubin: 0.8 mg/dL (ref <1.2)
Total Protein: 7.7 g/dL (ref 6.5–8.1)

## 2023-08-17 LAB — C-REACTIVE PROTEIN: CRP: 0.8 mg/dL (ref <1.0)

## 2023-08-17 LAB — SEDIMENTATION RATE: Sed Rate: 5 mm/h (ref 0–20)

## 2023-08-17 MED ORDER — DOXYCYCLINE HYCLATE 100 MG PO TABS: 100.0000 mg | ORAL_TABLET | Freq: Two times a day (BID) | ORAL | 0 refills | Status: DC

## 2023-08-17 MED ORDER — AMOXICILLIN-POT CLAVULANATE 875-125 MG PO TABS: 1.0000 | ORAL_TABLET | Freq: Two times a day (BID) | ORAL | 0 refills | Status: DC

## 2023-08-17 NOTE — Patient Instructions (Signed)
  Mr. [name], you visited today to address your chronic foot ulcer on your right heel, which has been a persistent issue. We discussed the importance of managing your diabetes and the potential benefits of hyperbaric oxygen therapy for your ulcer. We also reviewed your general health maintenance and provided recommendations to help improve your overall well-being.  YOUR PLAN:  -DIABETIC FOOT ULCER: A diabetic foot ulcer is a sore that occurs on the foot of a person with diabetes, often due to poor circulation and nerve damage. To help manage this, continue your current antibiotic therapy to prevent infection, and we will monitor your inflammatory markers, kidney, and liver function through lab tests. You may be referred to a wound clinic for further evaluation and potential hyperbaric oxygen therapy. It's important to inspect your foot daily with a mirror and try to offload pressure from the foot as much as possible.  -DIABETES MELLITUS: Diabetes Mellitus is a condition where your blood sugar levels are too high. Your recent A1c level has increased to 8.6 from 6.3, indicating that your blood sugar control has worsened. It's crucial to work with your primary care provider to improve this. Avoid artificial sweeteners and increase your water intake to help manage your blood sugar levels.  -FOOT DROP: Foot drop is a condition where you have difficulty lifting the front part of your foot. You are currently using a brace for support, and no changes are recommended at this time.  -GENERAL HEALTH MAINTENANCE: Continue with physical activity as tolerated, but be mindful to offload pressure from your foot to prevent further complications.  INSTRUCTIONS:  Please follow up with the wound clinic for evaluation and potential hyperbaric oxygen therapy. Continue your current antibiotic therapy and monitor your foot daily. Work with your primary care provider to improve your glycemic control and avoid artificial  sweeteners. Increase your water intake and maintain physical activity as tolerated.

## 2023-08-17 NOTE — Telephone Encounter (Signed)
Pt dropped off paperwork to get filled out and will liek for [place] to reach out to him upon completion. Please Advise, Thanks  CB #: [phone]

## 2023-08-17 NOTE — Progress Notes (Signed)
NAME: [name]  DOB: 11/09/1965  MRN: [id number]  Date/Time: 08/17/2023 9:09 AM   Subjective:   ?pt gave consent to use AI scribe  [name] is a 57 y.o. with a history of DM. HTN DFI, rt foot  s/p TMA in 2017 Stump site ulceration  since 2019 [name], a patient with a history of diabetes, presents with a chronic foot ulcer on his right heel. The ulcer has been a persistent issue, leading to recommendations for amputation by other healthcare providers. Pt is managing this conservatively . The ulcer initially started as a small wound but has since grown larger. The patient reports that the ulcer will heal to a small size but never completely closes. 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 The patient also reports living alone and needing to work, which adds additional stress and may impact his ability to manage his health effectively. Pt was recnetly in the hosptial between 07/10/23-07/15/23 and MRI showed acute osteo o the rt foot He was again recommended BKA and he chose not to have it- He was seen by ID Dr. Daiva Eves and given  PO augmentin + Doxy for 6 weeks  HE is stable and doing okay .  Past Medical History:  Diagnosis Date   Acute prostatitis without hematuria 05/20/2022   Allergy    Atypical pneumonia 12/18/2022   Complication of anesthesia    pt states at [facility] he stopped breathing due to his sleep apnea-hard to wake up   COVID-19    Depression    Diabetic ulcer of both feet (HCC)    DM (diabetes mellitus), type 2 (HCC)    History of kidney stones    History of methicillin resistant staphylococcus aureus (MRSA) 09/2020   foot   Neuromuscular disorder (HCC)    neuropathy of back and bilateral feet   Obesity    Sleep apnea    does not use cpap    Past Surgical History:  Procedure Laterality Date   AMPUTATION Left    toe amputation   CYSTOSCOPY/URETEROSCOPY/HOLMIUM  LASER/STENT PLACEMENT Right 07/29/2022   Procedure: CYSTOSCOPY/URETEROSCOPY/HOLMIUM LASER/STENT PLACEMENT;  Surgeon: Sondra Come, MD;  Location: [facility] ORS;  Service: Urology;  Laterality: Right;   FOOT SURGERY Right 09/2012   cyst removed   LOWER EXTREMITY ANGIOGRAPHY Right 07/12/2023   Procedure: Lower Extremity Angiography;  Surgeon: Annice Needy, MD;  Location: [facility] INVASIVE CV LAB;  Service: Cardiovascular;  Laterality: Right;   SPINE SURGERY  2004   nerve damage in spine   TOE AMPUTATION Right 06/30/2016   5 toes    Social History   Socioeconomic History   Marital status: Legally Separated    Spouse name: Not on file   Number of children: Not on file   Years of education: Not on file   Highest education level: Bachelor's degree (e.g., BA, AB, BS)  Occupational History   Not on file  Tobacco Use   Smoking status: Never    Passive exposure: Never   Smokeless tobacco: Never  Vaping Use   Vaping status: Never Used  Substance and Sexual Activity   Alcohol use: Never   Drug use: Never   Sexual activity: Not Currently  Other Topics Concern   Not on file  Social History Narrative   Not on file   Social Drivers of Health   Financial Resource Strain: High Risk (08/04/2023)  Overall Financial Resource Strain (CARDIA)    Difficulty of Paying Living Expenses: Very hard  Food Insecurity: Food Insecurity Present (08/15/2023)   Hunger Vital Sign    Worried About Running Out of Food in the Last Year: Sometimes true    Ran Out of Food in the Last Year: Sometimes true  Transportation Needs: No Transportation Needs (08/15/2023)   PRAPARE - Administrator, Civil Service (Medical): No    Lack of Transportation (Non-Medical): No  Physical Activity: Sufficiently Active (08/04/2023)   Exercise Vital Sign    Days of Exercise per Week: 5 days    Minutes of Exercise per Session: 30 min  Stress: Stress Concern Present (08/04/2023)   [employer] of Occupational Health -  Occupational Stress Questionnaire    Feeling of Stress : To some extent  Social Connections: Socially Isolated (08/04/2023)   Social Connection and Isolation Panel [NHANES]    Frequency of Communication with Friends and Family: Never    Frequency of Social Gatherings with Friends and Family: Never    Attends Religious Services: Never    Database administrator or Organizations: No    Attends Engineer, structural: Not on file    Marital Status: Separated  Intimate Partner Violence: Not At Risk (08/15/2023)   Humiliation, Afraid, Rape, and Kick questionnaire    Fear of Current or Ex-Partner: No    Emotionally Abused: No    Physically Abused: No    Sexually Abused: No    Family History  Problem Relation Age of Onset   Heart disease Father    Stroke Father    Heart attack Father 6   Heart disease Maternal Grandmother    Stroke Maternal Grandmother    Heart disease Paternal Grandfather    Stroke Paternal Grandfather    No Known Allergies I? Current Outpatient Medications  Medication Sig Dispense Refill   amoxicillin-clavulanate (AUGMENTIN) 875-125 MG tablet Take 1 tablet by mouth 2 (two) times daily. 88 tablet 0   aspirin EC 81 MG tablet Take 81 mg by mouth daily. Swallow whole.     atorvastatin (LIPITOR) 20 MG tablet Take 1 tablet (20 mg total) by mouth daily. 90 tablet 3   Continuous Glucose Receiver (DEXCOM G7 RECEIVER) DEVI Check blood sugars at least 3 times a day 1 each 0   Continuous Glucose Sensor (DEXCOM G7 SENSOR) MISC Use 1 sensor every 10 days for 30 days. No refills. 3 each 0   doxycycline (VIBRA-TABS) 100 MG tablet Take 1 tablet (100 mg total) by mouth 2 (two) times daily. 88 tablet 0   metFORMIN (GLUCOPHAGE-XR) 500 MG 24 hr tablet TAKE 1 TABLET BY MOUTH EVERY DAY WITH BREAKFAST 90 tablet 1   Multiple Vitamins-Minerals (MULTIVITAMIN WITH MINERALS) tablet Take 1 tablet by mouth daily.     oxyCODONE (ROXICODONE) 5 MG immediate release tablet Take 1.5 tablets (7.5  mg total) by mouth daily. As needed for severe pain 45 tablet 0   SODIUM FLUORIDE 5000 SENSITIVE 1.1-5 % GEL Brush as directed by your provider.     tamsulosin (FLOMAX) 0.4 MG CAPS capsule Take 0.4 mg by mouth daily.     telmisartan (MICARDIS) 20 MG tablet TAKE 1 TABLET BY MOUTH EVERYDAY AT BEDTIME 90 tablet 0   No current facility-administered medications for this visit.     Abtx:  Anti-infectives (From admission, onward)    None       REVIEW OF SYSTEMS:  Const: negative fever, negative chills, negative weight  loss Eyes: negative diplopia or visual changes, negative eye pain ENT: negative coryza, negative sore throat Resp: negative cough, hemoptysis, dyspnea Cards: negative for chest pain, palpitations, lower extremity edema GU: negative for frequency, dysuria and hematuria GI: Negative for abdominal pain, diarrhea, bleeding, constipation Skin: negative for rash and pruritus Heme: negative for easy bruising and gum/nose bleeding MS:as above Neurolo:negative for headaches, dizziness, vertigo, memory problems  Psych: negative for feelings of anxiety, depression  Endocrine: negative for thyroid, diabetes Allergy/Immunology- negative for any medication or food allergies ?  Objective:  VITALS:  BP (!) 147/78   Pulse 82   Temp (!) 97.2 F (36.2 C) (Temporal)   Ht 5\' 10"  (1.778 m)   Wt 243 lb (110.2 kg)   SpO2 93%   BMI 34.87 kg/m    PHYSICAL EXAM:  General: Alert, cooperative, no distress, appears stated age.  Head: Normocephalic, without obvious abnormality, atraumatic. Eyes: Conjunctivae clear, anicteric sclerae. Pupils are equal ENT Nares normal. No drainage or sinus tenderness. Lips, mucosa, and tongue normal. No Thrush Neck: Supple, symmetrical, no adenopathy, thyroid: non tender no carotid bruit and no JVD. Back: No CVA tenderness. Lungs: Clear to auscultation bilaterally. No Wheezing or Rhonchi. No rales. Heart: Regular rate and rhythm, no murmur, rub or  gallop. Abdomen: Soft, non-tender,not distended. Bowel sounds normal. No masses Extremities: rt TMA Mid foot ulceration. Clean base  Skin: No rashes or lesions. Or bruising Lymph: Cervical, supraclavicular normal. Neurologic: Grossly non-focal Pertinent Labs Lab Results CBC    Component Value Date/Time   WBC 7.4 07/15/2023 0417   RBC 4.69 07/15/2023 0417   HGB 13.8 07/15/2023 0417   HCT 40.8 07/15/2023 0417   PLT 287 07/15/2023 0417   MCV 87.0 07/15/2023 0417   MCH 29.4 07/15/2023 0417   MCHC 33.8 07/15/2023 0417   RDW 14.6 07/15/2023 0417   LYMPHSABS 1.3 07/10/2023 2144   MONOABS 1.0 07/10/2023 2144   EOSABS 0.3 07/10/2023 2144   BASOSABS 0.2 (H) 07/10/2023 2144       Latest Ref Rng & Units 07/15/2023    4:17 AM 07/14/2023    3:27 AM 07/13/2023    5:20 AM  CMP  Glucose 70 - 99 mg/dL 811  914  782   BUN 6 - 20 mg/dL 12  12  13    Creatinine 0.61 - 1.24 mg/dL 9.56  2.13  0.86   Sodium 135 - 145 mmol/L 136  136  136   Potassium 3.5 - 5.1 mmol/L 3.8  4.0  4.6   Chloride 98 - 111 mmol/L 103  105  103   CO2 22 - 32 mmol/L 25  22  24    Calcium 8.9 - 10.3 mg/dL 8.2  8.2  8.2       Microbiology: No results found for this or any previous visit (from the past 240 hours).  IMAGING RESULTS: 07/11/23  Plantar ulceration of the medial aspect of the foot at the level of the medial cuneiform, extending to bone. Acute osteomyelitis of the medial navicular, the cuneiforms, and the residual second and third metatarsal bases. No abscess. I have personally reviewed the films ? Impression/Recommendation ?Diabetic Foot Ulcer Chronic right heel ulcer, previously recommended for amputation. Patient is ambulatory for work, contributing to pressure on the wound. Discussed the importance of offloading pressure, maintaining good glycemic control, and possibly exploring hyperbaric oxygen therapy. -Continue current antibiotic therapy doxy+ augmentin  for a ttaol of 10-12 weeks . -Order  labs to monitor inflammatory markers, kidney and liver  function. -Refer to wound clinic for evaluation and potential hyperbaric oxygen therapy. -Advise daily foot inspection with a mirror to monitor wound progression. -Encourage offloading pressure from the foot as much as possible.  Diabetes Mellitus Recent A1c of 8.6, up from previous 6.3. Discussed the importance of good glycemic control in wound healing. -Work with primary care provider to improve glycemic control. -Encourage avoidance of artificial sweeteners and increased water intake.  Foot Drop Patient uses a brace for support. -No changes recommended at this time.  General Health Maintenance -Encourage continued physical activity as tolerated, with attention to offloading pressure from the foot. ? ?Discussed the management with his PCP and Podiatrist Dr.Cline He is s DrCline after my visit and he will refer to wound clinic after his assessment  ________________________________________________ Discussed with patient in detail Follow up 1 month

## 2023-08-18 ENCOUNTER — Telehealth: Payer: Self-pay

## 2023-08-18 NOTE — Telephone Encounter (Signed)
FMLA paperwork has been placed in red folder.  ?

## 2023-08-18 NOTE — Telephone Encounter (Signed)
I attempted to reach the patient. No and answer LVM for him to call back. [name] [name], CMA

## 2023-08-18 NOTE — Telephone Encounter (Signed)
-----   Message from Lynn Ito sent at 08/17/2023 10:43 PM EST ----- Please let him that labs look okay except for blood sugar. Did he see Podiatrist today and what did he say? thx ----- Message ----- From: Leory Plowman, Lab [facility] [place] Sent: 08/17/2023  10:43 AM EST To: Lynn Ito, MD

## 2023-08-19 ENCOUNTER — Other Ambulatory Visit: Payer: Self-pay | Admitting: Internal Medicine

## 2023-08-21 NOTE — Telephone Encounter (Signed)
Called patient, no answer. Left HIPAA compliant voicemail requesting callback.   Redell Bhandari D Laraine Samet, RN  

## 2023-08-23 MED ORDER — OXYCODONE HCL 5 MG PO TABS: 7.5000 mg | ORAL_TABLET | Freq: Every day | ORAL | 0 refills | Status: DC

## 2023-08-24 NOTE — Telephone Encounter (Signed)
Spoke with pt and advised him that it would be best for him to schedule an appointment with Dr. Darrick Huntsman to discuss the need for the [facility] paperwork. Pt gave a verbal understanding. Pt is scheduled for 09/01/2023 at 4:30.

## 2023-08-29 ENCOUNTER — Ambulatory Visit: Payer: Self-pay | Admitting: *Deleted

## 2023-08-29 ENCOUNTER — Telehealth: Payer: Self-pay | Admitting: *Deleted

## 2023-08-29 NOTE — Patient Instructions (Signed)
 Visit Information  Thank you for taking time to visit with me today. Please don't hesitate to contact me if I can be of assistance to you.   Following are the goals we discussed today:   Goals Addressed             This Visit's Progress    Adjustment to medical condtion       Activities and task to complete in order to accomplish goals.   EMOTIONAL / MENTAL HEALTH SUPPORT Self Support options  (continue to educate self on current medical condition and treatment options, continue to consider all options for follow up care , keep all scheudled appointments discussed, continue to consider ongoing mental health options for additional support )         Our next appointment is by telephone on 09/28/23 at 1pm  Please call the care guide team at [phone] if you need to cancel or reschedule your appointment.   If you are experiencing a Mental Health or Behavioral Health Crisis or need someone to talk to, please call 911   Patient verbalizes understanding of instructions and care plan provided today and agrees to view in MyChart. Active MyChart status and patient understanding of how to access instructions and care plan via MyChart confirmed with patient.     Telephone follow up appointment with care management team member scheduled for: 09/28/23  Lenn Mean, LCSW [place]  [facility], [facility] Licensed Clinical Social Worker Care Coordinator  Direct Dial : [phone]

## 2023-08-29 NOTE — Patient Outreach (Signed)
  Care Coordination   08/29/2023 Name: [name] MRN: [id number] DOB: 1966-01-13   Care Coordination Outreach Attempts:  An unsuccessful telephone outreach was attempted today to offer the patient information about available complex care management services.  Follow Up Plan:  Additional outreach attempts will be made to offer the patient complex care management information and services.   Encounter Outcome:  No Answer   Care Coordination Interventions:  No, not indicated    Pacey Willadsen, LCSW [place]  [facility], [facility] Licensed Clinical Social Worker Care Coordinator  Direct Dial : [phone]

## 2023-08-29 NOTE — Patient Outreach (Signed)
  Care Coordination   Follow Up Visit Note   08/29/2023 Name: ETHERIDGE GEIL MRN: [id number] DOB: [birth year]  BERNICE MULLIN is a 57 y.o. year old male who sees Marylynn, Verneita CROME, MD for primary care. I spoke with  Glean JAYSON Moats by phone today.  What matters to the patients health and wellness today?  Patient continues to discuss management of his medical condition. Confirms that he has been referred to [facility] care management program as well for disease management. Anticipated care needs and financial challenges continues to be discussed.  Assistance received with rent, patient has appointment next week with the Disability office to discuss eligibility.   Goals Addressed             This Visit's Progress    Adjustment to medical condtion       Activities and task to complete in order to accomplish goals.   EMOTIONAL / MENTAL HEALTH SUPPORT Self Support options  (continue to educate self on current medical condition and treatment options, continue to consider all options for follow up care , keep all scheudled appointments discussed, continue to consider ongoing mental health options for additional support )         SDOH assessments and interventions completed:  No     Care Coordination Interventions:  Yes, provided  Interventions Today    Flowsheet Row Most Recent Value  Chronic Disease   Chronic disease during today's visit Diabetes  General Interventions   General Interventions Discussed/Reviewed General Interventions Reviewed, [employer] assessed for additional cmmunity resource needs-wants to avoid amputation-spouse assisted with rent payment-pt referred to [facility] care management program-initial appt scheduled for 08/30/24-continues to work daily-uses a orthopedic insert and brace]  Doctor Visits Discussed/Reviewed Doctor Visits Reviewed, PCP  [PCP 09/01/23-plans to discuss FMLA]  Exercise Interventions   Exercise Discussed/Reviewed Exercise Discussed   [confirmed that patient works out 5 days per week]  Mental Health Interventions   Mental Health Discussed/Reviewed Mental Health Reviewed, Coping Strategies, Depression  [continues t declined need for ongoig mental health support-confirmed that pt copes with depression sympotms by keeping busy and  maintaing a daily work-out schedule-emotional support continues to be provided]  Nutrition Interventions   Nutrition Discussed/Reviewed Nutrition Discussed  [continues to [employer] bank]       Follow up plan: Follow up call scheduled for 09/28/23    Encounter Outcome:  Patient Visit Completed

## 2023-08-30 DIAGNOSIS — Z0279 Encounter for issue of other medical certificate: Secondary | ICD-10-CM

## 2023-09-01 ENCOUNTER — Encounter: Payer: Self-pay | Admitting: Internal Medicine

## 2023-09-01 ENCOUNTER — Ambulatory Visit (INDEPENDENT_AMBULATORY_CARE_PROVIDER_SITE_OTHER): Payer: 59 | Admitting: Internal Medicine

## 2023-09-01 VITALS — Ht 70.0 in | Wt 225.0 lb

## 2023-09-01 DIAGNOSIS — E119 Type 2 diabetes mellitus without complications: Secondary | ICD-10-CM

## 2023-09-01 DIAGNOSIS — M86671 Other chronic osteomyelitis, right ankle and foot: Secondary | ICD-10-CM | POA: Diagnosis not present

## 2023-09-01 DIAGNOSIS — Z7984 Long term (current) use of oral hypoglycemic drugs: Secondary | ICD-10-CM

## 2023-09-02 MED ORDER — TELMISARTAN 20 MG PO TABS: ORAL_TABLET | ORAL | 1 refills | Status: DC

## 2023-09-02 NOTE — Assessment & Plan Note (Addendum)
 He has deferred the recommended amputation and chose long ter antibiotics and wound care, he has been referred y podiatry to wound care for hyperbaric oxygen.  He is unable to offload his foot due to work restrictions .   His FMLA needs are unclear but monthly visits to ID , podiatry and weekly visits to wound care

## 2023-09-02 NOTE — Progress Notes (Signed)
 Telephone Visit Note   This format is felt to be most appropriate for this patient at this time.  All issues noted in this document were discussed and addressed.  No physical exam was performed (except for noted visual exam findings with Video Visits).   I attempted to connect  with Glean  on 09/01/23 at  4:30 PM EST by a video enabled telemedicine application  and verified that I am speaking with the correct person using two identifiers. Location patient: home Location provider: work or home office Persons participating in the virtual visit: patient, provider  I discussed the limitations, risks, security and privacy concerns of performing an evaluation and management service by telephone and the availability of in person appointments. I also discussed with the patient that there may be a patient responsible charge related to this service. The patient expressed understanding and agreed to proceed.  Interactive audio and video telecommunications were attempted between this provider and patient, however failed, due to patient having technical difficulties .  We continued and completed visit with audio only.   Reason for visit: FMLA    HPI:  Tayon is a 58 yr old  male with  history of recurrent pressure ulcers of the foot resulting in osteomyelitis and metatarsal amputations  who was diagnosed during November admission with chronic osteomyelitis of the foot . Admission films noted multiple lucencies in the bones of the midfoot and hind foot.  Empriric antibiotics were started, (Vancomycin  and  Unasyn )   He was seen by ID,   His vascular evaluation was normal by angiography,   but due to the extent of the infection and the chronicity of his condition,  he was advised to have an  amputation of the entire foot.  He declined amputation and was discharged home on augmentin  and doxycycline  with advice to follow up with podiatry and ID.    He returned to work at the [employer] the following Monday .  He is not  using an ankle scooter to offload his wound because he has been advised privately by a coworker  that his request would result in termination   He had an appt with Dr Neill on Dec 19  and was referred to Wound Center for consideratiion  of hyperbaric oxygen  Hae was seen by Dr. Fayette , Infectious Disease  and schedule to return in one month   2) Type 2 DM :  at hospital follow up he was advised that his diabetes,which had been previously well  managed by diet ,  had become uncontrolled and he was advised to resume use of CBG monitor to facilitate safe prescription of hypoglycemic medications. He has not done so and has not been checking his sugars..    ROS: See pertinent positives and negatives per HPI.  Past Medical History:  Diagnosis Date   Acute prostatitis without hematuria 05/20/2022   Allergy    Atypical pneumonia 12/18/2022   Complication of anesthesia    pt states at [facility] he stopped breathing due to his sleep apnea-hard to wake up   COVID-19    Depression    Diabetic ulcer of both feet (HCC)    DM (diabetes mellitus), type 2 (HCC)    History of kidney stones    History of methicillin resistant staphylococcus aureus (MRSA) 09/2020   foot   Neuromuscular disorder (HCC)    neuropathy of back and bilateral feet   Obesity    Sleep apnea    does not use cpap  Past Surgical History:  Procedure Laterality Date   AMPUTATION Left    toe amputation   CYSTOSCOPY/URETEROSCOPY/HOLMIUM LASER/STENT PLACEMENT Right 07/29/2022   Procedure: CYSTOSCOPY/URETEROSCOPY/HOLMIUM LASER/STENT PLACEMENT;  Surgeon: Francisca Redell BROCKS, MD;  Location: [facility] ORS;  Service: Urology;  Laterality: Right;   FOOT SURGERY Right 09/2012   cyst removed   LOWER EXTREMITY ANGIOGRAPHY Right 07/12/2023   Procedure: Lower Extremity Angiography;  Surgeon: Marea Selinda RAMAN, MD;  Location: [facility] INVASIVE CV LAB;  Service: Cardiovascular;  Laterality: Right;   SPINE SURGERY  2004   nerve damage in spine   TOE  AMPUTATION Right 06/30/2016   5 toes    Family History  Problem Relation Age of Onset   Heart disease Father    Stroke Father    Heart attack Father 39   Heart disease Maternal Grandmother    Stroke Maternal Grandmother    Heart disease Paternal Grandfather    Stroke Paternal Grandfather     SOCIAL HX:  reports that he has never smoked. He has never been exposed to tobacco smoke. He has never used smokeless tobacco. He reports that he does not drink alcohol and does not use drugs.    Current Outpatient Medications:    amoxicillin -clavulanate (AUGMENTIN ) 875-125 MG tablet, Take 1 tablet by mouth 2 (two) times daily., Disp: 60 tablet, Rfl: 0   aspirin  EC 81 MG tablet, Take 81 mg by mouth daily. Swallow whole., Disp: , Rfl:    atorvastatin  (LIPITOR) 20 MG tablet, Take 1 tablet (20 mg total) by mouth daily., Disp: 90 tablet, Rfl: 3   Continuous Glucose Receiver (DEXCOM G7 RECEIVER) DEVI, Check blood sugars at least 3 times a day, Disp: 1 each, Rfl: 0   Continuous Glucose Sensor (DEXCOM G7 SENSOR) MISC, Use 1 sensor every 10 days for 30 days. No refills., Disp: 3 each, Rfl: 0   doxycycline  (VIBRA -TABS) 100 MG tablet, Take 1 tablet (100 mg total) by mouth 2 (two) times daily., Disp: 60 tablet, Rfl: 0   metFORMIN  (GLUCOPHAGE -XR) 500 MG 24 hr tablet, TAKE 1 TABLET BY MOUTH EVERY DAY WITH BREAKFAST, Disp: 90 tablet, Rfl: 1   Multiple Vitamins-Minerals (MULTIVITAMIN WITH MINERALS) tablet, Take 1 tablet by mouth daily., Disp: , Rfl:    oxyCODONE  (ROXICODONE ) 5 MG immediate release tablet, Take 1.5 tablets (7.5 mg total) by mouth daily. As needed for severe pain, Disp: 45 tablet, Rfl: 0   SODIUM FLUORIDE 5000 SENSITIVE 1.1-5 % GEL, Brush as directed by your provider., Disp: , Rfl:    tamsulosin  (FLOMAX ) 0.4 MG CAPS capsule, Take 0.4 mg by mouth daily., Disp: , Rfl:    telmisartan  (MICARDIS ) 20 MG tablet, TAKE 1 TABLET BY MOUTH EVERYDAY AT BEDTIME, Disp: 90 tablet, Rfl: 0  EXAM:  VITALS per  patient if applicable:  General appearance: alert, cooperative and articulate.  No signs of being in distress  Lungs: not short of breath ,  No cough, speaking in full sentences  Psych: affect normal,dspeech is articulate and non pressured .  Denies suicidal thoughts     ASSESSMENT AND PLAN: Chronic refractory osteomyelitis of right foot (HCC) Assessment & Plan: He has deferred the recommended amputation and chose long ter antibiotics and wound care, he has been referred y podiatry to wound care for hyperbaric oxygen.  He is unable to offload his foot due to work restrictions .   His FMLA needs are unclear but monthly visits to ID , podiatry and weekly visits to wound care     Diabetes  mellitus treated with oral medication [facility]) Assessment & Plan: He has been nonadherent to diet for several months due to transitioning from being married to being estranged from wife ,  and has not put on his CBG to monitor sugars.  Return in 2 weeks for medication adjustment.   Continue atorvastatin , telmisartan  and metformin     Lab Results  Component Value Date   HGBA1C 8.6 (H) 07/11/2023   Lab Results  Component Value Date   LABMICR See below: 08/03/2022   MICROALBUR 1.2 10/04/2022   MICROALBUR 3.2 (H) 02/22/2022            I discussed the assessment and treatment plan with the patient. The patient was provided an opportunity to ask questions and all were answered. The patient agreed with the plan and demonstrated an understanding of the instructions.   The patient was advised to call back or seek an in-person evaluation if the symptoms worsen or if the condition fails to improve as anticipated.   I spent 22  minutes dedicated to the care of this patient on the date of this telephone encounter to include pre-visit review of his medical history,  non  Face-to-face time with the patient , and post visit ordering of testing and therapeutics.    Verneita LITTIE Kettering, MD

## 2023-09-02 NOTE — Assessment & Plan Note (Signed)
 He has been nonadherent to diet for several months due to transitioning from being married to being estranged from wife ,  and has not put on his CBG to monitor sugars.  Return in 2 weeks for medication adjustment.   Continue atorvastatin , telmisartan  and metformin     Lab Results  Component Value Date   HGBA1C 8.6 (H) 07/11/2023   Lab Results  Component Value Date   LABMICR See below: 08/03/2022   MICROALBUR 1.2 10/04/2022   MICROALBUR 3.2 (H) 02/22/2022

## 2023-09-03 ENCOUNTER — Encounter: Payer: Self-pay | Admitting: Infectious Diseases

## 2023-09-03 ENCOUNTER — Encounter: Payer: Self-pay | Admitting: Internal Medicine

## 2023-09-19 ENCOUNTER — Other Ambulatory Visit: Payer: Self-pay | Admitting: Internal Medicine

## 2023-09-19 ENCOUNTER — Other Ambulatory Visit: Payer: Self-pay | Admitting: Infectious Diseases

## 2023-09-19 MED ORDER — DOXYCYCLINE HYCLATE 100 MG PO TABS: 100.0000 mg | ORAL_TABLET | Freq: Two times a day (BID) | ORAL | 1 refills | Status: AC

## 2023-09-19 MED ORDER — AMOXICILLIN-POT CLAVULANATE 875-125 MG PO TABS: 1.0000 | ORAL_TABLET | Freq: Two times a day (BID) | ORAL | 1 refills | Status: AC

## 2023-09-21 ENCOUNTER — Ambulatory Visit: Payer: 59 | Attending: Infectious Diseases | Admitting: Infectious Diseases

## 2023-09-21 ENCOUNTER — Encounter: Payer: Self-pay | Admitting: Infectious Diseases

## 2023-09-21 VITALS — BP 135/74 | HR 76 | Temp 97.5°F | Ht 70.0 in | Wt 233.0 lb

## 2023-09-21 DIAGNOSIS — E1142 Type 2 diabetes mellitus with diabetic polyneuropathy: Secondary | ICD-10-CM | POA: Diagnosis not present

## 2023-09-21 DIAGNOSIS — Z7984 Long term (current) use of oral hypoglycemic drugs: Secondary | ICD-10-CM | POA: Diagnosis not present

## 2023-09-21 DIAGNOSIS — L89893 Pressure ulcer of other site, stage 3: Secondary | ICD-10-CM

## 2023-09-21 DIAGNOSIS — L089 Local infection of the skin and subcutaneous tissue, unspecified: Secondary | ICD-10-CM | POA: Diagnosis not present

## 2023-09-21 DIAGNOSIS — E11628 Type 2 diabetes mellitus with other skin complications: Secondary | ICD-10-CM

## 2023-09-21 DIAGNOSIS — E11621 Type 2 diabetes mellitus with foot ulcer: Secondary | ICD-10-CM | POA: Insufficient documentation

## 2023-09-21 DIAGNOSIS — Z5986 Financial insecurity: Secondary | ICD-10-CM | POA: Diagnosis not present

## 2023-09-21 DIAGNOSIS — M21379 Foot drop, unspecified foot: Secondary | ICD-10-CM | POA: Insufficient documentation

## 2023-09-21 DIAGNOSIS — I10 Essential (primary) hypertension: Secondary | ICD-10-CM | POA: Insufficient documentation

## 2023-09-21 DIAGNOSIS — L97519 Non-pressure chronic ulcer of other part of right foot with unspecified severity: Secondary | ICD-10-CM

## 2023-09-21 DIAGNOSIS — Z79899 Other long term (current) drug therapy: Secondary | ICD-10-CM | POA: Diagnosis not present

## 2023-09-21 DIAGNOSIS — L97419 Non-pressure chronic ulcer of right heel and midfoot with unspecified severity: Secondary | ICD-10-CM | POA: Diagnosis present

## 2023-09-21 NOTE — Progress Notes (Signed)
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 with a history of DM. HTN DFI, rt foot  s/p TMA in 2017 Stump site ulceration  since 2019 [name], a patient with a history of diabetes, presents with a chronic foot ulcer on his right heel. The ulcer has been a persistent issue, leading to recommendations for amputation by other healthcare providers. Pt is managing this conservatively . The ulcer initially started as a small wound but has since grown larger. The patient reports that the ulcer will heal to a small size but never completely closes. 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 The patient also reports living alone and needing to work, which adds additional stress and may impact his ability to manage his health effectively. Pt was recently in the hosptial between 07/10/23-07/15/23 and MRI showed acute osteo o the rt foot He was again recommended BKA and he chose not to have it- He was seen by ID Dr. Daiva Eves and given  PO augmentin + Doxy for 6 weeks  I saw him on 08/17/23 in my office and decided to continue the antibiotics for a total of 12 weeks Pt says over the weekend he eats a lot   Past Medical History:  Diagnosis Date   Acute prostatitis without hematuria 05/20/2022   Allergy    Atypical pneumonia 12/18/2022   Complication of anesthesia    pt states at [facility] he stopped breathing due to his sleep apnea-hard to wake up   COVID-19    Depression    Diabetic ulcer of both feet (HCC)    DM (diabetes mellitus), type 2 (HCC)    History of kidney stones    History of methicillin resistant  staphylococcus aureus (MRSA) 09/2020   foot   Neuromuscular disorder (HCC)    neuropathy of back and bilateral feet   Obesity    Sleep apnea    does not use cpap    Past Surgical History:  Procedure Laterality Date   AMPUTATION Left    toe amputation   CYSTOSCOPY/URETEROSCOPY/HOLMIUM LASER/STENT PLACEMENT Right 07/29/2022   Procedure: CYSTOSCOPY/URETEROSCOPY/HOLMIUM LASER/STENT PLACEMENT;  Surgeon: Sondra Come, MD;  Location: [facility] ORS;  Service: Urology;  Laterality: Right;   FOOT SURGERY Right 09/2012   cyst removed   LOWER EXTREMITY ANGIOGRAPHY Right 07/12/2023   Procedure: Lower Extremity Angiography;  Surgeon: Annice Needy, MD;  Location: [facility] INVASIVE CV LAB;  Service: Cardiovascular;  Laterality: Right;   SPINE SURGERY  2004   nerve damage in spine   TOE AMPUTATION Right 06/30/2016   5 toes    Social History   Socioeconomic History   Marital status: Legally Separated    Spouse name: Not on file   Number of children: Not on file   Years of education: Not on file   Highest education level: Bachelor's degree (e.g., BA, AB, BS)  Occupational History   Not on file  Tobacco Use   Smoking status: Never    Passive exposure: Never   Smokeless tobacco: Never  Vaping Use   Vaping status: Never Used  Substance and Sexual Activity   Alcohol use: Never   Drug use: Never   Sexual activity: Not Currently  Other Topics Concern   Not on file  Social History Narrative   Not on file   Social Drivers of Health   Financial Resource Strain: High Risk (09/08/2023)   Received from [facility]   Overall Financial Resource Strain (CARDIA)    Difficulty of Paying Living Expenses: Very hard  Food Insecurity: Food Insecurity Present (09/08/2023)   Received from [facility]   Hunger Vital Sign    Worried About Running Out of Food in the Last Year: Sometimes true    Ran Out of Food in the Last Year: Sometimes true  Transportation Needs: No  Transportation Needs (09/08/2023)   Received from [facility] - Transportation    In the past 12 months, has lack of transportation kept you from medical appointments or from getting medications?: No    Lack of Transportation (Non-Medical): No  Physical Activity: Sufficiently Active (09/08/2023)   Received from [facility]   Exercise Vital Sign    Days of Exercise per Week: 5 days    Minutes of Exercise per Session: 30 min  Stress: Stress Concern Present (09/08/2023)   Received from [facility] - Occupational Stress Questionnaire    Feeling of Stress : To some extent  Social Connections: Socially Isolated (09/08/2023)   Received from [facility]   Social Connection and Isolation Panel [NHANES]    Frequency of Communication with Friends and Family: Once a week    Frequency of Social Gatherings with Friends and Family: Never    Attends Religious Services: Never    Database administrator or Organizations: No    Attends Banker Meetings: Never    Marital Status: Separated  Intimate Partner Violence: Not At Risk (08/15/2023)   Humiliation, Afraid, Rape, and Kick questionnaire    Fear of Current or Ex-Partner: No    Emotionally Abused: No    Physically Abused: No    Sexually Abused: No    Family History  Problem Relation Age of Onset   Heart disease Father    Stroke Father    Heart attack Father 73   Heart disease Maternal Grandmother    Stroke Maternal Grandmother    Heart disease Paternal Grandfather    Stroke Paternal Grandfather    No Known Allergies I? Current Outpatient Medications  Medication Sig Dispense Refill   amoxicillin-clavulanate (AUGMENTIN) 875-125 MG tablet Take 1 tablet by mouth 2 (two) times daily. 60 tablet 1   aspirin EC 81 MG tablet Take 81 mg by mouth daily. Swallow whole.     atorvastatin (LIPITOR) 20 MG tablet Take 1  tablet (20 mg total) by mouth daily. 90 tablet 3   Continuous Glucose Receiver (DEXCOM G7 RECEIVER) DEVI Check blood sugars at least 3 times a day 1 each 0   Continuous Glucose Sensor (DEXCOM G7 SENSOR) MISC Use 1 sensor every 10 days for 30 days. No refills. 3 each 0   doxycycline (VIBRA-TABS) 100 MG tablet Take 1 tablet (100 mg total) by mouth 2 (two) times daily. 60 tablet 1   metFORMIN (GLUCOPHAGE-XR) 500 MG 24 hr tablet TAKE 1 TABLET BY MOUTH EVERY DAY WITH BREAKFAST 90 tablet 1   Multiple Vitamins-Minerals (MULTIVITAMIN WITH MINERALS) tablet Take 1 tablet by mouth daily.  oxyCODONE (ROXICODONE) 5 MG immediate release tablet Take 1.5 tablets (7.5 mg total) by mouth daily. As needed for severe pain 45 tablet 0   SODIUM FLUORIDE 5000 SENSITIVE 1.1-5 % GEL Brush as directed by your provider.     tamsulosin (FLOMAX) 0.4 MG CAPS capsule Take 0.4 mg by mouth daily.     telmisartan (MICARDIS) 20 MG tablet TAKE 1 TABLET BY MOUTH EVERYDAY AT BEDTIME 90 tablet 1   No current facility-administered medications for this visit.     Abtx:  Anti-infectives (From admission, onward)    None       REVIEW OF SYSTEMS:  Const: negative fever, negative chills, negative weight loss Eyes: negative diplopia or visual changes, negative eye pain ENT: negative coryza, negative sore throat Resp: negative cough, hemoptysis, dyspnea Cards: negative for chest pain, palpitations, lower extremity edema GU: negative for frequency, dysuria and hematuria GI: Negative for abdominal pain, diarrhea, bleeding, constipation Skin: negative for rash and pruritus Heme: negative for easy bruising and gum/nose bleeding MS:as above Neurolo:negative for headaches, dizziness, vertigo, memory problems  Psych: negative for feelings of anxiety, depression  Endocrine: negative for thyroid, diabetes Allergy/Immunology- negative for any medication or food allergies ?  Objective:  VITALS:  BP 135/74   Pulse 76   Temp (!)  97.5 F (36.4 C) (Temporal)   Ht 5\' 10"  (1.778 m)   Wt 233 lb (105.7 kg)   BMI 33.43 kg/m    PHYSICAL EXAM:  General: Alert, cooperative, no distress, appears stated age.  Lungs: Clear to auscultation bilaterally. No Wheezing or Rhonchi. No rales. Heart: Regular rate and rhythm, no murmur, rub or gallop. Abdomen: not examined Extremities: rt TMA Mid foot ulceration. Clean base   Skin: No rashes or lesions. Or bruising Lymph: Cervical, supraclavicular normal. Neurologic: Grossly non-focal Pertinent Labs Lab Results CBC    Component Value Date/Time   WBC 7.0 08/17/2023 1021   RBC 5.08 08/17/2023 1021   HGB 15.1 08/17/2023 1021   HCT 45.5 08/17/2023 1021   PLT 204 08/17/2023 1021   MCV 89.6 08/17/2023 1021   MCH 29.7 08/17/2023 1021   MCHC 33.2 08/17/2023 1021   RDW 14.4 08/17/2023 1021   LYMPHSABS 1.7 08/17/2023 1021   MONOABS 1.2 (H) 08/17/2023 1021   EOSABS 0.4 08/17/2023 1021   BASOSABS 0.2 (H) 08/17/2023 1021       Latest Ref Rng & Units 08/17/2023   10:21 AM 07/15/2023    4:17 AM 07/14/2023    3:27 AM  CMP  Glucose 70 - 99 mg/dL 956  213  086   BUN 6 - 20 mg/dL 18  12  12    Creatinine 0.61 - 1.24 mg/dL 5.78  4.69  6.29   Sodium 135 - 145 mmol/L 135  136  136   Potassium 3.5 - 5.1 mmol/L 4.4  3.8  4.0   Chloride 98 - 111 mmol/L 101  103  105   CO2 22 - 32 mmol/L 24  25  22    Calcium 8.9 - 10.3 mg/dL 8.6  8.2  8.2   Total Protein 6.5 - 8.1 g/dL 7.7     Total Bilirubin <1.2 mg/dL 0.8     Alkaline Phos 38 - 126 U/L 52     AST 15 - 41 U/L 31     ALT 0 - 44 U/L 32         IMAGING RESULTS: 07/11/23  Plantar ulceration of the medial aspect of the foot at the level of the  medial cuneiform, extending to bone. Acute osteomyelitis of the medial navicular, the cuneiforms, and the residual second and third metatarsal bases. No abscess. I have personally reviewed the films ? Impression/Recommendation ?Diabetic Foot Ulcer Chronic right mid foot  ulcer,  previously recommended for amputation. Patient is ambulatory for work, contributing to pressure on the wound. Discussed the importance of offloading pressure, maintaining good glycemic control, and possibly exploring hyperbaric oxygen therapy. -Continue current antibiotic therapy doxy+ augmentin  for a ttaol of -12 weeks .( 3 more weeks) ESR and CRP n  Advise daily foot inspection with a mirror to monitor wound progression. -Encourage offloading pressure from the foot as much as possible.  Diabetes Mellitus Recent A1c of 8.6, up from previous 6.3. Discussed the importance of good glycemic control in wound healing. -Work with primary care provider to improve glycemic control. -Encourage avoidance of artificial sweeteners and increased water intake.  Foot Drop Patient uses a brace for support. -No changes recommended at this time.  General Health Maintenance -Encourage continued physical activity as tolerated, with attention to offloading pressure from the foot. ? ?Discussed the management with patient in detail   ________________________________________________ Follow up PRN

## 2023-09-21 NOTE — Telephone Encounter (Signed)
Pt stopped by the office this morning and I gave him the sample reader. Pt stated that if he had questions getting it connected to the reader he would give [place] a call.

## 2023-09-21 NOTE — Patient Instructions (Signed)
Continue doxy and augmentin for 2 more weeks Follow up prn

## 2023-09-22 MED ORDER — OXYCODONE HCL 5 MG PO TABS: 7.5000 mg | ORAL_TABLET | Freq: Every day | ORAL | 0 refills | Status: DC

## 2023-09-28 ENCOUNTER — Ambulatory Visit: Payer: Self-pay | Admitting: *Deleted

## 2023-09-28 NOTE — Patient Outreach (Signed)
  Care Coordination   09/28/2023 Name: [name] MRN: [id number] DOB: 1966-02-17   Care Coordination Outreach Attempts:  An unsuccessful telephone outreach was attempted today to offer the patient information about available complex care management services.  Follow Up Plan:  Additional outreach attempts will be made to offer the patient complex care management information and services.   Encounter Outcome:  No Answer   Care Coordination Interventions:  No, not indicated    Yeila Morro, LCSW [place]  [facility], [facility] Licensed Clinical Social Worker Care Coordinator  Direct Dial: [phone]

## 2023-10-07 ENCOUNTER — Encounter: Payer: Self-pay | Admitting: Internal Medicine

## 2023-10-07 ENCOUNTER — Encounter: Payer: Self-pay | Admitting: Infectious Diseases

## 2023-10-07 DIAGNOSIS — E1169 Type 2 diabetes mellitus with other specified complication: Secondary | ICD-10-CM

## 2023-10-07 DIAGNOSIS — I10 Essential (primary) hypertension: Secondary | ICD-10-CM

## 2023-10-07 DIAGNOSIS — E1165 Type 2 diabetes mellitus with hyperglycemia: Secondary | ICD-10-CM

## 2023-10-07 DIAGNOSIS — R5383 Other fatigue: Secondary | ICD-10-CM

## 2023-10-09 ENCOUNTER — Telehealth: Payer: Self-pay

## 2023-10-09 NOTE — Telephone Encounter (Signed)
 Does pt need to go ahead and have the blood work done or does he need to have it done a few days before his next appt on 11/29/2023. His last labs were done in our office on 12/29/2022 but his last A1c was done in the hospital on 07/11/2023.

## 2023-10-09 NOTE — Telephone Encounter (Signed)
 I have pended future labs for your approval.

## 2023-10-09 NOTE — Telephone Encounter (Signed)
 Copied from CRM [id number]. Topic: Clinical - Request for Lab/Test Order >> Oct 09, 2023 11:38 AM [name] wrote: Reason for CRM: Patient wants to confirm how soon before his appointment with Dr.Tullo does he need to complete his labs, would also like to confirm if he needs to be fasting. Patient states its ok to leave Griffith voicemail.

## 2023-10-10 NOTE — Telephone Encounter (Signed)
Spoke with pt and scheduled his lab appt.

## 2023-10-29 ENCOUNTER — Encounter: Payer: Self-pay | Admitting: Emergency Medicine

## 2023-10-29 ENCOUNTER — Ambulatory Visit
Admission: EM | Admit: 2023-10-29 | Discharge: 2023-10-29 | Disposition: A | Discharge status: Home / Self Care | Attending: Emergency Medicine | Admitting: Emergency Medicine

## 2023-10-29 DIAGNOSIS — M778 Other enthesopathies, not elsewhere classified: Secondary | ICD-10-CM

## 2023-10-29 DIAGNOSIS — M7522 Bicipital tendinitis, left shoulder: Secondary | ICD-10-CM

## 2023-10-29 MED ORDER — METHYLPREDNISOLONE 4 MG PO TBPK: ORAL_TABLET | Freq: Every day | ORAL | 0 refills | Status: DC

## 2023-10-29 MED ORDER — NAPROXEN 500 MG PO TABS: 500.0000 mg | ORAL_TABLET | Freq: Two times a day (BID) | ORAL | 0 refills | Status: DC

## 2023-10-29 NOTE — Discharge Instructions (Addendum)
 Naprosyn with 1000 mg of Tylenol twice a day.  May take an additional 1000 mg of Tylenol 1 more time per day.  Medrol Dosepak as long as you monitor your sugars.  If your glucose, significantly elevated, discontinue the steroids.  Elbow band.  Over-the-counter lidocaine patch.  I have put in an urgent referral to Dr. Johnel Yielding Royalty, sports medicine.  Please follow-up with him as soon as possible.

## 2023-10-29 NOTE — ED Provider Notes (Signed)
 HPI  SUBJECTIVE:  [name] is a right-handed 58 y.o. male who presents with 2 to 3 weeks of lateral left forearm pain after doing bicep curls with his hands facing downwards.  He describes the pain as sharp, constant, radiating distally and occasionally to his shoulder.  It is waking him up at night.  No direct trauma to the area, does not recall a "pop" , deformity, bruising, grip weakness.  No recent fluoroquinolone use.  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  He has tried ice, rest, sleeping on his right side, elevation and has been occasionally taking 2 tabs of over-the-counter Aleve twice daily with improvement in his symptoms.  Symptoms are worse with supination, wrist flexion/extension.  He has a past medical history of chronic foot ulcer for which he is taking Augmentin, diabetes, hypercholesterolemia, peripheral neuropathy for which she takes oxycodone.  No history of hypertension, chronic kidney disease, tendon rupture, elbow/forearm injury.  PCP: [facility].    Past Medical History:  Diagnosis Date   Acute prostatitis without hematuria 05/20/2022   Allergy    Atypical pneumonia 12/18/2022   Complication of anesthesia    pt states at [facility] he stopped breathing due to his sleep apnea-hard to wake up   COVID-19    Depression    Diabetic ulcer of both feet (HCC)    DM (diabetes mellitus), type 2 (HCC)    History of kidney stones    History of methicillin resistant staphylococcus aureus (MRSA) 09/2020   foot   Neuromuscular disorder (HCC)    neuropathy of back and bilateral feet   Obesity    Sleep apnea    does not use cpap    Past Surgical History:  Procedure Laterality Date   AMPUTATION Left    toe amputation   CYSTOSCOPY/URETEROSCOPY/HOLMIUM LASER/STENT PLACEMENT Right 07/29/2022   Procedure: CYSTOSCOPY/URETEROSCOPY/HOLMIUM LASER/STENT PLACEMENT;  Surgeon: Sondra Come, MD;  Location: [facility];  Service: Urology;  Laterality: Right;   FOOT SURGERY  Right 09/2012   cyst removed   LOWER EXTREMITY ANGIOGRAPHY Right 07/12/2023   Procedure: Lower Extremity Angiography;  Surgeon: Annice Needy, MD;  Location: [facility];  Service: Cardiovascular;  Laterality: Right;   SPINE SURGERY  2004   nerve damage in spine   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  Current Outpatient Medications:    methylPREDNISolone (MEDROL DOSEPAK) 4 MG TBPK tablet, Take by mouth daily. Follow package instructions, Disp: 21 tablet, Rfl: 0   naproxen (NAPROSYN) 500 MG tablet, Take 1 tablet (500 mg total) by mouth 2 (two) times daily., Disp: 20 tablet, Rfl: 0   amoxicillin-clavulanate (AUGMENTIN) 875-125 MG tablet, Take 1 tablet by mouth 2 (two) times daily., Disp: 60 tablet, Rfl: 1   aspirin EC 81 MG tablet, Take 81 mg by mouth  daily. Swallow whole., Disp: , Rfl:    atorvastatin (LIPITOR) 20 MG tablet, Take 1 tablet (20 mg total) by mouth daily., Disp: 90 tablet, Rfl: 3   Continuous Glucose Receiver (DEXCOM G7 RECEIVER) DEVI, Check blood sugars at least 3 times a day, Disp: 1 each, Rfl: 0   Continuous Glucose Sensor (DEXCOM G7 SENSOR) MISC, Use 1 sensor every 10 days for 30 days. No refills., Disp: 3 each, Rfl: 0   doxycycline (VIBRA-TABS) 100 MG tablet, Take 1 tablet (100 mg total) by mouth 2 (two) times daily., Disp: 60 tablet, Rfl: 1   metFORMIN (GLUCOPHAGE-XR) 500 MG 24 hr tablet, TAKE 1 TABLET BY MOUTH EVERY DAY  WITH BREAKFAST, Disp: 90 tablet, Rfl: 1   Multiple Vitamins-Minerals (MULTIVITAMIN WITH MINERALS) tablet, Take 1 tablet by mouth daily., Disp: , Rfl:    oxyCODONE (ROXICODONE) 5 MG immediate release tablet, Take 1.5 tablets (7.5 mg total) by mouth daily. As needed for severe pain, Disp: 45 tablet, Rfl: 0   SODIUM FLUORIDE 5000 SENSITIVE 1.1-5 % GEL, Brush as directed by your provider., Disp: , Rfl:    tamsulosin (FLOMAX) 0.4 MG CAPS capsule, Take 0.4 mg by mouth daily., Disp: , Rfl:    telmisartan (MICARDIS) 20 MG tablet, TAKE 1 TABLET BY MOUTH EVERYDAY AT BEDTIME, Disp: 90 tablet, Rfl: 1  No Known Allergies   ROS  As noted in HPI.   Physical Exam  BP 131/70 (BP Location: Right Arm)   Pulse 90   Temp 98.3 F (36.8 C) (Oral)   Resp 15   Ht 5\' 10"  (1.778 m)   Wt 105.7 kg   SpO2 95%   BMI 33.44 kg/m   Constitutional: Well developed, well nourished, no acute distress Eyes:  EOMI, conjunctiva normal bilaterally HENT: Normocephalic, atraumatic,mucus membranes moist Respiratory: Normal inspiratory effort Cardiovascular: Normal rate GI: nondistended skin: No rash, skin intact Musculoskeletal: no deformities Tenderness at the insertion of the flexor tendons at the lateral epicondyle.  Tenderness in the flexor tendons.  No soft tissue defect.  Mild tenderness along the biceps and distal insertion of the biceps tendon.  No soft tissue defect.  Elbow otherwise nontender.  Pain with flexion/extension of the wrist, pronation.  No pain with radial/ulnar deviation.  RP 2+.  Shoulder, elbow nontender.  RP 2+.  Sensation and motor intact in median/radial/ulnar distribution Neurologic: Alert & oriented x 3, no focal neuro deficits Psychiatric: Speech and behavior appropriate   ED Course   Medications - No data to display  Orders Placed This Encounter  Procedures   AMB referral to sports medicine    Referral Priority:   Urgent    Referral Type:   Consultation    Referred to Provider:    Jerrol Banana, MD    Number of Visits Requested:   1    No results found for this or any previous visit (from the past 24 hours). No results found.  ED Clinical Impression  1. Tendinitis of forearm   2. Biceps tendinitis of left upper extremity      ED Assessment/Plan     Presents with biceps tendinitis and forearm tendinitis.  Home with Naprosyn/Tylenol, Medrol Dosepak as long as patient monitors his sugars this way, lidocaine patch over-the-counter.  I believe that x-rays would be low yield the absence of direct trauma.  I doubt fracture.  Discussed this with patient.  He is amenable to this.  He may also try an elbow band.  Will urgently refer to Dr. Cameo Shewell Royalty,  sports medicine.  Advised this may take several weeks- months to completely resolve.  Discussed  MDM, treatment plan, and plan for follow-up with patient.  patient agrees with plan.   Meds ordered this encounter  Medications   naproxen (NAPROSYN) 500 MG tablet    Sig: Take 1 tablet (500 mg total) by mouth 2 (two) times daily.    Dispense:  20 tablet    Refill:  0   methylPREDNISolone (MEDROL DOSEPAK) 4 MG TBPK tablet    Sig: Take by mouth daily. Follow package instructions    Dispense:  21 tablet    Refill:  0      *This clinic note was created using Dragon dictation software. Therefore, there may be occasional mistakes despite careful proofreading.  ?    Domenick Gong, MD 10/30/23 1051

## 2023-10-29 NOTE — ED Triage Notes (Signed)
 Patient states that he was doing bicep curls 2 weeks ago.  Patient states that the following day he started having pain that starts in his left lower arm and radiates up to his left elbow.

## 2023-11-03 ENCOUNTER — Other Ambulatory Visit (INDEPENDENT_AMBULATORY_CARE_PROVIDER_SITE_OTHER): Payer: 59

## 2023-11-03 ENCOUNTER — Encounter: Payer: Self-pay | Admitting: Internal Medicine

## 2023-11-03 DIAGNOSIS — R5383 Other fatigue: Secondary | ICD-10-CM

## 2023-11-03 DIAGNOSIS — E1165 Type 2 diabetes mellitus with hyperglycemia: Secondary | ICD-10-CM

## 2023-11-03 DIAGNOSIS — E785 Hyperlipidemia, unspecified: Secondary | ICD-10-CM | POA: Diagnosis not present

## 2023-11-03 DIAGNOSIS — E1169 Type 2 diabetes mellitus with other specified complication: Secondary | ICD-10-CM | POA: Diagnosis not present

## 2023-11-03 DIAGNOSIS — I10 Essential (primary) hypertension: Secondary | ICD-10-CM

## 2023-11-03 LAB — CBC WITH DIFFERENTIAL/PLATELET
Basophils Absolute: 0.1 10*3/uL (ref 0.0–0.1)
Basophils Relative: 0.5 % (ref 0.0–3.0)
Eosinophils Absolute: 0.1 10*3/uL (ref 0.0–0.7)
Eosinophils Relative: 0.6 % (ref 0.0–5.0)
HCT: 48.2 % (ref 39.0–52.0)
Hemoglobin: 16.4 g/dL (ref 13.0–17.0)
Lymphocytes Relative: 17.3 % (ref 12.0–46.0)
Lymphs Abs: 1.9 10*3/uL (ref 0.7–4.0)
MCHC: 33.9 g/dL (ref 30.0–36.0)
MCV: 89.4 fl (ref 78.0–100.0)
Monocytes Absolute: 1.4 10*3/uL — ABNORMAL HIGH (ref 0.1–1.0)
Monocytes Relative: 12.7 % — ABNORMAL HIGH (ref 3.0–12.0)
Neutro Abs: 7.6 10*3/uL (ref 1.4–7.7)
Neutrophils Relative %: 68.9 % (ref 43.0–77.0)
Platelets: 239 10*3/uL (ref 150.0–400.0)
RBC: 5.39 Mil/uL (ref 4.22–5.81)
RDW: 15.2 % (ref 11.5–15.5)
WBC: 11 10*3/uL — ABNORMAL HIGH (ref 4.0–10.5)

## 2023-11-03 LAB — LDL CHOLESTEROL, DIRECT: Direct LDL: 90 mg/dL

## 2023-11-03 LAB — HEMOGLOBIN A1C: Hgb A1c MFr Bld: 9.6 % — ABNORMAL HIGH (ref 4.6–6.5)

## 2023-11-03 LAB — COMPREHENSIVE METABOLIC PANEL
ALT: 31 U/L (ref 0–53)
AST: 22 U/L (ref 0–37)
Albumin: 3.9 g/dL (ref 3.5–5.2)
Alkaline Phosphatase: 56 U/L (ref 39–117)
BUN: 22 mg/dL (ref 6–23)
CO2: 24 meq/L (ref 19–32)
Calcium: 8.4 mg/dL (ref 8.4–10.5)
Chloride: 100 meq/L (ref 96–112)
Creatinine, Ser: 0.76 mg/dL (ref 0.40–1.50)
GFR: 98.57 mL/min (ref >60.00)
Glucose, Bld: 283 mg/dL — ABNORMAL HIGH (ref 70–99)
Potassium: 3.9 meq/L (ref 3.5–5.1)
Sodium: 133 meq/L — ABNORMAL LOW (ref 135–145)
Total Bilirubin: 0.6 mg/dL (ref 0.2–1.2)
Total Protein: 7.8 g/dL (ref 6.0–8.3)

## 2023-11-03 LAB — LIPID PANEL
Cholesterol: 156 mg/dL (ref 0–200)
HDL: 34.7 mg/dL — ABNORMAL LOW (ref >39.00)
LDL Cholesterol: 78 mg/dL (ref 0–99)
NonHDL: 120.8
Total CHOL/HDL Ratio: 4
Triglycerides: 216 mg/dL — ABNORMAL HIGH (ref 0.0–149.0)
VLDL: 43.2 mg/dL — ABNORMAL HIGH (ref 0.0–40.0)

## 2023-11-03 LAB — MICROALBUMIN / CREATININE URINE RATIO
Creatinine,U: 101.6 mg/dL
Microalb Creat Ratio: 151.6 mg/g — ABNORMAL HIGH (ref 0.0–30.0)
Microalb, Ur: 15.4 mg/dL — ABNORMAL HIGH (ref 0.0–1.9)

## 2023-11-03 LAB — TSH: TSH: 0.81 u[IU]/mL (ref 0.35–5.50)

## 2023-11-04 ENCOUNTER — Other Ambulatory Visit: Payer: Self-pay | Admitting: Internal Medicine

## 2023-11-05 ENCOUNTER — Other Ambulatory Visit: Payer: Self-pay | Admitting: Internal Medicine

## 2023-11-05 ENCOUNTER — Encounter: Payer: Self-pay | Admitting: Internal Medicine

## 2023-11-05 MED ORDER — TELMISARTAN 40 MG PO TABS
ORAL_TABLET | ORAL | 1 refills | Status: DC
Start: 2023-11-05 — End: 2024-06-11

## 2023-11-05 NOTE — Addendum Note (Signed)
 Addended by: Sherlene Shams on: 11/05/2023 09:03 PM   Modules accepted: Orders

## 2023-11-05 NOTE — Assessment & Plan Note (Signed)
 ADVISED TO INCREASE TELMISARTAN DOSE T O  40 MG FOR INCREASED PROTEINURIA   Lab Results  Component Value Date   LABMICR See below: 08/03/2022   MICROALBUR 15.4 (H) 11/03/2023   MICROALBUR 1.2 10/04/2022

## 2023-11-06 ENCOUNTER — Encounter: Payer: Self-pay | Admitting: Internal Medicine

## 2023-11-06 MED ORDER — OXYCODONE HCL 5 MG PO TABS
7.5000 mg | ORAL_TABLET | Freq: Every day | ORAL | 0 refills | Status: DC
Start: 2023-11-06 — End: 2023-11-29

## 2023-11-29 ENCOUNTER — Encounter: Payer: Self-pay | Admitting: Internal Medicine

## 2023-11-29 ENCOUNTER — Ambulatory Visit (INDEPENDENT_AMBULATORY_CARE_PROVIDER_SITE_OTHER): Payer: 59 | Admitting: Internal Medicine

## 2023-11-29 VITALS — BP 148/76 | HR 111 | Ht 70.0 in | Wt 228.8 lb

## 2023-11-29 DIAGNOSIS — Z794 Long term (current) use of insulin: Secondary | ICD-10-CM | POA: Diagnosis not present

## 2023-11-29 DIAGNOSIS — G4733 Obstructive sleep apnea (adult) (pediatric): Secondary | ICD-10-CM

## 2023-11-29 DIAGNOSIS — E1165 Type 2 diabetes mellitus with hyperglycemia: Secondary | ICD-10-CM | POA: Diagnosis not present

## 2023-11-29 MED ORDER — METFORMIN HCL ER 500 MG PO TB24: ORAL_TABLET | ORAL | 1 refills | Status: DC

## 2023-11-29 MED ORDER — OXYCODONE HCL 5 MG PO TABS: 7.5000 mg | ORAL_TABLET | Freq: Every day | ORAL | 0 refills | Status: DC

## 2023-11-29 MED ORDER — DULOXETINE HCL 30 MG PO CPEP: 30.0000 mg | ORAL_CAPSULE | Freq: Every day | ORAL | 1 refills | Status: DC

## 2023-11-29 MED ORDER — TOUJEO MAX SOLOSTAR 300 UNIT/ML ~~LOC~~ SOPN: 20.0000 [IU] | PEN_INJECTOR | Freq: Every day | SUBCUTANEOUS | 0 refills | Status: DC

## 2023-11-29 NOTE — Patient Instructions (Addendum)
 I am starting you on once daily insulin called Toujeo.  Your starting dose is 20 units daily   We will review your blood sugars via DEX COM every  2 weeks and make dose adjustments based on your readings   You have not been referred for sleep study in recent years.  I have made a new referral and this company WILL send a representative to your home  Please resume the telmisartan at the 20 mg dose  .  If the diarrhea returns LET ME KNOW

## 2023-11-29 NOTE — Assessment & Plan Note (Addendum)
 He is frustrated at his inability to drop below 220 lbs despite exercising and follow a very low carb diet.  He is limited by his diabetic foot infection due to pressure ulcers complicated by polyneuropathy.  He has a History of pancreatitis, multiple episodes over a decade ago  but GLP 1 agonists are contraindicated

## 2023-11-29 NOTE — Progress Notes (Unsigned)
 Subjective:  Patient ID: [name], male    DOB: 08-15-1966  Age: 58 y.o. MRN: [id number]  CC: There were no encounter diagnoses.   HPI RONIEL HALLORAN presents for  Chief Complaint  Patient presents with   Medical Management of Chronic Issues   1) uncontrolled type 2 DM:  patient ot using Dexcom but checking sugars and his average is 200.  Taking metformin  exercising at the gym.  Frustrated.  Requesting nutritional referral from the health dept.  Either  Reviewed diet:  bfast is all protein/fat    snacks on cheese sticks.  Lunch is either salads with berries,  [place], and cheese.  Supper is some meat,  a zucchini/squash   2) home sleep study was ordered but he was unable to go to their  office to pick up the equipment and receive the tutorial for set up.  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  He developed profound sadness that lasted several days  and included  several days of anorexia,  and insomnia.  He was  not suicidal.  Stayed out of work for 2 days plus today.  Triggered by  2 deaths in the family in the past 2 weeks  ,  family members he hadn't seen in  20 years.   4) HTN:  has not been causing telmisartan because he thinks it caused diarrhea,  stopped taking it so long ago,  stools now are normal. Willing to restart at 20 mg daily   5 forearm tendonitis : treaed in ER on March 2 with medrol dose pack AND naproxen.  Took both at the same time.  Has not continued naproxen.  Symptoms better.    Outpatient Medications Prior to Visit  Medication Sig Dispense Refill   aspirin EC 81 MG tablet Take 81 mg by mouth daily. Swallow whole.     atorvastatin (LIPITOR) 20 MG tablet Take 1 tablet (20 mg total) by mouth daily. 90 tablet 3   Continuous Glucose Receiver (DEXCOM G7 RECEIVER) DEVI Check blood sugars at least 3 times a day 1 each 0   Continuous Glucose Sensor (DEXCOM G7 SENSOR) MISC Use 1 sensor every 10 days for 30 days. No refills. 3 each 0    DULoxetine (CYMBALTA) 30 MG capsule Take by mouth daily. 90 capsule 2   metFORMIN (GLUCOPHAGE-XR) 500 MG 24 hr tablet TAKE 1 TABLET BY MOUTH EVERY DAY WITH BREAKFAST 90 tablet 1   Multiple Vitamins-Minerals (MULTIVITAMIN WITH MINERALS) tablet Take 1 tablet by mouth daily.     naproxen (NAPROSYN) 500 MG tablet Take 1 tablet (500 mg total) by mouth 2 (two) times daily. 20 tablet 0   oxyCODONE (ROXICODONE) 5 MG immediate release tablet Take 1.5 tablets (7.5 mg total) by mouth daily. As needed for severe pain 45 tablet 0   SODIUM FLUORIDE 5000 SENSITIVE 1.1-5 % GEL Brush as directed by your provider.     tamsulosin (FLOMAX) 0.4 MG CAPS capsule Take 0.4 mg by mouth daily.     telmisartan (MICARDIS) 40 MG tablet TAKE 1 TABLET BY MOUTH EVERYDAY AT BEDTIME 90 tablet 1   methylPREDNISolone (MEDROL DOSEPAK) 4 MG TBPK tablet Take by mouth daily. Follow package instructions (Patient not taking: Reported on 11/29/2023) 21 tablet 0   No facility-administered medications prior to visit.    Review of Systems;  Patient denies headache, fevers, malaise, unintentional weight loss, skin rash, eye pain, sinus congestion and sinus pain,  sore throat, dysphagia,  hemoptysis , cough, dyspnea, wheezing, chest pain, palpitations, orthopnea, edema, abdominal pain, nausea, melena, diarrhea, constipation, flank pain, dysuria, hematuria, urinary  Frequency, nocturia, numbness, tingling, seizures,  Focal weakness, Loss of consciousness,  Tremor, insomnia, depression, anxiety, and suicidal ideation.      Objective:  BP (!) 148/76   Pulse (!) 111   Ht 5\' 10"  (1.778 m)   Wt 228 lb 12.8 oz (103.8 kg)   SpO2 95%   BMI 32.83 kg/m   BP Readings from Last 3 Encounters:  11/29/23 (!) 148/76  10/29/23 131/70  09/21/23 135/74    Wt Readings from Last 3 Encounters:  11/29/23 228 lb 12.8 oz (103.8 kg)  10/29/23 233 lb 0.4 oz (105.7 kg)  09/21/23 233 lb (105.7 kg)    Physical Exam  Lab Results  Component Value Date    HGBA1C 9.6 (H) 11/03/2023   HGBA1C 8.6 (H) 07/11/2023   HGBA1C 7.0 (H) 12/29/2022    Lab Results  Component Value Date   CREATININE 0.76 11/03/2023   CREATININE 0.81 08/17/2023   CREATININE 0.67 07/15/2023    Lab Results  Component Value Date   WBC 11.0 (H) 11/03/2023   HGB 16.4 11/03/2023   HCT 48.2 11/03/2023   PLT 239.0 11/03/2023   GLUCOSE 283 (H) 11/03/2023   CHOL 156 11/03/2023   TRIG 216.0 (H) 11/03/2023   HDL 34.70 (L) 11/03/2023   LDLDIRECT 90.0 11/03/2023   LDLCALC 78 11/03/2023   ALT 31 11/03/2023   AST 22 11/03/2023   NA 133 (L) 11/03/2023   K 3.9 11/03/2023   CL 100 11/03/2023   CREATININE 0.76 11/03/2023   BUN 22 11/03/2023   CO2 24 11/03/2023   TSH 0.81 11/03/2023   PSA 0.18 02/22/2022   INR 1.3 (H) 12/19/2022   HGBA1C 9.6 (H) 11/03/2023   MICROALBUR 15.4 (H) 11/03/2023    No results found.  Assessment & Plan:  .There are no diagnoses linked to this encounter.   I spent 34 minutes on the day of this face to face encounter reviewing patient's  most recent visit with cardiology,  nephrology,  and neurology,  prior relevant surgical and non surgical procedures, recent  labs and imaging studies, counseling on weight management,  reviewing the assessment and plan with patient, and post visit ordering and reviewing of  diagnostics and therapeutics with patient  .   Follow-up: No follow-ups on file.   Sherlene Shams, MD

## 2023-11-30 NOTE — Assessment & Plan Note (Signed)
 He refused additional medications during November hospitalization and is taking only metformin. He is now receptive to the idea of insulin.  Starting Toujeo 20 units daily.  . Encouraged to apply Dexcom ASAP for glycemic control can be modified safely   Lab Results  Component Value Date   HGBA1C 9.6 (H) 11/03/2023

## 2023-12-05 MED ORDER — PEN NEEDLES 32G X 6 MM MISC: 3 refills | Status: AC

## 2023-12-10 ENCOUNTER — Encounter: Payer: Self-pay | Admitting: Internal Medicine

## 2023-12-10 ENCOUNTER — Other Ambulatory Visit: Payer: Self-pay | Admitting: Internal Medicine

## 2023-12-10 DIAGNOSIS — G4733 Obstructive sleep apnea (adult) (pediatric): Secondary | ICD-10-CM

## 2023-12-11 ENCOUNTER — Telehealth: Payer: Self-pay

## 2023-12-11 DIAGNOSIS — G4733 Obstructive sleep apnea (adult) (pediatric): Secondary | ICD-10-CM

## 2023-12-11 MED ORDER — DEXCOM G7 SENSOR MISC: 0 refills | Status: DC

## 2023-12-11 NOTE — Telephone Encounter (Signed)
 The home sleep test order WAS placed on the day of his visit     it's included in the office note.  please ask [name] to advvise

## 2023-12-11 NOTE — Telephone Encounter (Signed)
I have pended the referral for your approval.

## 2023-12-11 NOTE — Telephone Encounter (Signed)
 I have sent a telephone encounter to Dr. Madelon Scheuermann with the referral pended for a sleep study.

## 2023-12-17 ENCOUNTER — Encounter: Payer: Self-pay | Admitting: Internal Medicine

## 2023-12-18 ENCOUNTER — Telehealth: Payer: Self-pay

## 2023-12-18 DIAGNOSIS — E1165 Type 2 diabetes mellitus with hyperglycemia: Secondary | ICD-10-CM

## 2023-12-18 DIAGNOSIS — M86671 Other chronic osteomyelitis, right ankle and foot: Secondary | ICD-10-CM

## 2023-12-18 DIAGNOSIS — E1169 Type 2 diabetes mellitus with other specified complication: Secondary | ICD-10-CM

## 2023-12-18 DIAGNOSIS — I10 Essential (primary) hypertension: Secondary | ICD-10-CM

## 2023-12-18 NOTE — Telephone Encounter (Signed)
 Copied from CRM [id number]. Topic: Clinical - Request for Lab/Test Order >> Dec 15, 2023  8:39 AM [name] H wrote: Reason for CRM: Patient is calling to find out if Dr. Madelon Scheuermann wants him to come in and have labs done before his appointment on 7/3, please reach out to patient to verify.  T[place]ac [phone]

## 2023-12-18 NOTE — Addendum Note (Signed)
 Addended by: Thersia Flax on: 12/18/2023 12:42 PM   Modules accepted: Orders

## 2023-12-18 NOTE — Telephone Encounter (Signed)
 I have pended labs for pt to have done prior to his next appt on 02/29/2024.

## 2023-12-18 NOTE — Addendum Note (Signed)
 Addended by: Chadwick Colonel on: 12/18/2023 11:22 AM   Modules accepted: Orders

## 2023-12-23 ENCOUNTER — Encounter: Payer: Self-pay | Admitting: Internal Medicine

## 2023-12-27 ENCOUNTER — Telehealth: Payer: Self-pay

## 2023-12-27 DIAGNOSIS — G4733 Obstructive sleep apnea (adult) (pediatric): Secondary | ICD-10-CM

## 2023-12-27 NOTE — Telephone Encounter (Signed)
 Sleep study results have been received and placed in yellow results folder.

## 2023-12-29 ENCOUNTER — Encounter: Payer: Self-pay | Admitting: Internal Medicine

## 2023-12-29 NOTE — Telephone Encounter (Signed)
 Mychart message has been sent.

## 2023-12-29 NOTE — Assessment & Plan Note (Addendum)
 MODERATE TO SEVERE SLEEP APNEA BY REPEAT STUDY DONE OVER 2 NIGHTS  APRIL 2025 .  AUTOTITRATING CPAP WILL BE ORDERED

## 2024-01-07 ENCOUNTER — Encounter: Payer: Self-pay | Admitting: Internal Medicine

## 2024-01-09 MED ORDER — DULOXETINE HCL 60 MG PO CPEP: 60.0000 mg | ORAL_CAPSULE | Freq: Every day | ORAL | 5 refills | Status: DC

## 2024-01-10 ENCOUNTER — Other Ambulatory Visit: Payer: Self-pay | Admitting: Internal Medicine

## 2024-01-11 MED ORDER — OXYCODONE HCL 5 MG PO TABS: 7.5000 mg | ORAL_TABLET | Freq: Every day | ORAL | 0 refills | Status: DC

## 2024-01-20 ENCOUNTER — Other Ambulatory Visit: Payer: Self-pay | Admitting: Internal Medicine

## 2024-01-25 ENCOUNTER — Encounter: Payer: Self-pay | Admitting: Internal Medicine

## 2024-02-01 ENCOUNTER — Ambulatory Visit
Admission: RE | Admit: 2024-02-01 | Discharge: 2024-02-01 | Disposition: A | Discharge status: Home / Self Care | Attending: Physician Assistant | Admitting: Physician Assistant

## 2024-02-01 ENCOUNTER — Ambulatory Visit
Admission: RE | Admit: 2024-02-01 | Discharge: 2024-02-01 | Disposition: A | Discharge status: Home / Self Care | Source: Ambulatory Visit | Attending: Physician Assistant | Admitting: Physician Assistant

## 2024-02-01 ENCOUNTER — Ambulatory Visit: Admitting: Physician Assistant

## 2024-02-01 ENCOUNTER — Other Ambulatory Visit: Payer: Self-pay

## 2024-02-01 VITALS — BP 130/69 | HR 95 | Ht 70.0 in | Wt 220.0 lb

## 2024-02-01 DIAGNOSIS — N200 Calculus of kidney: Secondary | ICD-10-CM

## 2024-02-01 DIAGNOSIS — N201 Calculus of ureter: Secondary | ICD-10-CM | POA: Diagnosis not present

## 2024-02-01 LAB — MICROSCOPIC EXAMINATION

## 2024-02-01 LAB — URINALYSIS, COMPLETE
Bilirubin, UA: NEGATIVE
Leukocytes,UA: NEGATIVE
Nitrite, UA: NEGATIVE
Protein,UA: NEGATIVE
Specific Gravity, UA: 1.015 (ref 1.005–1.030)
Urobilinogen, Ur: 0.2 mg/dL (ref 0.2–1.0)
pH, UA: 6 (ref 5.0–7.5)

## 2024-02-01 MED ORDER — ONDANSETRON 4 MG PO TBDP: 4.0000 mg | ORAL_TABLET | Freq: Three times a day (TID) | ORAL | 0 refills | Status: DC | PRN

## 2024-02-01 MED ORDER — OXYCODONE-ACETAMINOPHEN 5-325 MG PO TABS: 1.0000 | ORAL_TABLET | Freq: Four times a day (QID) | ORAL | 0 refills | Status: DC | PRN

## 2024-02-01 MED ORDER — TAMSULOSIN HCL 0.4 MG PO CAPS: 0.4000 mg | ORAL_CAPSULE | Freq: Every day | ORAL | 0 refills | Status: DC

## 2024-02-01 NOTE — Patient Instructions (Addendum)
 We're planning for left ureteroscopy surgery with Dr. Estanislao Heimlich next Thursday, 02/08/2024. In the meantime, please do the following: -Stay well hydrated -Take Flomax  0.4mg  daily -Treat any pain with Advil (ibuprofen), Tylenol  (acetaminophen ), or Percocet -Treat any nausea with Zofran   Please call our office immediately (we are open 8a-5p Monday-Friday) or go to the Emergency Department if you develop any of the following: -Fever/chills -Nausea and/or vomiting uncontrollable with Zofran  -Pain uncontrollable with Percocet

## 2024-02-01 NOTE — Progress Notes (Signed)
 02/01/2024 2:55 PM   [name] April 10, 1966 [id number]  CC: Chief Complaint  Patient presents with   Nephrolithiasis   HPI: [name] is a 59 y.o. male with PMH diabetes and nephrolithiasis who presents today for evaluation of a possible acute stone episode.   Today he reports sudden onset left flank and LLQ pain this morning, currently 2-3/10 in intensity.  He took ibuprofen, which helped.  He denies fever, chills, nausea, or vomiting.  KUB today with a 8 mm left UPJ stone.  Notably, he underwent attempted bilateral ureteroscopy with Dr. Estanislao Heimlich on 07/29/2022, however Dr. Estanislao Heimlich was unable to advance the ureteroscope into the left ureter and to the left sided procedure was aborted.  In-office UA today positive for 3+ glucose, trace intact blood, and trace ketones; urine microscopy with 3-10 RBCs/HPF.  PMH: Past Medical History:  Diagnosis Date   Acute prostatitis without hematuria 05/20/2022   Allergy    Atypical pneumonia 12/18/2022   Complication of anesthesia    pt states at [facility] he stopped breathing due to his sleep apnea-hard to wake up   COVID-19    Depression    Diabetic ulcer of both feet (HCC)    DM (diabetes mellitus), type 2 (HCC)    History of kidney stones    History of methicillin resistant staphylococcus aureus (MRSA) 09/2020   foot   Neuromuscular disorder (HCC)    neuropathy of back and bilateral feet   Obesity    Sleep apnea    does not use cpap    Surgical History: Past Surgical History:  Procedure Laterality Date   AMPUTATION Left    toe amputation   CYSTOSCOPY/URETEROSCOPY/HOLMIUM LASER/STENT PLACEMENT Right 07/29/2022   Procedure: CYSTOSCOPY/URETEROSCOPY/HOLMIUM LASER/STENT PLACEMENT;  Surgeon: Lawerence Pressman, MD;  Location: [facility] ORS;  Service: Urology;  Laterality: Right;   FOOT SURGERY Right 09/2012   cyst removed   LOWER EXTREMITY ANGIOGRAPHY Right 07/12/2023   Procedure: Lower Extremity Angiography;  Surgeon: Celso College,  MD;  Location: [facility];  Service: Cardiovascular;  Laterality: Right;   SPINE SURGERY  2004   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 If you have any questions, ask your nurse or doctor.          aspirin  EC 81 MG tablet Take 81 mg by mouth daily. Swallow whole.   atorvastatin  20 MG tablet Commonly known as: LIPITOR Take 1 tablet (20 mg total) by mouth daily.   Dexcom G7 Receiver Devi Check blood sugars at least 3 times a day   Dexcom G7 Sensor Misc USE 1 SENSOR EVERY 10 DAYS FOR 30 DAYS. NO REFILLS.   DULoxetine  60 MG capsule Commonly known as: CYMBALTA  Take 1 capsule (60 mg total) by mouth daily.   metFORMIN  500 MG 24 hr tablet Commonly known as: GLUCOPHAGE -XR TAKE 1 TABLET BY MOUTH EVERY DAY WITH BREAKFAST   multivitamin with minerals tablet Take 1 tablet by mouth daily.   naproxen  500 MG tablet Commonly known as: NAPROSYN  Take 1 tablet (500 mg total) by mouth 2 (two) times daily.   oxyCODONE  5 MG immediate release tablet Commonly known as: Roxicodone  Take 1.5 tablets (7.5 mg total) by mouth daily. As needed for severe pain  oxyCODONE  5 MG immediate release tablet Commonly known as: Roxicodone  Take 1.5 tablets (7.5 mg total) by mouth daily. As needed for severe pain   Pen Needles 32G X 6 MM Misc Use to give insulin  once daily.   Sodium Fluoride 5000 Sensitive 1.1-5 % Gel Generic drug: Sod Fluoride-Potassium Nitrate Brush as directed by your provider.   tamsulosin  0.4 MG Caps capsule Commonly known as: FLOMAX  Take 0.4 mg by mouth daily.   telmisartan  40 MG tablet Commonly known as: MICARDIS  TAKE 1 TABLET BY MOUTH EVERYDAY AT BEDTIME   Toujeo  Max SoloStar 300 UNIT/ML Solostar Pen Generic drug: insulin  glargine (2 Unit Dial) Inject 20 Units into the skin daily.         Allergies:  No Known Allergies  Family History: Family History  Problem Relation Age of Onset   Heart disease Father    Stroke Father    Heart attack Father 90   Heart disease Maternal Grandmother    Stroke Maternal Grandmother    Heart disease Paternal Grandfather    Stroke Paternal Grandfather     Social History:   reports that he has never smoked. He has never been exposed to tobacco smoke. He has never used smokeless tobacco. He reports that he does not drink alcohol and does not use drugs.  Physical Exam: BP 130/69   Pulse 95   Ht 5\' 10"  (1.778 m)   Wt 220 lb (99.8 kg)   BMI 31.57 kg/m   Constitutional:  Alert and oriented, no acute distress, nontoxic appearing HEENT: [place], AT Cardiovascular: No clubbing, cyanosis, or edema Respiratory: Normal respiratory effort, no increased work of breathing Skin: No rashes, bruises or suspicious lesions Neurologic: Grossly intact, no focal deficits, moving all 4 extremities Psychiatric: Normal mood and affect  Laboratory Data: Results for orders placed or performed in visit on 02/01/24  Microscopic Examination   Collection Time: 02/01/24  2:17 PM   Urine  Result Value Ref Range   WBC, UA 0-5 0 - 5 /hpf   RBC, Urine 3-10 (A) 0 - 2 /hpf   Epithelial Cells (non renal) 0-10 0 - 10 /hpf   Mucus, UA Present (A) Not Estab.   Bacteria, UA Few None seen/Few  Urinalysis, Complete   Collection Time: 02/01/24  2:17 PM  Result Value Ref Range   Specific Gravity, UA 1.015 1.005 - 1.030   pH, UA 6.0 5.0 - 7.5   Color, UA Yellow Yellow   Appearance Ur Clear Clear   Leukocytes,UA Negative Negative   Protein,UA Negative Negative/Trace   Glucose, UA 3+ (A) Negative   Ketones, UA Trace (A) Negative   RBC, UA Trace (A) Negative   Bilirubin, UA Negative Negative   Urobilinogen, Ur 0.2 0.2 - 1.0 mg/dL   Nitrite, UA Negative Negative   Microscopic Examination See below:   CULTURE, URINE COMPREHENSIVE   Collection Time: 02/01/24  4:28  PM   Specimen: Urine   UR  Result Value Ref Range   Urine Culture, Comprehensive Preliminary report    Organism ID, Bacteria Comment    Organism ID, Bacteria Comment     Pertinent Imaging: KUB, 02/01/2024: CLINICAL DATA:  kidney stones   EXAM: ABDOMEN - 1 VIEW   COMPARISON:  07/26/2022.   FINDINGS: Redemonstration of a well-circumscribed oval 5 x 9 mm calcification overlying the left upper abdomen, between the lateral aspect of the L2 and L3 transverse processes. The calcification orientation appears different than the prior exam and calculus is more midline than  prior exam, favoring probable within the renal pelvis region. Differential diagnosis also includes soft tissue calcifications. Correlate clinically to determine the need for additional imaging with CT scan. No other abnormal calcification noted overlying bilateral kidneys, ureters and urinary bladder region.   The bowel gas pattern is non-obstructive.   No evidence of pneumoperitoneum, within the limitations of a supine film.   No acute osseous abnormalities.   The soft tissues are within normal limits.   Surgical changes, devices, tubes and lines: None.   Other findings: None.   IMPRESSION: Redemonstration of a well-circumscribed oval 5 x 9 mm calcification overlying the left upper abdomen, between the lateral aspect of the L2 and L3 transverse processes. The calcification orientation appears different than the prior exam and calculus is more midline than prior exam, favoring probable within the renal pelvis region. Differential diagnosis also includes soft tissue calcifications.     Electronically Signed   By: Beula Brunswick M.D.   On: 02/04/2024 14:07  I personally reviewed the images referenced above and note an 8 mm left UPJ stone.  Assessment & Plan:   1. Obstruction of left ureteropelvic junction (UPJ) due to stone (Primary) Left renal colic 2/2 8mm left UPJ stone. Not clinically  infected.  We discussed various treatment options for his stone including trial of passage vs. ESWL vs. ureteroscopy with laser lithotripsy and stent. We discussed the risks and benefits of all procedures including bleeding, infection, and damage to surrounding structures.  We specifically discussed that ESWL is a less invasive procedure that requires less anesthesia, however patients have to pass their residual stone fragments, which may take several weeks and be associated with continued renal colic. Additionally, we discussed the limitations of ESWL including the low, 10-20% chance of treatment failure requiring repeat ESWL versus ureteroscopy in the future. By comparison, ureteroscopy is a more invasive surgical procedure that requires more anesthesia. However, URS does require placement of a ureteral stent, which will remain in place for approximately 3-10 days and can be associated with flank pain, bladder pain, dysuria, urgency, frequency, urinary leakage, and gross hematuria. Unfortunately with his history of tight ureters, we would anticipate a likely staged approach with stent placement alone for passive dilation x1-2 weeks prior to attempted URS.  In clinic, the patient and I mutually agreed to move forward with left ureteroscopy, however after speaking with Dr. Estanislao Heimlich afterward, I reached out to him via telephone to discuss reconsideration of ESWL given his tight ureters.  Ultimately, he agreed with amending the plan to ESWL.  We again reviewed risks including treatment failure and perinephric hematoma.  He has not been taking NSAIDs and I advised him to continue to hold these for 72 hours prior to his procedure.  I am refilling pain med to get him to his procedure.  I also sent in Zofran  and Flomax . - Urinalysis, Complete - CULTURE, URINE COMPREHENSIVE - ondansetron  (ZOFRAN -ODT) 4 MG disintegrating tablet; Take 1 tablet (4 mg total) by mouth every 8 (eight) hours as needed for nausea or  vomiting.  Dispense: 20 tablet; Refill: 0 - tamsulosin  (FLOMAX ) 0.4 MG CAPS capsule; Take 1 capsule (0.4 mg total) by mouth daily.  Dispense: 30 capsule; Refill: 0 - Ambulatory Referral For Surgery Scheduling - oxyCODONE -acetaminophen  (PERCOCET/ROXICET) 5-325 MG tablet; Take 1-2 tablets by mouth every 6 (six) hours as needed for up to 5 days for severe pain (pain score 7-10).  Dispense: 15 tablet; Refill: 0   Return for Will call to schedule surgery.  Kathreen Pare, PA-C  [facility] [place] [address], [address] [place], [place] [address] [phone]

## 2024-02-02 ENCOUNTER — Other Ambulatory Visit: Payer: Self-pay | Admitting: Internal Medicine

## 2024-02-03 DIAGNOSIS — N201 Calculus of ureter: Secondary | ICD-10-CM

## 2024-02-05 MED ORDER — OXYCODONE-ACETAMINOPHEN 5-325 MG PO TABS
1.0000 | ORAL_TABLET | Freq: Four times a day (QID) | ORAL | 0 refills | Status: AC | PRN
Start: 2024-02-05 — End: 2024-02-10

## 2024-02-06 ENCOUNTER — Other Ambulatory Visit: Payer: Self-pay

## 2024-02-06 DIAGNOSIS — N201 Calculus of ureter: Secondary | ICD-10-CM

## 2024-02-06 LAB — CULTURE, URINE COMPREHENSIVE

## 2024-02-06 NOTE — Progress Notes (Unsigned)
 ESWL ORDER FORM  Expected date of procedure: 02/08/2024  Surgeon: Darlynn Elam, MD  Post op standing: 2-4wk follow up w/KUB prior  Anticoagulation/Aspirin /NSAID standing order: Hold all 72 hours prior  Anesthesia standing order: MAC  VTE standing: SCD's  Dx: Left Ureteral Stone  Procedure: left Extracorporeal shock wave lithotripsy  CPT : 50590  Standing Order Set:   *NPO after mn, KUB  *NS 170ml/hr, Keflex  500mg  PO, Benadryl  25mg  PO, Valium 10mg  PO, Zofran  4mg  IV  Medications if other than standing orders:   NONE

## 2024-02-07 NOTE — Progress Notes (Signed)
    [facility] ESWL POSTING SHEET        Patient Name: [name]  DOB: [birth year]  MRN: [id number]  Surgeon:  Darlynn Elam, MD  Diagnosis:  Left Ureteral Stone  CPT: 40981  ESWL DATE: 02/08/2024  ESWL TIME: 0730am  Special Needs/Requirements: none       Cardiac/Medical/Pulmonary Clearance needed: no       Form Faxed to [facility]- [phone] Date:   Date: 02/07/24       Form Faxed to [place]- [phone]  Date:  Date: 02/07/24           Copy Made for Insurance PA:  Date: 02/07/24       Orders Entered in to Epic:  Date: 02/07/24

## 2024-02-08 ENCOUNTER — Ambulatory Visit

## 2024-02-08 ENCOUNTER — Encounter: Payer: Self-pay | Admitting: Urology

## 2024-02-08 ENCOUNTER — Ambulatory Visit
Admission: RE | Admit: 2024-02-08 | Discharge: 2024-02-08 | Disposition: A | Discharge status: Home / Self Care | Attending: Urology | Admitting: Urology

## 2024-02-08 ENCOUNTER — Encounter
Admission: RE | Disposition: A | Discharge status: Home / Self Care | Payer: Self-pay | Source: Home / Self Care | Attending: Urology

## 2024-02-08 ENCOUNTER — Other Ambulatory Visit: Payer: Self-pay

## 2024-02-08 DIAGNOSIS — Z538 Procedure and treatment not carried out for other reasons: Secondary | ICD-10-CM | POA: Insufficient documentation

## 2024-02-08 DIAGNOSIS — N201 Calculus of ureter: Secondary | ICD-10-CM | POA: Diagnosis present

## 2024-02-08 HISTORY — PX: EXTRACORPOREAL SHOCK WAVE LITHOTRIPSY: SHX1557

## 2024-02-08 LAB — GLUCOSE, CAPILLARY: Glucose-Capillary: 158 mg/dL — ABNORMAL HIGH (ref 70–99)

## 2024-02-08 SURGERY — LITHOTRIPSY, ESWL
Anesthesia: Moderate Sedation | Laterality: Left

## 2024-02-08 MED ORDER — DIAZEPAM 5 MG PO TABS
10.0000 mg | ORAL_TABLET | ORAL | Status: AC
  Administered 2024-02-08: 10 mg via ORAL

## 2024-02-08 MED ORDER — CEPHALEXIN 500 MG PO CAPS
500.0000 mg | ORAL_CAPSULE | Freq: Once | ORAL | Status: AC
  Administered 2024-02-08: 500 mg via ORAL

## 2024-02-08 MED ORDER — ONDANSETRON HCL 4 MG/2ML IJ SOLN
INTRAMUSCULAR | Status: AC
  Filled 2024-02-08: qty 2

## 2024-02-08 MED ORDER — ONDANSETRON HCL 4 MG/2ML IJ SOLN
4.0000 mg | Freq: Once | INTRAMUSCULAR | Status: AC
  Administered 2024-02-08: 4 mg via INTRAVENOUS

## 2024-02-08 MED ORDER — DIPHENHYDRAMINE HCL 25 MG PO CAPS
ORAL_CAPSULE | ORAL | Status: AC
  Filled 2024-02-08: qty 1

## 2024-02-08 MED ORDER — CEPHALEXIN 500 MG PO CAPS
ORAL_CAPSULE | ORAL | Status: AC
  Filled 2024-02-08: qty 1

## 2024-02-08 MED ORDER — CIPROFLOXACIN HCL 500 MG PO TABS
500.0000 mg | ORAL_TABLET | Freq: Once | ORAL | Status: AC
  Administered 2024-02-08: 500 mg via ORAL
  Filled 2024-02-08: qty 1

## 2024-02-08 MED ORDER — DIAZEPAM 5 MG PO TABS
ORAL_TABLET | ORAL | Status: AC
  Filled 2024-02-08: qty 2

## 2024-02-08 MED ORDER — SODIUM CHLORIDE 0.9 % IV SOLN: INTRAVENOUS | Status: DC

## 2024-02-08 MED ORDER — DIPHENHYDRAMINE HCL 25 MG PO CAPS
25.0000 mg | ORAL_CAPSULE | ORAL | Status: AC
  Administered 2024-02-08: 25 mg via ORAL

## 2024-02-08 NOTE — H&P (Signed)
 Patient took ASA yesterday a.m. at 0600.  We discussed due to the risk of bleeding/hematoma ASA products have to be stopped 72 hours prior to the procedure.  Will reschedule for next week

## 2024-02-08 NOTE — OR Nursing (Signed)
 Patient stated that son can not pick him up because he is at work. Patient asked to sign a form stating he could leave and not have hospital liable for him. Informed patient that since he took valium that he could not drive today, he had to have a ride come pick him up. Informed patient that he could get a DUI since he has drugs in his system. Patient informed staff that he takes oxycodone  every day anyway. This Clinical research associate informed patient he will need a driver to take him home. Patient noted to be walking around unit on the phone with a woman after conversation. Patient told staff that his wife will come pick him up. Patient asked staff to wheel him down to lobby to wait for her, this Clinical research associate informed patient that staff would take him down to lobby once his ride was here. IV has been removed and patient has all belongings including wallet, car keys, and phone back in his possession. Patient has been informed multiple times that he can not drive today.

## 2024-02-09 ENCOUNTER — Encounter: Payer: Self-pay | Admitting: Urology

## 2024-02-09 MED ORDER — OXYCODONE-ACETAMINOPHEN 5-325 MG PO TABS: 1.0000 | ORAL_TABLET | Freq: Four times a day (QID) | ORAL | 0 refills | Status: DC | PRN

## 2024-02-14 ENCOUNTER — Inpatient Hospital Stay
Admission: EM | Admit: 2024-02-14 | Discharge: 2024-02-17 | DRG: 623 | Disposition: A | Discharge status: Home / Self Care | Attending: Internal Medicine | Admitting: Internal Medicine

## 2024-02-14 ENCOUNTER — Encounter: Payer: Self-pay | Admitting: Emergency Medicine

## 2024-02-14 ENCOUNTER — Other Ambulatory Visit: Payer: Self-pay

## 2024-02-14 ENCOUNTER — Emergency Department

## 2024-02-14 DIAGNOSIS — E785 Hyperlipidemia, unspecified: Secondary | ICD-10-CM | POA: Diagnosis present

## 2024-02-14 DIAGNOSIS — Z7982 Long term (current) use of aspirin: Secondary | ICD-10-CM

## 2024-02-14 DIAGNOSIS — N200 Calculus of kidney: Secondary | ICD-10-CM | POA: Diagnosis present

## 2024-02-14 DIAGNOSIS — Z89421 Acquired absence of other right toe(s): Secondary | ICD-10-CM

## 2024-02-14 DIAGNOSIS — Z87442 Personal history of urinary calculi: Secondary | ICD-10-CM

## 2024-02-14 DIAGNOSIS — L97519 Non-pressure chronic ulcer of other part of right foot with unspecified severity: Secondary | ICD-10-CM | POA: Diagnosis present

## 2024-02-14 DIAGNOSIS — M86671 Other chronic osteomyelitis, right ankle and foot: Secondary | ICD-10-CM | POA: Diagnosis present

## 2024-02-14 DIAGNOSIS — Z79899 Other long term (current) drug therapy: Secondary | ICD-10-CM

## 2024-02-14 DIAGNOSIS — E1142 Type 2 diabetes mellitus with diabetic polyneuropathy: Secondary | ICD-10-CM | POA: Diagnosis present

## 2024-02-14 DIAGNOSIS — E11621 Type 2 diabetes mellitus with foot ulcer: Principal | ICD-10-CM | POA: Diagnosis present

## 2024-02-14 DIAGNOSIS — Z89411 Acquired absence of right great toe: Secondary | ICD-10-CM

## 2024-02-14 DIAGNOSIS — E1169 Type 2 diabetes mellitus with other specified complication: Secondary | ICD-10-CM | POA: Diagnosis not present

## 2024-02-14 DIAGNOSIS — I10 Essential (primary) hypertension: Secondary | ICD-10-CM | POA: Diagnosis present

## 2024-02-14 DIAGNOSIS — N201 Calculus of ureter: Secondary | ICD-10-CM

## 2024-02-14 DIAGNOSIS — Z794 Long term (current) use of insulin: Secondary | ICD-10-CM

## 2024-02-14 DIAGNOSIS — E66811 Obesity, class 1: Secondary | ICD-10-CM | POA: Diagnosis present

## 2024-02-14 DIAGNOSIS — Z8614 Personal history of Methicillin resistant Staphylococcus aureus infection: Secondary | ICD-10-CM

## 2024-02-14 DIAGNOSIS — E1151 Type 2 diabetes mellitus with diabetic peripheral angiopathy without gangrene: Secondary | ICD-10-CM | POA: Diagnosis present

## 2024-02-14 DIAGNOSIS — L03115 Cellulitis of right lower limb: Principal | ICD-10-CM | POA: Diagnosis present

## 2024-02-14 DIAGNOSIS — N179 Acute kidney failure, unspecified: Secondary | ICD-10-CM | POA: Diagnosis present

## 2024-02-14 DIAGNOSIS — Z8616 Personal history of COVID-19: Secondary | ICD-10-CM

## 2024-02-14 DIAGNOSIS — Z8249 Family history of ischemic heart disease and other diseases of the circulatory system: Secondary | ICD-10-CM

## 2024-02-14 DIAGNOSIS — Z89422 Acquired absence of other left toe(s): Secondary | ICD-10-CM

## 2024-02-14 DIAGNOSIS — Z6832 Body mass index (BMI) 32.0-32.9, adult: Secondary | ICD-10-CM

## 2024-02-14 DIAGNOSIS — Z7984 Long term (current) use of oral hypoglycemic drugs: Secondary | ICD-10-CM

## 2024-02-14 LAB — CBC WITH DIFFERENTIAL/PLATELET
Abs Immature Granulocytes: 0.1 10*3/uL — ABNORMAL HIGH (ref 0.00–0.07)
Basophils Absolute: 0.2 10*3/uL — ABNORMAL HIGH (ref 0.0–0.1)
Basophils Relative: 1 %
Eosinophils Absolute: 0.6 10*3/uL — ABNORMAL HIGH (ref 0.0–0.5)
Eosinophils Relative: 4 %
HCT: 40.1 % (ref 39.0–52.0)
Hemoglobin: 13.4 g/dL (ref 13.0–17.0)
Immature Granulocytes: 1 %
Lymphocytes Relative: 12 %
Lymphs Abs: 1.7 10*3/uL (ref 0.7–4.0)
MCH: 29.8 pg (ref 26.0–34.0)
MCHC: 33.4 g/dL (ref 30.0–36.0)
MCV: 89.1 fL (ref 80.0–100.0)
Monocytes Absolute: 1.8 10*3/uL — ABNORMAL HIGH (ref 0.1–1.0)
Monocytes Relative: 12 %
Neutro Abs: 10.2 10*3/uL — ABNORMAL HIGH (ref 1.7–7.7)
Neutrophils Relative %: 70 %
Platelets: 369 10*3/uL (ref 150–400)
RBC: 4.5 MIL/uL (ref 4.22–5.81)
RDW: 13.8 % (ref 11.5–15.5)
WBC: 14.5 10*3/uL — ABNORMAL HIGH (ref 4.0–10.5)
nRBC: 0 % (ref 0.0–0.2)

## 2024-02-14 LAB — COMPREHENSIVE METABOLIC PANEL WITH GFR
ALT: 19 U/L (ref 0–44)
AST: 26 U/L (ref 15–41)
Albumin: 3.3 g/dL — ABNORMAL LOW (ref 3.5–5.0)
Alkaline Phosphatase: 49 U/L (ref 38–126)
Anion gap: 10 (ref 5–15)
BUN: 26 mg/dL — ABNORMAL HIGH (ref 6–20)
CO2: 23 mmol/L (ref 22–32)
Calcium: 8.3 mg/dL — ABNORMAL LOW (ref 8.9–10.3)
Chloride: 102 mmol/L (ref 98–111)
Creatinine, Ser: 1.43 mg/dL — ABNORMAL HIGH (ref 0.61–1.24)
GFR, Estimated: 56 mL/min — ABNORMAL LOW (ref >60)
Glucose, Bld: 220 mg/dL — ABNORMAL HIGH (ref 70–99)
Potassium: 4 mmol/L (ref 3.5–5.1)
Sodium: 135 mmol/L (ref 135–145)
Total Bilirubin: 0.8 mg/dL (ref 0.0–1.2)
Total Protein: 7.7 g/dL (ref 6.5–8.1)

## 2024-02-14 MED ORDER — SODIUM CHLORIDE 0.9 % IV SOLN: INTRAVENOUS | Status: DC

## 2024-02-14 MED ORDER — ONDANSETRON HCL 4 MG/2ML IJ SOLN: 4.0000 mg | Freq: Once | INTRAMUSCULAR | Status: DC

## 2024-02-14 MED ORDER — DIAZEPAM 5 MG PO TABS: 10.0000 mg | ORAL_TABLET | ORAL | Status: DC

## 2024-02-14 MED ORDER — CEPHALEXIN 500 MG PO CAPS: 500.0000 mg | ORAL_CAPSULE | Freq: Once | ORAL | Status: DC

## 2024-02-14 MED ORDER — SODIUM CHLORIDE 0.9 % IV BOLUS (SEPSIS): 1000.0000 mL | Freq: Once | INTRAVENOUS | Status: AC

## 2024-02-14 MED ORDER — SODIUM CHLORIDE 0.9 % IV SOLN
2.0000 g | Freq: Once | INTRAVENOUS | Status: AC
  Filled 2024-02-14: qty 20

## 2024-02-14 MED ORDER — ONDANSETRON HCL 4 MG/2ML IJ SOLN
4.0000 mg | Freq: Once | INTRAMUSCULAR | Status: AC
  Filled 2024-02-14: qty 2

## 2024-02-14 MED ORDER — MORPHINE SULFATE (PF) 4 MG/ML IV SOLN
4.0000 mg | Freq: Once | INTRAVENOUS | Status: AC
  Filled 2024-02-14: qty 1

## 2024-02-14 MED ORDER — DIPHENHYDRAMINE HCL 25 MG PO CAPS: 25.0000 mg | ORAL_CAPSULE | ORAL | Status: DC

## 2024-02-14 NOTE — Addendum Note (Signed)
 Addended by: Sheryn Doom A on: 02/14/2024 12:45 PM   Modules accepted: Orders

## 2024-02-14 NOTE — ED Provider Notes (Signed)
 [facility] Provider Note    Event Date/Time   First MD Initiated Contact with Patient 02/14/24 2315     (approximate)   History   Leg Swelling   HPI  [name] is a 58 y.o. male with history of diabetes, neuropathy, right lower extremity transmetatarsal amputation who presents to the emergency department with 2 weeks of right lower extremity redness, warmth and swelling.  He states his blood sugar has been fluctuating.  He has not had any fever.  He denies prior history of DVT.  No chest pain or shortness of breath.  Has a chronic wound to the bottom of the foot.  He states he has not followed by podiatry or wound care.  Reports he changes the dressing once a day himself.  Patient also is concerned because he is scheduled for lithotripsy for left-sided ureterolithiasis in the morning.  He states that he does not want this to get canceled because he has been experiencing significant pain in the left flank that is keeping him from sleeping.  He denies any vomiting.  No urinary symptoms.  He does report having a lithotripsy scheduled about a week ago but that was canceled because he had taken aspirin  the day prior.  He denies any recent NSAID use.   History provided by patient.    Past Medical History:  Diagnosis Date   Acute prostatitis without hematuria 05/20/2022   Allergy    Atypical pneumonia 12/18/2022   Complication of anesthesia    pt states at [facility] he stopped breathing due to his sleep apnea-hard to wake up   COVID-19    Depression    Diabetic ulcer of both feet (HCC)    DM (diabetes mellitus), type 2 (HCC)    History of kidney stones    History of methicillin resistant staphylococcus aureus (MRSA) 09/2020   foot   Neuromuscular disorder (HCC)    neuropathy of back and bilateral feet   Obesity    Sleep apnea    does not use cpap    Past Surgical History:  Procedure Laterality Date   AMPUTATION Left    toe amputation    CYSTOSCOPY/URETEROSCOPY/HOLMIUM LASER/STENT PLACEMENT Right 07/29/2022   Procedure: CYSTOSCOPY/URETEROSCOPY/HOLMIUM LASER/STENT PLACEMENT;  Surgeon: Lawerence Pressman, MD;  Location: [facility] ORS;  Service: Urology;  Laterality: Right;   EXTRACORPOREAL SHOCK WAVE LITHOTRIPSY Left 02/08/2024   Procedure: LITHOTRIPSY, ESWL;  Surgeon: Geraline Knapp, MD;  Location: [facility] OR[facility];  Service: Urology;  Laterality: Left;   FOOT SURGERY Right 09/2012   cyst removed   LOWER EXTREMITY ANGIOGRAPHY Right 07/12/2023   Procedure: Lower Extremity Angiography;  Surgeon: Celso College, MD;  Location: [facility];  Service: Cardiovascular;  Laterality: Right;   SPINE SURGERY  2004   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 Authorizing Provider  aspirin  EC 81 MG tablet Take 81 mg by mouth daily. Swallow whole.    [provider]  atorvastatin  (LIPITOR) 20 MG tablet Take 1 tablet (20 mg total) by mouth daily. 05/12/23   Thersia Flax, MD  Continuous Glucose Receiver (DEXCOM G7 RECEIVER) DEVI Check blood sugars at least 3 times a day 07/15/23   Alphonsus Jeans, MD  Continuous Glucose Sensor (DEXCOM G7 SENSOR) MISC USE 1 SENSOR EVERY 10 DAYS FOR 30 DAYS. NO REFILLS. 01/23/24  Thersia Flax, MD  DULoxetine  (CYMBALTA ) 60 MG capsule TAKE 1 CAPSULE BY MOUTH EVERY DAY 02/05/24   Thersia Flax, MD  insulin  glargine, 2 Unit Dial, (TOUJEO  MAX SOLOSTAR) 300 UNIT/ML Solostar Pen Inject 20 Units into the skin daily. 11/29/23   Thersia Flax, MD  Insulin  Pen Needle (PEN NEEDLES) 32G X 6 MM MISC Use to give insulin  once daily. 12/05/23   Thersia Flax, MD  metFORMIN  (GLUCOPHAGE -XR) 500 MG 24 hr tablet TAKE 1 TABLET BY MOUTH EVERY DAY WITH BREAKFAST 11/29/23   Tullo, Teresa L, MD  Multiple Vitamins-Minerals (MULTIVITAMIN WITH MINERALS) tablet Take 1 tablet by mouth daily.    [provider]   naproxen  (NAPROSYN ) 500 MG tablet Take 1 tablet (500 mg total) by mouth 2 (two) times daily. 10/29/23   Ethlyn Herd, MD  ondansetron  (ZOFRAN -ODT) 4 MG disintegrating tablet Take 1 tablet (4 mg total) by mouth every 8 (eight) hours as needed for nausea or vomiting. 02/01/24   Vaillancourt, Samantha, PA-C  oxyCODONE  (ROXICODONE ) 5 MG immediate release tablet Take 1.5 tablets (7.5 mg total) by mouth daily. As needed for severe pain 01/11/24   Thersia Flax, MD  oxyCODONE  (ROXICODONE ) 5 MG immediate release tablet Take 1.5 tablets (7.5 mg total) by mouth daily. As needed for severe pain 01/11/24   Thersia Flax, MD  oxyCODONE -acetaminophen  (PERCOCET/ROXICET) 5-325 MG tablet Take 1-2 tablets by mouth every 6 (six) hours as needed for up to 5 days for severe pain (pain score 7-10). 02/09/24 02/14/24  Vaillancourt, Samantha, PA-C  SODIUM FLUORIDE 5000 SENSITIVE 1.1-5 % GEL Brush as directed by your provider. 06/20/23   [provider]  tamsulosin  (FLOMAX ) 0.4 MG CAPS capsule Take 1 capsule (0.4 mg total) by mouth daily. 02/01/24   Vaillancourt, Samantha, PA-C  telmisartan  (MICARDIS ) 40 MG tablet TAKE 1 TABLET BY MOUTH EVERYDAY AT BEDTIME 11/05/23   Thersia Flax, MD    Physical Exam   Triage Vital Signs: ED Triage Vitals [02/14/24 1925]  Encounter Vitals Group     BP (!) 152/86     Girls Systolic BP Percentile      Girls Diastolic BP Percentile      Boys Systolic BP Percentile      Boys Diastolic BP Percentile      Pulse Rate (!) 102     Resp 18     Temp 98.8 F (37.1 C)     Temp Source Oral     SpO2 98 %     Weight 225 lb (102.1 kg)     Height 5' 10 (1.778 m)     Head Circumference      Peak Flow      Pain Score 0     Pain Loc      Pain Education      Exclude from Growth Chart     Most recent vital signs: Vitals:   02/14/24 2345 02/15/24 0200  BP:  (!) 148/79  Pulse:  84  Resp:  16  Temp: 98.8 F (37.1 C) 98.3 F (36.8 C)  SpO2:  97%    CONSTITUTIONAL: Alert,  responds appropriately to questions.  HEAD: Normocephalic, atraumatic EYES: Conjunctivae clear, pupils appear equal, sclera nonicteric ENT: normal nose; moist mucous membranes NECK: Supple, normal ROM CARD: RRR; S1 and S2 appreciated RESP: Normal chest excursion without splinting or tachypnea; breath sounds clear and equal bilaterally; no wheezes, no rhonchi, no rales, no hypoxia or respiratory distress, speaking full sentences ABD/GI: Non-distended; soft,  non-tender, no rebound, no guarding, no peritoneal signs BACK: The back appears normal EXT: Right leg has increased soft tissue swelling compared to the left but compartments are soft.  His extremities are warm and well-perfused.  He has increased redness and warmth from the foot all the way up to just below the knee on the right side.  He has a large open wound to the bottom of his foot with exposed muscle that has a slightly foul odor when the bandage was removed but no purulent drainage or active bleeding.  He is status post transmetatarsal amputation on the right. SKIN: Normal color for age and race; warm; no rash on exposed skin NEURO: Moves all extremities equally, normal speech PSYCH: The patient's mood and manner are appropriate.         Patient gave verbal permission to utilize photo for medical documentation only. The image was not stored on any personal device.  ED Results / Procedures / Treatments   LABS: (all labs ordered are listed, but only abnormal results are displayed) Labs Reviewed  CBC WITH DIFFERENTIAL/PLATELET - Abnormal; Notable for the following components:      Result Value   WBC 14.5 (*)    Neutro Abs 10.2 (*)    Monocytes Absolute 1.8 (*)    Eosinophils Absolute 0.6 (*)    Basophils Absolute 0.2 (*)    Abs Immature Granulocytes 0.10 (*)    All other components within normal limits  COMPREHENSIVE METABOLIC PANEL WITH GFR - Abnormal; Notable for the following components:   Glucose, Bld 220 (*)    BUN  26 (*)    Creatinine, Ser 1.43 (*)    Calcium  8.3 (*)    Albumin 3.3 (*)    GFR, Estimated 56 (*)    All other components within normal limits  CULTURE, BLOOD (ROUTINE X 2)  CULTURE, BLOOD (ROUTINE X 2)  LACTIC ACID, PLASMA  URINALYSIS, W/ REFLEX TO CULTURE (INFECTION SUSPECTED)     EKG:     RADIOLOGY: My personal review and interpretation of imaging: X-ray shows no osteomyelitis.  Ultrasound shows no DVT.  I have personally reviewed all radiology reports.   US  Venous Img Lower Unilateral Right Result Date: 02/15/2024 EXAM: ULTRASOUND DUPLEX OF THE RIGHT LOWER EXTREMITY VEINS TECHNIQUE: Duplex ultrasound using B-mode/gray scaled imaging and Doppler spectral analysis and color flow was obtained of the deep venous structures of the right lower extremity. COMPARISON: None. CLINICAL HISTORY: RLE swelling. FINDINGS: The visualized veins of the lower extremity are patent and free of echogenic thrombus. The veins demonstrate good compressibility with normal color flow study and spectral analysis. IMPRESSION: 1. No evidence of DVT. Electronically signed by: Zadie Herter MD 02/15/2024 12:57 AM EDT RP Workstation: [id number]   DG Foot Complete Right Result Date: 02/15/2024 EXAM: 3 VIEW(S) XRAY OF THE RIGHT FOOT 02/15/2024 12:06:00 AM COMPARISON: MR right foot dated 07/11/2023. CLINICAL HISTORY: Eval for signs of osteomyelitis. States right foot swelling and redness x 3 weeks. 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 No cortical destruction to suggest acute osteomyelitis. SOFT TISSUES: Overlying mild soft tissue edema at the stump. IMPRESSION: 1. No evidence of acute osteomyelitis. 2. Status post transmetatarsal amputation, as above. Electronically signed by: Zadie Herter MD 02/15/2024 12:23 AM EDT RP Workstation: [id number]     PROCEDURES:  Critical Care performed: Yes, see critical care procedure note(s)   CRITICAL  CARE Performed by: Verneda Golder   Total critical care time: 30  minutes  Critical care time was exclusive of separately billable procedures and treating other patients.  Critical care was necessary to treat or prevent imminent or life-threatening deterioration.  Critical care was time spent personally by me on the following activities: development of treatment plan with patient and/or surrogate as well as nursing, discussions with consultants, evaluation of patient's response to treatment, examination of patient, obtaining history from patient or surrogate, ordering and performing treatments and interventions, ordering and review of laboratory studies, ordering and review of radiographic studies, pulse oximetry and re-evaluation of patient's condition.   Aaron Aas1-3 Lead EKG Interpretation  Performed by: Donnette Macmullen, Clover Dao, DO Authorized by: Javoni Lucken, Clover Dao, DO     Interpretation: normal     ECG rate:  84   ECG rate assessment: normal     Rhythm: sinus rhythm     Ectopy: none     Conduction: normal       IMPRESSION / MDM / ASSESSMENT AND PLAN / ED COURSE  I reviewed the triage vital signs and the nursing notes.    Patient here with right lower extremity cellulitis.  The patient is on the cardiac monitor to evaluate for evidence of arrhythmia and/or significant heart rate changes.   DIFFERENTIAL DIAGNOSIS (includes but not limited to):   Cellulitis, osteomyelitis, DVT, clinically no signs of abscess or necrotizing fasciitis   Patient's presentation is most consistent with acute presentation with potential threat to life or bodily function.   PLAN: Will obtain labs, cultures, ultrasound of the right lower extremity, x-ray of the right foot.  Will start on Rocephin, vancomycin  for broad coverage.  Will give pain medication.  Patient is very concerned about his lithotripsy getting rescheduled.  Will reach out to urology once further workup is back.  Will keep him n.p.o. at midnight and  avoid any NSAIDs.   MEDICATIONS GIVEN IN ED: Medications  vancomycin  (VANCOCIN ) IVPB 1000 mg/200 mL premix (0 mg Intravenous Stopped 02/15/24 0223)    Followed by  vancomycin  (VANCOREADY) IVPB 1500 mg/300 mL (1,500 mg Intravenous New Bag/Given 02/15/24 0225)  sodium chloride  0.9 % bolus 1,000 mL (0 mLs Intravenous Stopped 02/15/24 0108)  morphine  (PF) 4 MG/ML injection 4 mg (4 mg Intravenous Given 02/15/24 0012)  ondansetron  (ZOFRAN ) injection 4 mg (4 mg Intravenous Given 02/15/24 0012)  cefTRIAXone (ROCEPHIN) 2 g in sodium chloride  0.9 % 100 mL IVPB (0 g Intravenous Stopped 02/15/24 0108)     ED COURSE: Labs show leukocytosis of 14,000 with left shift.  Lactic normal.  Cultures pending.  Creatinine slightly elevated at 1.43.  He did receive a liter of fluids.  Pain seems to be better controlled after morphine .  Again holding any NSAIDs including Toradol .  X-ray and ultrasound reviewed and interpreted by myself and the radiologist.  No signs of osteomyelitis on x-ray and no DVT seen on ultrasound.  Secure chat message sent to Dr. Ace Holder with urology who is scheduled to perform patient's lithotripsy this morning.  She states that they would likely still be able to proceed with his lithotripsy today and patient is very pleased with this.  Will continue to avoid NSAIDs, anticoagulants.  Will discuss with the hospitalist for admission.   CONSULTS:  Consulted and discussed patient's case with hospitalist, Dr. Achilles Holes.  I have recommended admission and consulting physician agrees and will place admission orders.  Patient (and family if present) agree with this plan.   I reviewed all nursing notes, vitals, pertinent previous records.  All labs, EKGs, imaging ordered  have been independently reviewed and interpreted by myself.    OUTSIDE RECORDS REVIEWED: Reviewed recent urology notes.       FINAL CLINICAL IMPRESSION(S) / ED DIAGNOSES   Final diagnoses:  Cellulitis of right lower extremity   AKI (acute kidney injury) (HCC)     Rx / DC Orders   ED Discharge Orders     None        Note:  This document was prepared using Dragon voice recognition software and may include unintentional dictation errors.   Briella Hobday, Clover Dao, DO 02/15/24 [id number]

## 2024-02-14 NOTE — ED Triage Notes (Signed)
 Pt presents to the ED via POV with complaints of R leg swelling/redness x 3 weeks. Pt states that he wanted to rule out cellulitis. He notes wearing a brace to the R leg daily x several years. Of note, pt has a lithotripsy in the AM. A&Ox4 at this time. Denies fevers, chills, N/V/D, CP or SOB.

## 2024-02-15 ENCOUNTER — Encounter: Payer: Self-pay | Admitting: Certified Registered"

## 2024-02-15 ENCOUNTER — Emergency Department

## 2024-02-15 ENCOUNTER — Encounter
Admission: EM | Disposition: A | Discharge status: Home / Self Care | Payer: Self-pay | Source: Home / Self Care | Attending: Family Medicine

## 2024-02-15 ENCOUNTER — Inpatient Hospital Stay

## 2024-02-15 ENCOUNTER — Ambulatory Visit: Admission: RE | Admit: 2024-02-15 | Source: Home / Self Care | Admitting: Urology

## 2024-02-15 DIAGNOSIS — N201 Calculus of ureter: Secondary | ICD-10-CM

## 2024-02-15 DIAGNOSIS — Z8249 Family history of ischemic heart disease and other diseases of the circulatory system: Secondary | ICD-10-CM | POA: Diagnosis not present

## 2024-02-15 DIAGNOSIS — E785 Hyperlipidemia, unspecified: Secondary | ICD-10-CM | POA: Insufficient documentation

## 2024-02-15 DIAGNOSIS — E66811 Obesity, class 1: Secondary | ICD-10-CM | POA: Diagnosis present

## 2024-02-15 DIAGNOSIS — E1151 Type 2 diabetes mellitus with diabetic peripheral angiopathy without gangrene: Secondary | ICD-10-CM | POA: Diagnosis present

## 2024-02-15 DIAGNOSIS — E11621 Type 2 diabetes mellitus with foot ulcer: Secondary | ICD-10-CM | POA: Diagnosis present

## 2024-02-15 DIAGNOSIS — L03115 Cellulitis of right lower limb: Secondary | ICD-10-CM | POA: Diagnosis present

## 2024-02-15 DIAGNOSIS — Z7984 Long term (current) use of oral hypoglycemic drugs: Secondary | ICD-10-CM | POA: Diagnosis not present

## 2024-02-15 DIAGNOSIS — Z89421 Acquired absence of other right toe(s): Secondary | ICD-10-CM | POA: Diagnosis not present

## 2024-02-15 DIAGNOSIS — I10 Essential (primary) hypertension: Secondary | ICD-10-CM | POA: Insufficient documentation

## 2024-02-15 DIAGNOSIS — L97519 Non-pressure chronic ulcer of other part of right foot with unspecified severity: Secondary | ICD-10-CM | POA: Diagnosis present

## 2024-02-15 DIAGNOSIS — E1142 Type 2 diabetes mellitus with diabetic polyneuropathy: Secondary | ICD-10-CM | POA: Diagnosis present

## 2024-02-15 DIAGNOSIS — Z79899 Other long term (current) drug therapy: Secondary | ICD-10-CM | POA: Diagnosis not present

## 2024-02-15 DIAGNOSIS — Z8616 Personal history of COVID-19: Secondary | ICD-10-CM | POA: Diagnosis not present

## 2024-02-15 DIAGNOSIS — Z87442 Personal history of urinary calculi: Secondary | ICD-10-CM | POA: Diagnosis not present

## 2024-02-15 DIAGNOSIS — E1169 Type 2 diabetes mellitus with other specified complication: Secondary | ICD-10-CM | POA: Diagnosis present

## 2024-02-15 DIAGNOSIS — Z89411 Acquired absence of right great toe: Secondary | ICD-10-CM | POA: Diagnosis not present

## 2024-02-15 DIAGNOSIS — N179 Acute kidney failure, unspecified: Secondary | ICD-10-CM | POA: Diagnosis present

## 2024-02-15 DIAGNOSIS — Z89422 Acquired absence of other left toe(s): Secondary | ICD-10-CM | POA: Diagnosis not present

## 2024-02-15 DIAGNOSIS — Z6832 Body mass index (BMI) 32.0-32.9, adult: Secondary | ICD-10-CM | POA: Diagnosis not present

## 2024-02-15 DIAGNOSIS — Z8614 Personal history of Methicillin resistant Staphylococcus aureus infection: Secondary | ICD-10-CM | POA: Diagnosis not present

## 2024-02-15 DIAGNOSIS — M86671 Other chronic osteomyelitis, right ankle and foot: Secondary | ICD-10-CM | POA: Diagnosis present

## 2024-02-15 DIAGNOSIS — N200 Calculus of kidney: Secondary | ICD-10-CM | POA: Diagnosis present

## 2024-02-15 DIAGNOSIS — Z7982 Long term (current) use of aspirin: Secondary | ICD-10-CM | POA: Diagnosis not present

## 2024-02-15 DIAGNOSIS — Z794 Long term (current) use of insulin: Secondary | ICD-10-CM | POA: Diagnosis not present

## 2024-02-15 HISTORY — PX: EXTRACORPOREAL SHOCK WAVE LITHOTRIPSY: SHX1557

## 2024-02-15 LAB — BASIC METABOLIC PANEL WITH GFR
Anion gap: 7 (ref 5–15)
BUN: 23 mg/dL — ABNORMAL HIGH (ref 6–20)
CO2: 26 mmol/L (ref 22–32)
Calcium: 8.2 mg/dL — ABNORMAL LOW (ref 8.9–10.3)
Chloride: 102 mmol/L (ref 98–111)
Creatinine, Ser: 1.34 mg/dL — ABNORMAL HIGH (ref 0.61–1.24)
GFR, Estimated: >60 mL/min (ref >60)
Glucose, Bld: 180 mg/dL — ABNORMAL HIGH (ref 70–99)
Potassium: 4.3 mmol/L (ref 3.5–5.1)
Sodium: 135 mmol/L (ref 135–145)

## 2024-02-15 LAB — URINALYSIS, W/ REFLEX TO CULTURE (INFECTION SUSPECTED)
Bilirubin Urine: NEGATIVE
Glucose, UA: 50 mg/dL — AB
Hgb urine dipstick: NEGATIVE
Ketones, ur: NEGATIVE mg/dL
Leukocytes,Ua: NEGATIVE
Nitrite: NEGATIVE
Protein, ur: NEGATIVE mg/dL
Specific Gravity, Urine: 1.016 (ref 1.005–1.030)
Squamous Epithelial / HPF: 0 /HPF (ref 0–5)
pH: 6 (ref 5.0–8.0)

## 2024-02-15 LAB — GLUCOSE, CAPILLARY
Glucose-Capillary: 189 mg/dL — ABNORMAL HIGH (ref 70–99)
Glucose-Capillary: 218 mg/dL — ABNORMAL HIGH (ref 70–99)

## 2024-02-15 LAB — CBC
HCT: 41.5 % (ref 39.0–52.0)
Hemoglobin: 13.4 g/dL (ref 13.0–17.0)
MCH: 29.3 pg (ref 26.0–34.0)
MCHC: 32.3 g/dL (ref 30.0–36.0)
MCV: 90.6 fL (ref 80.0–100.0)
Platelets: 326 10*3/uL (ref 150–400)
RBC: 4.58 MIL/uL (ref 4.22–5.81)
RDW: 13.8 % (ref 11.5–15.5)
WBC: 12.4 10*3/uL — ABNORMAL HIGH (ref 4.0–10.5)
nRBC: 0 % (ref 0.0–0.2)

## 2024-02-15 LAB — CBG MONITORING, ED
Glucose-Capillary: 164 mg/dL — ABNORMAL HIGH (ref 70–99)
Glucose-Capillary: 135 mg/dL — ABNORMAL HIGH (ref 70–99)
Glucose-Capillary: 119 mg/dL — ABNORMAL HIGH (ref 70–99)

## 2024-02-15 LAB — PROTIME-INR
INR: 1.2 (ref 0.8–1.2)
Prothrombin Time: 14.9 s (ref 11.4–15.2)

## 2024-02-15 LAB — CORTISOL-AM, BLOOD: Cortisol - AM: 14 ug/dL (ref 6.7–22.6)

## 2024-02-15 LAB — HEMOGLOBIN A1C
Hgb A1c MFr Bld: 6.7 % — ABNORMAL HIGH (ref 4.8–5.6)
Mean Plasma Glucose: 145.59 mg/dL

## 2024-02-15 LAB — LACTIC ACID, PLASMA: Lactic Acid, Venous: 1.4 mmol/L (ref 0.5–1.9)

## 2024-02-15 SURGERY — LITHOTRIPSY, ESWL
Anesthesia: Moderate Sedation | Laterality: Left

## 2024-02-15 MED ORDER — HYDROMORPHONE HCL 1 MG/ML IJ SOLN
0.5000 mg | INTRAMUSCULAR | Status: DC | PRN
  Administered 2024-02-17: 0.5 mg via INTRAVENOUS
  Filled 2024-02-15: qty 0.5

## 2024-02-15 MED ORDER — VANCOMYCIN HCL IN DEXTROSE 1-5 GM/200ML-% IV SOLN: 1000.0000 mg | Freq: Once | INTRAVENOUS | Status: DC

## 2024-02-15 MED ORDER — ONDANSETRON HCL 4 MG PO TABS: 4.0000 mg | ORAL_TABLET | Freq: Four times a day (QID) | ORAL | Status: DC | PRN

## 2024-02-15 MED ORDER — ADULT MULTIVITAMIN W/MINERALS CH
1.0000 | ORAL_TABLET | Freq: Every day | ORAL | Status: DC
  Administered 2024-02-16 – 2024-02-17 (×2): 1 via ORAL
  Filled 2024-02-15: qty 1

## 2024-02-15 MED ORDER — VANCOMYCIN HCL 1500 MG/300ML IV SOLN
1500.0000 mg | Freq: Once | INTRAVENOUS | Status: AC
  Filled 2024-02-15: qty 300

## 2024-02-15 MED ORDER — HYDROMORPHONE HCL 1 MG/ML IJ SOLN
0.5000 mg | Freq: Once | INTRAMUSCULAR | Status: AC
  Filled 2024-02-15: qty 0.5

## 2024-02-15 MED ORDER — ADULT MULTIVITAMIN W/MINERALS CH
ORAL_TABLET | ORAL | Status: AC
  Filled 2024-02-15: qty 1

## 2024-02-15 MED ORDER — TAMSULOSIN HCL 0.4 MG PO CAPS
0.4000 mg | ORAL_CAPSULE | Freq: Every day | ORAL | Status: DC
  Administered 2024-02-16 – 2024-02-17 (×2): 0.4 mg via ORAL
  Filled 2024-02-15: qty 1

## 2024-02-15 MED ORDER — TAMSULOSIN HCL 0.4 MG PO CAPS
ORAL_CAPSULE | ORAL | Status: AC
  Filled 2024-02-15: qty 1

## 2024-02-15 MED ORDER — ACETAMINOPHEN 650 MG RE SUPP: 650.0000 mg | Freq: Four times a day (QID) | RECTAL | Status: DC | PRN

## 2024-02-15 MED ORDER — INSULIN ASPART 100 UNIT/ML IJ SOLN
0.0000 [IU] | Freq: Three times a day (TID) | INTRAMUSCULAR | Status: DC
  Administered 2024-02-16: 2 [IU] via SUBCUTANEOUS
  Administered 2024-02-16: 5 [IU] via SUBCUTANEOUS
  Administered 2024-02-16 – 2024-02-17 (×2): 3 [IU] via SUBCUTANEOUS
  Filled 2024-02-15 (×4): qty 1

## 2024-02-15 MED ORDER — SODIUM CHLORIDE 0.9 % IV BOLUS (SEPSIS): 1000.0000 mL | Freq: Once | INTRAVENOUS | Status: DC

## 2024-02-15 MED ORDER — OXYCODONE HCL 5 MG PO TABS
5.0000 mg | ORAL_TABLET | ORAL | Status: DC | PRN
  Administered 2024-02-15 – 2024-02-16 (×2): 5 mg via ORAL
  Filled 2024-02-15 (×3): qty 1

## 2024-02-15 MED ORDER — MAGNESIUM HYDROXIDE 400 MG/5ML PO SUSP: 30.0000 mL | Freq: Every day | ORAL | Status: DC | PRN

## 2024-02-15 MED ORDER — VANCOMYCIN HCL IN DEXTROSE 1-5 GM/200ML-% IV SOLN
1000.0000 mg | Freq: Once | INTRAVENOUS | Status: AC
  Filled 2024-02-15: qty 200

## 2024-02-15 MED ORDER — LACTATED RINGERS IV SOLN
150.0000 mL/h | INTRAVENOUS | Status: AC
Start: 2024-02-15 — End: 2024-02-15

## 2024-02-15 MED ORDER — SODIUM CHLORIDE 0.9 % IV SOLN
2.0000 g | INTRAVENOUS | Status: DC
  Administered 2024-02-16: 2 g via INTRAVENOUS
  Filled 2024-02-15 (×2): qty 20

## 2024-02-15 MED ORDER — ATORVASTATIN CALCIUM 20 MG PO TABS
20.0000 mg | ORAL_TABLET | Freq: Every day | ORAL | Status: DC
  Administered 2024-02-15 – 2024-02-17 (×3): 20 mg via ORAL
  Filled 2024-02-15: qty 1

## 2024-02-15 MED ORDER — ATORVASTATIN CALCIUM 20 MG PO TABS
ORAL_TABLET | ORAL | Status: AC
  Filled 2024-02-15: qty 1

## 2024-02-15 MED ORDER — ACETAMINOPHEN 325 MG PO TABS
650.0000 mg | ORAL_TABLET | Freq: Four times a day (QID) | ORAL | Status: AC | PRN
Start: 2024-02-15 — End: ?

## 2024-02-15 MED ORDER — INSULIN GLARGINE-YFGN 100 UNIT/ML ~~LOC~~ SOLN
20.0000 [IU] | Freq: Every day | SUBCUTANEOUS | Status: DC
  Administered 2024-02-16 – 2024-02-17 (×2): 20 [IU] via SUBCUTANEOUS
  Filled 2024-02-15 (×4): qty 0.2

## 2024-02-15 MED ORDER — VANCOMYCIN HCL 2000 MG/400ML IV SOLN
2000.0000 mg | INTRAVENOUS | Status: DC
  Administered 2024-02-16: 2000 mg via INTRAVENOUS
  Filled 2024-02-15: qty 400

## 2024-02-15 MED ORDER — DULOXETINE HCL 30 MG PO CPEP
60.0000 mg | ORAL_CAPSULE | Freq: Every day | ORAL | Status: DC
  Administered 2024-02-16 – 2024-02-17 (×2): 60 mg via ORAL
  Filled 2024-02-15 (×3): qty 2

## 2024-02-15 MED ORDER — TRAZODONE HCL 50 MG PO TABS
25.0000 mg | ORAL_TABLET | Freq: Every evening | ORAL | Status: DC | PRN
  Filled 2024-02-15: qty 1

## 2024-02-15 MED ORDER — INSULIN ASPART 100 UNIT/ML IJ SOLN
0.0000 [IU] | Freq: Every day | INTRAMUSCULAR | Status: DC
  Filled 2024-02-15: qty 1

## 2024-02-15 MED ORDER — ONDANSETRON HCL 4 MG/2ML IJ SOLN: 4.0000 mg | Freq: Four times a day (QID) | INTRAMUSCULAR | Status: DC | PRN

## 2024-02-15 NOTE — H&P (Signed)
 [place]   PATIENT NAME: [name]    MR#:  [id number]  DATE OF BIRTH:  1965-10-10  DATE OF ADMISSION:  02/14/2024  PRIMARY CARE PHYSICIAN: Thersia Flax, MD   Patient is coming from: Home  REQUESTING/REFERRING PHYSICIAN: Ward, Clover Dao, DO  CHIEF COMPLAINT:   Chief Complaint  Patient presents with   Leg Swelling    HISTORY OF PRESENT ILLNESS:  [name] is a 57 y.o. Caucasian male with medical history significant for depression, type 2 diabetes mellitus, right foot ulcer, OSA, and kidney stones who had a plan for left lithotripsy this a.m., presented to the emergency room with worsening right lower extremity pain and swelling with associated tenderness and erythema.  No fever or chills.  No nausea or vomiting or abdominal pain.  No chest pain or palpitations.  No cough or wheezing or dyspnea.  No dysuria, oliguria or hematuria or flank pain.  No other bleeding diathesis.  ED Course: When he came to the ER, vital signs revealed a BP of 152/86 with heart rate of 102 and otherwise normal vital signs.  Labs revealed blood glucose of 220 with a BUN of 26 and creatinine 1.43 compared to 22/0.76 on 11/03/2023, albumin of 3.3.  CBC showed leukocytosis of 14.5 with neutrophilia. EKG as reviewed by me : None. Imaging: Right foot x-ray showed no evidence for acute osteomyelitis.  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  The patient was given 4 mg of IV morphine  sulfate and 4 mg of IV Zofran , 1 L bolus of IV normal saline, 2 g of IV Rocephin as well as IV vancomycin .  He will be admitted to a medical bed for further evaluation and management. PAST MEDICAL HISTORY:   Past Medical History:  Diagnosis Date   Acute prostatitis without hematuria 05/20/2022   Allergy    Atypical pneumonia 12/18/2022   Complication of anesthesia    pt states at [facility] he stopped breathing due to his sleep apnea-hard to wake up   COVID-19    Depression    Diabetic ulcer of both feet (HCC)     DM (diabetes mellitus), type 2 (HCC)    History of kidney stones    History of methicillin resistant staphylococcus aureus (MRSA) 09/2020   foot   Neuromuscular disorder (HCC)    neuropathy of back and bilateral feet   Obesity    Sleep apnea    does not use cpap    PAST SURGICAL HISTORY:   Past Surgical History:  Procedure Laterality Date   AMPUTATION Left    toe amputation   CYSTOSCOPY/URETEROSCOPY/HOLMIUM LASER/STENT PLACEMENT Right 07/29/2022   Procedure: CYSTOSCOPY/URETEROSCOPY/HOLMIUM LASER/STENT PLACEMENT;  Surgeon: Lawerence Pressman, MD;  Location: [facility];  Service: Urology;  Laterality: Right;   EXTRACORPOREAL SHOCK WAVE LITHOTRIPSY Left 02/08/2024   Procedure: LITHOTRIPSY, ESWL;  Surgeon: Geraline Knapp, MD;  Location: [facility];  Service: Urology;  Laterality: Left;   FOOT SURGERY Right 09/2012   cyst removed   LOWER EXTREMITY ANGIOGRAPHY Right 07/12/2023   Procedure: Lower Extremity Angiography;  Surgeon: Celso College, MD;  Location: [facility];  Service: Cardiovascular;  Laterality: Right;   SPINE SURGERY  2004   nerve damage in spine   TOE AMPUTATION Right 06/30/2016   5 toes    SOCIAL HISTORY:   Social History   Tobacco Use   Smoking status: Never    Passive exposure: Never   Smokeless tobacco: Never  Substance Use Topics  Alcohol use: Never    FAMILY HISTORY:   Family History  Problem Relation Age of Onset   Heart disease Father    Stroke Father    Heart attack Father 45   Heart disease Maternal Grandmother    Stroke Maternal Grandmother    Heart disease Paternal Grandfather    Stroke Paternal Grandfather     DRUG ALLERGIES:  No Known Allergies  REVIEW OF SYSTEMS:   ROS As per history of present illness. All pertinent systems were reviewed above. Constitutional, HEENT, cardiovascular, respiratory, GI, GU, musculoskeletal, neuro, psychiatric, endocrine, integumentary and hematologic systems were reviewed and are otherwise  negative/unremarkable except for positive findings mentioned above in the HPI.   MEDICATIONS AT HOME:   Prior to Admission medications   Medication Sig Start Date End Date Taking? Authorizing Provider  aspirin  EC 81 MG tablet Take 81 mg by mouth daily. Swallow whole.   Yes [provider]  atorvastatin  (LIPITOR) 20 MG tablet Take 1 tablet (20 mg total) by mouth daily. 05/12/23  Yes Thersia Flax, MD  DULoxetine  (CYMBALTA ) 60 MG capsule TAKE 1 CAPSULE BY MOUTH EVERY DAY 02/05/24  Yes Thersia Flax, MD  insulin  glargine, 2 Unit Dial, (TOUJEO  MAX SOLOSTAR) 300 UNIT/ML Solostar Pen Inject 20 Units into the skin daily. 11/29/23  Yes Thersia Flax, MD  metFORMIN  (GLUCOPHAGE -XR) 500 MG 24 hr tablet TAKE 1 TABLET BY MOUTH EVERY DAY WITH BREAKFAST 11/29/23  Yes Tullo, Teresa L, MD  Multiple Vitamins-Minerals (MULTIVITAMIN WITH MINERALS) tablet Take 1 tablet by mouth daily.   Yes [provider]  naproxen  (NAPROSYN ) 500 MG tablet Take 1 tablet (500 mg total) by mouth 2 (two) times daily. Patient taking differently: Take 500 mg by mouth 2 (two) times daily as needed for mild pain (pain score 1-3). 10/29/23  Yes Ethlyn Herd, MD  oxyCODONE  (ROXICODONE ) 5 MG immediate release tablet Take 1.5 tablets (7.5 mg total) by mouth daily. As needed for severe pain 01/11/24  Yes Thersia Flax, MD  SODIUM FLUORIDE 5000 SENSITIVE 1.1-5 % GEL Brush as directed by your provider. 06/20/23  Yes [provider]  tamsulosin  (FLOMAX ) 0.4 MG CAPS capsule Take 1 capsule (0.4 mg total) by mouth daily. 02/01/24  Yes Vaillancourt, Samantha, PA-C  telmisartan  (MICARDIS ) 40 MG tablet TAKE 1 TABLET BY MOUTH EVERYDAY AT BEDTIME Patient taking differently: Take 40 mg by mouth daily. 11/05/23  Yes Thersia Flax, MD  Continuous Glucose Receiver (DEXCOM G7 RECEIVER) DEVI Check blood sugars at least 3 times a day 07/15/23   Alphonsus Jeans, MD  Continuous Glucose Sensor (DEXCOM G7 SENSOR) MISC USE 1 SENSOR  EVERY 10 DAYS FOR 30 DAYS. NO REFILLS. 01/23/24   Thersia Flax, MD  Insulin  Pen Needle (PEN NEEDLES) 32G X 6 MM MISC Use to give insulin  once daily. 12/05/23   Thersia Flax, MD  ondansetron  (ZOFRAN -ODT) 4 MG disintegrating tablet Take 1 tablet (4 mg total) by mouth every 8 (eight) hours as needed for nausea or vomiting. Patient not taking: Reported on 02/15/2024 02/01/24   Vaillancourt, Samantha, PA-C  oxyCODONE  (ROXICODONE ) 5 MG immediate release tablet Take 1.5 tablets (7.5 mg total) by mouth daily. As needed for severe pain 01/11/24   Thersia Flax, MD      VITAL SIGNS:  Blood pressure (!) 127/58, pulse 74, temperature 98.3 F (36.8 C), temperature source Oral, resp. rate 16, height 5' 10 (1.778 m), weight 102.1 kg, SpO2 97%.  PHYSICAL EXAMINATION:  Physical Exam  GENERAL:  58 y.o.-year-old, Asian male patient lying in the bed with no acute distress.  EYES: Pupils equal, round, reactive to light and accommodation. No scleral icterus. Extraocular muscles intact.  HEENT: Head atraumatic, normocephalic. Oropharynx and nasopharynx clear.  NECK:  Supple, no jugular venous distention. No thyroid  enlargement, no tenderness.  LUNGS: Normal breath sounds bilaterally, no wheezing, rales,rhonchi or crepitation. No use of accessory muscles of respiration.  CARDIOVASCULAR: Regular rate and rhythm, S1, S2 normal. No murmurs, rubs, or gallops.  ABDOMEN: Soft, nondistended, nontender. Bowel sounds present. No organomegaly or mass.  EXTREMITIES: Right lower extremity edema with erythema and tenderness cyanosis, or clubbing.  NEUROLOGIC: Cranial nerves II through XII are intact. Muscle strength 5/5 in all extremities. Sensation intact. Gait not checked.  PSYCHIATRIC: The patient is alert and oriented x 3.  Normal affect and good eye contact. SKIN: Right lower extremity edema, induration, erythema, warmth and tenderness, status post metatarsal amputation and right plantar foot ulcer, with no other no  obvious rash, lesion, or ulcer.       LABORATORY PANEL:   CBC Recent Labs  Lab 02/14/24 1925  WBC 14.5*  HGB 13.4  HCT 40.1  PLT 369   ------------------------------------------------------------------------------------------------------------------  Chemistries  Recent Labs  Lab 02/14/24 1925  NA 135  K 4.0  CL 102  CO2 23  GLUCOSE 220*  BUN 26*  CREATININE 1.43*  CALCIUM  8.3*  AST 26  ALT 19  ALKPHOS 49  BILITOT 0.8   ------------------------------------------------------------------------------------------------------------------  Cardiac Enzymes No results for input(s): TROPONINI in the last 168 hours. ------------------------------------------------------------------------------------------------------------------  RADIOLOGY:  US  Venous Img Lower Unilateral Right Result Date: 02/15/2024 EXAM: ULTRASOUND DUPLEX OF THE RIGHT LOWER EXTREMITY VEINS TECHNIQUE: Duplex ultrasound using B-mode/gray scaled imaging and Doppler spectral analysis and color flow was obtained of the deep venous structures of the right lower extremity. COMPARISON: None. CLINICAL HISTORY: RLE swelling. FINDINGS: The visualized veins of the lower extremity are patent and free of echogenic thrombus. The veins demonstrate good compressibility with normal color flow study and spectral analysis. IMPRESSION: 1. No evidence of DVT. Electronically signed by: Zadie Herter MD 02/15/2024 12:57 AM EDT RP Workstation: [id number]   DG Foot Complete Right Result Date: 02/15/2024 EXAM: 3 VIEW(S) XRAY OF THE RIGHT FOOT 02/15/2024 12:06:00 AM COMPARISON: MR right foot dated 07/11/2023. CLINICAL HISTORY: Eval for signs of osteomyelitis. States right foot swelling and redness x 3 weeks. 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 No cortical destruction to suggest acute osteomyelitis. SOFT TISSUES: Overlying mild soft tissue edema at the stump.  IMPRESSION: 1. No evidence of acute osteomyelitis. 2. Status post transmetatarsal amputation, as above. Electronically signed by: Zadie Herter MD 02/15/2024 12:23 AM EDT RP Workstation: [id number]      IMPRESSION AND PLAN:  Assessment and Plan: * Cellulitis of right lower extremity - The patient admitted to a medical bed. - Will continue antibiotic therapy with IV vancomycin  and Rocephin. - Pain management will be provided. - He will have warm compresses. - Podiatry consult will be obtained. - I notified Dr. Larey Plenty about the patient.  Kidney stone on left side - He will have lithotripsy this a.m. as planned by Dr. Ace Holder. - He is kept NPO. - Avoiding blood thinners for now.  Type 2 diabetes mellitus with peripheral neuropathy (HCC) - The patient will be placed on supplemental coverage with NovoLog . - Will continue basal coverage. - Will hold off metformin .  Essential hypertension - Continue antihypertensive therapy.  Dyslipidemia - We will continue statin therapy.   DVT prophylaxis: SCDs. Advanced Care Planning:  Code Status: full code. Family Communication:  The plan of care was discussed in details with the patient (and family). I answered all questions. The patient agreed to proceed with the above mentioned plan. Further management will depend upon hospital course. Disposition Plan: Back to previous home environment Consults called: Podiatry.   All the records are reviewed and case discussed with ED provider.  Status is: Inpatient  At the time of the admission, it appears that the appropriate admission status for this patient is inpatient.  This is judged to be reasonable and necessary in order to provide the required intensity of service to ensure the patient's safety given the presenting symptoms, physical exam findings and initial radiographic and laboratory data in the context of comorbid conditions.  The patient requires inpatient status due to high intensity of  service, high risk of further deterioration and high frequency of surveillance required.  I certify that at the time of admission, it is my clinical judgment that the patient will require inpatient hospital care extending more than 2 midnights.                            Dispo: The patient is from: Home              Anticipated d/c is to: Home              Patient currently is not medically stable to d/c.              Difficult to place patient: No  Virgene Griffin M.D on 02/15/2024 at 4:22 AM  [facility]   From 7 PM-7 AM, contact night-coverage [email or web address]  CC: Primary care physician; Thersia Flax, MD

## 2024-02-15 NOTE — Consult Note (Signed)
 PODIATRY / FOOT AND ANKLE SURGERY CONSULTATION NOTE  Requesting Physician: Dr. Achilles Holes  Reason for consult: Right foot wound/cellulitis  HPI: [name] is a 58 y.o. male who presents with a chronic nonhealing wound to the right foot.  He does have chronic osteomyelitis to the right foot that has been treated with antibiotic therapy.  He has noted that he has had this wound for a number of months to even a couple of years now.  He has seen Dr. Roderic City in the past.  He was advised that he would likely need an amputation of his right foot and ankle due to the severity of the wound.  He has not been seen in our outpatient clinic in at least 6 months now.  Patient was admitted to the hospital due to cellulitis of the right lower extremity and concerns for wound infection.  Patient presents today resting in bed comfortably.  He notes no pain to the area.  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  He has been applying it appears to be Xeroform to the area with dry sterile dressings.  He does work at the [employer] and notes that he cannot stay off of his feet needs to continue to work.  PMHx:  Past Medical History:  Diagnosis Date   Acute prostatitis without hematuria 05/20/2022   Allergy    Atypical pneumonia 12/18/2022   Complication of anesthesia    pt states at [facility] he stopped breathing due to his sleep apnea-hard to wake up   COVID-19    Depression    Diabetic ulcer of both feet (HCC)    DM (diabetes mellitus), type 2 (HCC)    History of kidney stones    History of methicillin resistant staphylococcus aureus (MRSA) 09/2020   foot   Neuromuscular disorder (HCC)    neuropathy of back and bilateral feet   Obesity    Sleep apnea    does not use cpap    Surgical Hx:  Past Surgical History:  Procedure Laterality Date   AMPUTATION Left    toe amputation   CYSTOSCOPY/URETEROSCOPY/HOLMIUM LASER/STENT PLACEMENT Right 07/29/2022   Procedure:  CYSTOSCOPY/URETEROSCOPY/HOLMIUM LASER/STENT PLACEMENT;  Surgeon: Lawerence Pressman, MD;  Location: [facility] ORS;  Service: Urology;  Laterality: Right;   EXTRACORPOREAL SHOCK WAVE LITHOTRIPSY Left 02/08/2024   Procedure: LITHOTRIPSY, ESWL;  Surgeon: Geraline Knapp, MD;  Location: [facility] OR[facility];  Service: Urology;  Laterality: Left;   FOOT SURGERY Right 09/2012   cyst removed   LOWER EXTREMITY ANGIOGRAPHY Right 07/12/2023   Procedure: Lower Extremity Angiography;  Surgeon: Celso College, MD;  Location: [facility] INVASIVE [facility] LAB;  Service: Cardiovascular;  Laterality: Right;   SPINE SURGERY  2004   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 He has never been exposed to tobacco smoke. He has never used smokeless tobacco. He reports that he does not drink alcohol and does not use drugs.  Allergies: No Known Allergies   Medications Prior to Admission  Medication  Sig Dispense Refill   aspirin  EC 81 MG tablet Take 81 mg by mouth daily. Swallow whole.     atorvastatin  (LIPITOR) 20 MG tablet Take 1 tablet (20 mg total) by mouth daily. 90 tablet 3   DULoxetine  (CYMBALTA ) 60 MG capsule TAKE 1 CAPSULE BY MOUTH EVERY DAY 90 capsule 2   insulin  glargine, 2 Unit Dial, (TOUJEO  MAX SOLOSTAR) 300 UNIT/ML Solostar Pen Inject 20 Units into the skin daily. 15 mL 0   metFORMIN  (GLUCOPHAGE -XR) 500 MG 24 hr tablet TAKE 1 TABLET BY MOUTH EVERY DAY WITH BREAKFAST 90 tablet 1   Multiple Vitamins-Minerals (MULTIVITAMIN WITH MINERALS) tablet Take 1 tablet by mouth daily.     naproxen  (NAPROSYN ) 500 MG tablet Take 1 tablet (500 mg total) by mouth 2 (two) times daily. (Patient taking  differently: Take 500 mg by mouth 2 (two) times daily as needed for mild pain (pain score 1-3).) 20 tablet 0   oxyCODONE  (ROXICODONE ) 5 MG immediate release tablet Take 1.5 tablets (7.5 mg total) by mouth daily. As needed for severe pain 45 tablet 0   oxyCODONE -acetaminophen  (PERCOCET/ROXICET) 5-325 MG tablet Take 1-2 tablets by mouth every 6 (six) hours as needed for up to 5 days for severe pain (pain score 7-10). 15 tablet 0   SODIUM FLUORIDE 5000 SENSITIVE 1.1-5 % GEL Brush as directed by your provider.     tamsulosin  (FLOMAX ) 0.4 MG CAPS capsule Take 1 capsule (0.4 mg total) by mouth daily. 30 capsule 0   telmisartan  (MICARDIS ) 40 MG tablet TAKE 1 TABLET BY MOUTH EVERYDAY AT BEDTIME (Patient taking differently: Take 40 mg by mouth daily.) 90 tablet 1   Continuous Glucose Receiver (DEXCOM G7 RECEIVER) DEVI Check blood sugars at least 3 times a day 1 each 0   Continuous Glucose Sensor (DEXCOM G7 SENSOR) MISC USE 1 SENSOR EVERY 10 DAYS FOR 30 DAYS. NO REFILLS. 3 each 5   Insulin  Pen Needle (PEN NEEDLES) 32G X 6 MM MISC Use to give insulin  once daily. 100 each 3   ondansetron  (ZOFRAN -ODT) 4 MG disintegrating tablet Take 1 tablet (4 mg total) by mouth every 8 (eight) hours as needed for nausea or vomiting. (Patient not taking: Reported on 02/15/2024) 20 tablet 0   oxyCODONE  (ROXICODONE ) 5 MG immediate release tablet Take 1.5 tablets (7.5 mg total) by mouth daily. As needed for severe pain 45 tablet 0    Physical Exam: General: Alert and oriented.  No apparent distress. Vascular: DP/PT pulses +1 bilaterally, no hair growth present bilateral lower extremities.  Moderate edema and erythema present to the right forefoot and ankle extending up the leg to the lower knee area.  Neuro: Light touch sensation absent to bilateral lower extremities.  Derm: Plantar right foot ulceration measures 5 cm x 6 cm x 0.2 cm, appears to have some tenderness material likely consistent with plantar fascia within a couple  of places of the wound.  Overall wound appears to be fairly granular, associated erythema and edema present with a fair amount of serous drainage, no purulence.  Erythema and edema extends from the wound all the way up to the knee.       MSK: Foot right side.  Right TMA  Results for orders placed or performed during the hospital encounter of 02/14/24 (from the past 48 hours)  CBC with Differential     Status: Abnormal   Collection Time: 02/14/24  7:25 PM  Result Value Ref Range   WBC 14.5 (H) 4.0 - 10.5 K/uL  RBC 4.50 4.22 - 5.81 MIL/uL   Hemoglobin 13.4 13.0 - 17.0 g/dL   HCT 16.1 09.6 - 04.5 %   MCV 89.1 80.0 - 100.0 fL   MCH 29.8 26.0 - 34.0 pg   MCHC 33.4 30.0 - 36.0 g/dL   RDW 40.9 81.1 - 91.4 %   Platelets 369 150 - 400 K/uL   nRBC 0.0 0.0 - 0.2 %   Neutrophils Relative % 70 %   Neutro Abs 10.2 (H) 1.7 - 7.7 K/uL   Lymphocytes Relative 12 %   Lymphs Abs 1.7 0.7 - 4.0 K/uL   Monocytes Relative 12 %   Monocytes Absolute 1.8 (H) 0.1 - 1.0 K/uL   Eosinophils Relative 4 %   Eosinophils Absolute 0.6 (H) 0.0 - 0.5 K/uL   Basophils Relative 1 %   Basophils Absolute 0.2 (H) 0.0 - 0.1 K/uL   Immature Granulocytes 1 %   Abs Immature Granulocytes 0.10 (H) 0.00 - 0.07 K/uL    Comment: Performed at [facility], [address], [place], [place] [address]  Comprehensive metabolic panel     Status: Abnormal   Collection Time: 02/14/24  7:25 PM  Result Value Ref Range   Sodium 135 135 - 145 mmol/L   Potassium 4.0 3.5 - 5.1 mmol/L   Chloride 102 98 - 111 mmol/L   CO2 23 22 - 32 mmol/L   Glucose, Bld 220 (H) 70 - 99 mg/dL    Comment: Glucose reference range applies only to samples taken after fasting for at least 8 hours.   BUN 26 (H) 6 - 20 mg/dL   Creatinine, Ser 6.21 (H) 0.61 - 1.24 mg/dL   Calcium  8.3 (L) 8.9 - 10.3 mg/dL   Total Protein 7.7 6.5 - 8.1 g/dL   Albumin 3.3 (L) 3.5 - 5.0 g/dL   AST 26 15 - 41 U/L   ALT 19 0 - 44 U/L   Alkaline Phosphatase 49 38 - 126  U/L   Total Bilirubin 0.8 0.0 - 1.2 mg/dL   GFR, Estimated 56 (L) >60 mL/min    Comment: (NOTE) Calculated using the CKD-EPI Creatinine Equation (2021)    Anion gap 10 5 - 15    Comment: Performed at [facility], [address] [place], [place] [address]  Lactic acid, plasma     Status: None   Collection Time: 02/15/24 12:10 AM  Result Value Ref Range   Lactic Acid, Venous 1.4 0.5 - 1.9 mmol/L    Comment: Performed at [facility], [address] [place], [place] [address]  Urinalysis, w/ Reflex to Culture (Infection Suspected) -Urine, Clean Catch     Status: Abnormal   Collection Time: 02/15/24  1:59 AM  Result Value Ref Range   Specimen Source URINE, RANDOM     Comment: CORRECTED ON 06/19 AT 0216: PREVIOUSLY REPORTED AS URINE, CLEAN CATCH   Color, Urine YELLOW (A) YELLOW   APPearance CLEAR (A) CLEAR   Specific Gravity, Urine 1.016 1.005 - 1.030   pH 6.0 5.0 - 8.0   Glucose, UA 50 (A) NEGATIVE mg/dL   Hgb urine dipstick NEGATIVE NEGATIVE   Bilirubin Urine NEGATIVE NEGATIVE   Ketones, ur NEGATIVE NEGATIVE mg/dL   Protein, ur NEGATIVE NEGATIVE mg/dL   Nitrite NEGATIVE NEGATIVE   Leukocytes,Ua NEGATIVE NEGATIVE   RBC / HPF 0-5 0 - 5 RBC/hpf   WBC, UA 0-5 0 - 5 WBC/hpf    Comment:        Reflex urine culture  not performed if WBC <=10, OR if Squamous epithelial cells >5. If Squamous epithelial cells >5 suggest recollection.    Bacteria, UA RARE (A) NONE SEEN   Squamous Epithelial / HPF 0 0 - 5 /HPF    Comment: Performed at [facility] [address] [place], [place] [address]  CBG monitoring, ED     Status: Abnormal   Collection Time: 02/15/24  5:31 AM  Result Value Ref Range   Glucose-Capillary 164 (H) 70 - 99 mg/dL    Comment: Glucose reference range applies only to samples taken after fasting for at least 8 hours.  Protime-INR     Status: None   Collection Time: 02/15/24  5:35 AM  Result Value Ref Range   Prothrombin Time 14.9 11.4 -  15.2 seconds   INR 1.2 0.8 - 1.2    Comment: (NOTE) INR goal varies based on device and disease states. Performed at [facility], [address] [place], [place] [address]   Basic metabolic panel     Status: Abnormal   Collection Time: 02/15/24  5:35 AM  Result Value Ref Range   Sodium 135 135 - 145 mmol/L   Potassium 4.3 3.5 - 5.1 mmol/L   Chloride 102 98 - 111 mmol/L   CO2 26 22 - 32 mmol/L   Glucose, Bld 180 (H) 70 - 99 mg/dL    Comment: Glucose reference range applies only to samples taken after fasting for at least 8 hours.   BUN 23 (H) 6 - 20 mg/dL   Creatinine, Ser 0.98 (H) 0.61 - 1.24 mg/dL   Calcium  8.2 (L) 8.9 - 10.3 mg/dL   GFR, Estimated >11 >91 mL/min    Comment: (NOTE) Calculated using the CKD-EPI Creatinine Equation (2021)    Anion gap 7 5 - 15    Comment: Performed at [facility], [address] [place], [place] [address]  CBC     Status: Abnormal   Collection Time: 02/15/24  5:35 AM  Result Value Ref Range   WBC 12.4 (H) 4.0 - 10.5 K/uL   RBC 4.58 4.22 - 5.81 MIL/uL   Hemoglobin 13.4 13.0 - 17.0 g/dL   HCT 56.2 13.0 - 86.5 %   MCV 90.6 80.0 - 100.0 fL   MCH 29.3 26.0 - 34.0 pg   MCHC 32.3 30.0 - 36.0 g/dL   RDW 78.4 69.6 - 29.5 %   Platelets 326 150 - 400 K/uL   nRBC 0.0 0.0 - 0.2 %    Comment: Performed at [facility], [address] [place], [place] [address]  CBG monitoring, ED     Status: Abnormal   Collection Time: 02/15/24  8:10 AM  Result Value Ref Range   Glucose-Capillary 135 (H) 70 - 99 mg/dL    Comment: Glucose reference range applies only to samples taken after fasting for at least 8 hours.   DG Abd 1 View Result Date: 02/15/2024 CLINICAL DATA:  Left kidney stone ureteric stone EXAM: ABDOMEN - 1 VIEW COMPARISON:  February 08, 2024 FINDINGS: No change in the previously described 4 mm calcification within the region of the left L3 transverse process IMPRESSION: No change in the previously described 4 mm  calcification within the region of the left L3 transverse process. Electronically Signed   By: Fredrich Jefferson M.D.   On: 02/15/2024 08:20   US  Venous Img Lower Unilateral Right Result Date: 02/15/2024 EXAM: ULTRASOUND DUPLEX OF THE RIGHT LOWER EXTREMITY VEINS TECHNIQUE: Duplex ultrasound using B-mode/gray  scaled imaging and Doppler spectral analysis and color flow was obtained of the deep venous structures of the right lower extremity. COMPARISON: None. CLINICAL HISTORY: RLE swelling. FINDINGS: The visualized veins of the lower extremity are patent and free of echogenic thrombus. The veins demonstrate good compressibility with normal color flow study and spectral analysis. IMPRESSION: 1. No evidence of DVT. Electronically signed by: Zadie Herter MD 02/15/2024 12:57 AM EDT RP Workstation: [id number]   DG Foot Complete Right Result Date: 02/15/2024 EXAM: 3 VIEW(S) XRAY OF THE RIGHT FOOT 02/15/2024 12:06:00 AM COMPARISON: MR right foot dated 07/11/2023. CLINICAL HISTORY: Eval for signs of osteomyelitis. States right foot swelling and redness x 3 weeks. 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 No cortical destruction to suggest acute osteomyelitis. SOFT TISSUES: Overlying mild soft tissue edema at the stump. IMPRESSION: 1. No evidence of acute osteomyelitis. 2. Status post transmetatarsal amputation, as above. Electronically signed by: Zadie Herter MD 02/15/2024 12:23 AM EDT RP Workstation: [id number]    Blood pressure 132/79, pulse 68, temperature 97.9 F (36.6 C), temperature source Temporal, resp. rate 17, height 5' 10 (1.778 m), weight 102.1 kg, SpO2 100%.  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  No substantial evidence for acute osteomyelitis.  Cannot rule  out chronic osteomyelitis based on foot appearance. - Wound debridement performed as described below, patient tolerated procedure well.  No bone exposed at this time. - Applied alginate to the wound followed by 4 x 4 gauze, ABD, Kerlix from forefoot to below knee, Ace wrap with compression from forefoot to below knee.  Recommend daily changes for now. - Believe wound is stable at this time but wound culture was taken today to assess for further bacteria.  Appreciate medicine recommendations for antibiotic therapy.  Would likely continue IV antibiotics for a few days and then oral antibiotics thereafter once culture report is back and the infection appears to be more under control. - Discussed with patient long-term I do not know if his limb is salvageable due to the extent of the wound and previous history of chronic osteomyelitis to the area.  Patient does not want a below-knee amputation but is not willing to stay off of his foot.  Discussed with patient in regards to healing that he will likely not heal this wound if he cannot stay off of it.  It is only a matter of time before the wound becomes infected enough where he will likely need a below-knee amputation. - Recommend patient remain nonweightbearing to the right lower extremity.  PT order placed.  Podiatry to follow more peripherally at this time.  Will recheck over the next couple of days to see for improvement.  100% excisional muscle/tendon/fascia debridment of ulcer: Location: Right foot plantar Pre-debridement measurement: 5 cm x 6 cm x 0.2 cm Post-debridement measurement: 5 cm x 6 cm x 0.3 cm Tissue removed:   Fascia, fibrous tissue, biofilm, hyperkeratotic tissue Ulcer was debrided sharply with combination of tissue nippers and scalpel blade into the fascia.  Pink Bridges, DPM 02/15/2024, 11:24 AM

## 2024-02-15 NOTE — Plan of Care (Signed)
  Problem: Activity: Goal: Risk for activity intolerance will decrease Outcome: Progressing   Problem: Pain Managment: Goal: General experience of comfort will improve and/or be controlled Outcome: Progressing

## 2024-02-15 NOTE — Assessment & Plan Note (Signed)
-   He will have lithotripsy this a.m. as planned by Dr. Ace Holder. - He is kept NPO. - Avoiding blood thinners for now.

## 2024-02-15 NOTE — Progress Notes (Signed)
 ED Pharmacy Antibiotic Sign Off An antibiotic consult was received from an ED provider for Vancomycin  per pharmacy dosing for cellulitis. A chart review was completed to assess appropriateness.   The following one time order(s) were placed:  Vancomycin  2500 mg IV X 1.   Further antibiotic and/or antibiotic pharmacy consults should be ordered by the admitting provider if indicated.   Thank you for allowing pharmacy to be a part of this patient's care.   Alvenia Job [facility]  Clinical Pharmacist 02/15/24 12:09 AM

## 2024-02-15 NOTE — Assessment & Plan Note (Addendum)
-   The patient admitted to a medical bed. - Will continue antibiotic therapy with IV vancomycin  and Rocephin. - Pain management will be provided. - He will have warm compresses. - Podiatry consult will be obtained. - I notified Dr. Larey Plenty about the patient.

## 2024-02-15 NOTE — Progress Notes (Signed)
 [name] is a 58 y.o. Caucasian male with medical history significant for depression, type 2 diabetes mellitus, right foot ulcer, OSA, and kidney stones who had a plan for left lithotripsy this a.m., presented to the emergency room with worsening right lower extremity pain and swelling with associated tenderness and erythema.  He was admitted for right lower extremity cellulitis and has been started on IV antibiotics with vancomycin  and Rocephin.  Podiatry will further evaluate.  He is also planned to have lithotripsy this morning for left-sided nephrolithiasis per urology.  Patient has undergone some debridement of his foot wound and patient has been recommended to remain nonweightbearing to the right lower extremity with PT order placed.  He has undergone lithotripsy as well with no acute events or concerns noted, but still continues to have significant left flank pain.  Continue to monitor with repeat labs in a.m. and reassess for pain control and continue IV antibiotics.  Wound culture ordered and pending.  Podiatry recommending several more days of IV antibiotics.  Total care time: 35 minutes.

## 2024-02-15 NOTE — Progress Notes (Signed)
 Pharmacy Antibiotic Note  [name] is a 58 y.o. male admitted on 02/14/2024 with cellulitis.  Pharmacy has been consulted for Vancomycin  dosing.  Plan: Vancomycin  2500 mg IV X 1 loading dose given in ED on 6/19 @ 0118.  Vancomycin  2 gm IV Q24H ordered to start on 6/20 @ 0100.  AUC = 522.5 Vanc trough = 12.1   Height: 5' 10 (177.8 cm) Weight: 102.1 kg (225 lb) IBW/kg (Calculated) : 73  Temp (24hrs), Avg:98.6 F (37 C), Min:98.3 F (36.8 C), Max:98.8 F (37.1 C)  Recent Labs  Lab 02/14/24 1925 02/15/24 0010  WBC 14.5*  --   CREATININE 1.43*  --   LATICACIDVEN  --  1.4    Estimated Creatinine Clearance: 66.6 mL/min (A) (by C-G formula based on SCr of 1.43 mg/dL (H)).    No Known Allergies  Antimicrobials this admission:   >>    >>   Dose adjustments this admission:   Microbiology results:  BCx:   UCx:    Sputum:    MRSA PCR:   Thank you for allowing pharmacy to be a part of this patient's care.  [name] D 02/15/2024 3:08 AM

## 2024-02-15 NOTE — Assessment & Plan Note (Signed)
 -  Continue antihypertensive therapy ?

## 2024-02-15 NOTE — ED Notes (Signed)
 Admitting Provider at bedside.

## 2024-02-15 NOTE — Assessment & Plan Note (Signed)
 -  We will continue statin therapy.

## 2024-02-15 NOTE — Assessment & Plan Note (Signed)
-   The patient will be placed on supplemental coverage with NovoLog . - Will continue basal coverage.-Will hold off metformin.

## 2024-02-16 ENCOUNTER — Encounter: Payer: Self-pay | Admitting: Urology

## 2024-02-16 LAB — CBC
HCT: 38.5 % — ABNORMAL LOW (ref 39.0–52.0)
Hemoglobin: 12.7 g/dL — ABNORMAL LOW (ref 13.0–17.0)
MCH: 29.5 pg (ref 26.0–34.0)
MCHC: 33 g/dL (ref 30.0–36.0)
MCV: 89.3 fL (ref 80.0–100.0)
Platelets: 296 10*3/uL (ref 150–400)
RBC: 4.31 MIL/uL (ref 4.22–5.81)
RDW: 13.9 % (ref 11.5–15.5)
WBC: 8.2 10*3/uL (ref 4.0–10.5)
nRBC: 0 % (ref 0.0–0.2)

## 2024-02-16 LAB — BASIC METABOLIC PANEL WITH GFR
Anion gap: 10 (ref 5–15)
BUN: 21 mg/dL — ABNORMAL HIGH (ref 6–20)
CO2: 23 mmol/L (ref 22–32)
Calcium: 8.1 mg/dL — ABNORMAL LOW (ref 8.9–10.3)
Chloride: 106 mmol/L (ref 98–111)
Creatinine, Ser: 1.04 mg/dL (ref 0.61–1.24)
GFR, Estimated: >60 mL/min (ref >60)
Glucose, Bld: 187 mg/dL — ABNORMAL HIGH (ref 70–99)
Potassium: 4.1 mmol/L (ref 3.5–5.1)
Sodium: 139 mmol/L (ref 135–145)

## 2024-02-16 LAB — GLUCOSE, CAPILLARY
Glucose-Capillary: 159 mg/dL — ABNORMAL HIGH (ref 70–99)
Glucose-Capillary: 146 mg/dL — ABNORMAL HIGH (ref 70–99)
Glucose-Capillary: 146 mg/dL — ABNORMAL HIGH (ref 70–99)
Glucose-Capillary: 205 mg/dL — ABNORMAL HIGH (ref 70–99)
Glucose-Capillary: 181 mg/dL — ABNORMAL HIGH (ref 70–99)

## 2024-02-16 LAB — MAGNESIUM: Magnesium: 2.1 mg/dL (ref 1.7–2.4)

## 2024-02-16 MED ORDER — TAMSULOSIN HCL 0.4 MG PO CAPS
ORAL_CAPSULE | ORAL | Status: AC
  Filled 2024-02-16: qty 1

## 2024-02-16 MED ORDER — INSULIN ASPART 100 UNIT/ML IJ SOLN
INTRAMUSCULAR | Status: AC
  Filled 2024-02-16: qty 1

## 2024-02-16 MED ORDER — VANCOMYCIN HCL 1250 MG/250ML IV SOLN
1250.0000 mg | Freq: Two times a day (BID) | INTRAVENOUS | Status: DC
  Administered 2024-02-16 – 2024-02-17 (×2): 1250 mg via INTRAVENOUS
  Filled 2024-02-16 (×3): qty 250

## 2024-02-16 MED ORDER — ATORVASTATIN CALCIUM 20 MG PO TABS
ORAL_TABLET | ORAL | Status: AC
Start: 2024-02-16 — End: 2024-02-16
  Filled 2024-02-16: qty 1

## 2024-02-16 MED ORDER — ADULT MULTIVITAMIN W/MINERALS CH
ORAL_TABLET | ORAL | Status: AC
  Filled 2024-02-16: qty 1

## 2024-02-16 NOTE — Progress Notes (Signed)
 PODIATRY / FOOT AND ANKLE SURGERY PROGRESS NOTE  Requesting Physician: Dr. Achilles Holes  Reason for consult: Right foot wound/cellulitis  HPI: [name] is a 58 y.o. male who presents today resting in bedside chair comfortably.  He notes no pain today to the right lower extremity.  He has kept his dressing clean and dry since yesterday.  He has been seen by physical therapy and has worked on weightbearing with heel contact.  PMHx:  Past Medical History:  Diagnosis Date   Acute prostatitis without hematuria 05/20/2022   Allergy    Atypical pneumonia 12/18/2022   Complication of anesthesia    pt states at [facility] he stopped breathing due to his sleep apnea-hard to wake up   COVID-19    Depression    Diabetic ulcer of both feet (HCC)    DM (diabetes mellitus), type 2 (HCC)    History of kidney stones    History of methicillin resistant staphylococcus aureus (MRSA) 09/2020   foot   Neuromuscular disorder (HCC)    neuropathy of back and bilateral feet   Obesity    Sleep apnea    does not use cpap    Surgical Hx:  Past Surgical History:  Procedure Laterality Date   AMPUTATION Left    toe amputation   CYSTOSCOPY/URETEROSCOPY/HOLMIUM LASER/STENT PLACEMENT Right 07/29/2022   Procedure: CYSTOSCOPY/URETEROSCOPY/HOLMIUM LASER/STENT PLACEMENT;  Surgeon: Lawerence Pressman, MD;  Location: [facility];  Service: Urology;  Laterality: Right;   EXTRACORPOREAL SHOCK WAVE LITHOTRIPSY Left 02/08/2024   Procedure: LITHOTRIPSY, ESWL;  Surgeon: Geraline Knapp, MD;  Location: [facility];  Service: Urology;  Laterality: Left;   EXTRACORPOREAL SHOCK WAVE LITHOTRIPSY Left 02/15/2024   Procedure: LITHOTRIPSY, ESWL;  Surgeon: Dustin Gimenez, MD;  Location: [facility];  Service: Urology;  Laterality: Left;   FOOT SURGERY Right 09/2012   cyst removed   LOWER EXTREMITY ANGIOGRAPHY Right 07/12/2023   Procedure: Lower Extremity Angiography;  Surgeon: Celso College, MD;  Location: [facility];  Service:  Cardiovascular;  Laterality: Right;   SPINE SURGERY  2004   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 He has never been exposed to tobacco smoke. He has never used smokeless tobacco. He reports that he does not drink alcohol and does not use drugs.  Allergies: No Known Allergies   Medications Prior to Admission  Medication Sig Dispense Refill   aspirin  EC 81 MG tablet Take 81 mg by mouth daily. Swallow whole.     atorvastatin  (LIPITOR) 20 MG tablet Take 1 tablet (20 mg total) by mouth daily. 90 tablet 3   DULoxetine  (CYMBALTA ) 60 MG capsule TAKE 1 CAPSULE BY MOUTH EVERY DAY 90 capsule 2   insulin  glargine, 2 Unit Dial, (TOUJEO  MAX SOLOSTAR) 300 UNIT/ML Solostar Pen Inject 20 Units into the skin daily. 15 mL 0   metFORMIN  (GLUCOPHAGE -XR) 500 MG 24 hr tablet TAKE 1 TABLET BY MOUTH EVERY DAY WITH BREAKFAST 90 tablet 1   Multiple Vitamins-Minerals (MULTIVITAMIN WITH MINERALS) tablet Take 1 tablet by mouth daily.     naproxen  (NAPROSYN )  500 MG tablet Take 1 tablet (500 mg total) by mouth 2 (two) times daily. (Patient taking differently: Take 500 mg by mouth 2 (two) times daily as needed for mild pain (pain score 1-3).) 20 tablet 0   oxyCODONE  (ROXICODONE ) 5 MG immediate release tablet Take 1.5 tablets (7.5 mg total) by mouth daily. As needed for severe pain 45 tablet 0   [EXPIRED] oxyCODONE -acetaminophen  (PERCOCET/ROXICET) 5-325 MG tablet Take 1-2 tablets by mouth every 6 (six) hours as needed for up to 5 days for severe pain (pain score 7-10). 15 tablet 0   SODIUM FLUORIDE 5000 SENSITIVE 1.1-5 % GEL Brush as directed by your provider.     tamsulosin   (FLOMAX ) 0.4 MG CAPS capsule Take 1 capsule (0.4 mg total) by mouth daily. 30 capsule 0   telmisartan  (MICARDIS ) 40 MG tablet TAKE 1 TABLET BY MOUTH EVERYDAY AT BEDTIME (Patient taking differently: Take 40 mg by mouth daily.) 90 tablet 1   Continuous Glucose Receiver (DEXCOM G7 RECEIVER) DEVI Check blood sugars at least 3 times a day 1 each 0   Continuous Glucose Sensor (DEXCOM G7 SENSOR) MISC USE 1 SENSOR EVERY 10 DAYS FOR 30 DAYS. NO REFILLS. 3 each 5   Insulin  Pen Needle (PEN NEEDLES) 32G X 6 MM MISC Use to give insulin  once daily. 100 each 3   ondansetron  (ZOFRAN -ODT) 4 MG disintegrating tablet Take 1 tablet (4 mg total) by mouth every 8 (eight) hours as needed for nausea or vomiting. (Patient not taking: Reported on 02/15/2024) 20 tablet 0   oxyCODONE  (ROXICODONE ) 5 MG immediate release tablet Take 1.5 tablets (7.5 mg total) by mouth daily. As needed for severe pain 45 tablet 0    Physical Exam: General: Alert and oriented.  No apparent distress. Vascular: DP/PT pulses +1 bilaterally, no hair growth present bilateral lower extremities.  Mild edema and minimal erythema present to the right forefoot and ankle extending up the leg to the lower knee area, appears to be greatly improved overall.  Neuro: Light touch sensation absent to bilateral lower extremities.  Derm: Plantar right foot ulceration measures 5 cm x 6 cm x 0.2 cm, appears to have some tenderness material likely consistent with plantar fascia within a couple of places of the wound.  Overall wound appears to be fairly granular, minimal erythema and mild edema present with a fair amount of serous drainage, no purulence.   MSK: Foot right side.  Right TMA  Results for orders placed or performed during the hospital encounter of 02/14/24 (from the past 48 hours)  CBC with Differential     Status: Abnormal   Collection Time: 02/14/24  7:25 PM  Result Value Ref Range   WBC 14.5 (H) 4.0 - 10.5 K/uL   RBC 4.50 4.22 - 5.81 MIL/uL    Hemoglobin 13.4 13.0 - 17.0 g/dL   HCT 19.1 47.8 - 29.5 %   MCV 89.1 80.0 - 100.0 fL   MCH 29.8 26.0 - 34.0 pg   MCHC 33.4 30.0 - 36.0 g/dL   RDW 62.1 30.8 - 65.7 %   Platelets 369 150 - 400 K/uL   nRBC 0.0 0.0 - 0.2 %   Neutrophils Relative % 70 %   Neutro Abs 10.2 (H) 1.7 - 7.7 K/uL   Lymphocytes Relative 12 %   Lymphs Abs 1.7 0.7 - 4.0 K/uL   Monocytes Relative 12 %   Monocytes Absolute 1.8 (H) 0.1 - 1.0 K/uL   Eosinophils Relative 4 %   Eosinophils Absolute 0.6 (H)  0.0 - 0.5 K/uL   Basophils Relative 1 %   Basophils Absolute 0.2 (H) 0.0 - 0.1 K/uL   Immature Granulocytes 1 %   Abs Immature Granulocytes 0.10 (H) 0.00 - 0.07 K/uL    Comment: Performed at [facility], [address] [place], [place] [address]  Comprehensive metabolic panel     Status: Abnormal   Collection Time: 02/14/24  7:25 PM  Result Value Ref Range   Sodium 135 135 - 145 mmol/L   Potassium 4.0 3.5 - 5.1 mmol/L   Chloride 102 98 - 111 mmol/L   CO2 23 22 - 32 mmol/L   Glucose, Bld 220 (H) 70 - 99 mg/dL    Comment: Glucose reference range applies only to samples taken after fasting for at least 8 hours.   BUN 26 (H) 6 - 20 mg/dL   Creatinine, Ser 1.91 (H) 0.61 - 1.24 mg/dL   Calcium  8.3 (L) 8.9 - 10.3 mg/dL   Total Protein 7.7 6.5 - 8.1 g/dL   Albumin 3.3 (L) 3.5 - 5.0 g/dL   AST 26 15 - 41 U/L   ALT 19 0 - 44 U/L   Alkaline Phosphatase 49 38 - 126 U/L   Total Bilirubin 0.8 0.0 - 1.2 mg/dL   GFR, Estimated 56 (L) >60 mL/min    Comment: (NOTE) Calculated using the CKD-EPI Creatinine Equation (2021)    Anion gap 10 5 - 15    Comment: Performed at [facility], [address] [place], [place] [address]  Blood culture (routine x 2)     Status: None (Preliminary result)   Collection Time: 02/14/24 11:55 PM   Specimen: BLOOD  Result Value Ref Range   Specimen Description BLOOD RIGHT ASSIST CONTROL    Special Requests      BOTTLES DRAWN AEROBIC AND ANAEROBIC Blood Culture adequate  volume   Culture      NO GROWTH 1 DAY Performed at [facility], [address], [place], [place] [address]    Report Status PENDING   Blood culture (routine x 2)     Status: None (Preliminary result)   Collection Time: 02/15/24 12:00 AM   Specimen: BLOOD  Result Value Ref Range   Specimen Description BLOOD LEFT FOREARM    Special Requests      BOTTLES DRAWN AEROBIC AND ANAEROBIC Blood Culture results may not be optimal due to an inadequate volume of blood received in culture bottles   Culture      NO GROWTH 1 DAY Performed at [facility], [address] [place], [place] [address]    Report Status PENDING   Lactic acid, plasma     Status: None   Collection Time: 02/15/24 12:10 AM  Result Value Ref Range   Lactic Acid, Venous 1.4 0.5 - 1.9 mmol/L    Comment: Performed at [facility], [address] [place], [place] [address]  Urinalysis, w/ Reflex to Culture (Infection Suspected) -Urine, Clean Catch     Status: Abnormal   Collection Time: 02/15/24  1:59 AM  Result Value Ref Range   Specimen Source URINE, RANDOM     Comment: CORRECTED ON 06/19 AT 0216: PREVIOUSLY REPORTED AS URINE, CLEAN CATCH   Color, Urine YELLOW (A) YELLOW   APPearance CLEAR (A) CLEAR   Specific Gravity, Urine 1.016 1.005 - 1.030   pH 6.0 5.0 - 8.0   Glucose, UA 50 (A) NEGATIVE mg/dL   Hgb urine dipstick NEGATIVE NEGATIVE   Bilirubin Urine  NEGATIVE NEGATIVE   Ketones, ur NEGATIVE NEGATIVE mg/dL   Protein, ur NEGATIVE NEGATIVE mg/dL   Nitrite NEGATIVE NEGATIVE   Leukocytes,Ua NEGATIVE NEGATIVE   RBC / HPF 0-5 0 - 5 RBC/hpf   WBC, UA 0-5 0 - 5 WBC/hpf    Comment:        Reflex urine culture not performed if WBC <=10, OR if Squamous epithelial cells >5. If Squamous epithelial cells >5 suggest recollection.    Bacteria, UA RARE (A) NONE SEEN   Squamous Epithelial / HPF 0 0 - 5 /HPF    Comment: Performed at [facility], [address] [place], [place]  [address]  CBG monitoring, ED     Status: Abnormal   Collection Time: 02/15/24  5:31 AM  Result Value Ref Range   Glucose-Capillary 164 (H) 70 - 99 mg/dL    Comment: Glucose reference range applies only to samples taken after fasting for at least 8 hours.  Protime-INR     Status: None   Collection Time: 02/15/24  5:35 AM  Result Value Ref Range   Prothrombin Time 14.9 11.4 - 15.2 seconds   INR 1.2 0.8 - 1.2    Comment: (NOTE) INR goal varies based on device and disease states. Performed at [facility], [address] [place], [place] [address]   Cortisol-am, blood     Status: None   Collection Time: 02/15/24  5:35 AM  Result Value Ref Range   Cortisol - AM 14.0 6.7 - 22.6 ug/dL    Comment: Performed at [facility], [address] [place], [place] [address]  Basic metabolic panel     Status: Abnormal   Collection Time: 02/15/24  5:35 AM  Result Value Ref Range   Sodium 135 135 - 145 mmol/L   Potassium 4.3 3.5 - 5.1 mmol/L   Chloride 102 98 - 111 mmol/L   CO2 26 22 - 32 mmol/L   Glucose, Bld 180 (H) 70 - 99 mg/dL    Comment: Glucose reference range applies only to samples taken after fasting for at least 8 hours.   BUN 23 (H) 6 - 20 mg/dL   Creatinine, Ser 9.14 (H) 0.61 - 1.24 mg/dL   Calcium  8.2 (L) 8.9 - 10.3 mg/dL   GFR, Estimated >78 >29 mL/min    Comment: (NOTE) Calculated using the CKD-EPI Creatinine Equation (2021)    Anion gap 7 5 - 15    Comment: Performed at [facility], [address] [place], [place] [address]  CBC     Status: Abnormal   Collection Time: 02/15/24  5:35 AM  Result Value Ref Range   WBC 12.4 (H) 4.0 - 10.5 K/uL   RBC 4.58 4.22 - 5.81 MIL/uL   Hemoglobin 13.4 13.0 - 17.0 g/dL   HCT 08.6 57.8 - 46.9 %   MCV 90.6 80.0 - 100.0 fL   MCH 29.3 26.0 - 34.0 pg   MCHC 32.3 30.0 - 36.0 g/dL   RDW 62.9 52.8 - 41.3 %   Platelets 326 150 - 400 K/uL   nRBC 0.0 0.0 - 0.2 %    Comment: Performed at [facility], [address] [place], [place] [address]  Hemoglobin A1c     Status: Abnormal   Collection Time: 02/15/24  5:35 AM  Result Value Ref Range   Hgb A1c MFr Bld 6.7 (H) 4.8 - 5.6 %    Comment: (NOTE) Diagnosis of Diabetes The following HbA1c ranges recommended  by the American Diabetes Association (ADA) may be used as an aid in the diagnosis of diabetes mellitus.  Hemoglobin             Suggested A1C NGSP%              Diagnosis  <5.7                   Non Diabetic  5.7-6.4                Pre-Diabetic  >6.4                   Diabetic  <7.0                   Glycemic control for                       adults with diabetes.     Mean Plasma Glucose 145.59 mg/dL    Comment: Performed at [facility], [address], [place], [place] [address]  CBG monitoring, ED     Status: Abnormal   Collection Time: 02/15/24  8:10 AM  Result Value Ref Range   Glucose-Capillary 135 (H) 70 - 99 mg/dL    Comment: Glucose reference range applies only to samples taken after fasting for at least 8 hours.  Aerobic/Anaerobic Culture w Gram Stain (surgical/deep wound)     Status: None (Preliminary result)   Collection Time: 02/15/24 10:37 AM   Specimen: Foot; Wound  Result Value Ref Range   Specimen Description      FOOT RIGHT Performed at [facility], [address] [place], [place] [address]    Special Requests      NONE Performed at [facility], [address] [place], [place] [address]    Gram Stain      NO WBC SEEN RARE GRAM POSITIVE COCCI RARE GRAM POSITIVE RODS    Culture      RARE STAPHYLOCOCCUS AUREUS CULTURE REINCUBATED FOR BETTER GROWTH Performed at [facility], [address] [place], [place] [address]    Report Status PENDING   CBG monitoring, ED     Status: Abnormal   Collection Time: 02/15/24 11:49 AM  Result Value Ref Range   Glucose-Capillary 119 (H) 70 - 99 mg/dL    Comment: Glucose reference range applies only to samples taken after  fasting for at least 8 hours.  Glucose, capillary     Status: Abnormal   Collection Time: 02/15/24  4:44 PM  Result Value Ref Range   Glucose-Capillary 189 (H) 70 - 99 mg/dL    Comment: Glucose reference range applies only to samples taken after fasting for at least 8 hours.  Glucose, capillary     Status: Abnormal   Collection Time: 02/15/24  9:37 PM  Result Value Ref Range   Glucose-Capillary 218 (H) 70 - 99 mg/dL    Comment: Glucose reference range applies only to samples taken after fasting for at least 8 hours.   Comment 1 Notify RN    Comment 2 Document in Chart   CBC     Status: Abnormal   Collection Time: 02/16/24  6:07 AM  Result Value Ref Range   WBC 8.2 4.0 - 10.5 K/uL   RBC 4.31 4.22 - 5.81 MIL/uL   Hemoglobin 12.7 (L) 13.0 - 17.0 g/dL   HCT 02.7 (L) 25.3 - 66.4 %   MCV 89.3 80.0 - 100.0  fL   MCH 29.5 26.0 - 34.0 pg   MCHC 33.0 30.0 - 36.0 g/dL   RDW 16.1 09.6 - 04.5 %   Platelets 296 150 - 400 K/uL   nRBC 0.0 0.0 - 0.2 %    Comment: Performed at [facility], [address] [place], [place] [address]  Basic metabolic panel with GFR     Status: Abnormal   Collection Time: 02/16/24  6:07 AM  Result Value Ref Range   Sodium 139 135 - 145 mmol/L   Potassium 4.1 3.5 - 5.1 mmol/L   Chloride 106 98 - 111 mmol/L   CO2 23 22 - 32 mmol/L   Glucose, Bld 187 (H) 70 - 99 mg/dL    Comment: Glucose reference range applies only to samples taken after fasting for at least 8 hours.   BUN 21 (H) 6 - 20 mg/dL   Creatinine, Ser 1.91 0.61 - 1.24 mg/dL   Calcium  8.1 (L) 8.9 - 10.3 mg/dL   GFR, Estimated >47 >82 mL/min    Comment: (NOTE) Calculated using the CKD-EPI Creatinine Equation (2021)    Anion gap 10 5 - 15    Comment: Performed at [facility], [address] [place], [place] [address]  Magnesium     Status: None   Collection Time: 02/16/24  6:07 AM  Result Value Ref Range   Magnesium 2.1 1.7 - 2.4 mg/dL    Comment: Performed at [facility] [place], [address] [place], [place] [address]  Glucose, capillary     Status: Abnormal   Collection Time: 02/16/24  7:39 AM  Result Value Ref Range   Glucose-Capillary 159 (H) 70 - 99 mg/dL    Comment: Glucose reference range applies only to samples taken after fasting for at least 8 hours.  Glucose, capillary     Status: Abnormal   Collection Time: 02/16/24 11:19 AM  Result Value Ref Range   Glucose-Capillary 146 (H) 70 - 99 mg/dL    Comment: Glucose reference range applies only to samples taken after fasting for at least 8 hours.  Glucose, capillary     Status: Abnormal   Collection Time: 02/16/24 11:59 AM  Result Value Ref Range   Glucose-Capillary 146 (H) 70 - 99 mg/dL    Comment: Glucose reference range applies only to samples taken after fasting for at least 8 hours.   DG Abd 1 View Result Date: 02/15/2024 CLINICAL DATA:  Left kidney stone ureteric stone EXAM: ABDOMEN - 1 VIEW COMPARISON:  February 08, 2024 FINDINGS: No change in the previously described 4 mm calcification within the region of the left L3 transverse process IMPRESSION: No change in the previously described 4 mm calcification within the region of the left L3 transverse process. Electronically Signed   By: Fredrich Jefferson M.D.   On: 02/15/2024 08:20   US  Venous Img Lower Unilateral Right Result Date: 02/15/2024 EXAM: ULTRASOUND DUPLEX OF THE RIGHT LOWER EXTREMITY VEINS TECHNIQUE: Duplex ultrasound using B-mode/gray scaled imaging and Doppler spectral analysis and color flow was obtained of the deep venous structures of the right lower extremity. COMPARISON: None. CLINICAL HISTORY: RLE swelling. FINDINGS: The visualized veins of the lower extremity are patent and free of echogenic thrombus. The veins demonstrate good compressibility with normal color flow study and spectral analysis. IMPRESSION: 1. No evidence of DVT. Electronically signed by: Zadie Herter MD 02/15/2024 12:57 AM EDT RP Workstation: [id number]   DG Foot  Complete Right Result Date: 02/15/2024 EXAM: 3 VIEW(S) XRAY  OF THE RIGHT FOOT 02/15/2024 12:06:00 AM COMPARISON: MR right foot dated 07/11/2023. CLINICAL HISTORY: Eval for signs of osteomyelitis. States right foot swelling and redness x 3 weeks. 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 No cortical destruction to suggest acute osteomyelitis. SOFT TISSUES: Overlying mild soft tissue edema at the stump. IMPRESSION: 1. No evidence of acute osteomyelitis. 2. Status post transmetatarsal amputation, as above. Electronically signed by: Zadie Herter MD 02/15/2024 12:23 AM EDT RP Workstation: [id number]    Blood pressure 120/67, pulse 71, temperature 98 F (36.7 C), resp. rate 17, height 5' 10 (1.778 m), weight 102.1 kg, SpO2 100%.  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  No substantial evidence for acute osteomyelitis.  Cannot rule out chronic osteomyelitis based on foot appearance. - Erythema and edema appear to be greatly reduced to the right lower extremity.  Wound appears stable at this time. - Applied alginate to the wound followed by 4 x 4 gauze, ABD, Kerlix from forefoot to below knee, Ace wrap with compression from forefoot to below knee.  Recommend daily changes for now.  Patient also changed at home upon discharge. - Appreciate medicine recommendations for antibiotic therapy.  Wound culture growing gram-positive cocci and gram-positive rods.  Recommend 1 more day of IV antibiotics than likely switching to oral antibiotics based on culture report.  Likely can be discharged on combination of Augmentin  and doxycycline  for 7 days. - Patient should try to stay out the right foot is much as possible and only use the heel contact for  transfers.  Discussed long-term that if patient continues to put pressure on his foot he is likely to get further infection and is at high risk for below-knee amputation.  Podiatry team to sign off at this time.  Patient is to follow-up in outpatient clinic in a few weeks.  Pink Bridges, DPM 02/16/2024, 2:00 PM

## 2024-02-16 NOTE — Progress Notes (Signed)
 PROGRESS NOTE    JAXTIN RAIMONDO  GNF:[id number] DOB: 1966-03-02 DOA: 02/14/2024 PCP: Thersia Flax, MD   Brief Narrative:    VENKAT ANKNEY is a 58 y.o. Caucasian male with medical history significant for depression, type 2 diabetes mellitus, right foot ulcer, OSA, and kidney stones who had a plan for left lithotripsy this a.m., presented to the emergency room with worsening right lower extremity pain and swelling with associated tenderness and erythema.  He was admitted for right lower extremity cellulitis and has been started on IV antibiotics with vancomycin  and Rocephin.  Podiatry will further evaluate.  He is also planned to have lithotripsy this morning for left-sided nephrolithiasis per urology.   Patient has undergone some debridement of his foot wound and patient has been recommended to remain nonweightbearing to the right lower extremity with PT order placed.  He has undergone lithotripsy as well with no acute events or concerns noted, but still continues to have significant left flank pain.  Continue to monitor with repeat labs in a.m. and reassess for pain control and continue IV antibiotics.  Wound culture final report still pending.  Podiatry recommending 1 more day of IV antibiotics and follow-up final culture results with plan to discharge on oral antibiotics for 7 days.  Assessment & Plan:   Principal Problem:   Cellulitis of right lower extremity Active Problems:   Kidney stone on left side   Dyslipidemia   Essential hypertension   Type 2 diabetes mellitus with peripheral neuropathy (HCC)  Assessment and Plan:  Cellulitis of right lower extremity - The patient admitted to a medical bed. - Pain management will be provided. - He will have warm compresses. - Appreciate podiatry evaluation with wound culture pending - Continue IV antibiotics through today and anticipate discharge on oral antibiotics for 7 days per podiatry.  Kidney stone on left side - Status post  lithotripsy, follow-up with urology outpatient -Currently tolerating diet - Avoiding blood thinners for now.  AKI likely related to nephrolithiasis-improving - Continue to monitor creatinine levels to baseline  Type 2 diabetes mellitus with peripheral neuropathy (HCC) with hyperglycemia - The patient will be placed on supplemental coverage with NovoLog . - Will continue basal coverage. - Will hold off metformin . - A1c 6.7%  Essential hypertension - Continue antihypertensive therapy.  Dyslipidemia - We will continue statin therapy.  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.28    DVT prophylaxis: SCDs Code Status: Full Family Communication: None at bedside Disposition Plan:  Status is: Inpatient Remains inpatient appropriate because: Need for IV medications   Consultants:  Podiatry Urology  Procedures:  Lithotripsy 6/19 Excisional muscle/tendon/fascia debridement of ulcer 6/19  Antimicrobials:  Anti-infectives (From admission, onward)    Start     Dose/Rate Route Frequency Ordered Stop   02/16/24 1300  vancomycin  (VANCOREADY) IVPB 1250 mg/250 mL        1,250 mg 166.7 mL/hr over 90 Minutes Intravenous Every 12 hours 02/16/24 1022     02/16/24 0100  vancomycin  (VANCOREADY) IVPB 2000 mg/400 mL  Status:  Discontinued        2,000 mg 200 mL/hr over 120 Minutes Intravenous Every 24 hours 02/15/24 0307 02/16/24 1022   02/16/24 0000  cefTRIAXone (ROCEPHIN) 2 g in sodium chloride  0.9 % 100 mL IVPB        2 g 200 mL/hr over 30 Minutes Intravenous Every 24 hours 02/15/24 0301     02/15/24 0315  vancomycin  (VANCOCIN ) IVPB 1000 mg/200 mL premix  Status:  Discontinued  1,000 mg 200 mL/hr over 60 Minutes Intravenous  Once 02/15/24 0301 02/15/24 0303   02/15/24 0015  vancomycin  (VANCOCIN ) IVPB 1000 mg/200 mL premix       Placed in Followed by Linked Group   1,000 mg 200 mL/hr over 60 Minutes Intravenous  Once 02/15/24 0009 02/15/24 0223   02/15/24 0015  vancomycin  (VANCOREADY) IVPB  1500 mg/300 mL       Placed in Followed by Linked Group   1,500 mg 150 mL/hr over 120 Minutes Intravenous  Once 02/15/24 0009 02/15/24 0524   02/14/24 2345  cefTRIAXone (ROCEPHIN) 2 g in sodium chloride  0.9 % 100 mL IVPB        2 g 200 mL/hr over 30 Minutes Intravenous  Once 02/14/24 2343 02/15/24 0108      Subjective: Patient seen and evaluated today with no new acute complaints or concerns. No acute concerns or events noted overnight.  He has ambulated with PT.  Final culture results are still pending.  Objective: Vitals:   02/16/24 0016 02/16/24 0757 02/16/24 1121 02/16/24 1158  BP: 130/60 125/64 135/76 120/67  Pulse: 90 66 73 71  Resp: 16 16 18 17   Temp: 98 F (36.7 C) 97.9 F (36.6 C) (!) 97 F (36.1 C) 98 F (36.7 C)  TempSrc: Oral Temporal Temporal   SpO2: 96% 97% 99% 100%  Weight:      Height:        Intake/Output Summary (Last 24 hours) at 02/16/2024 1407 Last data filed at 02/16/2024 0852 Gross per 24 hour  Intake 1266.18 ml  Output 400 ml  Net 866.18 ml   Filed Weights   02/14/24 1925 02/15/24 0811  Weight: 102.1 kg 102.1 kg    Examination:  General exam: Appears calm and comfortable  Respiratory system: Clear to auscultation. Respiratory effort normal. Cardiovascular system: S1 & S2 heard, RRR.  Gastrointestinal system: Abdomen is soft Central nervous system: Alert and awake Extremities: Right lower extremity stump with dressings C/D/I Skin: No significant lesions noted Psychiatry: Flat affect.    Data Reviewed: I have personally reviewed following labs and imaging studies  CBC: Recent Labs  Lab 02/14/24 1925 02/15/24 0535 02/16/24 0607  WBC 14.5* 12.4* 8.2  NEUTROABS 10.2*  --   --   HGB 13.4 13.4 12.7*  HCT 40.1 41.5 38.5*  MCV 89.1 90.6 89.3  PLT 369 326 296   Basic Metabolic Panel: Recent Labs  Lab 02/14/24 1925 02/15/24 0535 02/16/24 0607  NA 135 135 139  K 4.0 4.3 4.1  CL 102 102 106  CO2 23 26 23   GLUCOSE 220* 180*  187*  BUN 26* 23* 21*  CREATININE 1.43* 1.34* 1.04  CALCIUM  8.3* 8.2* 8.1*  MG  --   --  2.1   GFR: Estimated Creatinine Clearance: 91.5 mL/min (by C-G formula based on SCr of 1.04 mg/dL). Liver Function Tests: Recent Labs  Lab 02/14/24 1925  AST 26  ALT 19  ALKPHOS 49  BILITOT 0.8  PROT 7.7  ALBUMIN 3.3*   No results for input(s): LIPASE, AMYLASE in the last 168 hours. No results for input(s): AMMONIA in the last 168 hours. Coagulation Profile: Recent Labs  Lab 02/15/24 0535  INR 1.2   Cardiac Enzymes: No results for input(s): CKTOTAL, CKMB, CKMBINDEX, TROPONINI in the last 168 hours. BNP (last 3 results) No results for input(s): PROBNP in the last 8760 hours. HbA1C: Recent Labs    02/15/24 0535  HGBA1C 6.7*   CBG: Recent Labs  Lab  02/15/24 1644 02/15/24 2137 02/16/24 0739 02/16/24 1119 02/16/24 1159  GLUCAP 189* 218* 159* 146* 146*   Lipid Profile: No results for input(s): CHOL, HDL, LDLCALC, TRIG, CHOLHDL, LDLDIRECT in the last 72 hours. Thyroid  Function Tests: No results for input(s): TSH, T4TOTAL, FREET4, T3FREE, THYROIDAB in the last 72 hours. Anemia Panel: No results for input(s): VITAMINB12, FOLATE, FERRITIN, TIBC, IRON, RETICCTPCT in the last 72 hours. Sepsis Labs: Recent Labs  Lab 02/15/24 0010  LATICACIDVEN 1.4    Recent Results (from the past 240 hours)  Blood culture (routine x 2)     Status: None (Preliminary result)   Collection Time: 02/14/24 11:55 PM   Specimen: BLOOD  Result Value Ref Range Status   Specimen Description BLOOD RIGHT ASSIST CONTROL  Final   Special Requests   Final    BOTTLES DRAWN AEROBIC AND ANAEROBIC Blood Culture adequate volume   Culture   Final    NO GROWTH 1 DAY Performed at [facility], [address] [place], [place] [address]    Report Status PENDING  Incomplete  Blood culture (routine x 2)     Status: None (Preliminary result)   Collection  Time: 02/15/24 12:00 AM   Specimen: BLOOD  Result Value Ref Range Status   Specimen Description BLOOD LEFT FOREARM  Final   Special Requests   Final    BOTTLES DRAWN AEROBIC AND ANAEROBIC Blood Culture results may not be optimal due to an inadequate volume of blood received in culture bottles   Culture   Final    NO GROWTH 1 DAY Performed at [facility], [address], [place], [place] [address]    Report Status PENDING  Incomplete  Aerobic/Anaerobic Culture w Gram Stain (surgical/deep wound)     Status: None (Preliminary result)   Collection Time: 02/15/24 10:37 AM   Specimen: Foot; Wound  Result Value Ref Range Status   Specimen Description   Final    FOOT RIGHT Performed at [facility], [address], [place], [place] [address]    Special Requests   Final    NONE Performed at [facility], [address] [place], [place] [address]    Gram Stain   Final    NO WBC SEEN RARE GRAM POSITIVE COCCI RARE GRAM POSITIVE RODS    Culture   Final    RARE STAPHYLOCOCCUS AUREUS CULTURE REINCUBATED FOR BETTER GROWTH Performed at [facility], [address], [place], [place] [address]    Report Status PENDING  Incomplete         Radiology Studies: DG Abd 1 View Result Date: 02/15/2024 CLINICAL DATA:  Left kidney stone ureteric stone EXAM: ABDOMEN - 1 VIEW COMPARISON:  February 08, 2024 FINDINGS: No change in the previously described 4 mm calcification within the region of the left L3 transverse process IMPRESSION: No change in the previously described 4 mm calcification within the region of the left L3 transverse process. Electronically Signed   By: Fredrich Jefferson M.D.   On: 02/15/2024 08:20   US  Venous Img Lower Unilateral Right Result Date: 02/15/2024 EXAM: ULTRASOUND DUPLEX OF THE RIGHT LOWER EXTREMITY VEINS TECHNIQUE: Duplex ultrasound using B-mode/gray scaled imaging and Doppler spectral analysis and color flow was obtained of the deep venous  structures of the right lower extremity. COMPARISON: None. CLINICAL HISTORY: RLE swelling. FINDINGS: The visualized veins of the lower extremity are patent and free of echogenic thrombus. The veins demonstrate good compressibility with normal color flow study and  spectral analysis. IMPRESSION: 1. No evidence of DVT. Electronically signed by: Zadie Herter MD 02/15/2024 12:57 AM EDT RP Workstation: [id number]   DG Foot Complete Right Result Date: 02/15/2024 EXAM: 3 VIEW(S) XRAY OF THE RIGHT FOOT 02/15/2024 12:06:00 AM COMPARISON: MR right foot dated 07/11/2023. CLINICAL HISTORY: Eval for signs of osteomyelitis. States right foot swelling and redness x 3 weeks. 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 No cortical destruction to suggest acute osteomyelitis. SOFT TISSUES: Overlying mild soft tissue edema at the stump. IMPRESSION: 1. No evidence of acute osteomyelitis. 2. Status post transmetatarsal amputation, as above. Electronically signed by: Zadie Herter MD 02/15/2024 12:23 AM EDT RP Workstation: [id number]        Scheduled Meds:  atorvastatin   20 mg Oral Daily   DULoxetine   60 mg Oral Daily   insulin  aspart  0-15 Units Subcutaneous TID WC   insulin  aspart  0-5 Units Subcutaneous QHS   insulin  glargine-yfgn  20 Units Subcutaneous Daily   multivitamin with minerals  1 tablet Oral Daily   tamsulosin   0.4 mg Oral Daily   Continuous Infusions:  cefTRIAXone (ROCEPHIN)  IV Stopped (02/16/24 0010)   vancomycin  1,250 mg (02/16/24 1313)     LOS: 1 day    Time spent: 55 minutes    Itza Maniaci D Mason Sole, DO [facility]  If 7PM-7AM, please contact night-coverage [email or web address] 02/16/2024, 2:07 PM

## 2024-02-16 NOTE — Plan of Care (Signed)
 See chart

## 2024-02-16 NOTE — Evaluation (Signed)
 Physical Therapy Evaluation Patient Details Name: [name] MRN: [id number] DOB: [birth year] Today's Date: 02/16/2024  History of Present Illness  [name] is a 58 y.o. Caucasian male with medical history significant for depression, type 2 diabetes mellitus, right foot ulcer, OSA, and kidney stones who had a plan for left lithotripsy this a.m., presented to the emergency room with worsening right lower extremity pain and swelling with associated tenderness and erythema.  Clinical Impression  Pt is a pleasant 58 year old male who was admitted for R LE cellulitis. Pt performs STS transfers with CGA and AD, pt performs this with no LOB or complaints of pain. Ambulation performed in room with no AD, had to encourage pt to adhere to wearing surgical shoe, pt did not follow NWB on R LE as he stated he could not do that for work. Pt educated on risks of not following precautions and pt verbalized he understood.Pt verbalizes and demonstrates he is at his baseline functional level and does not need any PT services. Please consult if status changes.          If plan is discharge home, recommend the following: A little help with walking and/or transfers;A little help with bathing/dressing/bathroom;Help with stairs or ramp for entrance;Assist for transportation   Can travel by private vehicle        Equipment Recommendations None recommended by PT  Recommendations for Other Services       Functional Status Assessment Patient has had a recent decline in their functional status and demonstrates the ability to make significant improvements in function in a reasonable and predictable amount of time.     Precautions / Restrictions Precautions Precautions: Fall;Other (comment) Recall of Precautions/Restrictions: Intact Precaution/Restrictions Comments: NWB on R LE, surgical shoe Restrictions Weight Bearing Restrictions Per Provider Order: Yes RLE Weight Bearing Per Provider Order: Non weight  bearing      Mobility  Bed Mobility Overal bed mobility: Independent             General bed mobility comments: found sitting up EOB upon entering room    Transfers Overall transfer level: Needs assistance Equipment used: None Transfers: Sit to/from Stand Sit to Stand: Contact guard assist           General transfer comment: x1 STS performed, pt did not want to use RW as he has been getting up to the bathroom without it, encouragement needed to use surgical shoe    Ambulation/Gait Ambulation/Gait assistance: Contact guard assist, Supervision Gait Distance (Feet): 15 Feet Assistive device: None Gait Pattern/deviations: Step-through pattern, Decreased step length - right, Decreased step length - left       General Gait Details: pt refusing to use AD, no LOB experienced, non-complient with NWB precautions on R LE as pt states it is not realistic for him when returning to work  Acupuncturist Bed    Modified Rankin (Stroke Patients Only)       Balance Overall balance assessment: Needs assistance Sitting-balance support: Single extremity supported, Feet supported Sitting balance-Leahy Scale: Fair Sitting balance - Comments: sitting EOB, no LOB   Standing balance support: No upper extremity supported Standing balance-Leahy Scale: Good Standing balance comment: no AD used, no LOB experienced                             Pertinent Vitals/Pain  Pain Assessment Pain Assessment: No/denies pain    Home Living Family/patient expects to be discharged to:: Private residence Living Arrangements: Alone Available Help at Discharge: Family Type of Home: Apartment Home Access: Stairs to enter Entrance Stairs-Rails: Can reach both Entrance Stairs-Number of Steps: 20   Home Layout: One level Home Equipment: Chartered certified accountant - single point Additional Comments: states he uses a SPC sometimes at work, tries to use  scooter for home/weekends    Prior Function Prior Level of Function : Independent/Modified Independent             Mobility Comments: Human resources officer ADLs Comments: ind     Extremity/Trunk Assessment   Upper Extremity Assessment Upper Extremity Assessment: Overall WFL for tasks assessed    Lower Extremity Assessment Lower Extremity Assessment: Overall WFL for tasks assessed    Cervical / Trunk Assessment Cervical / Trunk Assessment: Kyphotic  Communication   Communication Communication: No apparent difficulties    Cognition   Behavior During Therapy: WFL for tasks assessed/performed   PT - Cognitive impairments: No apparent impairments                         Following commands: Intact       Cueing Cueing Techniques: Verbal cues, Tactile cues     General Comments      Exercises     Assessment/Plan    PT Assessment Patient does not need any further PT services  PT Problem List         PT Treatment Interventions      PT Goals (Current goals can be found in the Care Plan section)       Frequency       Co-evaluation               AM-PAC PT 6 Clicks Mobility  Outcome Measure Help needed turning from your back to your side while in a flat bed without using bedrails?: None Help needed moving from lying on your back to sitting on the side of a flat bed without using bedrails?: None Help needed moving to and from a bed to a chair (including a wheelchair)?: A Little Help needed standing up from a chair using your arms (e.g., wheelchair or bedside chair)?: A Little Help needed to walk in hospital room?: A Little Help needed climbing 3-5 steps with a railing? : A Little 6 Click Score: 20    End of Session   Activity Tolerance: Patient tolerated treatment well Patient left: in bed;with call bell/phone within reach Nurse Communication: Mobility status PT Visit Diagnosis: Unsteadiness on feet (R26.81)    Time:  4098-1191 PT Time Calculation (min) (ACUTE ONLY): 20 min   Charges:                   Mccall Lomax Romero-Perozo, SPT  02/16/2024, 10:59 AM

## 2024-02-16 NOTE — Progress Notes (Signed)
 Pharmacy Antibiotic Note  [name] is a 58 y.o. male admitted on 02/14/2024 with cellulitis.  Pharmacy has been consulted for Vancomycin  dosing.  Plan: Vancomycin  2500 mg IV X 1 loading dose given in ED on 6/19 @ 0118.  Vancomycin  1.25 gm IV Q12H ordered to start on 6/20 @ 1300.  AUC = 486.2 Vanc trough = 14.4  Height: 5' 10 (177.8 cm) Weight: 102.1 kg (225 lb) IBW/kg (Calculated) : 73  Temp (24hrs), Avg:98 F (36.7 C), Min:97.9 F (36.6 C), Max:98.1 F (36.7 C)  Recent Labs  Lab 02/14/24 1925 02/15/24 0010 02/15/24 0535 02/16/24 0607  WBC 14.5*  --  12.4* 8.2  CREATININE 1.43*  --  1.34* 1.04  LATICACIDVEN  --  1.4  --   --     Estimated Creatinine Clearance: 91.5 mL/min (by C-G formula based on SCr of 1.04 mg/dL).    No Known Allergies  Antimicrobials this admission: Vanc  6/19 >>  Ceftriaxone 6/19 >>   Dose adjustments this admission:   Microbiology results: 6/18 BCx:  6/19 right foot wound cx: pending  Thank you for allowing pharmacy to be a part of this patient's care.  Gazella Anglin A Mychele Seyller 02/16/2024 10:22 AM

## 2024-02-17 LAB — BASIC METABOLIC PANEL WITH GFR
Anion gap: 7 (ref 5–15)
BUN: 17 mg/dL (ref 6–20)
CO2: 26 mmol/L (ref 22–32)
Calcium: 8.3 mg/dL — ABNORMAL LOW (ref 8.9–10.3)
Chloride: 105 mmol/L (ref 98–111)
Creatinine, Ser: 1 mg/dL (ref 0.61–1.24)
GFR, Estimated: >60 mL/min (ref >60)
Glucose, Bld: 162 mg/dL — ABNORMAL HIGH (ref 70–99)
Potassium: 3.8 mmol/L (ref 3.5–5.1)
Sodium: 138 mmol/L (ref 135–145)

## 2024-02-17 LAB — CBC
HCT: 39.8 % (ref 39.0–52.0)
Hemoglobin: 13 g/dL (ref 13.0–17.0)
MCH: 29.2 pg (ref 26.0–34.0)
MCHC: 32.7 g/dL (ref 30.0–36.0)
MCV: 89.4 fL (ref 80.0–100.0)
Platelets: 323 10*3/uL (ref 150–400)
RBC: 4.45 MIL/uL (ref 4.22–5.81)
RDW: 13.8 % (ref 11.5–15.5)
WBC: 7 10*3/uL (ref 4.0–10.5)
nRBC: 0 % (ref 0.0–0.2)

## 2024-02-17 LAB — MAGNESIUM: Magnesium: 1.9 mg/dL (ref 1.7–2.4)

## 2024-02-17 LAB — GLUCOSE, CAPILLARY: Glucose-Capillary: 153 mg/dL — ABNORMAL HIGH (ref 70–99)

## 2024-02-17 MED ORDER — AMOXICILLIN-POT CLAVULANATE 875-125 MG PO TABS: 1.0000 | ORAL_TABLET | Freq: Two times a day (BID) | ORAL | 0 refills | Status: AC

## 2024-02-17 MED ORDER — DOXYCYCLINE HYCLATE 100 MG PO TABS
100.0000 mg | ORAL_TABLET | Freq: Two times a day (BID) | ORAL | 0 refills | Status: AC
Start: 2024-02-17 — End: 2024-02-24

## 2024-02-17 NOTE — Discharge Summary (Signed)
 Physician Discharge Summary  [name] FMW:[id number] DOB: 1965-10-10 DOA: 02/14/2024  PCP: Marylynn Verneita CROME, MD  Admit date: 02/14/2024  Discharge date: 02/17/2024  Admitted From:Home  Disposition:  Home  Recommendations for Outpatient Follow-up:  Follow up with PCP in 1-2 weeks Follow-up with podiatry Dr. Lennie in 2 weeks which will be scheduled Remain on Augmentin  and doxycycline  for 7 days as prescribed Continue other home medications as prior including pain medications  Home Health: None  Equipment/Devices: None  Discharge Condition:Stable  CODE STATUS: Full  Diet recommendation: Heart Healthy/carb modified  Brief/Interim Summary: [name] is a 58 y.o. Caucasian male with medical history significant for depression, type 2 diabetes mellitus, right foot ulcer, OSA, and kidney stones who had a plan for left lithotripsy this a.m., presented to the emergency room with worsening right lower extremity pain and swelling with associated tenderness and erythema.  He was admitted for right lower extremity cellulitis and has been started on IV antibiotics with vancomycin  and Rocephin .  Podiatry will further evaluate.  He is also planned to have lithotripsy this morning for left-sided nephrolithiasis per urology.   Patient has undergone some debridement of his foot wound and patient has been recommended to remain nonweightbearing to the right lower extremity with PT order placed.  He has undergone lithotripsy as well with no acute events or concerns noted, and states now that his left flank pain is improved.  His final wound cultures have not yet returned, the case was discussed with podiatry who feels that he can discharge with Augmentin  and doxycycline  for 7 more days and then follow-up outpatient.  Will also follow-up with urology outpatient.  No other acute events or concerns noted and he has ambulated with PT with no significant issues.  Discharge Diagnoses:  Principal Problem:    Cellulitis of right lower extremity Active Problems:   Kidney stone on left side   Dyslipidemia   Essential hypertension   Type 2 diabetes mellitus with peripheral neuropathy (HCC)  Principal discharge diagnosis: Right lower extremity cellulitis with wound debridement.  Left-sided nephrolithiasis status post lithotripsy.  Discharge Instructions  Discharge Instructions     Diet - low sodium heart healthy   Complete by: As directed    If the dressing is still on your incision site when you go home, remove it on the third day after your surgery date. Remove dressing if it begins to fall off, or if it is dirty or damaged before the third day.   Complete by: As directed    Increase activity slowly   Complete by: As directed       Allergies as of 02/17/2024   No Known Allergies      Medication List     STOP taking these medications    oxyCODONE -acetaminophen  5-325 MG tablet Commonly known as: PERCOCET/ROXICET       TAKE these medications    amoxicillin -clavulanate 875-125 MG tablet Commonly known as: AUGMENTIN  Take 1 tablet by mouth 2 (two) times daily for 7 days.   aspirin  EC 81 MG tablet Take 81 mg by mouth daily. Swallow whole.   atorvastatin  20 MG tablet Commonly known as: LIPITOR Take 1 tablet (20 mg total) by mouth daily.   Dexcom G7 Receiver Devi Check blood sugars at least 3 times a day   Dexcom G7 Sensor Misc USE 1 SENSOR EVERY 10 DAYS FOR 30 DAYS. NO REFILLS.   doxycycline  100 MG tablet Commonly known as: VIBRA -TABS Take 1 tablet (100 mg total)  by mouth 2 (two) times daily for 7 days.   DULoxetine  60 MG capsule Commonly known as: CYMBALTA  TAKE 1 CAPSULE BY MOUTH EVERY DAY   metFORMIN  500 MG 24 hr tablet Commonly known as: GLUCOPHAGE -XR TAKE 1 TABLET BY MOUTH EVERY DAY WITH BREAKFAST   multivitamin with minerals tablet Take 1 tablet by mouth daily.   naproxen  500 MG tablet Commonly known as: NAPROSYN  Take 1 tablet (500 mg total) by mouth 2  (two) times daily. What changed:  when to take this reasons to take this   ondansetron  4 MG disintegrating tablet Commonly known as: ZOFRAN -ODT Take 1 tablet (4 mg total) by mouth every 8 (eight) hours as needed for nausea or vomiting.   oxyCODONE  5 MG immediate release tablet Commonly known as: Roxicodone  Take 1.5 tablets (7.5 mg total) by mouth daily. As needed for severe pain   oxyCODONE  5 MG immediate release tablet Commonly known as: Roxicodone  Take 1.5 tablets (7.5 mg total) by mouth daily. As needed for severe pain   Pen Needles 32G X 6 MM Misc Use to give insulin  once daily.   Sodium Fluoride 5000 Sensitive 1.1-5 % Gel Generic drug: Sod Fluoride-Potassium Nitrate Brush as directed by your provider.   tamsulosin  0.4 MG Caps capsule Commonly known as: FLOMAX  Take 1 capsule (0.4 mg total) by mouth daily.   telmisartan  40 MG tablet Commonly known as: MICARDIS  TAKE 1 TABLET BY MOUTH EVERYDAY AT BEDTIME What changed:  how much to take how to take this when to take this additional instructions   Toujeo  Max SoloStar 300 UNIT/ML Solostar Pen Generic drug: insulin  glargine (2 Unit Dial) Inject 20 Units into the skin daily.               Discharge Care Instructions  (From admission, onward)           Start     Ordered   02/17/24 0000  If the dressing is still on your incision site when you go home, remove it on the third day after your surgery date. Remove dressing if it begins to fall off, or if it is dirty or damaged before the third day.        02/17/24 0947            Follow-up Information     Marylynn Verneita CROME, MD. Schedule an appointment as soon as possible for a visit in 1 week(s).   Specialty: Internal Medicine Contact information: [address] [place] [address] [phone]         Lennie Barter, DPM Follow up in 2 week(s).   Specialty: Podiatry Contact information: [address] [place]  [address] [phone]         Twylla Glendia BROCKS, MD. Go to.   Specialty: Urology Contact information: [address] [place] [address] [phone]                No Known Allergies  Consultations: Podiatry Urology   Procedures/Studies: DG Abd 1 View Result Date: 02/15/2024 CLINICAL DATA:  Left kidney stone ureteric stone EXAM: ABDOMEN - 1 VIEW COMPARISON:  February 08, 2024 FINDINGS: No change in the previously described 4 mm calcification within the region of the left L3 transverse process IMPRESSION: No change in the previously described 4 mm calcification within the region of the left L3 transverse process. Electronically Signed   By: Franky Chard M.D.   On: 02/15/2024 08:20   US  Venous Img Lower Unilateral  Right Result Date: 02/15/2024 EXAM: ULTRASOUND DUPLEX OF THE RIGHT LOWER EXTREMITY VEINS TECHNIQUE: Duplex ultrasound using B-mode/gray scaled imaging and Doppler spectral analysis and color flow was obtained of the deep venous structures of the right lower extremity. COMPARISON: None. CLINICAL HISTORY: RLE swelling. FINDINGS: The visualized veins of the lower extremity are patent and free of echogenic thrombus. The veins demonstrate good compressibility with normal color flow study and spectral analysis. IMPRESSION: 1. No evidence of DVT. Electronically signed by: Pinkie Pebbles MD 02/15/2024 12:57 AM EDT RP Workstation: [id number]   DG Foot Complete Right Result Date: 02/15/2024 EXAM: 3 VIEW(S) XRAY OF THE RIGHT FOOT 02/15/2024 12:06:00 AM COMPARISON: MR right foot dated 07/11/2023. CLINICAL HISTORY: Eval for signs of osteomyelitis. States right foot swelling and redness x 3 weeks. 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 No cortical destruction to suggest acute osteomyelitis. SOFT TISSUES: Overlying mild soft tissue edema at the stump. IMPRESSION: 1. No evidence of acute osteomyelitis. 2.  Status post transmetatarsal amputation, as above. Electronically signed by: Pinkie Pebbles MD 02/15/2024 12:23 AM EDT RP Workstation: [id number]   DG Abd 1 View Result Date: 02/08/2024 CLINICAL DATA:  Left ureteral stone EXAM: ABDOMEN - 1 VIEW COMPARISON:  February 01, 2024 FINDINGS: No change in the 4 mm calcification projecting to the left of the L3 transverse process that correlate with UPJ stone. Unchanged since prior. Abundant residual fecal material throughout the entire colon. IMPRESSION: Left nephrolithiasis Electronically Signed   By: Franky Chard M.D.   On: 02/08/2024 08:28   Abdomen 1 view (KUB) Result Date: 02/04/2024 CLINICAL DATA:  kidney stones EXAM: ABDOMEN - 1 VIEW COMPARISON:  07/26/2022. FINDINGS: Redemonstration of a well-circumscribed oval 5 x 9 mm calcification overlying the left upper abdomen, between the lateral aspect of the L2 and L3 transverse processes. The calcification orientation appears different than the prior exam and calculus is more midline than prior exam, favoring probable within the renal pelvis region. Differential diagnosis also includes soft tissue calcifications. Correlate clinically to determine the need for additional imaging with CT scan. No other abnormal calcification noted overlying bilateral kidneys, ureters and urinary bladder region. The bowel gas pattern is non-obstructive. No evidence of pneumoperitoneum, within the limitations of a supine film. No acute osseous abnormalities. The soft tissues are within normal limits. Surgical changes, devices, tubes and lines: None. Other findings: None. IMPRESSION: Redemonstration of a well-circumscribed oval 5 x 9 mm calcification overlying the left upper abdomen, between the lateral aspect of the L2 and L3 transverse processes. The calcification orientation appears different than the prior exam and calculus is more midline than prior exam, favoring probable within the renal pelvis region. Differential diagnosis also  includes soft tissue calcifications. Electronically Signed   By: Ree Molt M.D.   On: 02/04/2024 14:07     Discharge Exam: Vitals:   02/17/24 0447 02/17/24 0753  BP: 127/70 127/77  Pulse: 67 80  Resp: 18 18  Temp: 98.2 F (36.8 C) 97.7 F (36.5 C)  SpO2: 96% 97%   Vitals:   02/16/24 1554 02/16/24 2020 02/17/24 0447 02/17/24 0753  BP: 123/65 122/60 127/70 127/77  Pulse: 82 75 67 80  Resp: 15  18 18   Temp: (!) 97.5 F (36.4 C) 97.6 F (36.4 C) 98.2 F (36.8 C) 97.7 F (36.5 C)  TempSrc:      SpO2: 96% 97% 96% 97%  Weight:      Height:  General: Pt is alert, awake, not in acute distress Cardiovascular: RRR, S1/S2 +, no rubs, no gallops Respiratory: CTA bilaterally, no wheezing, no rhonchi Abdominal: Soft, NT, ND, bowel sounds + Extremities: no edema, no cyanosis, right foot dressings C/D/I    The results of significant diagnostics from this hospitalization (including imaging, microbiology, ancillary and laboratory) are listed below for reference.     Microbiology: Recent Results (from the past 240 hours)  Blood culture (routine x 2)     Status: None (Preliminary result)   Collection Time: 02/14/24 11:55 PM   Specimen: BLOOD  Result Value Ref Range Status   Specimen Description BLOOD RIGHT ASSIST CONTROL  Final   Special Requests   Final    BOTTLES DRAWN AEROBIC AND ANAEROBIC Blood Culture adequate volume   Culture   Final    NO GROWTH 2 DAYS Performed at [facility], [address] [place], [place] [address]    Report Status PENDING  Incomplete  Blood culture (routine x 2)     Status: None (Preliminary result)   Collection Time: 02/15/24 12:00 AM   Specimen: BLOOD  Result Value Ref Range Status   Specimen Description BLOOD LEFT FOREARM  Final   Special Requests   Final    BOTTLES DRAWN AEROBIC AND ANAEROBIC Blood Culture results may not be optimal due to an inadequate volume of blood received in culture bottles   Culture   Final     NO GROWTH 2 DAYS Performed at [facility], [address] [place], [place] [address]    Report Status PENDING  Incomplete  Aerobic/Anaerobic Culture w Gram Stain (surgical/deep wound)     Status: None (Preliminary result)   Collection Time: 02/15/24 10:37 AM   Specimen: Foot; Wound  Result Value Ref Range Status   Specimen Description   Final    FOOT RIGHT Performed at [facility], [address] [place], [place] [address]    Special Requests   Final    NONE Performed at [facility], [address] [place], [place] [address]    Gram Stain   Final    NO WBC SEEN RARE GRAM POSITIVE COCCI RARE GRAM POSITIVE RODS Performed at [facility], [address] [place], [place] [address]    Culture   Final    RARE STAPHYLOCOCCUS AUREUS CULTURE REINCUBATED FOR BETTER GROWTH NO ANAEROBES ISOLATED; CULTURE IN PROGRESS FOR 5 DAYS    Report Status PENDING  Incomplete     Labs: BNP (last 3 results) No results for input(s): BNP in the last 8760 hours. Basic Metabolic Panel: Recent Labs  Lab 02/14/24 1925 02/15/24 0535 02/16/24 0607 02/17/24 0619  NA 135 135 139 138  K 4.0 4.3 4.1 3.8  CL 102 102 106 105  CO2 23 26 23 26   GLUCOSE 220* 180* 187* 162*  BUN 26* 23* 21* 17  CREATININE 1.43* 1.34* 1.04 1.00  CALCIUM  8.3* 8.2* 8.1* 8.3*  MG  --   --  2.1 1.9   Liver Function Tests: Recent Labs  Lab 02/14/24 1925  AST 26  ALT 19  ALKPHOS 49  BILITOT 0.8  PROT 7.7  ALBUMIN 3.3*   No results for input(s): LIPASE, AMYLASE in the last 168 hours. No results for input(s): AMMONIA in the last 168 hours. CBC: Recent Labs  Lab 02/14/24 1925 02/15/24 0535 02/16/24 0607 02/17/24 0619  WBC 14.5* 12.4* 8.2 7.0  NEUTROABS 10.2*  --   --   --  HGB 13.4 13.4 12.7* 13.0  HCT 40.1 41.5 38.5* 39.8  MCV 89.1 90.6 89.3 89.4  PLT 369 326 296 323   Cardiac Enzymes: No results for input(s): CKTOTAL, CKMB, CKMBINDEX, TROPONINI in  the last 168 hours. BNP: Invalid input(s): POCBNP CBG: Recent Labs  Lab 02/16/24 1119 02/16/24 1159 02/16/24 1554 02/16/24 2022 02/17/24 0727  GLUCAP 146* 146* 205* 181* 153*   D-Dimer No results for input(s): DDIMER in the last 72 hours. Hgb A1c Recent Labs    02/15/24 0535  HGBA1C 6.7*   Lipid Profile No results for input(s): CHOL, HDL, LDLCALC, TRIG, CHOLHDL, LDLDIRECT in the last 72 hours. Thyroid  function studies No results for input(s): TSH, T4TOTAL, T3FREE, THYROIDAB in the last 72 hours.  Invalid input(s): FREET3 Anemia work up No results for input(s): VITAMINB12, FOLATE, FERRITIN, TIBC, IRON, RETICCTPCT in the last 72 hours. Urinalysis    Component Value Date/Time   COLORURINE YELLOW (A) 02/15/2024 0159   APPEARANCEUR CLEAR (A) 02/15/2024 0159   APPEARANCEUR Clear 02/01/2024 1417   LABSPEC 1.016 02/15/2024 0159   PHURINE 6.0 02/15/2024 0159   GLUCOSEU 50 (A) 02/15/2024 0159   GLUCOSEU NEGATIVE 09/21/2022 0736   HGBUR NEGATIVE 02/15/2024 0159   BILIRUBINUR NEGATIVE 02/15/2024 0159   BILIRUBINUR Negative 02/01/2024 1417   KETONESUR NEGATIVE 02/15/2024 0159   PROTEINUR NEGATIVE 02/15/2024 0159   UROBILINOGEN 0.2 09/21/2022 0736   NITRITE NEGATIVE 02/15/2024 0159   LEUKOCYTESUR NEGATIVE 02/15/2024 0159   Sepsis Labs Recent Labs  Lab 02/14/24 1925 02/15/24 0535 02/16/24 0607 02/17/24 0619  WBC 14.5* 12.4* 8.2 7.0   Microbiology Recent Results (from the past 240 hours)  Blood culture (routine x 2)     Status: None (Preliminary result)   Collection Time: 02/14/24 11:55 PM   Specimen: BLOOD  Result Value Ref Range Status   Specimen Description BLOOD RIGHT ASSIST CONTROL  Final   Special Requests   Final    BOTTLES DRAWN AEROBIC AND ANAEROBIC Blood Culture adequate volume   Culture   Final    NO GROWTH 2 DAYS Performed at [facility], [address], [place], [place] [address]    Report Status  PENDING  Incomplete  Blood culture (routine x 2)     Status: None (Preliminary result)   Collection Time: 02/15/24 12:00 AM   Specimen: BLOOD  Result Value Ref Range Status   Specimen Description BLOOD LEFT FOREARM  Final   Special Requests   Final    BOTTLES DRAWN AEROBIC AND ANAEROBIC Blood Culture results may not be optimal due to an inadequate volume of blood received in culture bottles   Culture   Final    NO GROWTH 2 DAYS Performed at [facility], [address] [place], [place] [address]    Report Status PENDING  Incomplete  Aerobic/Anaerobic Culture w Gram Stain (surgical/deep wound)     Status: None (Preliminary result)   Collection Time: 02/15/24 10:37 AM   Specimen: Foot; Wound  Result Value Ref Range Status   Specimen Description   Final    FOOT RIGHT Performed at [facility], [address] [place], [place] [address]    Special Requests   Final    NONE Performed at [facility], [address] [place], [place] [address]    Gram Stain   Final    NO WBC SEEN RARE GRAM POSITIVE COCCI RARE GRAM POSITIVE RODS Performed at [facility], [address], [place], [place] [address]  Culture   Final    RARE STAPHYLOCOCCUS AUREUS CULTURE REINCUBATED FOR BETTER GROWTH NO ANAEROBES ISOLATED; CULTURE IN PROGRESS FOR 5 DAYS    Report Status PENDING  Incomplete     Time coordinating discharge: 35 minutes  SIGNED:   Adron JONETTA Fairly, DO [facility] 02/17/2024, 9:48 AM  If 7PM-7AM, please contact night-coverage [email or web address]

## 2024-02-17 NOTE — Progress Notes (Signed)
 Patient given discharge instructions. Patient verbalized understanding of all instructions, including follow up with providers and medication.

## 2024-02-18 ENCOUNTER — Encounter: Payer: Self-pay | Admitting: Internal Medicine

## 2024-02-19 ENCOUNTER — Telehealth: Payer: Self-pay | Admitting: *Deleted

## 2024-02-19 NOTE — Transitions of Care (Post Inpatient/ED Visit) (Signed)
   02/19/2024  Name: TEMITOPE GRIFFING MRN: [id number] DOB: Jul 04, 1966  Today's TOC FU Call Status: Today's TOC FU Call Status:: Unsuccessful Call (1st Attempt) Unsuccessful Call (1st Attempt) Date: 02/19/24  Attempted to reach the patient regarding the most recent Inpatient/ED visit.  Follow Up Plan: Additional outreach attempts will be made to reach the patient to complete the Transitions of Care (Post Inpatient/ED visit) call.   Cathlean Headland BSN RN [facility] Health Care Management Coordinator [email or web address] Direct Dial: [phone]  Fax: [phone] Website: [email or web address]

## 2024-02-20 ENCOUNTER — Telehealth: Payer: Self-pay | Admitting: *Deleted

## 2024-02-20 LAB — CULTURE, BLOOD (ROUTINE X 2)
Culture: NO GROWTH
Culture: NO GROWTH
Special Requests: ADEQUATE

## 2024-02-20 LAB — AEROBIC/ANAEROBIC CULTURE W GRAM STAIN (SURGICAL/DEEP WOUND): Gram Stain: NONE SEEN

## 2024-02-20 NOTE — Transitions of Care (Post Inpatient/ED Visit) (Signed)
   02/20/2024  Name: [name] MRN: [id number] DOB: 02-27-1966  Today's TOC FU Call Status: Today's TOC FU Call Status:: Unsuccessful Call (2nd Attempt) Unsuccessful Call (2nd Attempt) Date: 02/20/24  Attempted to reach the patient regarding the most recent Inpatient/ED visit.  Follow Up Plan: Additional outreach attempts will be made to reach the patient to complete the Transitions of Care (Post Inpatient/ED visit) call.   Cathlean Headland BSN RN [facility] Health Care Management Coordinator [email or web address] Direct Dial: [phone]  Fax: [phone] Website: [email or web address]

## 2024-02-21 ENCOUNTER — Other Ambulatory Visit

## 2024-02-21 ENCOUNTER — Telehealth: Payer: Self-pay | Admitting: *Deleted

## 2024-02-21 NOTE — Transitions of Care (Post Inpatient/ED Visit) (Signed)
   02/21/2024  Name: [name] MRN: [id number] DOB: 05/16/1966  Today's TOC FU Call Status: Today's TOC FU Call Status:: Unsuccessful Call (3rd Attempt) Unsuccessful Call (3rd Attempt) Date: 02/21/24  Attempted to reach the patient regarding the most recent Inpatient/ED visit.  Follow Up Plan: No further outreach attempts will be made at this time. We have been unable to contact the patient.  Mliss Creed [facility], BSN RN Care Manager/ Transition of Care [place]/ [facility] [phone]

## 2024-02-23 ENCOUNTER — Other Ambulatory Visit: Payer: Self-pay | Admitting: Physician Assistant

## 2024-02-23 ENCOUNTER — Other Ambulatory Visit

## 2024-02-23 DIAGNOSIS — N201 Calculus of ureter: Secondary | ICD-10-CM

## 2024-02-23 DIAGNOSIS — M86671 Other chronic osteomyelitis, right ankle and foot: Secondary | ICD-10-CM

## 2024-02-29 ENCOUNTER — Telehealth (INDEPENDENT_AMBULATORY_CARE_PROVIDER_SITE_OTHER): Admitting: Internal Medicine

## 2024-02-29 ENCOUNTER — Ambulatory Visit: Admitting: Physician Assistant

## 2024-02-29 ENCOUNTER — Other Ambulatory Visit: Payer: Self-pay | Admitting: Internal Medicine

## 2024-02-29 ENCOUNTER — Encounter: Payer: Self-pay | Admitting: Internal Medicine

## 2024-02-29 DIAGNOSIS — N200 Calculus of kidney: Secondary | ICD-10-CM | POA: Diagnosis not present

## 2024-02-29 DIAGNOSIS — E08621 Diabetes mellitus due to underlying condition with foot ulcer: Secondary | ICD-10-CM

## 2024-02-29 DIAGNOSIS — E1142 Type 2 diabetes mellitus with diabetic polyneuropathy: Secondary | ICD-10-CM | POA: Diagnosis not present

## 2024-02-29 DIAGNOSIS — Z7984 Long term (current) use of oral hypoglycemic drugs: Secondary | ICD-10-CM | POA: Diagnosis not present

## 2024-02-29 DIAGNOSIS — M86671 Other chronic osteomyelitis, right ankle and foot: Secondary | ICD-10-CM | POA: Diagnosis not present

## 2024-02-29 DIAGNOSIS — E1169 Type 2 diabetes mellitus with other specified complication: Secondary | ICD-10-CM

## 2024-02-29 DIAGNOSIS — E1165 Type 2 diabetes mellitus with hyperglycemia: Secondary | ICD-10-CM

## 2024-02-29 MED ORDER — TOUJEO MAX SOLOSTAR 300 UNIT/ML ~~LOC~~ SOPN: 20.0000 [IU] | PEN_INJECTOR | Freq: Every day | SUBCUTANEOUS | 0 refills | Status: DC

## 2024-02-29 MED ORDER — OXYCODONE HCL 5 MG PO TABS: 7.5000 mg | ORAL_TABLET | Freq: Every day | ORAL | 0 refills | Status: DC

## 2024-02-29 NOTE — Progress Notes (Signed)
 FAILED Virtual Visit , coverting to telephone    Note   This format is felt to be most appropriate for this patient at this time.  All issues noted in this document were discussed and addressed.  No physical exam was performed (except for noted visual exam findings with Video Visits).   I attempted to connect  with [name]  on 02/29/24 at 11:30 AM EDT by a video enabled telemedicine application  and verified that I am speaking with the correct person using two identifiers. Location patient: home Location provider: work or home office Persons participating in the virtual visit: patient, provider  I discussed the limitations, risks, security and privacy concerns of performing an evaluation and management service by telephone and the availability of in person appointments. I also discussed with the patient that there may be a patient responsible charge related to this service. The patient expressed understanding and agreed to proceed.  Interactive audio and video telecommunications were attempted between this provider and patient, however failed, due to patient having technical difficulties   We continued and completed visit with audio only.   Reason for visit: follow up on type 2 DM,  with neuropathy, recent hospitalization   HPI:  1)  hospitalization from June 18 to 21 with RLE cellulitis.  Treated with IV abx fr 3 days and sent home with augmentin /doxycycline  x 7 days.   2) Type 2 DM: he is  no longer wearing the [facility] which ran out one week ago .  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 he received 3 prescriptions from another provider for #15 tablets of oxycodone  in addition to the monthly rx's from me for #45 tablets during a 30 day period from June 5 to jun 13  . Last refill was Jun 13 for #15  for kidney stones   4) nephrolithiasis:  s/p lithotripsy of left ureteral stone on June 21 without complications.   ROS: See pertinent positives and negatives per HPI.  Past Medical History:  Diagnosis Date   Acute prostatitis without hematuria 05/20/2022   Allergy    Anxiety    Atypical pneumonia 12/18/2022   Complication of anesthesia    pt states at [facility] he stopped breathing due to his sleep apnea-hard to wake up   COVID-19    Depression    Diabetic ulcer of both feet (HCC)    DM (diabetes mellitus), type 2 (HCC)    History of kidney stones    History of methicillin resistant staphylococcus aureus (MRSA) 09/2020   foot   Neuromuscular disorder (HCC)    neuropathy of back and bilateral feet   Obesity    Sleep apnea    does not use cpap    Past Surgical History:  Procedure Laterality Date   AMPUTATION Left    toe amputation   CYSTOSCOPY/URETEROSCOPY/HOLMIUM LASER/STENT PLACEMENT Right 07/29/2022   Procedure: CYSTOSCOPY/URETEROSCOPY/HOLMIUM LASER/STENT PLACEMENT;  Surgeon: Francisca Redell BROCKS, MD;  Location: [facility] OR[facility];  Service: Urology;  Laterality: Right;   EXTRACORPOREAL SHOCK WAVE LITHOTRIPSY Left 02/08/2024   Procedure: LITHOTRIPSY, ESWL;  Surgeon: Twylla Glendia BROCKS, MD;  Location: [facility] OR[facility];  Service: Urology;  Laterality: Left;   EXTRACORPOREAL SHOCK WAVE LITHOTRIPSY Left  02/15/2024   Procedure: LITHOTRIPSY, ESWL;  Surgeon: Penne Knee, MD;  Location: [facility];  Service: Urology;  Laterality: Left;   FOOT SURGERY Right 09/2012   cyst removed   LOWER EXTREMITY ANGIOGRAPHY Right 07/12/2023   Procedure: Lower Extremity Angiography;  Surgeon: Marea Selinda RAMAN, MD;  Location: [facility];  Service: Cardiovascular;  Laterality: Right;   SPINE SURGERY  2004   nerve damage in spine   TOE AMPUTATION Right 06/30/2016   5 toes    Family History  Problem Relation Age of Onset   Heart disease Father    Stroke Father    Heart attack  Father 34   Diabetes Father    Heart disease Maternal Grandmother    Stroke Maternal Grandmother    Heart disease Paternal Grandfather    Stroke Paternal Grandfather     SOCIAL HX:  reports that he has never smoked. He has never been exposed to tobacco smoke. He has never used smokeless tobacco. He reports that he does not drink alcohol and does not use drugs.    Current Outpatient Medications:    aspirin  EC 81 MG tablet, Take 81 mg by mouth daily. Swallow whole., Disp: , Rfl:    atorvastatin  (LIPITOR) 20 MG tablet, Take 1 tablet (20 mg total) by mouth daily., Disp: 90 tablet, Rfl: 3   DULoxetine  (CYMBALTA ) 60 MG capsule, TAKE 1 CAPSULE BY MOUTH EVERY DAY, Disp: 90 capsule, Rfl: 2   Insulin  Pen Needle (PEN NEEDLES) 32G X 6 MM MISC, Use to give insulin  once daily., Disp: 100 each, Rfl: 3   metFORMIN  (GLUCOPHAGE -XR) 500 MG 24 hr tablet, TAKE 1 TABLET BY MOUTH EVERY DAY WITH BREAKFAST, Disp: 90 tablet, Rfl: 1   Multiple Vitamins-Minerals (MULTIVITAMIN WITH MINERALS) tablet, Take 1 tablet by mouth daily., Disp: , Rfl:    naproxen  (NAPROSYN ) 500 MG tablet, Take 1 tablet (500 mg total) by mouth 2 (two) times daily., Disp: 20 tablet, Rfl: 0   oxyCODONE  (ROXICODONE ) 5 MG immediate release tablet, Take 1.5 tablets (7.5 mg total) by mouth daily. As needed for severe pain, Disp: 45 tablet, Rfl: 0   SODIUM FLUORIDE 5000 SENSITIVE 1.1-5 % GEL, Brush as directed by your provider., Disp: , Rfl:    tamsulosin  (FLOMAX ) 0.4 MG CAPS capsule, Take 1 capsule (0.4 mg total) by mouth daily., Disp: 30 capsule, Rfl: 0   telmisartan  (MICARDIS ) 40 MG tablet, TAKE 1 TABLET BY MOUTH EVERYDAY AT BEDTIME, Disp: 90 tablet, Rfl: 1   insulin  glargine, 2 Unit Dial, (TOUJEO  MAX SOLOSTAR) 300 UNIT/ML Solostar Pen, Inject 20 Units into the skin daily., Disp: 15 mL, Rfl: 0   oxyCODONE  (ROXICODONE ) 5 MG immediate release tablet, Take 1.5 tablets (7.5 mg total) by mouth daily. As needed for severe pain, Disp: 45 tablet, Rfl:  0  EXAM:   General impression: alert, cooperative and articulate.  No signs of being in distress  Lungs: speech is fluent sentence length suggests that patient is not short of breath and not punctuated by cough, sneezing or sniffing. SABRA   Psych: affect normal.  speech is articulate and non pressured .  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  He is at risk for the consequences of osteomyelitis which include septic emboli and gangrene.  He continues to ignore advice to offload pressure on his right foot. I have encouraged him to offload  whenever possible and follow up with podiatry    Kidney stone on left side Assessment & Plan: S/p lithotripsy on June 21 225   Type 2 diabetes mellitus with peripheral neuropathy (HCC) Assessment & Plan: Using toujeo  20 units daily and metformin ,  A1c is  < 7.0  no changes today but will consider adding jardiance or januvia pending review of post prandials .  Continue ARB and statin   Lab Results  Component Value Date   HGBA1C 6.7 (H) 02/15/2024   Lab Results  Component Value Date   LABMICR See below: 02/01/2024   LABMICR See below: 08/03/2022   MICROALBUR 15.4 (H) 11/03/2023     Lab Results  Component Value Date   CHOL 156 11/03/2023   HDL 34.70 (L) 11/03/2023   LDLCALC 78 11/03/2023   LDLDIRECT 90.0 11/03/2023   TRIG 216.0 (H) 11/03/2023   CHOLHDL 4 11/03/2023      Other orders -     Toujeo  Max SoloStar; Inject 20 Units into the skin daily.  Dispense: 15 mL; Refill: 0 -     oxyCODONE  HCl; Take 1.5 tablets (7.5 mg total) by mouth daily. As needed for severe pain  Dispense: 45 tablet; Refill: 0      I discussed the assessment and treatment plan with the patient. The patient was provided an opportunity to ask questions and all were answered. The patient agreed with the plan  and demonstrated an understanding of the instructions.   The patient was advised to call back or seek an in-person evaluation if the symptoms worsen or if the condition fails to improve as anticipated.   I spent 30 minutes dedicated to the care of this patient on the date of this failed virtual converted to telephone encounter to include pre-visit review of patient's medical history,  recent hospitalization ,  imaging studies and labs, recent lithotripsy,  and post visit ordering of testing and therapeutics.    Verneita LITTIE Kettering, MD

## 2024-03-03 ENCOUNTER — Encounter: Payer: Self-pay | Admitting: Internal Medicine

## 2024-03-03 NOTE — Assessment & Plan Note (Signed)
 Using toujeo  20 units daily and metformin ,  A1c is  < 7.0  no changes today but will consider adding jardiance or januvia pending review of post prandials .  Continue ARB and statin   Lab Results  Component Value Date   HGBA1C 6.7 (H) 02/15/2024   Lab Results  Component Value Date   LABMICR See below: 02/01/2024   LABMICR See below: 08/03/2022   MICROALBUR 15.4 (H) 11/03/2023     Lab Results  Component Value Date   CHOL 156 11/03/2023   HDL 34.70 (L) 11/03/2023   LDLCALC 78 11/03/2023   LDLDIRECT 90.0 11/03/2023   TRIG 216.0 (H) 11/03/2023   CHOLHDL 4 11/03/2023

## 2024-03-03 NOTE — Assessment & Plan Note (Signed)
 S/p lithotripsy on June 21 225

## 2024-03-03 NOTE — Assessment & Plan Note (Addendum)
 Secondary to chronic pressure ulcers and continued nonadherence to offloading.  He is at risk for the consequences of osteomyelitis which include septic emboli and gangrene.  He continues to ignore advice to offload pressure on his right foot. I have encouraged him to offload whenever possible and follow up with podiatry

## 2024-03-04 NOTE — Telephone Encounter (Signed)
 Naproxen  was filled by urgent care provider

## 2024-03-05 ENCOUNTER — Other Ambulatory Visit: Payer: Self-pay | Admitting: Internal Medicine

## 2024-03-05 MED ORDER — NAPROXEN 500 MG PO TABS: 500.0000 mg | ORAL_TABLET | Freq: Two times a day (BID) | ORAL | 0 refills | Status: DC | PRN

## 2024-03-06 ENCOUNTER — Ambulatory Visit: Admitting: Physician Assistant

## 2024-03-06 ENCOUNTER — Other Ambulatory Visit: Payer: Self-pay | Admitting: Physician Assistant

## 2024-03-06 DIAGNOSIS — N201 Calculus of ureter: Secondary | ICD-10-CM

## 2024-03-18 ENCOUNTER — Encounter: Payer: Self-pay | Admitting: Internal Medicine

## 2024-03-26 ENCOUNTER — Ambulatory Visit: Admitting: Physician Assistant

## 2024-03-30 ENCOUNTER — Ambulatory Visit
Admission: RE | Admit: 2024-03-30 | Discharge: 2024-03-30 | Disposition: A | Discharge status: Home / Self Care | Source: Ambulatory Visit | Attending: Physician Assistant | Admitting: Physician Assistant

## 2024-03-30 ENCOUNTER — Ambulatory Visit: Admission: RE | Admit: 2024-03-30 | Source: Ambulatory Visit

## 2024-03-30 DIAGNOSIS — N201 Calculus of ureter: Secondary | ICD-10-CM | POA: Insufficient documentation

## 2024-03-30 LAB — URINALYSIS, COMPLETE (UACMP) WITH MICROSCOPIC
Bacteria, UA: NONE SEEN
Bilirubin Urine: NEGATIVE
Glucose, UA: NEGATIVE mg/dL
Ketones, ur: NEGATIVE mg/dL
Leukocytes,Ua: NEGATIVE
Nitrite: NEGATIVE
Protein, ur: NEGATIVE mg/dL
Specific Gravity, Urine: 1.013 (ref 1.005–1.030)
pH: 5 (ref 5.0–8.0)

## 2024-04-02 ENCOUNTER — Ambulatory Visit: Payer: Self-pay | Admitting: Physician Assistant

## 2024-04-17 ENCOUNTER — Telehealth: Admitting: Physician Assistant

## 2024-04-17 ENCOUNTER — Encounter: Payer: Self-pay | Admitting: Internal Medicine

## 2024-04-17 NOTE — Telephone Encounter (Signed)
  Last Visit: 11/29/2023 Next Visit: Visit date not found Last Refill: E-Prescribing Status: Receipt confirmed by pharmacy (02/29/2024 12:11 PM EDT)   Please Advise

## 2024-04-19 ENCOUNTER — Other Ambulatory Visit: Payer: Self-pay | Admitting: Internal Medicine

## 2024-04-19 MED ORDER — OXYCODONE HCL 5 MG PO TABS: 7.5000 mg | ORAL_TABLET | Freq: Every day | ORAL | 0 refills | Status: DC

## 2024-04-22 ENCOUNTER — Other Ambulatory Visit: Payer: Self-pay

## 2024-04-22 DIAGNOSIS — N201 Calculus of ureter: Secondary | ICD-10-CM

## 2024-04-27 ENCOUNTER — Ambulatory Visit (HOSPITAL_COMMUNITY)
Admission: RE | Admit: 2024-04-27 | Discharge: 2024-04-27 | Disposition: A | Discharge status: Home / Self Care | Source: Ambulatory Visit | Attending: Physician Assistant

## 2024-04-27 DIAGNOSIS — N201 Calculus of ureter: Secondary | ICD-10-CM | POA: Diagnosis present

## 2024-05-01 ENCOUNTER — Encounter: Payer: Self-pay | Admitting: Internal Medicine

## 2024-05-02 DIAGNOSIS — N201 Calculus of ureter: Secondary | ICD-10-CM

## 2024-05-03 MED ORDER — TAMSULOSIN HCL 0.4 MG PO CAPS: 0.4000 mg | ORAL_CAPSULE | Freq: Every day | ORAL | 0 refills | Status: AC

## 2024-05-21 ENCOUNTER — Encounter: Payer: Self-pay | Admitting: Internal Medicine

## 2024-05-22 MED ORDER — OXYCODONE HCL 5 MG PO TABS: 7.5000 mg | ORAL_TABLET | Freq: Every day | ORAL | 0 refills | Status: DC

## 2024-05-22 NOTE — Telephone Encounter (Signed)
 Refill pended below  Requesting: Oxycodone  Contract: No UDS: N/A Last Visit: 11/29/2023 Next Visit: 06/14/2024 Last Refill: 04/19/2024   Please Advise

## 2024-05-23 ENCOUNTER — Encounter: Payer: Self-pay | Admitting: Internal Medicine

## 2024-06-07 ENCOUNTER — Other Ambulatory Visit (INDEPENDENT_AMBULATORY_CARE_PROVIDER_SITE_OTHER)

## 2024-06-07 DIAGNOSIS — Z794 Long term (current) use of insulin: Secondary | ICD-10-CM

## 2024-06-07 DIAGNOSIS — E08621 Diabetes mellitus due to underlying condition with foot ulcer: Secondary | ICD-10-CM | POA: Diagnosis not present

## 2024-06-07 DIAGNOSIS — E785 Hyperlipidemia, unspecified: Secondary | ICD-10-CM

## 2024-06-07 DIAGNOSIS — E1169 Type 2 diabetes mellitus with other specified complication: Secondary | ICD-10-CM | POA: Diagnosis not present

## 2024-06-07 DIAGNOSIS — E1165 Type 2 diabetes mellitus with hyperglycemia: Secondary | ICD-10-CM

## 2024-06-07 DIAGNOSIS — L97412 Non-pressure chronic ulcer of right heel and midfoot with fat layer exposed: Secondary | ICD-10-CM

## 2024-06-07 LAB — COMPREHENSIVE METABOLIC PANEL WITH GFR
ALT: 20 U/L (ref 0–53)
AST: 20 U/L (ref 0–37)
Albumin: 3.8 g/dL (ref 3.5–5.2)
Alkaline Phosphatase: 47 U/L (ref 39–117)
BUN: 16 mg/dL (ref 6–23)
CO2: 28 meq/L (ref 19–32)
Calcium: 8.2 mg/dL — ABNORMAL LOW (ref 8.4–10.5)
Chloride: 101 meq/L (ref 96–112)
Creatinine, Ser: 0.88 mg/dL (ref 0.40–1.50)
GFR: 93.91 mL/min (ref >60.00)
Glucose, Bld: 149 mg/dL — ABNORMAL HIGH (ref 70–99)
Potassium: 4.5 meq/L (ref 3.5–5.1)
Sodium: 137 meq/L (ref 135–145)
Total Bilirubin: 0.7 mg/dL (ref 0.2–1.2)
Total Protein: 7.2 g/dL (ref 6.0–8.3)

## 2024-06-07 LAB — LIPID PANEL
Cholesterol: 107 mg/dL (ref 0–200)
HDL: 38.8 mg/dL — ABNORMAL LOW (ref >39.00)
LDL Cholesterol: 57 mg/dL (ref 0–99)
NonHDL: 68.13
Total CHOL/HDL Ratio: 3
Triglycerides: 56 mg/dL (ref 0.0–149.0)
VLDL: 11.2 mg/dL (ref 0.0–40.0)

## 2024-06-07 LAB — CBC WITH DIFFERENTIAL/PLATELET
Basophils Absolute: 0.2 K/uL — ABNORMAL HIGH (ref 0.0–0.1)
Basophils Relative: 2.3 % (ref 0.0–3.0)
Eosinophils Absolute: 0.6 K/uL (ref 0.0–0.7)
Eosinophils Relative: 7.1 % — ABNORMAL HIGH (ref 0.0–5.0)
HCT: 42.1 % (ref 39.0–52.0)
Hemoglobin: 13.8 g/dL (ref 13.0–17.0)
Lymphocytes Relative: 17 % (ref 12.0–46.0)
Lymphs Abs: 1.4 K/uL (ref 0.7–4.0)
MCHC: 32.7 g/dL (ref 30.0–36.0)
MCV: 87.4 fl (ref 78.0–100.0)
Monocytes Absolute: 1.5 K/uL — ABNORMAL HIGH (ref 0.1–1.0)
Monocytes Relative: 17.3 % — ABNORMAL HIGH (ref 3.0–12.0)
Neutro Abs: 4.7 K/uL (ref 1.4–7.7)
Neutrophils Relative %: 56.3 % (ref 43.0–77.0)
Platelets: 248 K/uL (ref 150.0–400.0)
RBC: 4.82 Mil/uL (ref 4.22–5.81)
RDW: 15.8 % — ABNORMAL HIGH (ref 11.5–15.5)
WBC: 8.4 K/uL (ref 4.0–10.5)

## 2024-06-07 LAB — HEMOGLOBIN A1C: Hgb A1c MFr Bld: 8.1 % — ABNORMAL HIGH (ref 4.6–6.5)

## 2024-06-07 LAB — LDL CHOLESTEROL, DIRECT: Direct LDL: 53 mg/dL

## 2024-06-08 ENCOUNTER — Ambulatory Visit: Payer: Self-pay | Admitting: Internal Medicine

## 2024-06-11 ENCOUNTER — Ambulatory Visit: Admitting: Internal Medicine

## 2024-06-11 ENCOUNTER — Encounter: Payer: Self-pay | Admitting: Internal Medicine

## 2024-06-11 VITALS — BP 126/74 | HR 83 | Ht 70.0 in | Wt 232.2 lb

## 2024-06-11 DIAGNOSIS — E1165 Type 2 diabetes mellitus with hyperglycemia: Secondary | ICD-10-CM

## 2024-06-11 DIAGNOSIS — E669 Obesity, unspecified: Secondary | ICD-10-CM

## 2024-06-11 DIAGNOSIS — M86671 Other chronic osteomyelitis, right ankle and foot: Secondary | ICD-10-CM | POA: Diagnosis not present

## 2024-06-11 DIAGNOSIS — E1169 Type 2 diabetes mellitus with other specified complication: Secondary | ICD-10-CM

## 2024-06-11 DIAGNOSIS — E785 Hyperlipidemia, unspecified: Secondary | ICD-10-CM

## 2024-06-11 DIAGNOSIS — I10 Essential (primary) hypertension: Secondary | ICD-10-CM

## 2024-06-11 DIAGNOSIS — E119 Type 2 diabetes mellitus without complications: Secondary | ICD-10-CM

## 2024-06-11 DIAGNOSIS — Z23 Encounter for immunization: Secondary | ICD-10-CM

## 2024-06-11 DIAGNOSIS — Z794 Long term (current) use of insulin: Secondary | ICD-10-CM

## 2024-06-11 DIAGNOSIS — Z7984 Long term (current) use of oral hypoglycemic drugs: Secondary | ICD-10-CM

## 2024-06-11 MED ORDER — OXYCODONE HCL 5 MG PO TABS: 7.5000 mg | ORAL_TABLET | Freq: Every day | ORAL | 0 refills | Status: DC

## 2024-06-11 MED ORDER — METFORMIN HCL ER 500 MG PO TB24: ORAL_TABLET | ORAL | 1 refills | Status: AC

## 2024-06-11 MED ORDER — TELMISARTAN 20 MG PO TABS: 20.0000 mg | ORAL_TABLET | Freq: Every day | ORAL | 1 refills | Status: AC

## 2024-06-11 MED ORDER — ATORVASTATIN CALCIUM 20 MG PO TABS: 20.0000 mg | ORAL_TABLET | Freq: Every day | ORAL | 3 refills | Status: AC

## 2024-06-11 MED ORDER — TOUJEO MAX SOLOSTAR 300 UNIT/ML ~~LOC~~ SOPN: 20.0000 [IU] | PEN_INJECTOR | Freq: Every day | SUBCUTANEOUS | 1 refills | Status: DC

## 2024-06-11 NOTE — Progress Notes (Unsigned)
 Subjective:  Patient ID: [name], male    DOB: August 22, 1966  Age: 58 y.o. MRN: [id number]  CC: The primary encounter diagnosis was Need for influenza vaccination. 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   HPI KEIONTE SWICEGOOD presents for  Chief Complaint  Patient presents with   Medical Management of Chronic Issues    3 month follow up     1) type 2 D M:   he  feels generally well,  But is not  exercising regularly  due to his prior metatarsal  amputations, persistent foot ulcers,  and full time job.    He has gained weight . Checking  blood sugars less than once daily at variable times, usually only if he feels she may be having a hypoglycemic event. . . Appetite is  large.  Meds:   using 20 units of toujeo  and 500 mg metformin   .  Has no  history of pancreatitis or thyroid  CA    Lab Results  Component Value Date   HGBA1C 8.1 (H) 06/07/2024   2) skin cancers removed from abdomen and  left arm  by Arlee Molly ;  biopsy results not available. Stitches removed yesterday ,  was given antibiotics   3) Depression: he is slowly adjusting to his estrangement from his wife and living alone.    Outpatient Medications Prior to Visit  Medication Sig Dispense Refill   aspirin  EC 81 MG tablet Take 81 mg by mouth daily. Swallow whole.     clindamycin (CLEOCIN) 300 MG capsule Take 300 mg by mouth 3 (three) times daily.     DULoxetine  (CYMBALTA ) 60 MG capsule TAKE 1 CAPSULE BY MOUTH EVERY DAY 90 capsule 2   Insulin  Pen Needle (PEN NEEDLES) 32G X 6 MM MISC Use to give insulin  once daily. 100 each 3   Multiple Vitamins-Minerals (MULTIVITAMIN WITH MINERALS) tablet  Take 1 tablet by mouth daily.     naproxen  (NAPROSYN ) 500 MG tablet Take 1 tablet (500 mg total) by mouth every 12 (twelve) hours as needed for mild pain (pain score 1-3). 60 tablet 0   SODIUM FLUORIDE 5000 SENSITIVE 1.1-5 % GEL Brush as directed by your provider.     tamsulosin  (FLOMAX ) 0.4 MG CAPS capsule Take 1 capsule (0.4 mg total) by mouth daily. 30 capsule 0   atorvastatin  (LIPITOR) 20 MG tablet Take 1 tablet (20 mg total) by mouth daily. 90 tablet 3   insulin  glargine, 2 Unit Dial, (TOUJEO  MAX SOLOSTAR) 300 UNIT/ML Solostar Pen Inject 20 Units into the skin daily. 15 mL 0   metFORMIN  (GLUCOPHAGE -XR) 500 MG 24 hr tablet TAKE 1 TABLET BY MOUTH EVERY DAY WITH BREAKFAST 90 tablet 1   oxyCODONE  (ROXICODONE ) 5 MG immediate release tablet Take 1.5 tablets (7.5 mg total) by mouth daily. As needed for severe pain 45 tablet 0   oxyCODONE  (ROXICODONE ) 5 MG immediate release tablet Take 1.5 tablets (7.5 mg total) by mouth daily. As needed for severe pain 45 tablet 0   telmisartan  (MICARDIS ) 40 MG tablet TAKE 1 TABLET BY MOUTH EVERYDAY AT BEDTIME (Patient taking differently: Take 20 mg by mouth daily. TAKE 1 TABLET BY MOUTH EVERYDAY AT BEDTIME) 90 tablet 1   No facility-administered medications  prior to visit.    Review of Systems;  Patient denies headache, fevers, malaise, unintentional weight loss, skin rash, eye pain, sinus congestion and sinus pain, sore throat, dysphagia,  hemoptysis , cough, dyspnea, wheezing, chest pain, palpitations, orthopnea, edema, abdominal pain, nausea, melena, diarrhea, constipation, flank pain, dysuria, hematuria, urinary  Frequency, nocturia, numbness, tingling, seizures,  Focal weakness, Loss of consciousness,  Tremor, insomnia, depression, anxiety, and suicidal ideation.      Objective:  BP 126/74   Pulse 83   Ht 5' 10 (1.778 m)   Wt 232 lb 3.2 oz (105.3 kg)   SpO2 98%   BMI 33.32 kg/m   BP Readings from Last 3 Encounters:  06/11/24 126/74  02/17/24  127/77  02/08/24 131/72    Wt Readings from Last 3 Encounters:  06/11/24 232 lb 3.2 oz (105.3 kg)  02/15/24 225 lb (102.1 kg)  02/08/24 220 lb (99.8 kg)    Physical Exam Vitals reviewed.  Constitutional:      General: He is not in acute distress.    Appearance: Normal appearance. He is normal weight. He is not ill-appearing, toxic-appearing or diaphoretic.  HENT:     Head: Normocephalic.  Eyes:     General: No scleral icterus.       Right eye: No discharge.        Left eye: No discharge.     Conjunctiva/sclera: Conjunctivae normal.  Cardiovascular:     Rate and Rhythm: Normal rate and regular rhythm.     Pulses: Normal pulses.     Heart sounds: Normal heart sounds.  Pulmonary:     Effort: Pulmonary effort is normal. No respiratory distress.     Breath sounds: Normal breath sounds.  Abdominal:     General: There is distension.     Palpations: Abdomen is soft.     Tenderness: There is no abdominal tenderness.  Musculoskeletal:        General: Normal range of motion.     Cervical back: Normal range of motion.  Skin:    General: Skin is warm and dry.  Neurological:     General: No focal deficit present.     Mental Status: He is alert and oriented to person, place, and time. Mental status is at baseline.  Psychiatric:        Mood and Affect: Mood normal.        Behavior: Behavior normal.        Thought Content: Thought content normal.        Judgment: Judgment normal.     Lab Results  Component Value Date   HGBA1C 8.1 (H) 06/07/2024   HGBA1C 6.7 (H) 02/15/2024   HGBA1C 9.6 (H) 11/03/2023    Lab Results  Component Value Date   CREATININE 0.88 06/07/2024   CREATININE 1.00 02/17/2024   CREATININE 1.04 02/16/2024    Lab Results  Component Value Date   WBC 8.4 06/07/2024   HGB 13.8 06/07/2024   HCT 42.1 06/07/2024   PLT 248.0 06/07/2024   GLUCOSE 149 (H) 06/07/2024   CHOL 107 06/07/2024   TRIG 56.0 06/07/2024   HDL 38.80 (L) 06/07/2024   LDLDIRECT 53.0  06/07/2024   LDLCALC 57 06/07/2024   ALT 20 06/07/2024   AST 20 06/07/2024   NA 137 06/07/2024   K 4.5 06/07/2024   CL 101 06/07/2024   CREATININE 0.88 06/07/2024   BUN 16 06/07/2024   CO2 28 06/07/2024   TSH 0.81 11/03/2023   PSA 0.18 02/22/2022  INR 1.2 02/15/2024   HGBA1C 8.1 (H) 06/07/2024   MICROALBUR 15.4 (H) 11/03/2023    Ultrasound renal complete Result Date: 04/27/2024 CLINICAL DATA:  Initial evaluation for kidney stones. EXAM: RENAL / URINARY TRACT ULTRASOUND COMPLETE COMPARISON:  Prior radiograph from 03/30/2024. FINDINGS: Right Kidney: Renal measurements: 12.6 x 6.3 x 5.6 cm = volume: 233.1 mL. Renal echogenicity within normal limits. No nephrolithiasis or hydronephrosis. No focal renal mass. Left Kidney: Renal measurements: 11.8 x 6.7 x 5.9 cm = volume: 240.8 mL. Renal echogenicity within normal limits. No nephrolithiasis. Mild hydronephrosis is seen. No focal renal mass. Bladder: Appears normal for degree of bladder distention. Other: None. IMPRESSION: 1. Mild left-sided hydronephrosis. No visible nephrolithiasis by sonography. 2. Normal sonographic appearance of the right kidney. Electronically Signed   By: Morene Hoard M.D.   On: 04/27/2024 22:05    Assessment & Plan:  .Need for influenza vaccination -     Flu vaccine trivalent PF, 6mos and older(Flulaval,Afluria,Fluarix,Fluzone )  Hyperlipidemia associated with type 2 diabetes mellitus (HCC) Assessment & Plan:  Well controlled with  atorvastatin  20 mg .  No changes today  Lab Results  Component Value Date   HGBA1C 8.1 (H) 06/07/2024   Lab Results  Component Value Date   CHOL 107 06/07/2024   HDL 38.80 (L) 06/07/2024   LDLCALC 57 06/07/2024   LDLDIRECT 53.0 06/07/2024   TRIG 56.0 06/07/2024   CHOLHDL 3 06/07/2024   Lab Results  Component Value Date   LABMICR See below: 02/01/2024   LABMICR See below: 08/03/2022   MICROALBUR 15.4 (H) 11/03/2023        Orders: -     Lipid panel; Future -      LDL cholesterol, direct; Future  Uncontrolled type 2 diabetes mellitus with hyperglycemia, with long-term current use of insulin  (HCC) -     Hemoglobin A1c; Future -     Comprehensive metabolic panel with GFR; Future  Need for pneumococcal 20-valent conjugate vaccination -     Pneumococcal conjugate vaccine 20-valent  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  He is at risk for the consequences of osteomyelitis which include septic emboli and gangrene.  He continues to defer continuous offloading of  pressure on his right foot. I have encouraged him to offload whenever possible and follow up with podiatry    Obesity, diabetes, and hypertension syndrome (HCC) Assessment & Plan: Uncontrolled,  with weight gain on current regimen of Toujeo  and metformin .  Patient has been unable to lose or maintain a healthy weight  Screened for contraindications to use of  GLP 1 agonists for appetite suppression and he has none.  The risks and benefits of pharmacotherapy discussed .  Trial of Muonjaro with starting dose samples given of 2.5 mg weekly   CBG monitor placed as well for monitoring of glycemic control given rise in A1c.  Continue telmisartan  at 20 mg dose given improvement in BP despite non adherence to prior medication change   Lab Results  Component Value Date   HGBA1C 8.1 (H) 06/07/2024   Lab Results  Component Value Date   LABMICR See below: 02/01/2024   LABMICR See below: 08/03/2022   MICROALBUR 15.4 (H) 11/03/2023        Other orders -     Atorvastatin  Calcium ; Take 1 tablet (20 mg total) by mouth daily.  Dispense: 90 tablet; Refill: 3 -     Toujeo  Max SoloStar; Inject  20 Units into the skin daily.  Dispense: 15 mL; Refill: 1 -     metFORMIN  HCl ER; TAKE 1 TABLET BY MOUTH EVERY DAY WITH BREAKFAST  Dispense: 90 tablet; Refill: 1 -     Telmisartan ; Take 1 tablet (20 mg total)  by mouth daily. TAKE 1 TABLET BY MOUTH EVERYDAY AT BEDTIME  Dispense: 90 tablet; Refill: 1 -     oxyCODONE  HCl; Take 1.5 tablets (7.5 mg total) by mouth daily. As needed for severe pain  Dispense: 45 tablet; Refill: 0 -     oxyCODONE  HCl; Take 1.5 tablets (7.5 mg total) by mouth daily. As needed for severe pain  Dispense: 45 tablet; Refill: 0 -     oxyCODONE  HCl; Take 1.5 tablets (7.5 mg total) by mouth daily. As needed for severe pain  Dispense: 45 tablet; Refill: 0    I personally spent a total of 30 minutes in the care of the patient today including getting/reviewing separately obtained history, performing a medically appropriate exam/evaluation, counseling and educating, placing orders, and documenting clinical information in the EHR.  Follow-up: Return in about 3 months (around 09/11/2024) for follow up diabetes.   Verneita LITTIE Kettering, MD

## 2024-06-11 NOTE — Patient Instructions (Addendum)
 Do not increase your telmisartan  to 40 mg   , continue taking 20 mg daily   I am  adding Mounjaro to help you lose weight  and control your diabetes . You may choose to stop your metformin  if you do not tolerate the combination , but metformin  might help keep you from getting constipated   Mounjaro is a medication that is taken as a weekly subcutaneous injection. It is not insulin .  It  causes your pancreas to increase its  own insulin  secretion  And also slows down the emptying of your stomach,  So it decreases your appetite and helps you lose weight.  The dose for the first 4 weekly doses is 2.5 mg.  You may have mild nausea on the first or second day but this should resolve.  If not  ,  stop the medication.   As long as you are losing weight,  you can continue the dose you are on .  Only  request and increase in  the dose to 5.0 5 mg after 4 weeks if your weight has plateaued.  Let me know when you need a refill and what dose you are taking.    You are overdue for your tetanus and pneumonia vaccines ;   you received pneumonia and flu vaccines today  You can get your tetanus vaccine in 3 months   Send me a reminder in 2 weeks to review your blood sugas.

## 2024-06-12 DIAGNOSIS — E119 Type 2 diabetes mellitus without complications: Secondary | ICD-10-CM | POA: Insufficient documentation

## 2024-06-12 NOTE — Assessment & Plan Note (Signed)
 Well controlled with  atorvastatin  20 mg .  No changes today  Lab Results  Component Value Date   HGBA1C 8.1 (H) 06/07/2024   Lab Results  Component Value Date   CHOL 107 06/07/2024   HDL 38.80 (L) 06/07/2024   LDLCALC 57 06/07/2024   LDLDIRECT 53.0 06/07/2024   TRIG 56.0 06/07/2024   CHOLHDL 3 06/07/2024   Lab Results  Component Value Date   LABMICR See below: 02/01/2024   LABMICR See below: 08/03/2022   MICROALBUR 15.4 (H) 11/03/2023

## 2024-06-12 NOTE — Assessment & Plan Note (Addendum)
 Uncontrolled,  with weight gain on current regimen of Toujeo  and metformin .  Patient has been unable to lose or maintain a healthy weight  Screened for contraindications to use of  GLP 1 agonists for appetite suppression and he has none.  The risks and benefits of pharmacotherapy discussed .  Trial of Muonjaro with starting dose samples given of 2.5 mg weekly   CBG monitor placed as well for monitoring of glycemic control given rise in A1c.  Continue telmisartan  at 20 mg dose given improvement in BP despite non adherence to prior medication change   Lab Results  Component Value Date   HGBA1C 8.1 (H) 06/07/2024   Lab Results  Component Value Date   LABMICR See below: 02/01/2024   LABMICR See below: 08/03/2022   MICROALBUR 15.4 (H) 11/03/2023

## 2024-06-12 NOTE — Assessment & Plan Note (Signed)
 Secondary to chronic pressure ulcers and continued nonadherence to offloading.  He is at risk for the consequences of osteomyelitis which include septic emboli and gangrene.  He continues to defer continuous offloading of  pressure on his right foot. I have encouraged him to offload whenever possible and follow up with podiatry

## 2024-06-14 ENCOUNTER — Ambulatory Visit: Admitting: Internal Medicine

## 2024-06-16 ENCOUNTER — Ambulatory Visit
Admission: EM | Admit: 2024-06-16 | Discharge: 2024-06-16 | Disposition: A | Discharge status: Home / Self Care | Attending: Emergency Medicine | Admitting: Emergency Medicine

## 2024-06-16 DIAGNOSIS — M7712 Lateral epicondylitis, left elbow: Secondary | ICD-10-CM | POA: Diagnosis not present

## 2024-06-16 DIAGNOSIS — M778 Other enthesopathies, not elsewhere classified: Secondary | ICD-10-CM

## 2024-06-16 MED ORDER — METHYLPREDNISOLONE 4 MG PO TBPK
ORAL_TABLET | ORAL | 0 refills | Status: DC
Start: 2024-06-16 — End: 2024-07-08

## 2024-06-16 NOTE — Discharge Instructions (Addendum)
 Take the Medrol  Dosepak to decrease inflammation in the muscles and tendons in your left forearm.  Follow the package instructions for dosing.  You may apply moist heat to your forearm for 20 minutes at a time, 2-3 times a day, to help improve blood flow to the muscle group and aid in healing.  I have given you home physical therapy exercises in your discharge packet that you may do at home which should also help improve your pain.  If you have any continued or worsening symptoms either return for reevaluation or follow-up with orthopedics such as [facility] here in [place] or in [place].

## 2024-06-16 NOTE — ED Triage Notes (Signed)
 Patient states that he has tendonitis in his left arm that's flared up since Monday

## 2024-06-16 NOTE — ED Provider Notes (Signed)
 [facility]    CSN: [id number] Arrival date & time: 06/16/24  1100      History   Chief Complaint Chief Complaint  Patient presents with   Arm Pain    HPI [name] is a 58 y.o. male.   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  Past Medical History:  Diagnosis Date   Acute prostatitis without hematuria 05/20/2022   Allergy    Anxiety    Atypical pneumonia 12/18/2022   Complication of anesthesia    pt states at [facility] he stopped breathing due to his sleep apnea-hard to wake up   COVID-19    Depression    Diabetic ulcer of both feet (HCC)    DM (diabetes mellitus), type 2 (HCC)    History of kidney stones    History of methicillin resistant staphylococcus aureus (MRSA) 09/2020   foot   Neuromuscular disorder (HCC)    neuropathy of back and bilateral feet   Obesity    Sleep apnea    does not use cpap    Patient Active Problem List   Diagnosis Date Noted   Obesity, diabetes, and hypertension syndrome (HCC) 06/12/2024   Kidney stone on left side 02/15/2024   Dyslipidemia 02/15/2024   Essential hypertension 02/15/2024   Type 2 diabetes mellitus with peripheral neuropathy (HCC) 02/15/2024   Uncontrolled type 2 diabetes mellitus with hyperglycemia, with long-term current use of insulin  (HCC) 07/11/2023   HTN (hypertension) 07/11/2023   Obesity, Class III, BMI 40-49.9 (morbid obesity) (HCC) 07/11/2023   Constipation 05/20/2022   S/P transmetatarsal amputation of foot, right (HCC) 10/25/2020   Major depressive disorder, recurrent 10/25/2020   Polyneuropathy, 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   Diabetic ulcer of foot associated with diabetes mellitus due to underlying condition, with fat layer exposed  (HCC) 11/29/2019   Hyperlipidemia associated with type 2 diabetes mellitus (HCC) 09/22/2018   Neuropathic ulcer of right foot with fat layer exposed (HCC) 07/14/2015   Vitamin D  deficiency 11/08/2013   Routine general medical examination at a health care facility 05/09/2013   Chronic refractory osteomyelitis of right foot (HCC) 01/24/2013   Idiopathic peripheral neuropathy 01/24/2013   OSA (obstructive sleep apnea) 01/24/2013   Elevated hemoglobin 01/24/2013    Past Surgical History:  Procedure Laterality Date   AMPUTATION Left    toe amputation   CYSTOSCOPY/URETEROSCOPY/HOLMIUM LASER/STENT PLACEMENT Right 07/29/2022   Procedure: CYSTOSCOPY/URETEROSCOPY/HOLMIUM LASER/STENT PLACEMENT;  Surgeon: Francisca Redell JAYSON, MD;  Location: [facility];  Service: Urology;  Laterality: Right;   EXTRACORPOREAL SHOCK WAVE LITHOTRIPSY Left 02/08/2024   Procedure: LITHOTRIPSY, ESWL;  Surgeon: Twylla Glendia JAYSON, MD;  Location: [facility];  Service: Urology;  Laterality: Left;   EXTRACORPOREAL SHOCK WAVE LITHOTRIPSY Left 02/15/2024   Procedure: LITHOTRIPSY, ESWL;  Surgeon: Penne Knee, MD;  Location: [facility];  Service: Urology;  Laterality: Left;   FOOT SURGERY Right 09/2012   cyst removed   LOWER EXTREMITY ANGIOGRAPHY Right 07/12/2023   Procedure: Lower Extremity Angiography;  Surgeon: Marea Selinda RAMAN, MD;  Location: [facility];  Service: Cardiovascular;  Laterality: Right;   SPINE SURGERY  2004   nerve damage in spine   TOE AMPUTATION Right 06/30/2016   5 toes       Home Medications    Prior to Admission medications   Medication  Sig Start Date End Date Taking? Authorizing Provider  aspirin  EC 81 MG tablet Take 81 mg by mouth daily. Swallow whole.   Yes [provider]  atorvastatin  (LIPITOR) 20 MG tablet Take 1 tablet (20 mg total) by mouth daily. 06/11/24  Yes Tullo, Teresa L, MD  clindamycin (CLEOCIN) 300 MG capsule Take 300 mg by mouth 3 (three) times daily. 06/10/24  Yes [provider]  DULoxetine  (CYMBALTA ) 60 MG capsule TAKE 1 CAPSULE BY MOUTH EVERY DAY 02/05/24  Yes Marylynn Verneita CROME, MD  insulin  glargine, 2 Unit Dial, (TOUJEO  MAX SOLOSTAR) 300 UNIT/ML Solostar Pen Inject 20 Units into the skin daily. 06/11/24  Yes Marylynn Verneita CROME, MD  Insulin  Pen Needle (PEN NEEDLES) 32G X 6 MM MISC Use to give insulin  once daily. 12/05/23  Yes Marylynn Verneita CROME, MD  metFORMIN  (GLUCOPHAGE -XR) 500 MG 24 hr tablet TAKE 1 TABLET BY MOUTH EVERY DAY WITH BREAKFAST 06/11/24  Yes Marylynn Verneita CROME, MD  methylPREDNISolone  (MEDROL  DOSEPAK) 4 MG TBPK tablet Take according to the package insert. 06/16/24  Yes Bernardino Ditch, NP  Multiple Vitamins-Minerals (MULTIVITAMIN WITH MINERALS) tablet Take 1 tablet by mouth daily.   Yes [provider]  oxyCODONE  (ROXICODONE ) 5 MG immediate release tablet Take 1.5 tablets (7.5 mg total) by mouth daily. As needed for severe pain 06/11/24  Yes Marylynn Verneita CROME, MD  oxyCODONE  (ROXICODONE ) 5 MG immediate release tablet Take 1.5 tablets (7.5 mg total) by mouth daily. As needed for severe pain 06/11/24  Yes Marylynn Verneita CROME, MD  oxyCODONE  (ROXICODONE ) 5 MG immediate release tablet Take 1.5 tablets (7.5 mg total) by mouth daily. As needed for severe pain 06/11/24  Yes Marylynn Verneita CROME, MD  SODIUM FLUORIDE 5000 SENSITIVE 1.1-5 % GEL Brush as directed by your provider. 06/20/23  Yes [provider]  tamsulosin  (FLOMAX ) 0.4 MG CAPS capsule Take 1 capsule (0.4 mg total) by mouth daily. 05/03/24  Yes Vaillancourt, Samantha, PA-C  telmisartan  (MICARDIS ) 20 MG tablet Take 1 tablet (20 mg total) by mouth daily. TAKE 1 TABLET BY MOUTH EVERYDAY AT BEDTIME 06/11/24  Yes Marylynn Verneita CROME, MD    Family History Family History  Problem Relation Age of Onset   Heart disease Father    Stroke Father    Heart attack Father 31   Diabetes Father    Heart disease Maternal Grandmother    Stroke Maternal Grandmother    Heart disease Paternal Grandfather    Stroke Paternal  Grandfather     Social History Social History   Tobacco Use   Smoking status: Never    Passive exposure: Never   Smokeless tobacco: Never  Vaping Use   Vaping status: Never Used  Substance Use Topics   Alcohol use: Never   Drug use: Never     Allergies   Patient has no known allergies.   Review of Systems Review of Systems  Musculoskeletal:  Positive for arthralgias. Negative for joint swelling.  Skin:  Negative for color change.     Physical Exam Triage Vital Signs ED Triage Vitals  Encounter Vitals Group     BP 06/16/24 1109 133/78     Girls Systolic BP Percentile --      Girls Diastolic BP Percentile --      Boys Systolic BP Percentile --      Boys Diastolic BP Percentile --      Pulse Rate 06/16/24 1109 77     Resp 06/16/24 1109 18     Temp  06/16/24 1109 98.8 F (37.1 C)     Temp Source 06/16/24 1109 Oral     SpO2 06/16/24 1109 94 %     Weight 06/16/24 1108 230 lb (104.3 kg)     Height --      Head Circumference --      Peak Flow --      Pain Score 06/16/24 1108 9     Pain Loc --      Pain Education --      Exclude from Growth Chart --    No data found.  Updated Vital Signs BP 133/78 (BP Location: Right Arm)   Pulse 77   Temp 98.8 F (37.1 C) (Oral)   Resp 18   Wt 230 lb (104.3 kg)   SpO2 94%   BMI 33.00 kg/m   Visual Acuity Right Eye Distance:   Left Eye Distance:   Bilateral Distance:    Right Eye Near:   Left Eye Near:    Bilateral Near:     Physical Exam Vitals and nursing note reviewed.  Constitutional:      Appearance: Normal appearance. He is not ill-appearing.  HENT:     Head: Normocephalic and atraumatic.  Musculoskeletal:        General: Tenderness present. No swelling or signs of injury.  Skin:    General: Skin is warm and dry.     Capillary Refill: Capillary refill takes less than 2 seconds.     Findings: No bruising or erythema.  Neurological:     General: No focal deficit present.     Mental Status: He is alert  and oriented to person, place, and time.      UC Treatments / Results  Labs (all labs ordered are listed, but only abnormal results are displayed) Labs Reviewed - No data to display  EKG   Radiology No results found.  Procedures Procedures (including critical care time)  Medications Ordered in UC Medications - No data to display  Initial Impression / Assessment and Plan / UC Course  I have reviewed the triage vital signs and the nursing notes.  Pertinent labs & imaging results that were available during my care of the patient were reviewed by me and considered in my medical decision making (see chart for details).   Patient is a nontoxic-appearing 58 year old male presenting for evaluation of pain in his left arm that started 6 days ago.  He reports that he was using the pulldown machine in the gym and thinks he may have overdone it.  He reports that he will experience the pain in the forearm, in the elbow, and occasionally in the bicep.  Today it is primarily located in the elbow.  He has no tenderness with palpation of the distal forearm though he does have some tenderness with palpation of the flexor carpi radialis as well as some mild tenderness over the lateral epicondyle of the humerus.  He has normal range of motion.  His sensation is intact.  Radial and ulnar pulses are 2+.  Cap refills less than 3 seconds.  I will treat him for tendinopathy of the left forearm with a Medrol  Dosepak.  I will also give him home physical therapy exercises to perform to help with lateral epicondylitis.   Final Clinical Impressions(s) / UC Diagnoses   Final diagnoses:  Tendinitis of left forearm  Lateral epicondylitis of left elbow     Discharge Instructions      Take the Medrol  Dosepak to decrease inflammation  in the muscles and tendons in your left forearm.  Follow the package instructions for dosing.  You may apply moist heat to your forearm for 20 minutes at a time, 2-3 times a day,  to help improve blood flow to the muscle group and aid in healing.  I have given you home physical therapy exercises in your discharge packet that you may do at home which should also help improve your pain.  If you have any continued or worsening symptoms either return for reevaluation or follow-up with orthopedics such as [facility] here in [place] or in [place].     ED Prescriptions     Medication Sig Dispense Auth. Provider   methylPREDNISolone  (MEDROL  DOSEPAK) 4 MG TBPK tablet Take according to the package insert. 1 each Bernardino Ditch, NP      PDMP not reviewed this encounter.   Bernardino Ditch, NP 06/16/24 1124

## 2024-06-24 ENCOUNTER — Encounter: Payer: Self-pay | Admitting: Internal Medicine

## 2024-06-27 ENCOUNTER — Other Ambulatory Visit: Payer: Self-pay | Admitting: Internal Medicine

## 2024-06-27 ENCOUNTER — Telehealth: Payer: Self-pay | Admitting: *Deleted

## 2024-06-27 NOTE — Telephone Encounter (Unsigned)
 Copied from CRM [id number]. Topic: General - Other >> Jun 27, 2024 11:18 AM Alexandria E wrote: Reason for CRM: FYI - Patient stated he will be coming by the office today to pick up his G7 sensor.

## 2024-06-27 NOTE — Telephone Encounter (Signed)
 Up front in cabinet for pick up.

## 2024-06-27 NOTE — Telephone Encounter (Signed)
 FYI..The sensor has been picked up.

## 2024-06-27 NOTE — Telephone Encounter (Unsigned)
 Copied from CRM #[id number]. Topic: Clinical - Medical Advice >> Jun 27, 2024 11:20 AM [name] wrote: Reason for CRM: Patient stated he was given samples of Mounjaro with the last dose being 11/5, questioning if he needs more samples or if PCP wants to prescribe that medication in full.

## 2024-07-08 ENCOUNTER — Encounter: Payer: Self-pay | Admitting: Family

## 2024-07-08 ENCOUNTER — Ambulatory Visit: Payer: Self-pay | Admitting: Family

## 2024-07-08 ENCOUNTER — Emergency Department

## 2024-07-08 ENCOUNTER — Inpatient Hospital Stay
Admission: EM | Admit: 2024-07-08 | Discharge: 2024-07-10 | DRG: 623 | Disposition: A | Discharge status: Home / Self Care | Attending: Student | Admitting: Student

## 2024-07-08 ENCOUNTER — Observation Stay

## 2024-07-08 ENCOUNTER — Ambulatory Visit (INDEPENDENT_AMBULATORY_CARE_PROVIDER_SITE_OTHER): Admitting: Family

## 2024-07-08 ENCOUNTER — Other Ambulatory Visit: Payer: Self-pay

## 2024-07-08 ENCOUNTER — Encounter: Payer: Self-pay | Admitting: Infectious Diseases

## 2024-07-08 ENCOUNTER — Ambulatory Visit
Admission: RE | Admit: 2024-07-08 | Discharge: 2024-07-08 | Disposition: A | Discharge status: Home / Self Care | Source: Ambulatory Visit | Attending: Family | Admitting: Family

## 2024-07-08 VITALS — BP 130/74 | HR 80 | Temp 98.7°F | Ht 69.0 in | Wt 232.4 lb

## 2024-07-08 DIAGNOSIS — L97419 Non-pressure chronic ulcer of right heel and midfoot with unspecified severity: Secondary | ICD-10-CM | POA: Diagnosis present

## 2024-07-08 DIAGNOSIS — E11621 Type 2 diabetes mellitus with foot ulcer: Principal | ICD-10-CM | POA: Diagnosis present

## 2024-07-08 DIAGNOSIS — Z8616 Personal history of COVID-19: Secondary | ICD-10-CM

## 2024-07-08 DIAGNOSIS — E08621 Diabetes mellitus due to underlying condition with foot ulcer: Secondary | ICD-10-CM

## 2024-07-08 DIAGNOSIS — I10 Essential (primary) hypertension: Secondary | ICD-10-CM | POA: Diagnosis present

## 2024-07-08 DIAGNOSIS — L03116 Cellulitis of left lower limb: Secondary | ICD-10-CM | POA: Diagnosis not present

## 2024-07-08 DIAGNOSIS — Z89422 Acquired absence of other left toe(s): Secondary | ICD-10-CM

## 2024-07-08 DIAGNOSIS — L02611 Cutaneous abscess of right foot: Secondary | ICD-10-CM | POA: Diagnosis not present

## 2024-07-08 DIAGNOSIS — Z794 Long term (current) use of insulin: Secondary | ICD-10-CM

## 2024-07-08 DIAGNOSIS — E1165 Type 2 diabetes mellitus with hyperglycemia: Secondary | ICD-10-CM | POA: Diagnosis present

## 2024-07-08 DIAGNOSIS — M79604 Pain in right leg: Secondary | ICD-10-CM | POA: Insufficient documentation

## 2024-07-08 DIAGNOSIS — Z89431 Acquired absence of right foot: Secondary | ICD-10-CM

## 2024-07-08 DIAGNOSIS — Z79899 Other long term (current) drug therapy: Secondary | ICD-10-CM

## 2024-07-08 DIAGNOSIS — L97522 Non-pressure chronic ulcer of other part of left foot with fat layer exposed: Secondary | ICD-10-CM

## 2024-07-08 DIAGNOSIS — Z7984 Long term (current) use of oral hypoglycemic drugs: Secondary | ICD-10-CM

## 2024-07-08 DIAGNOSIS — Z8614 Personal history of Methicillin resistant Staphylococcus aureus infection: Secondary | ICD-10-CM

## 2024-07-08 DIAGNOSIS — F32A Depression, unspecified: Secondary | ICD-10-CM | POA: Diagnosis present

## 2024-07-08 DIAGNOSIS — L03115 Cellulitis of right lower limb: Secondary | ICD-10-CM | POA: Diagnosis present

## 2024-07-08 DIAGNOSIS — L03031 Cellulitis of right toe: Secondary | ICD-10-CM | POA: Diagnosis present

## 2024-07-08 DIAGNOSIS — M86671 Other chronic osteomyelitis, right ankle and foot: Secondary | ICD-10-CM

## 2024-07-08 DIAGNOSIS — L97512 Non-pressure chronic ulcer of other part of right foot with fat layer exposed: Secondary | ICD-10-CM

## 2024-07-08 DIAGNOSIS — M7989 Other specified soft tissue disorders: Secondary | ICD-10-CM | POA: Diagnosis present

## 2024-07-08 DIAGNOSIS — Z8249 Family history of ischemic heart disease and other diseases of the circulatory system: Secondary | ICD-10-CM

## 2024-07-08 DIAGNOSIS — Z833 Family history of diabetes mellitus: Secondary | ICD-10-CM

## 2024-07-08 DIAGNOSIS — Z7982 Long term (current) use of aspirin: Secondary | ICD-10-CM

## 2024-07-08 DIAGNOSIS — F419 Anxiety disorder, unspecified: Secondary | ICD-10-CM | POA: Diagnosis present

## 2024-07-08 DIAGNOSIS — Z87442 Personal history of urinary calculi: Secondary | ICD-10-CM

## 2024-07-08 DIAGNOSIS — E11628 Type 2 diabetes mellitus with other skin complications: Secondary | ICD-10-CM | POA: Diagnosis present

## 2024-07-08 DIAGNOSIS — E1140 Type 2 diabetes mellitus with diabetic neuropathy, unspecified: Secondary | ICD-10-CM | POA: Diagnosis present

## 2024-07-08 DIAGNOSIS — E669 Obesity, unspecified: Secondary | ICD-10-CM | POA: Diagnosis present

## 2024-07-08 DIAGNOSIS — Z823 Family history of stroke: Secondary | ICD-10-CM

## 2024-07-08 LAB — CBC WITH DIFFERENTIAL/PLATELET
Abs Immature Granulocytes: 0.08 K/uL — ABNORMAL HIGH (ref 0.00–0.07)
Basophils Absolute: 0.2 K/uL — ABNORMAL HIGH (ref 0.0–0.1)
Basophils Absolute: 0.1 K/uL (ref 0.0–0.1)
Basophils Relative: 1.9 % (ref 0.0–3.0)
Basophils Relative: 1 %
Eosinophils Absolute: 0.7 K/uL (ref 0.0–0.7)
Eosinophils Absolute: 0.8 K/uL — ABNORMAL HIGH (ref 0.0–0.5)
Eosinophils Relative: 7.5 % — ABNORMAL HIGH (ref 0.0–5.0)
Eosinophils Relative: 7 %
HCT: 40.5 % (ref 39.0–52.0)
HCT: 40.8 % (ref 39.0–52.0)
Hemoglobin: 13.3 g/dL (ref 13.0–17.0)
Hemoglobin: 13.4 g/dL (ref 13.0–17.0)
Immature Granulocytes: 1 %
Lymphocytes Relative: 15.3 % (ref 12.0–46.0)
Lymphocytes Relative: 14 %
Lymphs Abs: 1.3 K/uL (ref 0.7–4.0)
Lymphs Abs: 1.5 K/uL (ref 0.7–4.0)
MCH: 28.5 pg (ref 26.0–34.0)
MCHC: 32.9 g/dL (ref 30.0–36.0)
MCHC: 32.8 g/dL (ref 30.0–36.0)
MCV: 86.5 fl (ref 78.0–100.0)
MCV: 86.6 fL (ref 80.0–100.0)
Monocytes Absolute: 1.1 K/uL — ABNORMAL HIGH (ref 0.1–1.0)
Monocytes Absolute: 1.4 K/uL — ABNORMAL HIGH (ref 0.1–1.0)
Monocytes Relative: 12.4 % — ABNORMAL HIGH (ref 3.0–12.0)
Monocytes Relative: 13 %
Neutro Abs: 5.5 K/uL (ref 1.4–7.7)
Neutro Abs: 6.8 K/uL (ref 1.7–7.7)
Neutrophils Relative %: 62.9 % (ref 43.0–77.0)
Neutrophils Relative %: 64 %
Platelets: 234 K/uL (ref 150.0–400.0)
Platelets: 252 K/uL (ref 150–400)
RBC: 4.68 Mil/uL (ref 4.22–5.81)
RBC: 4.71 MIL/uL (ref 4.22–5.81)
RDW: 16 % — ABNORMAL HIGH (ref 11.5–15.5)
RDW: 14.9 % (ref 11.5–15.5)
WBC: 8.8 K/uL (ref 4.0–10.5)
WBC: 10.7 K/uL — ABNORMAL HIGH (ref 4.0–10.5)
nRBC: 0 % (ref 0.0–0.2)

## 2024-07-08 LAB — CBG MONITORING, ED: Glucose-Capillary: 272 mg/dL — ABNORMAL HIGH (ref 70–99)

## 2024-07-08 LAB — COMPREHENSIVE METABOLIC PANEL WITH GFR
ALT: 29 U/L (ref 0–44)
AST: 31 U/L (ref 15–41)
Albumin: 3.4 g/dL — ABNORMAL LOW (ref 3.5–5.0)
Alkaline Phosphatase: 46 U/L (ref 38–126)
Anion gap: 11 (ref 5–15)
BUN: 14 mg/dL (ref 6–20)
CO2: 24 mmol/L (ref 22–32)
Calcium: 8.3 mg/dL — ABNORMAL LOW (ref 8.9–10.3)
Chloride: 101 mmol/L (ref 98–111)
Creatinine, Ser: 0.87 mg/dL (ref 0.61–1.24)
GFR, Estimated: >60 mL/min (ref >60)
Glucose, Bld: 233 mg/dL — ABNORMAL HIGH (ref 70–99)
Potassium: 4 mmol/L (ref 3.5–5.1)
Sodium: 136 mmol/L (ref 135–145)
Total Bilirubin: 0.7 mg/dL (ref 0.0–1.2)
Total Protein: 7.8 g/dL (ref 6.5–8.1)

## 2024-07-08 LAB — SEDIMENTATION RATE
Sed Rate: 43 mm/h — ABNORMAL HIGH (ref 0–20)
Sed Rate: 40 mm/h — ABNORMAL HIGH (ref 0–16)

## 2024-07-08 LAB — C-REACTIVE PROTEIN: CRP: 2.7 mg/dL (ref 0.5–20.0)

## 2024-07-08 LAB — LACTIC ACID, PLASMA
Lactic Acid, Venous: 1.9 mmol/L (ref 0.5–1.9)
Lactic Acid, Venous: 1.7 mmol/L (ref 0.5–1.9)

## 2024-07-08 MED ORDER — DOXYCYCLINE HYCLATE 100 MG PO TABS: 100.0000 mg | ORAL_TABLET | Freq: Two times a day (BID) | ORAL | 0 refills | Status: DC

## 2024-07-08 MED ORDER — INSULIN ASPART 100 UNIT/ML IJ SOLN
0.0000 [IU] | INTRAMUSCULAR | Status: DC
  Administered 2024-07-08: 8 [IU] via SUBCUTANEOUS
  Administered 2024-07-09 (×2): 3 [IU] via SUBCUTANEOUS
  Administered 2024-07-09: 8 [IU] via SUBCUTANEOUS
  Administered 2024-07-09: 2 [IU] via SUBCUTANEOUS
  Administered 2024-07-10 (×2): 3 [IU] via SUBCUTANEOUS
  Administered 2024-07-10: 2 [IU] via SUBCUTANEOUS
  Filled 2024-07-08: qty 3
  Filled 2024-07-08: qty 8
  Filled 2024-07-08 (×2): qty 2
  Filled 2024-07-08: qty 3
  Filled 2024-07-08: qty 8
  Filled 2024-07-08 (×2): qty 3

## 2024-07-08 MED ORDER — ATORVASTATIN CALCIUM 20 MG PO TABS
20.0000 mg | ORAL_TABLET | Freq: Every day | ORAL | Status: DC
  Administered 2024-07-08 – 2024-07-10 (×3): 20 mg via ORAL
  Filled 2024-07-08 (×3): qty 1

## 2024-07-08 MED ORDER — OXYCODONE HCL 5 MG PO TABS
5.0000 mg | ORAL_TABLET | ORAL | Status: DC | PRN
  Administered 2024-07-08 – 2024-07-09 (×3): 5 mg via ORAL
  Filled 2024-07-08 (×3): qty 1

## 2024-07-08 MED ORDER — DULOXETINE HCL 30 MG PO CPEP
60.0000 mg | ORAL_CAPSULE | Freq: Every day | ORAL | Status: DC
  Administered 2024-07-08 – 2024-07-10 (×3): 60 mg via ORAL
  Filled 2024-07-08: qty 1
  Filled 2024-07-08: qty 2
  Filled 2024-07-08: qty 1

## 2024-07-08 MED ORDER — IRBESARTAN 150 MG PO TABS
75.0000 mg | ORAL_TABLET | Freq: Every day | ORAL | Status: DC
  Administered 2024-07-08 – 2024-07-10 (×3): 75 mg via ORAL
  Filled 2024-07-08 (×4): qty 1

## 2024-07-08 MED ORDER — ONDANSETRON HCL 4 MG PO TABS: 4.0000 mg | ORAL_TABLET | Freq: Four times a day (QID) | ORAL | Status: DC | PRN

## 2024-07-08 MED ORDER — AMOXICILLIN-POT CLAVULANATE 875-125 MG PO TABS: 1.0000 | ORAL_TABLET | Freq: Two times a day (BID) | ORAL | 0 refills | Status: DC

## 2024-07-08 MED ORDER — ACETAMINOPHEN 650 MG RE SUPP
650.0000 mg | Freq: Four times a day (QID) | RECTAL | Status: DC | PRN
Start: 2024-07-08 — End: 2024-07-10

## 2024-07-08 MED ORDER — KETOROLAC TROMETHAMINE 30 MG/ML IJ SOLN
30.0000 mg | Freq: Four times a day (QID) | INTRAMUSCULAR | Status: DC | PRN
  Administered 2024-07-09: 30 mg via INTRAVENOUS
  Filled 2024-07-08: qty 1

## 2024-07-08 MED ORDER — ASPIRIN 81 MG PO TBEC
81.0000 mg | DELAYED_RELEASE_TABLET | Freq: Every day | ORAL | Status: DC
  Administered 2024-07-09 – 2024-07-10 (×2): 81 mg via ORAL
  Filled 2024-07-08 (×2): qty 1

## 2024-07-08 MED ORDER — ACETAMINOPHEN 325 MG PO TABS: 650.0000 mg | ORAL_TABLET | Freq: Four times a day (QID) | ORAL | Status: DC | PRN

## 2024-07-08 MED ORDER — VANCOMYCIN HCL 2000 MG/400ML IV SOLN
2000.0000 mg | Freq: Once | INTRAVENOUS | Status: AC
  Administered 2024-07-08: 2000 mg via INTRAVENOUS
  Filled 2024-07-08: qty 400

## 2024-07-08 MED ORDER — PIPERACILLIN-TAZOBACTAM 3.375 G IVPB
3.3750 g | Freq: Three times a day (TID) | INTRAVENOUS | Status: DC
  Administered 2024-07-09 – 2024-07-10 (×5): 3.375 g via INTRAVENOUS
  Filled 2024-07-08 (×4): qty 50

## 2024-07-08 MED ORDER — MORPHINE SULFATE (PF) 2 MG/ML IV SOLN: 2.0000 mg | INTRAVENOUS | Status: DC | PRN

## 2024-07-08 MED ORDER — ONDANSETRON HCL 4 MG/2ML IJ SOLN
4.0000 mg | Freq: Four times a day (QID) | INTRAMUSCULAR | Status: DC | PRN
Start: 2024-07-08 — End: 2024-07-10

## 2024-07-08 MED ORDER — PIPERACILLIN-TAZOBACTAM 3.375 G IVPB 30 MIN
3.3750 g | Freq: Once | INTRAVENOUS | Status: AC
  Administered 2024-07-08: 3.375 g via INTRAVENOUS
  Filled 2024-07-08 (×2): qty 50

## 2024-07-08 MED ORDER — VANCOMYCIN HCL 1500 MG/300ML IV SOLN
1500.0000 mg | Freq: Two times a day (BID) | INTRAVENOUS | Status: DC
  Administered 2024-07-09 – 2024-07-10 (×3): 1500 mg via INTRAVENOUS
  Filled 2024-07-08 (×4): qty 300

## 2024-07-08 MED ORDER — GADOBUTROL 1 MMOL/ML IV SOLN
10.0000 mL | Freq: Once | INTRAVENOUS | Status: AC | PRN
  Administered 2024-07-09: 10 mL via INTRAVENOUS

## 2024-07-08 NOTE — Assessment & Plan Note (Addendum)
 Sliding scale insulin  coverage while n.p.o. Continue aspirin  and atorvastatin 

## 2024-07-08 NOTE — Progress Notes (Signed)
 Established Patient Office Visit  Subjective:      CC:  Chief Complaint  Patient presents with   Acute Visit    R calf swelling x1 month    HPI: [name] is a 58 y.o. male presenting on 07/08/2024 for Acute Visit (R calf swelling x1 month) .  Discussed the use of AI scribe software for clinical note transcription with the patient, who gave verbal consent to proceed.  History of Present Illness [name] is a 58 year old male with neuropathy and a history of foot ulcers who presents with leg swelling and concerns about a foot ulcer.  He has experienced swelling in his legs for the past couple of weeks, which is unusual as he typically experiences swelling only in his calf. The swelling has persisted and feels tight, especially noticeable when wearing a brace. He describes the sensation as 'tightening in different places' when the shoe and brace are not on.  He has a history of neuropathy, which he states is genetic and not related to his diabetes. He has had all the toes on one side removed, but this was not due to circulatory issues. He also reports having ulcers. He wears a brace and has foot drop.  There is a family history of diabetes, neuropathy, and heart issues on his father's side. He is currently on baby aspirin  and has no history of blood clots. He uses a continuous glucose monitor and notes fluctuations in his blood sugar levels.  He has a chronic ulcer on the bottom of his foot, which he manages himself. The ulcer leaks, but not yellow pus, and it has a bad smell. He has been hospitalized every six to nine months due to infections in the ulcer, where he receives antibiotics. He has previously been on Augmentin  and doxycycline  for infections.  No current fever, but when the ulcer gets infected, he experiences chills and feels unwell, prompting him to seek medical attention. He has seen infectious disease specialists in the past for these issues but not  recently.          Social history:  Relevant past medical, surgical, family and social history reviewed and updated as indicated. Interim medical history since our last visit reviewed.  Allergies and medications reviewed and updated.  DATA REVIEWED: CHART IN EPIC     ROS: Negative unless specifically indicated above in HPI.    Current Outpatient Medications:    amoxicillin -clavulanate (AUGMENTIN ) 875-125 MG tablet, Take 1 tablet by mouth 2 (two) times daily., Disp: 20 tablet, Rfl: 0   aspirin  EC 81 MG tablet, Take 81 mg by mouth daily. Swallow whole., Disp: , Rfl:    atorvastatin  (LIPITOR) 20 MG tablet, Take 1 tablet (20 mg total) by mouth daily., Disp: 90 tablet, Rfl: 3   doxycycline  (VIBRA -TABS) 100 MG tablet, Take 1 tablet (100 mg total) by mouth 2 (two) times daily for 10 days., Disp: 20 tablet, Rfl: 0   DULoxetine  (CYMBALTA ) 60 MG capsule, TAKE 1 CAPSULE BY MOUTH EVERY DAY, Disp: 90 capsule, Rfl: 2   insulin  glargine, 2 Unit Dial, (TOUJEO  MAX SOLOSTAR) 300 UNIT/ML Solostar Pen, Inject 20 Units into the skin daily., Disp: 15 mL, Rfl: 1   Insulin  Pen Needle (PEN NEEDLES) 32G X 6 MM MISC, Use to give insulin  once daily., Disp: 100 each, Rfl: 3   metFORMIN  (GLUCOPHAGE -XR) 500 MG 24 hr tablet, TAKE 1 TABLET BY MOUTH EVERY DAY WITH BREAKFAST, Disp: 90 tablet, Rfl: 1  Multiple Vitamins-Minerals (MULTIVITAMIN WITH MINERALS) tablet, Take 1 tablet by mouth daily., Disp: , Rfl:    oxyCODONE  (ROXICODONE ) 5 MG immediate release tablet, Take 1.5 tablets (7.5 mg total) by mouth daily. As needed for severe pain, Disp: 45 tablet, Rfl: 0   oxyCODONE  (ROXICODONE ) 5 MG immediate release tablet, Take 1.5 tablets (7.5 mg total) by mouth daily. As needed for severe pain, Disp: 45 tablet, Rfl: 0   oxyCODONE  (ROXICODONE ) 5 MG immediate release tablet, Take 1.5 tablets (7.5 mg total) by mouth daily. As needed for severe pain, Disp: 45 tablet, Rfl: 0   SODIUM FLUORIDE 5000 SENSITIVE 1.1-5 % GEL,  Brush as directed by your provider., Disp: , Rfl:    tamsulosin  (FLOMAX ) 0.4 MG CAPS capsule, Take 1 capsule (0.4 mg total) by mouth daily., Disp: 30 capsule, Rfl: 0   telmisartan  (MICARDIS ) 20 MG tablet, Take 1 tablet (20 mg total) by mouth daily. TAKE 1 TABLET BY MOUTH EVERYDAY AT BEDTIME, Disp: 90 tablet, Rfl: 1        Objective:        BP 130/74 (BP Location: Left Arm, Patient Position: Sitting, Cuff Size: Large)   Pulse 80   Temp 98.7 F (37.1 C) (Temporal)   Ht 5' 9 (1.753 m)   Wt 232 lb 6.4 oz (105.4 kg)   SpO2 98%   BMI 34.32 kg/m   Physical Exam EXTREMITIES: Right leg swollen, tight, and slightly tender on palpation. Right foot red, hot to touch, malodorous, indicating infection. Pulse in right leg difficult to palpate due to tightness.  Wt Readings from Last 3 Encounters:  07/08/24 232 lb 6.4 oz (105.4 kg)  06/16/24 230 lb (104.3 kg)  06/11/24 232 lb 3.2 oz (105.3 kg)    Physical Exam Vitals reviewed.  Feet:     Comments: Significant ulceration left foot pad with fat layer exposed, dry calloused skin outer border, yellow discharge present, foul smelling, erythema extending from base of right foot extending mid shin with 2+ pitting edema. Pulses limited due to swelling    .       Results Procedure: Wound culture Description: Culture obtained from the ulcer on the plantar surface of the foot, noted to be erythematous, warm, and emitting a malodorous smell, indicating infection.  Assessment & Plan:   Assessment and Plan Assessment & Plan Chronic infected ulcer of left foot with cellulitis Chronic infected ulcer on the left foot with signs of cellulitis, including redness, warmth, and a putrid odor. The ulcer is previously recommended for amputation. There is concern for infection progression, potentially requiring hospitalization and IV antibiotics. He has a history of recurrent infections and previous antibiotic treatments, including clindamycin and  Biaxin. - Ordered wound culture, pending -recommendation for ER pt declines at this time. Strict ER precautions advised - rx augmentin  875/125 mg po bid x 10 days -RX doxycycline  100 mg po bid x 10 days - Referred to infectious disease specialist for evaluation this week/ASAP - Advised to monitor for fever, increased redness, or worsening infection and seek hospital care if these occur - Encouraged follow-up with Dr. Tulo in 2-3 days  Swelling and pain of left lower leg Swelling and tightness in the left lower leg for a couple of weeks. Differential includes possible deep vein thrombosis (DVT) due to swelling and tightness. No history of blood clots, but he is on baby aspirin . - Ordered ultrasound of the left leg to rule out DVT, stat  - Ordered CBC to check for elevated white blood  cells -pt on daily aspirin , advised to continue -ER precautions advised  Foot drop, left foot Foot drop on the left foot, managed with a brace. The condition is related to previous toe removal and neuropathy. - Continue use of brace for foot drop management        Return in about 3 days (around 07/11/2024) for follow up with pcp for wound.     Ginger Patrick, MSN, APRN, FNP-C  [facility]

## 2024-07-08 NOTE — Patient Instructions (Addendum)
 I have ordered imaging for you at [facility] outpatient diagnostic center. This order has been sent over for you electronically.   Please call [phone] to schedule this appointment.  ------------------------------------  Call infectious disease to schedule appointment ASAP    ------------------------------------

## 2024-07-08 NOTE — Assessment & Plan Note (Signed)
Continue telmisartan 

## 2024-07-08 NOTE — H&P (Signed)
 History and Physical    Patient: [name] FMW:[id number] DOB: July 29, 1966 DOA: 07/08/2024 DOS: the patient was seen and examined on 07/08/2024 PCP: Marylynn Verneita CROME, MD  Patient coming from: Home  Chief Complaint:  Chief Complaint  Patient presents with   Wound Infection    HPI: [name] is a 58 y.o. 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  He was sent from clinic after he was noted to have worsening pain and swelling at the site of the plantar wound with extension of the swelling up the calf.  He has had no fever or chills. In the ED afebrile, pulse 100, BP 154/79 WBC 10.7 with lactic acid 1.7 and sed rate 40 Blood glucose 233 CBC and CMP otherwise unremarkable Right foot x-ray showed diffuse soft tissue edema of the foot.  Subcutaneous gas possibly related to large ulceration.  No underlying osteomyelitis.  The ED provider spoke with podiatrist, Dr. Greig Blush who recommended getting MRI and admitting for possible debridement in the AM.  Patient started on Zosyn and vancomycin   Admission requested.     Past Medical History:  Diagnosis Date   Acute prostatitis without hematuria 05/20/2022   Allergy    Anxiety    Atypical pneumonia 12/18/2022   Complication of anesthesia    pt states at [facility] he stopped breathing due to his sleep apnea-hard to wake up   COVID-19    Depression    Diabetic ulcer of both feet (HCC)    DM (diabetes mellitus), type 2 (HCC)    History of kidney stones    History of methicillin resistant staphylococcus aureus (MRSA) 09/2020   foot   Neuromuscular disorder (HCC)    neuropathy of back and bilateral feet   Obesity    Sleep apnea    does not use cpap   Past Surgical History:  Procedure Laterality Date   AMPUTATION Left    toe amputation    CYSTOSCOPY/URETEROSCOPY/HOLMIUM LASER/STENT PLACEMENT Right 07/29/2022   Procedure: CYSTOSCOPY/URETEROSCOPY/HOLMIUM LASER/STENT PLACEMENT;  Surgeon: Francisca Redell JAYSON, MD;  Location: [facility];  Service: Urology;  Laterality: Right;   EXTRACORPOREAL SHOCK WAVE LITHOTRIPSY Left 02/08/2024   Procedure: LITHOTRIPSY, ESWL;  Surgeon: Twylla Glendia JAYSON, MD;  Location: [facility];  Service: Urology;  Laterality: Left;   EXTRACORPOREAL SHOCK WAVE LITHOTRIPSY Left 02/15/2024   Procedure: LITHOTRIPSY, ESWL;  Surgeon: Penne Knee, MD;  Location: [facility];  Service: Urology;  Laterality: Left;   FOOT SURGERY Right 09/2012   cyst removed   LOWER EXTREMITY ANGIOGRAPHY Right 07/12/2023   Procedure: Lower Extremity Angiography;  Surgeon: Marea Selinda RAMAN, MD;  Location: [facility];  Service: Cardiovascular;  Laterality: Right;   SPINE SURGERY  2004   nerve damage in spine   TOE AMPUTATION Right 06/30/2016   5 toes   Social History:  reports that he has never smoked. He has never been exposed to tobacco smoke. He has never used smokeless tobacco. He reports that he does not drink alcohol and does not use drugs.  No Known Allergies  Family History  Problem Relation Age of Onset   Heart disease Father    Stroke Father    Heart attack Father 100   Diabetes Father    Heart disease Maternal Grandmother    Stroke Maternal Grandmother    Heart disease Paternal Grandfather  Stroke Paternal Grandfather     Prior to Admission medications   Medication Sig Start Date End Date Taking? Authorizing Provider  amoxicillin -clavulanate (AUGMENTIN ) 875-125 MG tablet Take 1 tablet by mouth 2 (two) times daily. 07/08/24   Corwin Antu, FNP  aspirin  EC 81 MG tablet Take 81 mg by mouth daily. Swallow whole.    [provider]  atorvastatin  (LIPITOR) 20 MG tablet Take 1 tablet (20 mg total) by mouth daily. 06/11/24   Marylynn Verneita CROME, MD  doxycycline  (VIBRA -TABS) 100 MG tablet Take 1 tablet (100 mg total)  by mouth 2 (two) times daily for 10 days. 07/08/24 07/18/24  Dugal, Tabitha, FNP  DULoxetine  (CYMBALTA ) 60 MG capsule TAKE 1 CAPSULE BY MOUTH EVERY DAY 02/05/24   Marylynn Verneita CROME, MD  insulin  glargine, 2 Unit Dial, (TOUJEO  MAX SOLOSTAR) 300 UNIT/ML Solostar Pen Inject 20 Units into the skin daily. 06/11/24   Marylynn Verneita CROME, MD  Insulin  Pen Needle (PEN NEEDLES) 32G X 6 MM MISC Use to give insulin  once daily. 12/05/23   Marylynn Verneita CROME, MD  metFORMIN  (GLUCOPHAGE -XR) 500 MG 24 hr tablet TAKE 1 TABLET BY MOUTH EVERY DAY WITH BREAKFAST 06/11/24   Tullo, Teresa L, MD  Multiple Vitamins-Minerals (MULTIVITAMIN WITH MINERALS) tablet Take 1 tablet by mouth daily.    [provider]  oxyCODONE  (ROXICODONE ) 5 MG immediate release tablet Take 1.5 tablets (7.5 mg total) by mouth daily. As needed for severe pain 06/11/24   Marylynn Verneita CROME, MD  oxyCODONE  (ROXICODONE ) 5 MG immediate release tablet Take 1.5 tablets (7.5 mg total) by mouth daily. As needed for severe pain 06/11/24   Marylynn Verneita CROME, MD  oxyCODONE  (ROXICODONE ) 5 MG immediate release tablet Take 1.5 tablets (7.5 mg total) by mouth daily. As needed for severe pain 06/11/24   Marylynn Verneita CROME, MD  SODIUM FLUORIDE 5000 SENSITIVE 1.1-5 % GEL Brush as directed by your provider. 06/20/23   [provider]  tamsulosin  (FLOMAX ) 0.4 MG CAPS capsule Take 1 capsule (0.4 mg total) by mouth daily. 05/03/24   Vaillancourt, Samantha, PA-C  telmisartan  (MICARDIS ) 20 MG tablet Take 1 tablet (20 mg total) by mouth daily. TAKE 1 TABLET BY MOUTH EVERYDAY AT BEDTIME 06/11/24   Marylynn Verneita CROME, MD    Physical Exam: Vitals:   07/08/24 1658  BP: (!) 154/79  Pulse: 100  Resp: 18  Temp: 97.9 F (36.6 C)  TempSrc: Oral  SpO2: 98%   Physical Exam Vitals and nursing note reviewed.  Constitutional:      General: He is not in acute distress. HENT:     Head: Normocephalic and atraumatic.  Cardiovascular:     Rate and Rhythm: Normal rate and regular rhythm.      Heart sounds: Normal heart sounds.  Pulmonary:     Effort: Pulmonary effort is normal.     Breath sounds: Normal breath sounds.  Abdominal:     Palpations: Abdomen is soft.     Tenderness: There is no abdominal tenderness.  Musculoskeletal:     Comments: Bandage to right TMA stump.  Mild erythema noted up lateral aspect of right lower leg. Right lower leg circumference> left which patient states is normal   Neurological:     Mental Status: Mental status is at baseline.     Labs on Admission: I have personally reviewed following labs and imaging studies  CBC: Recent Labs  Lab 07/08/24 0941 07/08/24 1702  WBC 8.8 10.7*  NEUTROABS 5.5 6.8  HGB 13.3 13.4  HCT 40.5 40.8  MCV 86.5 86.6  PLT 234.0 252   Basic Metabolic Panel: Recent Labs  Lab 07/08/24 1702  NA 136  K 4.0  CL 101  CO2 24  GLUCOSE 233*  BUN 14  CREATININE 0.87  CALCIUM  8.3*   GFR: Estimated Creatinine Clearance: 109.4 mL/min (by C-G formula based on SCr of 0.87 mg/dL). Liver Function Tests: Recent Labs  Lab 07/08/24 1702  AST 31  ALT 29  ALKPHOS 46  BILITOT 0.7  PROT 7.8  ALBUMIN 3.4*   No results for input(s): LIPASE, AMYLASE in the last 168 hours. No results for input(s): AMMONIA in the last 168 hours. Coagulation Profile: No results for input(s): INR, PROTIME in the last 168 hours. Cardiac Enzymes: No results for input(s): CKTOTAL, CKMB, CKMBINDEX, TROPONINI in the last 168 hours. BNP (last 3 results) No results for input(s): PROBNP in the last 8760 hours. HbA1C: No results for input(s): HGBA1C in the last 72 hours. CBG: No results for input(s): GLUCAP in the last 168 hours. Lipid Profile: No results for input(s): CHOL, HDL, LDLCALC, TRIG, CHOLHDL, LDLDIRECT in the last 72 hours. Thyroid  Function Tests: No results for input(s): TSH, T4TOTAL, FREET4, T3FREE, THYROIDAB in the last 72 hours. Anemia Panel: No results for input(s):  VITAMINB12, FOLATE, FERRITIN, TIBC, IRON, RETICCTPCT in the last 72 hours. Urine analysis:    Component Value Date/Time   COLORURINE YELLOW (A) 03/30/2024 1247   APPEARANCEUR CLEAR (A) 03/30/2024 1247   APPEARANCEUR Clear 02/01/2024 1417   LABSPEC 1.013 03/30/2024 1247   PHURINE 5.0 03/30/2024 1247   GLUCOSEU NEGATIVE 03/30/2024 1247   GLUCOSEU NEGATIVE 09/21/2022 0736   HGBUR SMALL (A) 03/30/2024 1247   BILIRUBINUR NEGATIVE 03/30/2024 1247   BILIRUBINUR Negative 02/01/2024 1417   KETONESUR NEGATIVE 03/30/2024 1247   PROTEINUR NEGATIVE 03/30/2024 1247   UROBILINOGEN 0.2 09/21/2022 0736   NITRITE NEGATIVE 03/30/2024 1247   LEUKOCYTESUR NEGATIVE 03/30/2024 1247    Radiological Exams on Admission: DG Foot Complete Right Result Date: 07/08/2024 EXAM: 3 OR MORE VIEW(S) XRAY OF THE RIGHT FOOT 07/08/2024 06:24:20 PM COMPARISON: 02/14/2024 CLINICAL HISTORY: Diabetic foot ulcer, evaluate for evidence of underlying osteomyelitis. 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 Diffuse osteopenia. Moderate midfoot osteoarthritis unchanged. No acute fracture. No focal osseous lesion. No joint dislocation. SOFT TISSUES: Diffuse soft tissue edema throughout the foot with possible subcutaneous gas on the AP and oblique views. IMPRESSION: 1. Diffuse soft tissue edema throughout the foot with possible subcutaneous gas on the AP and oblique views. This could represent subcutaneous gas from a gas-producing infection or artifact related to large ulceration. Correlation with physical exam findings requested. 2. No radiographic findings to suggest underlying osteomyelitis. Electronically signed by: Rogelia Myers MD 07/08/2024 06:49 PM EST RP Workstation: [id number]   US  Venous Img Lower Unilateral Right (DVT) Result Date: 07/08/2024 CLINICAL DATA:  58 year old male with right lower extremity pain and swelling. EXAM: RIGHT LOWER EXTREMITY VENOUS DOPPLER  ULTRASOUND TECHNIQUE: Gray-scale sonography with graded compression, as well as color Doppler and duplex ultrasound were performed to evaluate the right lower extremity deep venous systems from the level of the common femoral vein and including the common femoral, femoral, profunda femoral, popliteal and calf veins including the posterior tibial, peroneal and gastrocnemius veins when visible. Spectral Doppler was utilized to evaluate flow at rest and with distal augmentation maneuvers in the common femoral, femoral and popliteal veins. The contralateral common femoral vein was also evaluated for comparison. COMPARISON:  02/15/2024 FINDINGS:  RIGHT LOWER EXTREMITY Common Femoral Vein: No evidence of thrombus. Normal compressibility, respiratory phasicity and response to augmentation. Central Greater Saphenous Vein: No evidence of thrombus. Normal compressibility and flow on color Doppler imaging. Central Profunda Femoral Vein: No evidence of thrombus. Normal compressibility and flow on color Doppler imaging. Femoral Vein: No evidence of thrombus. Normal compressibility, respiratory phasicity and response to augmentation. Popliteal Vein: No evidence of thrombus. Normal compressibility, respiratory phasicity and response to augmentation. Calf Veins: No evidence of thrombus. Normal compressibility and flow on color Doppler imaging. Other Findings:  None. LEFT LOWER EXTREMITY Common Femoral Vein: No evidence of thrombus. Normal compressibility, respiratory phasicity and response to augmentation. IMPRESSION: No evidence of right lower extremity deep venous thrombosis. Ester Sides, MD [facility] Electronically Signed   By: Ester Sides M.D.   On: 07/08/2024 16:18   Data Reviewed for HPI: Relevant notes from primary care and specialist visits, past discharge summaries as available in EHR, including Care Everywhere. 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  Formal consult placed N.p.o. from midnight  Uncontrolled type 2 diabetes mellitus with hyperglycemia, with long-term current use of insulin  (HCC) Sliding scale insulin  coverage while n.p.o. Continue aspirin  and atorvastatin   Essential hypertension Continue telmisartan     DVT prophylaxis: SCD  Consults: podiatry Dr. Greig Blush, [facility] clinic  Advance Care Planning:   Code Status: Prior   Family Communication: none  Disposition Plan: Back to previous home environment  Severity of Illness: The appropriate patient status for this patient is OBSERVATION. Observation status is judged to be reasonable and necessary in order to provide the required intensity of service to ensure the patient's safety. The patient's presenting symptoms, physical exam findings, and initial radiographic and laboratory data in the context of their medical condition is felt to place them at decreased risk for further clinical deterioration. Furthermore, it is anticipated that the patient will be medically stable for discharge from the hospital within 2 midnights of admission.   Author: Delayne LULLA Solian, MD 07/08/2024 9:30 PM  For on call review [email or web address].

## 2024-07-08 NOTE — Assessment & Plan Note (Signed)
 Cellulitis right lower extremity History of right TMA MRI with a mild marrow edema likely reactive, early osteomyelitis cannot be ruled out.  Preliminary blood cultures negative and pending wound cultures which were taken at podiatrist office. Going for wound debridement later today Continue Zosyn vancomycin  Pain control

## 2024-07-08 NOTE — Telephone Encounter (Signed)
 Printed pt's dexcom data and placed in yellow results folder. Pt also picked up his mounjaro sample and dexcom sensor.    Medication Samples have been provided to the patient.  Drug name: Mounjaro       Strength: 2.5 mg        Qty: 1 box  LOT: [id number]  Exp.Date: 01/30//2027  Dosing instructions: Inject 2.5 mg into skin once weekly.   The patient has been instructed regarding the correct time, dose, and frequency of taking this medication, including desired effects and most common side effects.   Hien Perreira 2:13 PM 07/08/2024

## 2024-07-08 NOTE — Consult Note (Signed)
 Pharmacy Antibiotic Note  [name] is a 58 y.o. male admitted on 07/08/2024 with ulcer on plantar surface of foot.  Pharmacy has been consulted for Vancomycin  and Zosyn dosing.  Plan: Vancomycin  2000 mg IV x 1 as loading dose, followed by: 1500mg  Q 12 hrs. Goal AUC 400-550. Expected AUC: 492.4 Expected Cmin: 13.8 SCr used: 0.87, Vd used: 0.72  Zosyn 3.375g IV q8h (4 hour infusion).     Temp (24hrs), Avg:98.3 F (36.8 C), Min:97.9 F (36.6 C), Max:98.7 F (37.1 C)  Recent Labs  Lab 07/08/24 0941 07/08/24 1702 07/08/24 1930  WBC 8.8 10.7*  --   CREATININE  --  0.87  --   LATICACIDVEN  --  1.9 1.7    Estimated Creatinine Clearance: 109.4 mL/min (by C-G formula based on SCr of 0.87 mg/dL).    No Known Allergies  Antimicrobials this admission: Vancomycin  11/10 >>  Zosyn 11/10 >>   Dose adjustments this admission: N/A  Microbiology results: 11/10 BCx: collected   Thank you for allowing pharmacy to be a part of this patient's care.  Ixel Boehning A Delayne Sanzo 07/08/2024 9:51 PM

## 2024-07-08 NOTE — ED Provider Notes (Signed)
 [facility] Provider Note    Event Date/Time   First MD Initiated Contact with Patient 07/08/24 1752     (approximate)   History   Wound Infection  Pt to ED via POV from home. Pt reports infection in his right foot that has been getting worse. Pt reports blood work was done and doctor determined he needed to come in for IV antibiotics. Pt with hx DM.   Pt had US  today and negative for DVT    HPI [name] is a 58 y.o. 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  Has neuropathy and does not have any sensation in his feet at baseline.  No fevers or chills. [place]EN[place] Sous to clinic today, was told to come to emergency department for eval of his right foot infection  Per chart review, patient was seen in his family medicine clinic earlier today.  Noted to have increased redness, warmth, malodor coming from chronic wound on RIGHT foot.  Had reportedly previously been recommended for amputation due to this ulcer.  Expressed concern for infection progression.  Wound culture collected.  Recommended ED.  Complex Augmentin  and doxycycline  and referred to infectious disease.  Also notes pain and swelling of RIGHT lower leg, performed DVT ultrasound which was negative.  Labs this morning with elevated sed rate at 43, CRP normal.  CBC unremarkable.   Physical Exam   Triage Vital Signs: ED Triage Vitals  Encounter Vitals Group     BP 07/08/24 1658 (!) 154/79     Girls Systolic BP Percentile --      Girls Diastolic BP Percentile --      Boys Systolic BP Percentile --      Boys Diastolic BP Percentile --      Pulse Rate 07/08/24 1658 100     Resp 07/08/24 1658 18     Temp 07/08/24 1658 97.9 F (36.6 C)     Temp Source 07/08/24 1658 Oral     SpO2  07/08/24 1658 98 %     Weight --      Height --      Head Circumference --      Peak Flow --      Pain Score 07/08/24 1656 0     Pain Loc --      Pain Education --      Exclude from Growth Chart --     Most recent vital signs: Vitals:   07/08/24 2208 07/08/24 2256  BP: 137/72 138/66  Pulse:  86  Resp:  16  Temp:  98.2 F (36.8 C)  SpO2:  99%     General: Awake, no distress.  CV:  Good peripheral perfusion. RRR, RP 2+ Resp:  Normal effort. CTAB Abd:  No distention. Nontender to deep palpation throughout RLE:   Status post partial foot amputation.  Large circular ulcer partial-thickness to sole of mid/forefoot, no gross purulence, some surrounding erythema.  Diffuse swelling throughout foot extending up her shin.  No fluctuance.  Mild warmth.  Very foul odor. LLE:   Foot exam unremarkable   ED Results / Procedures / Treatments   Labs (all labs ordered are listed, but only abnormal results are displayed) Labs Reviewed  COMPREHENSIVE METABOLIC PANEL WITH GFR - Abnormal; Notable for the  following components:      Result Value   Glucose, Bld 233 (*)    Calcium  8.3 (*)    Albumin 3.4 (*)    All other components within normal limits  CBC WITH DIFFERENTIAL/PLATELET - Abnormal; Notable for the following components:   WBC 10.7 (*)    Monocytes Absolute 1.4 (*)    Eosinophils Absolute 0.8 (*)    Abs Immature Granulocytes 0.08 (*)    All other components within normal limits  SEDIMENTATION RATE - Abnormal; Notable for the following components:   Sed Rate 40 (*)    All other components within normal limits  CBG MONITORING, ED - Abnormal; Notable for the following components:   Glucose-Capillary 272 (*)    All other components within normal limits  CULTURE, BLOOD (ROUTINE X 2)  CULTURE, BLOOD (ROUTINE X 2)  LACTIC ACID, PLASMA  LACTIC ACID, PLASMA  C-REACTIVE PROTEIN     EKG  N/a   RADIOLOGY Radiology interpreted by myself and radiology report reviewed.  Some free  air that I suspect is due to known ulcer as opposed to gas-forming bacteria.  No obvious fractures.  No clear evidence of osteomyelitis.    PROCEDURES:  Critical Care performed: No  Procedures   MEDICATIONS ORDERED IN ED: Medications  aspirin  EC tablet 81 mg (has no administration in time range)  irbesartan  (AVAPRO ) tablet 75 mg (75 mg Oral Given 07/08/24 2205)  atorvastatin  (LIPITOR) tablet 20 mg (20 mg Oral Given 07/08/24 2205)  DULoxetine  (CYMBALTA ) DR capsule 60 mg (60 mg Oral Given 07/08/24 2205)  acetaminophen  (TYLENOL ) tablet 650 mg (has no administration in time range)    Or  acetaminophen  (TYLENOL ) suppository 650 mg (has no administration in time range)  ondansetron  (ZOFRAN ) tablet 4 mg (has no administration in time range)    Or  ondansetron  (ZOFRAN ) injection 4 mg (has no administration in time range)  insulin  aspart (novoLOG ) injection 0-15 Units (8 Units Subcutaneous Given 07/08/24 2206)  oxyCODONE  (Oxy IR/ROXICODONE ) immediate release tablet 5 mg (5 mg Oral Given 07/08/24 2209)  ketorolac  (TORADOL ) 30 MG/ML injection 30 mg (has no administration in time range)  morphine  (PF) 2 MG/ML injection 2 mg (has no administration in time range)  piperacillin-tazobactam (ZOSYN) IVPB 3.375 g (has no administration in time range)  vancomycin  (VANCOREADY) IVPB 1500 mg/300 mL (has no administration in time range)  piperacillin-tazobactam (ZOSYN) IVPB 3.375 g (0 g Intravenous Stopped 07/08/24 2004)  vancomycin  (VANCOREADY) IVPB 2000 mg/400 mL (0 mg Intravenous Stopped 07/08/24 2238)     IMPRESSION / MDM / ASSESSMENT AND PLAN / ED COURSE  I reviewed the triage vital signs and the nursing notes.                              DDX/MDM/AP: Differential diagnosis includes, but is not limited to, diabetic foot ulcer, clinically suspect systemic infection at this time though consider underlying osteomyelitis.  Not clinically concern for abscess or necrotizing fasciitis at this time  given no clear fluctuance on exam and subacute development over the past couple weeks and very well-appearing patient.  Plan: - Labs - X-ray right foot - Anticipate need for IV antibiotics - Wound culture already collected - Will discuss with podiatry, low threshold for admission  Patient's presentation is most consistent with acute presentation with potential threat to life or bodily function.  The patient is on the cardiac monitor to evaluate for evidence of arrhythmia and/or significant  heart rate changes.  ED course below.  Laboratory workup with mild leukocytosis and elevated sed rate.  Did discuss with podiatrist who agreed with admission and will evaluate patient in the morning to discuss further management and possible surgical intervention.  Admitted to hospitalist service.  Treating with Vanco, Zosyn.  Clinical Course as of 07/08/24 2308  Mon Jul 08, 2024  1758 CBC with mild leukocytosis  CMP notable only for hyperglycemia, no evidence of DKA [MM]  1759 Lactate 1.9 [MM]  1830 Per chart review, last seen in podiatry clinic [facility], Dr. Neill) on 08/17/2023.  Noted to have chronic osteomyelitis of right ankle and foot.  There had been discussion of below-knee amputation though patient was not amenable at that time. [MM]  [phone] Paging podiatry [MM]  [phone] D/w Dr. Tanda - agrees w/ IV abx - will see in AM - recs MRI [MM]  1856 Hospitalist consult order placed [MM]    Clinical Course User Index [MM] Clarine Ozell LABOR, MD     FINAL CLINICAL IMPRESSION(S) / ED DIAGNOSES   Final diagnoses:  Diabetic ulcer of right midfoot associated with type 2 diabetes mellitus, unspecified ulcer stage (HCC)  Cellulitis and abscess of toe of right foot     Rx / DC Orders   ED Discharge Orders     None        Note:  This document was prepared using Dragon voice recognition software and may include unintentional dictation errors.   Clarine Ozell LABOR, MD 07/08/24 2308

## 2024-07-08 NOTE — Consult Note (Signed)
 ED Pharmacy Antibiotic Sign Off An antibiotic consult was received from an ED provider for Vancomycin  per pharmacy dosing for diabetic foot infection. A chart review was completed to assess appropriateness.   The following one time order(s) were placed:  Vancomycin  2g IV.  Further antibiotic and/or antibiotic pharmacy consults should be ordered by the admitting provider if indicated.   Thank you for allowing pharmacy to be a part of this patient's care.   Sherice Ijames A Taysha Majewski, [facility]  Clinical Pharmacist 07/08/24 6:59 PM

## 2024-07-08 NOTE — Telephone Encounter (Signed)
 Patient dropped off his glucose sensor at 7/10 am,to have readings uploaded. Patient will pick up medication and sensors when he comes back for his device. Please see Darice for patient's device. Patient was also advised of the pick times for medication.

## 2024-07-08 NOTE — ED Triage Notes (Addendum)
 Pt to ED via POV from home. Pt reports infection in his right foot that has been getting worse. Pt reports blood work was done and doctor determined he needed to come in for IV antibiotics. Pt with hx DM.   Pt had US  today and negative for DVT

## 2024-07-08 NOTE — Consult Note (Addendum)
 PODIATRIC SURGERY: CONSULT NOTE  Consulting Physician: Dr. Clarine   Reason for Consult: DFI cellulitis, in setting of RIGHT foot chronic ulceration.   HPI: [name] is a 58 y.o. male presenting to the ED for evaluation of wound/ infection concern to the RIGHT FOOT  x intermittently opening and closing over the past 14 years. [ x] Drainage.  How long: weeks - normal baseline.  [ x] Malodor. How long: n/a patient reports he was unable to tell the malodorous smell but provider at PCP inferred that one was present.  [ x] Redness. How long: 1 week  [ no] Pain. How long: N/A  Onset of wound: gradual / chronic  Course: stagnant size with worsening 2/2 infection.   Aggravating factors: ambulation / WB Prior Treatment: Extensive: Wound care center [facility] / surgical intervention / wound vacs / custom shoes / advanced wound care dressings etc.   Prior Wound Care instructions: Most recently, patient purchasing his own wound care supplies and requires minimal dressing to be able to fit into a shoe. patient was utilizing daily xeroform to wound base, coverage with gauze, Kerlix, + tape.   Podiatry consulted for evaluation of wound / infection LEFT foot.   Denies: F/C/N/V  SOB/CP/calf pain.  Last PO: last night 07/08/2024   PMHx:  Past Medical History:  Diagnosis Date   Acute prostatitis without hematuria 05/20/2022   Allergy    Anxiety    Atypical pneumonia 12/18/2022   Complication of anesthesia    pt states at [facility] he stopped breathing due to his sleep apnea-hard to wake up   COVID-19    Depression    Diabetic ulcer of both feet (HCC)    DM (diabetes mellitus), type 2 (HCC)    History of kidney stones    History of methicillin resistant staphylococcus aureus (MRSA) 09/2020   foot   Neuromuscular disorder (HCC)    neuropathy of back and bilateral feet   Obesity    Sleep apnea    does not use cpap    Surgical Hx:  Past Surgical History:  Procedure Laterality Date   AMPUTATION  Left    toe amputation   CYSTOSCOPY/URETEROSCOPY/HOLMIUM LASER/STENT PLACEMENT Right 07/29/2022   Procedure: CYSTOSCOPY/URETEROSCOPY/HOLMIUM LASER/STENT PLACEMENT;  Surgeon: Francisca Redell JAYSON, MD;  Location: [facility];  Service: Urology;  Laterality: Right;   EXTRACORPOREAL SHOCK WAVE LITHOTRIPSY Left 02/08/2024   Procedure: LITHOTRIPSY, ESWL;  Surgeon: Twylla Glendia JAYSON, MD;  Location: [facility];  Service: Urology;  Laterality: Left;   EXTRACORPOREAL SHOCK WAVE LITHOTRIPSY Left 02/15/2024   Procedure: LITHOTRIPSY, ESWL;  Surgeon: Penne Knee, MD;  Location: [facility];  Service: Urology;  Laterality: Left;   FOOT SURGERY Right 09/2012   cyst removed   LOWER EXTREMITY ANGIOGRAPHY Right 07/12/2023   Procedure: Lower Extremity Angiography;  Surgeon: Marea Selinda RAMAN, MD;  Location: [facility];  Service: Cardiovascular;  Laterality: Right;   SPINE SURGERY  2004   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 He has never been exposed to tobacco  smoke. He has never used smokeless tobacco. He reports that he does not drink alcohol and does not use drugs.  Allergies: No Known Allergies  (Not in a hospital admission)   Physical Exam: Blood pressure (!) 154/79, pulse 100, temperature 97.9 F (36.6 C), temperature source Oral, resp. rate 18, SpO2 98%.  Constitutional: Patient appears of normal development and good nutritional status for stated age, well-groomed, and of normal body habitus. Phonating appropriately.   Psychiatric: Alert and oriented to time, place, person and situation. The patient is noted to have good judgment and  insight into the hospital visit  FOCUSED LOWER EXTREMITY EXAMINATION: RIGHT FOOT   Neurological:  - Protective sensation absent - Gross protective sensation diminished  - No focal motor or sensory deficits identified bilaterally.  - Able to DF/Evert foot (prior h/o drop foot?)  Vascular:  - Dorsalis Pedis artery on palpation: 2 - Posterior Tibial artery on palpation: 1 - TMA flap refill time - WNL  - Peripheral edema: 1+ - Dependency Rubor: yes   Musculoskeletal:  S/p TMA  Muscle strength: 5/5 in all 4 quadrants  Ankle Joint ROM: decreased, equinus noted Global foot - TTP: none  Dermatological:  - Skin quality: atrophic  #WOUND 1   Type: Full thickness neuropathic ulceration --- Wound base: 10% Granular ; 80% Fibrosis ; 10% Necrotic  Adjacent tissue/ Wound borders: hyperkeratotic Undermining noted (Pre-debridement): none ; Direction: N/A  PTB: no Malodor: yes ; mild Active Drainage: no; If yes, consistency: n/a  Pre Debridement Measurements: 5.0cm (l) x 4.7cm (w) x 0.4cm (d)  Excisional debridement.  Post Debridement Measurements: 5.5cm (l) x 5.0cm (w) x 0.5cm (d)  Methods for Debridement: Sharp (#15/10 blade), Mechanical (Tissue nipper / Curette) Cautery needed(?): no  Debridement conducted without incident by above methods to granular wound base tissue with necrotic / fibrotic / biofilm excised by above described methods.        Results for orders placed or performed during the hospital encounter of 07/08/24 (from the past 48 hours)  Lactic acid, plasma     Status: None   Collection Time: 07/08/24  5:02 PM  Result Value Ref Range   Lactic Acid, Venous 1.9 0.5 - 1.9 mmol/L    Comment: Performed at [facility], [address] [place], [place] [address]  Comprehensive metabolic panel     Status: Abnormal   Collection Time: 07/08/24  5:02 PM  Result Value Ref Range   Sodium 136 135 - 145 mmol/L   Potassium 4.0 3.5 - 5.1 mmol/L   Chloride 101 98 -  111 mmol/L   CO2 24 22 - 32 mmol/L   Glucose, Bld 233 (H) 70 - 99 mg/dL    Comment: Glucose reference range applies only to samples taken after fasting for at least 8 hours.   BUN 14 6 - 20 mg/dL   Creatinine, Ser 9.12 0.61 - 1.24 mg/dL   Calcium  8.3 (L) 8.9 - 10.3 mg/dL   Total Protein 7.8 6.5 - 8.1 g/dL   Albumin 3.4 (L) 3.5 - 5.0 g/dL   AST 31 15 - 41 U/L   ALT 29 0 - 44 U/L   Alkaline Phosphatase 46 38 - 126 U/L   Total Bilirubin 0.7 0.0 - 1.2 mg/dL   GFR, Estimated >39 >39 mL/min    Comment: (NOTE) Calculated using the CKD-EPI Creatinine Equation (2021)    Anion gap 11 5 - 15    Comment: Performed at [facility], [address]  [place], [place] [address]  CBC with Differential     Status: Abnormal   Collection Time: 07/08/24  5:02 PM  Result Value Ref Range   WBC 10.7 (H) 4.0 - 10.5 K/uL   RBC 4.71 4.22 - 5.81 MIL/uL   Hemoglobin 13.4 13.0 - 17.0 g/dL   HCT 59.1 60.9 - 47.9 %   MCV 86.6 80.0 - 100.0 fL   MCH 28.5 26.0 - 34.0 pg   MCHC 32.8 30.0 - 36.0 g/dL   RDW 85.0 88.4 - 84.4 %   Platelets 252 150 - 400 K/uL   nRBC 0.0 0.0 - 0.2 %   Neutrophils Relative % 64 %   Neutro Abs 6.8 1.7 - 7.7 K/uL   Lymphocytes Relative 14 %   Lymphs Abs 1.5 0.7 - 4.0 K/uL   Monocytes Relative 13 %   Monocytes Absolute 1.4 (H) 0.1 - 1.0 K/uL   Eosinophils Relative 7 %   Eosinophils Absolute 0.8 (H) 0.0 - 0.5 K/uL   Basophils Relative 1 %   Basophils Absolute 0.1 0.0 - 0.1 K/uL   Immature Granulocytes 1 %   Abs Immature Granulocytes 0.08 (H) 0.00 - 0.07 K/uL    Comment: Performed at [facility], [address] [place], [place] [address]   DG Foot Complete Right Result Date: 07/08/2024 EXAM: 3 OR MORE VIEW(S) XRAY OF THE RIGHT FOOT 07/08/2024 06:24:20 PM COMPARISON: 02/14/2024 CLINICAL HISTORY: Diabetic foot ulcer, evaluate for evidence of underlying osteomyelitis. 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 Diffuse  osteopenia. Moderate midfoot osteoarthritis unchanged. No acute fracture. No focal osseous lesion. No joint dislocation. SOFT TISSUES: Diffuse soft tissue edema throughout the foot with possible subcutaneous gas on the AP and oblique views. IMPRESSION: 1. Diffuse soft tissue edema throughout the foot with possible subcutaneous gas on the AP and oblique views. This could represent subcutaneous gas from a gas-producing infection or artifact related to large ulceration. Correlation with physical exam findings requested. 2. No radiographic findings to suggest underlying osteomyelitis. Electronically signed by: Rogelia Myers MD 07/08/2024 06:49 PM EST RP Workstation: [id number]   US  Venous Img Lower Unilateral Right (DVT) Result Date: 07/08/2024 CLINICAL DATA:  58 year old male with right lower extremity pain and swelling. EXAM: RIGHT LOWER EXTREMITY VENOUS DOPPLER ULTRASOUND TECHNIQUE: Gray-scale sonography with graded compression, as well as color Doppler and duplex ultrasound were performed to evaluate the right lower extremity deep venous systems from the level of the common femoral vein and including the common femoral, femoral, profunda femoral, popliteal and calf veins including the posterior tibial, peroneal and gastrocnemius veins when visible. Spectral Doppler was utilized to evaluate flow at rest and with distal augmentation maneuvers in the common femoral, femoral and popliteal veins. The contralateral common femoral vein was also evaluated for comparison. COMPARISON:  02/15/2024 FINDINGS: RIGHT LOWER EXTREMITY Common Femoral Vein: No evidence of thrombus. Normal compressibility, respiratory phasicity and response to augmentation. Central Greater Saphenous Vein: No evidence of thrombus. Normal compressibility and flow on color Doppler imaging. Central Profunda Femoral Vein: No evidence of thrombus. Normal compressibility and flow on color Doppler imaging. Femoral Vein: No evidence of thrombus. Normal  compressibility, respiratory phasicity and response to augmentation. Popliteal Vein: No evidence of thrombus. Normal compressibility, respiratory phasicity and response to augmentation. Calf Veins: No evidence of thrombus. Normal compressibility and flow on color Doppler imaging. Other Findings:  None. LEFT LOWER EXTREMITY Common Femoral Vein: No evidence of thrombus. Normal compressibility, respiratory phasicity and response to augmentation. IMPRESSION: No  evidence of right lower extremity deep venous thrombosis. Ester Sides, MD [facility] Electronically Signed   By: Ester Sides M.D.   On: 07/08/2024 16:18     Imaging:  Exam Status  Status  Final [99]   PACS Intelerad Image Link   Show images for DG Foot Complete Right Study Result  Narrative & Impression  EXAM: 3 OR MORE VIEW(S) XRAY OF THE RIGHT FOOT 07/08/2024 06:24:20 PM   COMPARISON: 02/14/2024   CLINICAL HISTORY: Diabetic foot ulcer, evaluate for evidence of underlying osteomyelitis.   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 Diffuse osteopenia. Moderate midfoot osteoarthritis unchanged. No acute fracture. No focal osseous lesion. No joint dislocation.   SOFT TISSUES: Diffuse soft tissue edema throughout the foot with possible subcutaneous gas on the AP and oblique views.   IMPRESSION: 1. Diffuse soft tissue edema throughout the foot with possible subcutaneous gas on the AP and oblique views. This could represent subcutaneous gas from a gas-producing infection or artifact related to large ulceration. Correlation with physical exam findings requested. 2. No radiographic findings to suggest underlying osteomyelitis.   Electronically signed by: Rogelia Myers MD 07/08/2024 06:49 PM EST RP Workstation: [id number]   *Brief pod impression: Agree that there is soft tissue finding consistent with soft tissue  defect of ulceration versus possible abscess formation at the distal aspect of the wound - radiolucent striations are not present which is reassuring that subcutaneus gas is not present. Pending MRI for better comparison / clarification of any deeper seated infection or progression of underlying known chronic osteomyelitis.    Assessment  Diagnosis: Improving soft tissue infection of the right foot with resolving erythema and leukocytosis.  Status: Patient demonstrating clinical improvement with decreased local inflammation and no new systemic signs of infection. Advanced imaging and clinical impression is reassuring for no acute deeper seated infection noted. I discussed bone marrow edema signal noted to the cuboid - which clinically appears well covered without deep defect - that we can CTM closely or potentially if recurrent infection occurs consider bone infection and treat acutely. Patient aware.   Procedure: Bedside Excisional debridement performed on 07/09/2024. Full-thickness tissue debridement was carried out, including excision of all nonviable epidermal, dermal, extending into and including subcutaneous tissue utilizing sterile #15 blade, sterile tissue nippers/scissors, and sterile curette. Patient tolerated the procedure well without complications or need for cautery.   Plan  - ABX: Appreciate Medicine / ID / Pharmacy assist with antibiotic stewardship and appropriate coverage. Will likely require 10 days PO ABX coverage for outpatient setting SSTI / ascending cellulitis.   - Diet: Per primary. May resume  - Activity: NWB to thee RIGHT lower extremity ; should patient require WB only should be heel WB for transfer. Reinforced importance of strict non-weight-bearing (NWB) restrictions to optimize healing. Discussed patients current difficulty adhering to NWB due to occupational constraints. Explored options for improved compliance; conversation to continue during follow-up  visit.   - Wound Care: Dressing instructions. DAILY to BID dressing changes.  The adjacent wound borders were painted with betadine, the wound base was then dressed with sterile saline moistened gauze to the wound base, followed by dry 4x4 gauze, ABD pad, Kerlix and ACE.  Plan for more advanced wound care set up in outpatient setting.  Education & Return Precautions: Patient counseled extensively on wound care instructions, infection monitoring, and signs warranting immediate return (e.g., increased redness, drainage, odor, or systemic symptoms). Patient verbalized  understanding and agreement with the plan.  Dispo: Home with close clinic follow up - discussed later this week or early next week.    [name] 07/08/2024, 7:44 PM

## 2024-07-09 DIAGNOSIS — E11621 Type 2 diabetes mellitus with foot ulcer: Secondary | ICD-10-CM | POA: Diagnosis present

## 2024-07-09 DIAGNOSIS — Z87442 Personal history of urinary calculi: Secondary | ICD-10-CM | POA: Diagnosis not present

## 2024-07-09 DIAGNOSIS — Z7984 Long term (current) use of oral hypoglycemic drugs: Secondary | ICD-10-CM | POA: Diagnosis not present

## 2024-07-09 DIAGNOSIS — Z89431 Acquired absence of right foot: Secondary | ICD-10-CM

## 2024-07-09 DIAGNOSIS — E11628 Type 2 diabetes mellitus with other skin complications: Secondary | ICD-10-CM | POA: Diagnosis present

## 2024-07-09 DIAGNOSIS — F32A Depression, unspecified: Secondary | ICD-10-CM | POA: Diagnosis present

## 2024-07-09 DIAGNOSIS — Z7982 Long term (current) use of aspirin: Secondary | ICD-10-CM | POA: Diagnosis not present

## 2024-07-09 DIAGNOSIS — F419 Anxiety disorder, unspecified: Secondary | ICD-10-CM | POA: Diagnosis present

## 2024-07-09 DIAGNOSIS — Z823 Family history of stroke: Secondary | ICD-10-CM | POA: Diagnosis not present

## 2024-07-09 DIAGNOSIS — E1165 Type 2 diabetes mellitus with hyperglycemia: Secondary | ICD-10-CM | POA: Diagnosis present

## 2024-07-09 DIAGNOSIS — I10 Essential (primary) hypertension: Secondary | ICD-10-CM | POA: Diagnosis present

## 2024-07-09 DIAGNOSIS — L03115 Cellulitis of right lower limb: Secondary | ICD-10-CM

## 2024-07-09 DIAGNOSIS — Z8616 Personal history of COVID-19: Secondary | ICD-10-CM | POA: Diagnosis not present

## 2024-07-09 DIAGNOSIS — L97512 Non-pressure chronic ulcer of other part of right foot with fat layer exposed: Secondary | ICD-10-CM | POA: Diagnosis present

## 2024-07-09 DIAGNOSIS — M7989 Other specified soft tissue disorders: Secondary | ICD-10-CM | POA: Diagnosis present

## 2024-07-09 DIAGNOSIS — Z8249 Family history of ischemic heart disease and other diseases of the circulatory system: Secondary | ICD-10-CM | POA: Diagnosis not present

## 2024-07-09 DIAGNOSIS — L02611 Cutaneous abscess of right foot: Secondary | ICD-10-CM | POA: Diagnosis present

## 2024-07-09 DIAGNOSIS — Z794 Long term (current) use of insulin: Secondary | ICD-10-CM | POA: Diagnosis not present

## 2024-07-09 DIAGNOSIS — E1140 Type 2 diabetes mellitus with diabetic neuropathy, unspecified: Secondary | ICD-10-CM | POA: Diagnosis present

## 2024-07-09 DIAGNOSIS — Z89422 Acquired absence of other left toe(s): Secondary | ICD-10-CM | POA: Diagnosis not present

## 2024-07-09 DIAGNOSIS — L97419 Non-pressure chronic ulcer of right heel and midfoot with unspecified severity: Secondary | ICD-10-CM | POA: Diagnosis present

## 2024-07-09 DIAGNOSIS — Z833 Family history of diabetes mellitus: Secondary | ICD-10-CM | POA: Diagnosis not present

## 2024-07-09 DIAGNOSIS — E669 Obesity, unspecified: Secondary | ICD-10-CM | POA: Diagnosis present

## 2024-07-09 DIAGNOSIS — L03031 Cellulitis of right toe: Secondary | ICD-10-CM | POA: Diagnosis present

## 2024-07-09 DIAGNOSIS — Z8614 Personal history of Methicillin resistant Staphylococcus aureus infection: Secondary | ICD-10-CM | POA: Diagnosis not present

## 2024-07-09 LAB — WOUND CULTURE
MICRO NUMBER:: 17213636
SPECIMEN QUALITY:: ADEQUATE

## 2024-07-09 LAB — GLUCOSE, CAPILLARY
Glucose-Capillary: 88 mg/dL (ref 70–99)
Glucose-Capillary: 136 mg/dL — ABNORMAL HIGH (ref 70–99)
Glucose-Capillary: 175 mg/dL — ABNORMAL HIGH (ref 70–99)
Glucose-Capillary: 184 mg/dL — ABNORMAL HIGH (ref 70–99)
Glucose-Capillary: 279 mg/dL — ABNORMAL HIGH (ref 70–99)
Glucose-Capillary: 166 mg/dL — ABNORMAL HIGH (ref 70–99)

## 2024-07-09 LAB — CBC
HCT: 41.5 % (ref 39.0–52.0)
Hemoglobin: 13.2 g/dL (ref 13.0–17.0)
MCH: 28.1 pg (ref 26.0–34.0)
MCHC: 31.8 g/dL (ref 30.0–36.0)
MCV: 88.3 fL (ref 80.0–100.0)
Platelets: 251 K/uL (ref 150–400)
RBC: 4.7 MIL/uL (ref 4.22–5.81)
RDW: 15 % (ref 11.5–15.5)
WBC: 8.3 K/uL (ref 4.0–10.5)
nRBC: 0 % (ref 0.0–0.2)

## 2024-07-09 LAB — C-REACTIVE PROTEIN: CRP: 3 mg/dL — ABNORMAL HIGH (ref <1.0)

## 2024-07-09 NOTE — ED Notes (Signed)
Hospital Provider at bedside 

## 2024-07-09 NOTE — Progress Notes (Signed)
  Progress Note   Patient: [name] FMW:[id number] DOB: 04-27-1966 DOA: 07/08/2024     0 DOS: the patient was seen and examined on 07/09/2024   Brief hospital course: Partly taken from H&P.   [name] is a 58 y.o. 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   On presentation afebrile, mildly elevated blood pressure at 154/79, WBC 10.7, sed rate 40, blood glucose 233 Right foot x-ray showed diffuse soft tissue edema of the foot. Subcutaneous gas possibly related to large ulceration. No underlying osteomyelitis.   MRI was ordered and patient was started on broad-spectrum antibiotics with Zosyn and vancomycin .  11/11: Vital stable, preliminary blood cultures negative, CRP at 3.0.  MRI foot with progressive soft tissue ulceration and edema, no emphysema.  There was a new marrow edema and low-level enhancement within the cuboid which can be nonspecific or reactive, may represent early osteomyelitis.  Wound cultures collected at podiatry office-pending pending. Patient likely will get wound debridement by podiatry later today. If remains stable then can be discharged after 1 more day of IV antibiotics with a close outpatient podiatry follow-up.    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  Preliminary blood cultures negative and pending wound cultures which were taken at podiatrist office. Going for wound debridement later today Continue Zosyn vancomycin  Pain control  Uncontrolled type 2 diabetes mellitus with hyperglycemia, with long-term current use of insulin  (HCC) Sliding scale insulin  coverage while n.p.o. Continue aspirin   and atorvastatin   Essential hypertension Blood pressure currently within goal. Continue telmisartan    Subjective: Patient was seen and examined today.  He was feeling hungry and waiting for his procedure.  No significant pain.  Physical Exam: Vitals:   07/09/24 0848 07/09/24 1051 07/09/24 1353 07/09/24 1405  BP: 122/60 (!) 149/67 127/77 122/70  Pulse: 76 77 77 71  Resp: 16 17 16 14   Temp: 98.1 F (36.7 C) 98.1 F (36.7 C) 97.7 F (36.5 C) (!) 97.4 F (36.3 C)  TempSrc:   Oral Oral  SpO2: 99% 99% 99% 100%   General.  Obese gentleman, in no acute distress. Pulmonary.  Lungs clear bilaterally, normal respiratory effort. CV.  Regular rate and rhythm, no JVD, rub or murmur. Abdomen.  Soft, nontender, nondistended, BS positive. CNS.  Alert and oriented .  No focal neurologic deficit. Extremities.  No edema, pulses intact and symmetrical.  Right TMA and right foot with Ace wrap Psychiatry.  Judgment and insight appears normal.   Data Reviewed: Prior data reviewed  Family Communication: Discussed with patient  Disposition: Status is: Inpatient Remains inpatient appropriate because: Severity of illness  Planned Discharge Destination: Home  Time spent: 50 minutes  This record has been created using Conservation officer, historic buildings. Errors have been sought and corrected,but may not always be located. Such creation errors do not reflect on the standard of care.   Author: Amaryllis Dare, MD 07/09/2024 3:11 PM  For on call review [email or web address].

## 2024-07-09 NOTE — Hospital Course (Addendum)
 Partly taken from H&P.   [name] is a 58 y.o. 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   On presentation afebrile, mildly elevated blood pressure at 154/79, WBC 10.7, sed rate 40, blood glucose 233 Right foot x-ray showed diffuse soft tissue edema of the foot. Subcutaneous gas possibly related to large ulceration. No underlying osteomyelitis.   MRI was ordered and patient was started on broad-spectrum antibiotics with Zosyn and vancomycin .  11/11: Vital stable, preliminary blood cultures negative, CRP at 3.0.  MRI foot with progressive soft tissue ulceration and edema, no emphysema.  There was a new marrow edema and low-level enhancement within the cuboid which can be nonspecific or reactive, may represent early osteomyelitis.  Wound cultures collected at podiatry office-pending pending. Patient likely will get wound debridement by podiatry later today. If remains stable then can be discharged after 1 more day of IV antibiotics with a close outpatient podiatry follow-up.

## 2024-07-09 NOTE — ED Notes (Signed)
 NURSE JENNA INFORMED OF ASSIGNED BED

## 2024-07-09 NOTE — Plan of Care (Signed)

## 2024-07-09 NOTE — ED Notes (Signed)
 Pump beeping. RN to bedside. Alarm IV occluded; RN inspected site - no issues seen. Patient denied pain. Pump restarted. RN discussed patient's diet changed to NPO except sips with meds. Patient verbalized understanding.

## 2024-07-09 NOTE — Inpatient Diabetes Management (Signed)
 Inpatient Diabetes Program Recommendations  AACE/ADA: New Consensus Statement on Inpatient Glycemic Control   Target Ranges:  Prepandial:   less than 140 mg/dL      Peak postprandial:   less than 180 mg/dL (1-2 hours)      Critically ill patients:  140 - 180 mg/dL    Latest Reference Range & Units 07/08/24 21:56  Glucose-Capillary 70 - 99 mg/dL 727 (H)    Latest Reference Range & Units 07/08/24 17:02  Glucose 70 - 99 mg/dL 766 (H)    Latest Reference Range & Units 11/03/23 07:34 02/15/24 05:35 06/07/24 07:29  Hemoglobin A1C 4.6 - 6.5 % 9.6 (H) 6.7 (H) 8.1 (H)   Review of Glycemic Control  Diabetes history: DM2 Outpatient Diabetes medications: Toujeo  20 units daily, Metformin  XR 500 mg QAM Current orders for Inpatient glycemic control: Novolog  0-15 units Q4H  Inpatient Diabetes Program Recommendations:    Insulin : Please consider ordering Semglee  10 units Q24H.  Thanks, Earnie Gainer, RN, MSN, CDCES Diabetes Coordinator Inpatient Diabetes Program [phone] (Team Pager from 8am to 5pm)

## 2024-07-09 NOTE — ED Notes (Signed)
 MD at bedside.

## 2024-07-10 DIAGNOSIS — L97512 Non-pressure chronic ulcer of other part of right foot with fat layer exposed: Secondary | ICD-10-CM | POA: Diagnosis not present

## 2024-07-10 LAB — BASIC METABOLIC PANEL WITH GFR
Anion gap: 10 (ref 5–15)
BUN: 10 mg/dL (ref 6–20)
CO2: 26 mmol/L (ref 22–32)
Calcium: 8.7 mg/dL — ABNORMAL LOW (ref 8.9–10.3)
Chloride: 102 mmol/L (ref 98–111)
Creatinine, Ser: 0.79 mg/dL (ref 0.61–1.24)
GFR, Estimated: >60 mL/min (ref >60)
Glucose, Bld: 131 mg/dL — ABNORMAL HIGH (ref 70–99)
Potassium: 4.5 mmol/L (ref 3.5–5.1)
Sodium: 138 mmol/L (ref 135–145)

## 2024-07-10 LAB — GLUCOSE, CAPILLARY
Glucose-Capillary: 127 mg/dL — ABNORMAL HIGH (ref 70–99)
Glucose-Capillary: 138 mg/dL — ABNORMAL HIGH (ref 70–99)
Glucose-Capillary: 139 mg/dL — ABNORMAL HIGH (ref 70–99)
Glucose-Capillary: 152 mg/dL — ABNORMAL HIGH (ref 70–99)
Glucose-Capillary: 173 mg/dL — ABNORMAL HIGH (ref 70–99)

## 2024-07-10 LAB — CREATININE, SERUM
Creatinine, Ser: 0.86 mg/dL (ref 0.61–1.24)
GFR, Estimated: >60 mL/min (ref >60)

## 2024-07-10 MED ORDER — CALCIUM CARBONATE ANTACID 500 MG PO CHEW
4.0000 | CHEWABLE_TABLET | Freq: Three times a day (TID) | ORAL | Status: DC | PRN
  Administered 2024-07-10: 800 mg via ORAL
  Filled 2024-07-10: qty 4

## 2024-07-10 NOTE — Progress Notes (Incomplete)
  Progress Note   Patient: [name] FMW:[id number] DOB: [birth year] DOA: 07/08/2024     1 DOS: the patient was seen and examined on 07/10/2024   Brief hospital course: Partly taken from H&P.   [name] is a 58 y.o. 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   On presentation afebrile, mildly elevated blood pressure at 154/79, WBC 10.7, sed rate 40, blood glucose 233 Right foot x-ray showed diffuse soft tissue edema of the foot. Subcutaneous gas possibly related to large ulceration. No underlying osteomyelitis.   MRI was ordered and patient was started on broad-spectrum antibiotics with Zosyn and vancomycin .  11/11: Vital stable, preliminary blood cultures negative, CRP at 3.0.  MRI foot with progressive soft tissue ulceration and edema, no emphysema.  There was a new marrow edema and low-level enhancement within the cuboid which can be nonspecific or reactive, may represent early osteomyelitis.  Wound cultures collected at podiatry office-pending pending. Patient likely will get wound debridement by podiatry later today. If remains stable then can be discharged after 1 more day of IV antibiotics with a close outpatient podiatry follow-up.    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  Preliminary blood cultures negative and pending wound cultures which were taken at podiatrist office. Going for wound debridement later today Continue Zosyn vancomycin  Pain control  Uncontrolled type 2 diabetes mellitus with hyperglycemia, with long-term current use of insulin  (HCC) Sliding scale insulin  coverage while n.p.o. Continue aspirin   and atorvastatin   Essential hypertension Blood pressure currently within goal. Continue telmisartan    Subjective: Patient was seen and examined today.  He was feeling hungry and waiting for his procedure.  No significant pain.  Physical Exam: Vitals:   07/09/24 1536 07/09/24 1954 07/10/24 0412 07/10/24 0732  BP: 116/64 126/65 (!) 118/54 (!) 114/55  Pulse: 67 89 71 74  Resp: 16 16 16 16   Temp: (!) 97.4 F (36.3 C) 97.8 F (36.6 C) (!) 97.4 F (36.3 C) 97.6 F (36.4 C)  TempSrc: Oral Oral  Oral  SpO2: 98% 97% 99% 97%  Weight: 105.4 kg     Height: 5' 9 (1.753 m)      General.  Obese gentleman, in no acute distress. Pulmonary.  Lungs clear bilaterally, normal respiratory effort. CV.  Regular rate and rhythm, no JVD, rub or murmur. Abdomen.  Soft, nontender, nondistended, BS positive. CNS.  Alert and oriented .  No focal neurologic deficit. Extremities.  No edema, pulses intact and symmetrical.  Right TMA and right foot with Ace wrap Psychiatry.  Judgment and insight appears normal.   Data Reviewed: Prior data reviewed  Family Communication: Discussed with patient  Disposition: Status is: Inpatient Remains inpatient appropriate because: Severity of illness  Planned Discharge Destination: Home  Time spent: 50 minutes  This record has been created using Conservation officer, historic buildings. Errors have been sought and corrected,but may not always be located. Such creation errors do not reflect on the standard of care.   Author: Alban Pepper, MD 07/10/2024 9:13 AM  For on call review [email or web address].

## 2024-07-10 NOTE — TOC CM/SW Note (Signed)
 Transition of Care [facility]) - Inpatient Brief Assessment   Patient Details  Name: [name] MRN: [id number] Date of Birth: Mar 26, 1966  Transition of Care [facility]) CM/SW Contact:    Daved JONETTA Hamilton, RN Phone Number: 07/10/2024, 8:43 AM   Clinical Narrative:   Transition of Care [facility]) Screening Note   Patient Details  Name: [name] Date of Birth: 1965-10-16   Transition of [facility]) CM/SW Contact:    Daved JONETTA Hamilton, RN Phone Number: 07/10/2024, 8:43 AM  Food and social resources added to AVS.  Transition of Care Department [facility]) has reviewed patient and no TOC needs have been identified at this time. If new patient transition needs arise, please place a TOC consult.    Transition of Care Asessment: Insurance and Status: Insurance coverage has been reviewed Patient has primary care physician: Yes   Prior level of function:: Independent Prior/Current Home Services: No current home services Social Drivers of Health Review: SDOH reviewed interventions complete Readmission risk has been reviewed: Yes Transition of care needs: no transition of care needs at this time

## 2024-07-10 NOTE — Discharge Instructions (Addendum)
 For your foot, please:  Change your dressing daily, please cleanse the wound, then paint with betadine, then dress with sterile saline moistened gauze to the wound base, followed by dry 4x4 gauze, ABD pad, Kerlix and ACE.  You should remain non weight bearing for your R foot to help with healing.   Please finish the antibiotics Augmentin  (amoxicillin -clavulanate) and doxycycline  and prescribed. You will have close follow up with Dr. Tanda your podiatrist and your PCP.           Food Resources  Agency Name: [facility] Agency Address: [address] [place], [place] [address] Phone: [phone] Website: www.[email or web address] Service(s) Offered: Housing services, self-sufficiency, congregate meal program, weatherization program, event organiser program, emergency food assistance,  housing counseling, home ownership program, wheels - to work program.  [employer] free for 60 and older at various locations from usaa, Monday-Friday:  [employer], [address] [place], [phone] -[facility], [address], [place] [phone]  -[facility], [address], [place] [phone]  -[address], [address], [place], [phone]  Agency Name: [facility] Address: [phone], [address], [place], [place] [address] Phone: [phone] Website: [email or web address] Service(s) Offered: Home delivered hot, frozen, and emergency  meals. Grocery assistance program which matches  volunteers one-on-one with seniors unable to grocery shop  for themselves. Must be 60 years and older; less than 20  hours of in-home aide service, limited or no driving ability;  live alone or with someone with a disability; live in  [place].  Agency Name: [employer] [facility] [employer]) Address: [address], [place], [place] [address] Phone: [phone] Service(s) Offered:  Food is served to shut-ins, homeless, elderly, and low income people in the community every Saturday (11:30 am-12:30 pm) and Sunday (12:30 pm-1:30pm). Volunteers also offer help and encouragement in seeking employment,  and spiritual guidance.  Agency Name: [employer] Address: [address] [place], [place] [address] Phone: [phone] Service(s) Offered: Child support services; child welfare services; food stamps; Medicaid; work first family assistance; and aid with fuel,  rent, food and medicine.  Agency Name: [employer]itian Address: [address] [place], [place] Phone: [phone] Website: [email or web address] Services Offered: Monday 10:00am-12:00, 8:00pm-9:00pm, and Friday 10:00am-12:00.  Agency Name: [employer] [facility] [place] Address: [address] [place], [place] [address] Phone: [phone] Website: www.[email or web address] Service(s) Offered: Serves weekday meals, open from 11:30 am- 1:00 pm., and 6:30-7:30pm, Monday-Wednesday-Friday distributes food 3:30-6pm, Monday-Wednesday-Friday.  Agency Name: [facility] Address: [address], [place], [place] Phone: [phone] Website: [email or web address] Services Offered: Distributes food the 4th Saturday of the month, starting at 8:00 am  Agency Name: [facility] Address: [phone], [place], [place] [address] Phone: [phone] Website: [email or web address] Service(s) Offered: Bread of life, weekly food pantry. Open Wednesdays from 10:00am-noon.  Agency Name: [facility] [employer] Address: [address] [place], [place], [place] Phone: [phone] Services Offered: Distributes food 9am-1pm, Monday-Thursday. Call for details.  Agency Name: [facility] Address: [address], [place], [place] [address] Phone: [phone] Website: [email or web address] Service(s) Offered: Pharmacist, Hospital. Call for assistance.  Agency Name: [facility] Address: [address] [place], [place] [address] Phone: [phone] Service Offered: Emergency Food Pantry. Call for appointment.  Agency Name: [facility] Address: [address] [place], [place] [address] Phone: [phone] Website: [email or web address] Services Offered: Games Developer. Call for details  Agency Name: [facility] Address: [address] [place], [place] Phone: [phone] Website: [email or web address] Service(s) Offered: Emergency Food Pantry. Call for details.  Agency Name: Holiday Representative Address: [address], [place], [place] [address] Phone: [phone] or [phone] Website: [email or web address] Service(s) Offered: Distribute food 9am-11:30 am, Tuesday-Friday, and 1-3:30pm, Monday-Friday. Food pantry Monday-Friday 1pm-3pm, fresh items, Mon.-Wed.-Fri.  Agency Name: [facility] Address: [address] [place], [place] [address] Phone: [phone] Website: [email or web address] Services Offered: Distribute food Tues and Sats from 9:00am-noon. Closed 1st Saturday of each month. Call for details  Agency Name: [employer] Address: Fayrene [address]man [facility] [address], [place] [address] Phone: [phone]  Services Offered: Delivers meals every Thursday   Do you feel isolated?  [facility] [employer] [facility] offers a [employer] that anyone can call toll free at [phone]. The friendship line is available 24 hours a day  [employer] is a Program of All-inclusive Care for the Elderly (PACE). Their mission is to promote and sustain the independence of seniors wishing to remain in the community. They provide seniors with comprehensive long-term health, social, medical and dietary care. Their program is a safe alternative to nursing home care. [phone]  [facility]  Physical Address [facility] [address] [place], [place] [address] Phone: [phone]. . Online zoom yoga class, connect with others without leaving your home [facility] offers [place] dance cardio sessions for individuals via Zoom. This program provides: - Dance fitness activities Please contact program for more information. Servinganyone in need adults 18+ hiv/aids individuals families Call [phone]  Email [email or web address] to get more info  [employer] offers an online [employer] to individuals where they can receive help to focus on their best health. Whether you're a [employer] member or not, the neighborhood center offers a... Main Serviceshealth education  exercise & fitness  community support services  recreation  virtual support Other Servicessupport groups Servinganyone in need adults young adults teens seniors individuals families [email or web address] to get more info  Schedule on their website  [facility] offers an array of activities for adults age 50 and over. This program provides:- Fitness and health programs- Tech classes- Activity books Main Serviceshealth education  community support services  exercise & fitness  recreation  more education Servingseniors  Call [phone]    For more resources go online to [email or web address].co.uk and type in you zipcode   Agency Name: [facility] Agency Address: [address] [place], [place] [address] Phone: [phone] Website: www.[email or web address] Service(s) Offered: Housing services, self-sufficiency, congregate meal program, and individual development account program.  Agency Name: [employer] [facility] [place] Address: [address], [place], [place] [address] Phone: [phone] Email: [email or web address] Website: www.[email or web address] Service(s) Offered: Housing the homeless, feeding the hungry, buyer, retail, job and education related services.  Agency Name: [facility] Address: [address], [place], [place] [address] Phone: [phone] Email: [email or web address] Service(s) Offered: Counseling, problem pregnancy, advocacy for Hispanics, limited emergency financial assistance.  Agency Name: Department of Social Services Address: [address] [place], [place] [address] Phone: [phone] Website: [email or web address] Service(s) Offered: Child support services; child welfare services; SNAP; Medicaid; work first family assistance; and aid with fuel,  rent, food and medicine.  Agency Name: [employer] Representative Address: [address] [place], [place] [address] Phone: [phone] or [phone] Email: [email or web address] Service(s) Offered: Family services and transient assistance; emergency food, fuel, clothing, limited furniture, utilities; budget  counseling, general counseling; give a kid a coat; thrift store; Christmas food and toys. Utility assistance, food pantry, rental  assistance, life sustaining medicine

## 2024-07-10 NOTE — Progress Notes (Signed)
 PODIATRY: PROGRESS NOTE    O/N: [name]  Subjective:  Patient resting comfortably at bedside. Does endorse mild posterior calf tenderness - swelling has resolved. Denies SOB. Denies F/C/N/V/SOB/CP.    PHYSICAL EXAMINATION: BP (!) 114/55 (BP Location: Right Arm)   Pulse 74   Temp 97.6 F (36.4 C) (Oral)   Resp 16   Ht 5' 9 (1.753 m)   Wt 105.4 kg   SpO2 97%   BMI 34.32 kg/m ? GEN: NAD. AOX3. ? RESP: Non-labored breathing on RA.? ABD: NT/ND of all four quadrants.? NEURO: Moving all four extremities spontaneously. ?    FOCUSED LOWER EXTREMITY EXAMINATION: RIGHT FOOT    Neurological:  - Protective sensation absent - Gross protective sensation diminished  - No focal motor or sensory deficits identified bilaterally.  - Able to DF/Evert foot (prior h/o drop foot?)   Vascular:  - Dorsalis Pedis artery on palpation: 2 - Posterior Tibial artery on palpation: 1 - TMA flap refill time - WNL  - Peripheral edema: 1+ - Dependency Rubor: yes     Musculoskeletal:  S/p TMA  Muscle strength: 5/5 in all 4 quadrants  Ankle Joint ROM: decreased, equinus noted Global foot - TTP: none   Dermatological:  - Skin quality: atrophic   #WOUND 1    Type: Full thickness neuropathic ulceration --- Wound base: 70% Granular ; 30% Fibrosis ; 0% Necrotic  Adjacent tissue/ Wound borders: hyperkeratotic Undermining noted (Pre-debridement): none ; Direction: N/A  PTB: no Malodor: yes ; improved Active Drainage: no; If yes, consistency: n/a   Wound Measurements: 5.5cm (l) x 5.0cm (w) x 0.5cm (d)  Methods for Debridement: Sharp (#15/10 blade), Mechanical (Tissue nipper / Curette) Cautery needed(?): no   Debridement conducted without incident by above methods to granular wound base tissue with necrotic / fibrotic / biofilm excised by above described methods.   Results for orders placed or performed during the hospital encounter of 07/08/24  Blood culture (routine x 2)     Status: None  (Preliminary result)   Collection Time: 07/08/24  7:30 PM   Specimen: BLOOD  Result Value Ref Range Status   Specimen Description BLOOD LEFT ANTECUBITAL  Final   Special Requests   Final    BOTTLES DRAWN AEROBIC AND ANAEROBIC Blood Culture adequate volume   Culture   Final    NO GROWTH < 12 HOURS Performed at [facility], [address] [place], [place] [address]    Report Status PENDING  Incomplete      Assessment   Diagnosis: Improving soft tissue infection of the right foot with resolving erythema and leukocytosis.   Status: Patient demonstrating clinical improvement with decreased local inflammation and no new systemic signs of infection.  Advanced imaging and clinical impression is reassuring for no acute deeper seated infection noted. I discussed bone marrow edema signal noted to the cuboid - which clinically appears well covered without deep defect - that we can CTM closely or potentially if recurrent infection occurs consider bone infection and treat acutely. Patient aware.    Procedure: Bedside debridement performed on 07/09/2024. Full-thickness tissue debridement was carried out, including excision of all nonviable epidermal, dermal, and subcutaneous tissue utilizing sterile #15 blade, tissue nippers/scissors, and curette. Patient tolerated the procedure well without complications or need for cautery. - no bedside debridement conducted today 07/10/2024     Plan   - ABX: Appreciate Medicine / ID / Pharmacy assist with antibiotic stewardship and appropriate coverage. Will likely require 10 days PO ABX  coverage for outpatient setting SSTI / ascending cellulitis.    - Diet: Per primary. May resume   - Activity: NWB to thee RIGHT lower extremity ; should patient require WB only should be heel WB for transfer. Reinforced importance of strict non-weight-bearing (NWB) restrictions to optimize healing. Discussed patient's current difficulty adhering to NWB due to  occupational constraints. Explored options for improved compliance; conversation to continue during follow-up visit.     - Wound Care: Dressing instructions. DAILY to BID dressing changes.  The adjacent wound borders were painted with betadine, the wound base was then dressed with sterile saline moistened gauze to the wound base, followed by dry 4x4 gauze, ABD pad, Kerlix and ACE.  Plan for more advanced wound care set up in outpatient setting. *will likely require dressing supplies or dressing supply company contact to conduct at home dressings. We discussed briefly at bedside - patient has minimal dressing supplies at home currently.    Education & Return Precautions: Patient counseled extensively on wound care instructions, infection monitoring, and signs warranting immediate return (e.g., increased redness, drainage, odor, or systemic symptoms). Patient verbalized understanding and agreement with the plan.   Dispo: Home with close clinic follow up - discussed later this week or early next week.  - recc: wound care supplies to be dispensed / or dressing supply contact company to be verified? - recc: TB on posterior calf pain ensure finding via outside facility r/o DVT. Patient is not experiencing SOB. Patient aware of s/sx of DVT and will monitor.

## 2024-07-10 NOTE — TOC Progression Note (Addendum)
 Transition of Care [facility]) - Progression Note    Patient Details  Name: [name] MRN: [id number] Date of Birth: April 10, 1966  [facility]) CM/SW Contact  Daved JONETTA Hamilton, RN Phone Number: 07/10/2024, 1:10 PM  Clinical Narrative:     TOC notified patient will need wound care dressing changes x2 daily. HH Nurse can be requested however [facility] Nurse does not visit home daily. Awaiting confirmation from patient that he has support at home that can perform dressing changes between [facility] Nurse visits.  TOC continuing to follow.   UPDATE:  Patient independent in all ADL's including driving self wherever he needs. Patient verbalized he will perform his own wound care/dressing changes. Additional resources added to AVS for utilities, no further TOC needs.   TOC signing off.                    Expected Discharge Plan and Services                                               Social Drivers of Health (SDOH) Interventions SDOH Screenings   Food Insecurity: Food Insecurity Present (07/09/2024)  Housing: Low Risk  (07/09/2024)  Recent Concern: Housing - High Risk (06/10/2024)  Transportation Needs: No Transportation Needs (07/09/2024)  Utilities: Not At Risk (07/09/2024)  Depression (PHQ2-9): High Risk (06/11/2024)  Financial Resource Strain: High Risk (06/10/2024)  Physical Activity: Sufficiently Active (06/10/2024)  Social Connections: Socially Isolated (06/10/2024)  Stress: Stress Concern Present (06/10/2024)  Tobacco Use: Low Risk  (07/08/2024)    Readmission Risk Interventions     No data to display

## 2024-07-11 ENCOUNTER — Telehealth: Payer: Self-pay

## 2024-07-11 NOTE — Transitions of Care (Post Inpatient/ED Visit) (Signed)
   07/11/2024  Name: [name] MRN: [id number] DOB: 09-Jul-1966  Today's TOC FU Call Status: Today's TOC FU Call Status:: Unsuccessful Call (1st Attempt) Unsuccessful Call (1st Attempt) Date: 07/11/24  Attempted to reach the patient regarding the most recent Inpatient/ED visit.  Follow Up Plan: Additional outreach attempts will be made to reach the patient to complete the Transitions of Care (Post Inpatient/ED visit) call.   Arvin Seip RN, BSN, CCM [employer], Population Health Case Manager Phone: [phone]

## 2024-07-12 ENCOUNTER — Telehealth: Payer: Self-pay

## 2024-07-12 MED ORDER — CIPROFLOXACIN HCL 500 MG PO TABS: 500.0000 mg | ORAL_TABLET | Freq: Two times a day (BID) | ORAL | 0 refills | Status: DC

## 2024-07-12 NOTE — Discharge Summary (Signed)
 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 [facility] Course: Partly taken from H&P.   [name] is a 58 y.o. 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   On presentation afebrile, mildly elevated blood pressure at 154/79, WBC 10.7, sed rate 40, blood glucose 233 Right foot x-ray showed diffuse soft tissue edema of the foot. Subcutaneous gas possibly related to large ulceration. No underlying osteomyelitis.   MRI was ordered and patient was started on broad-spectrum antibiotics with Zosyn and vancomycin .  11/11: Vital stable, preliminary blood cultures negative, CRP at 3.0.  MRI  R foot with progressive soft tissue ulceration and edema, no emphysema.  There was a new marrow edema and low-level enhancement within the cuboid which can be nonspecific or reactive, may represent early osteomyelitis.  Wound cultures collected at podiatry office-pending. Patient underwent bedside debridement 07/09/2024. And was continued on IV antibiotics until discharge. He had improvement in his soft  tissue infection and was discharged on the antibiotics he was prescribed prior to presenting to the hospital (doxycycline  and augmentin ). He will have close follow up with podiatry team.    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  Preliminary blood cultures negative and pending wound cultures which were taken at podiatrist office. Bedside debridement 11/11. On vancomycin  and zosyn while hospitalized. His cellulitis improved.  Podiatry team discussed with patient the importance of remaining NWB to his R foot. He will discharge on previously prescribed antibiotics doxycycline  and augmentin .     Uncontrolled type 2 diabetes mellitus with hyperglycemia, with long-term current use of insulin  (HCC) A1c 8.1.    Essential hypertension Blood pressure currently within goal. Continue telmisartan           Consultants: Podiatry  Procedures performed: bedside debridement of foot ulcers   Disposition: Home Diet recommendation:  Discharge Diet Orders (From admission, onward)     Start     Ordered   07/10/24 0000  Diet Carb Modified        07/10/24 1433           Carb modified diet DISCHARGE MEDICATION: Allergies as of 07/10/2024   No Known Allergies      Medication List     TAKE these medications    amoxicillin -clavulanate 875-125 MG tablet Commonly known as: AUGMENTIN  Take 1 tablet by mouth 2 (two) times daily.   aspirin  EC 81 MG tablet Take 81 mg by mouth daily. Swallow whole.   atorvastatin   20 MG tablet Commonly known as: LIPITOR Take 1 tablet (20 mg total) by mouth daily.   doxycycline  100 MG tablet Commonly known as: VIBRA -TABS Take 1 tablet (100 mg total) by mouth 2 (two) times daily for 10 days.   DULoxetine  60 MG capsule Commonly known as: CYMBALTA  TAKE 1 CAPSULE  BY MOUTH EVERY DAY   metFORMIN  500 MG 24 hr tablet Commonly known as: GLUCOPHAGE -XR TAKE 1 TABLET BY MOUTH EVERY DAY WITH BREAKFAST   multivitamin with minerals tablet Take 1 tablet by mouth daily.   oxyCODONE  5 MG immediate release tablet Commonly known as: Roxicodone  Take 1.5 tablets (7.5 mg total) by mouth daily. As needed for severe pain What changed: Another medication with the same name was removed. Continue taking this medication, and follow the directions you see here.   Pen Needles 32G X 6 MM Misc Use to give insulin  once daily.   Sodium Fluoride 5000 Sensitive 1.1-5 % Gel Generic drug: Sod Fluoride-Potassium Nitrate Brush as directed by your provider.   tamsulosin  0.4 MG Caps capsule Commonly known as: FLOMAX  Take 1 capsule (0.4 mg total) by mouth daily.   telmisartan  20 MG tablet Commonly known as: MICARDIS  Take 1 tablet (20 mg total) by mouth daily. TAKE 1 TABLET BY MOUTH EVERYDAY AT BEDTIME   Toujeo  Max SoloStar 300 UNIT/ML Solostar Pen Generic drug: insulin  glargine (2 Unit Dial) Inject 20 Units into the skin daily.               Discharge Care Instructions  (From admission, onward)           Start     Ordered   07/10/24 0000  Discharge wound care:       Comments: Daily,   Paint wound borders of R foot ulcer with betadine, dress with sterile saline moistened gauze to the wound base, followed by dry 4x4 gauze, ABD pad, Kerlix and ACE.   07/10/24 1433            Follow-up Information     Darrell Verneita CROME, MD Follow up.   Specialty: Internal Medicine Why: hospital follow up Contact information: [address] [place] [address] [phone]                Discharge Exam: [name]   07/09/24 1536  Weight: 105.4 kg   Physical Exam  Constitutional: In no distress.  Cardiovascular: Normal rate, regular rhythm. No lower extremity edema  Pulmonary: Non labored breathing on room air, no wheezing or rales.    Abdominal: Soft. Non distended and non tender Musculoskeletal: R foot TMA    Neurological: Alert and oriented to person, place, and time. Non focal  Skin: Skin is warm and dry.    Condition at discharge: fair  The results of significant diagnostics from this hospitalization (including imaging, microbiology, ancillary and laboratory) are listed below for reference.   Imaging Studies: MR FOOT RIGHT W WO CONTRAST Result Date: 07/09/2024 CLINICAL DATA:  Diabetic foot infection. EXAM: MRI OF THE RIGHT FOREFOOT WITHOUT AND WITH CONTRAST TECHNIQUE: Multiplanar, multisequence MR imaging of the right foot was performed before and after the administration of intravenous contrast. CONTRAST:  10mL GADAVIST  GADOBUTROL  1 MMOL/ML IV SOLN COMPARISON:  Radiographs 07/08/2024 and 02/14/2024.  MRI 07/11/2023. FINDINGS: Bones/Joint/Cartilage Stable postsurgical changes from previous transmetatarsal amputation. Compared with the previous MRI, there has been interval improvement in the previously demonstrated marrow edema and enhancement in the medial forefoot, involving the navicular, cuneiform bones and bases of  the metatarsals. The medial and middle cuneiform bones appear ankylosed. There is new marrow edema and low level enhancement within the cuboid. No bone destruction, cortical fracture or dislocation identified. No significant joint effusions or abnormal synovial enhancement. Stable mild midfoot degenerative changes. Ligaments The anterior talofibular ligament appears chronically attenuated, but intact. No acute ligamentous findings are identified within the hindfoot or midfoot. Muscles and Tendons The ankle tendons appear intact, without significant tenosynovitis. Generalized foot muscular atrophy. Soft tissues Progressive soft tissue ulceration along the inferior medial aspect of the midfoot and remaining forefoot. Associated surrounding skin thickening, subcutaneous edema and enhancement. No focal fluid  collection or definite soft tissue emphysema demonstrated. IMPRESSION: 1. Progressive soft tissue ulceration along the inferior medial aspect of the midfoot and remaining forefoot with surrounding cellulitis. No focal fluid collection or definite soft tissue emphysema. 2. Interval improvement in previously demonstrated marrow edema and enhancement in the medial forefoot, involving the navicular, cuneiform bones and bases of the metatarsals. There is new marrow edema and low level enhancement within the cuboid. These findings are nonspecific and could be reactive or secondary to early osteomyelitis. 3. No evidence of septic arthritis or septic tenosynovitis. Electronically Signed   By: Elsie Perone M.D.   On: 07/09/2024 08:18   DG Foot Complete Right Result Date: 07/08/2024 EXAM: 3 OR MORE VIEW(S) XRAY OF THE RIGHT FOOT 07/08/2024 06:24:20 PM COMPARISON: 02/14/2024 CLINICAL HISTORY: Diabetic foot ulcer, evaluate for evidence of underlying osteomyelitis. 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 Diffuse osteopenia. Moderate midfoot osteoarthritis unchanged. No acute fracture. No focal osseous lesion. No joint dislocation. SOFT TISSUES: Diffuse soft tissue edema throughout the foot with possible subcutaneous gas on the AP and oblique views. IMPRESSION: 1. Diffuse soft tissue edema throughout the foot with possible subcutaneous gas on the AP and oblique views. This could represent subcutaneous gas from a gas-producing infection or artifact related to large ulceration. Correlation with physical exam findings requested. 2. No radiographic findings to suggest underlying osteomyelitis. Electronically signed by: Rogelia Myers MD 07/08/2024 06:49 PM EST RP Workstation: [id number]   US  Venous Img Lower Unilateral Right (DVT) Result Date: 07/08/2024 CLINICAL DATA:  58 year old male with right lower extremity pain and swelling. EXAM: RIGHT LOWER EXTREMITY VENOUS DOPPLER  ULTRASOUND TECHNIQUE: Gray-scale sonography with graded compression, as well as color Doppler and duplex ultrasound were performed to evaluate the right lower extremity deep venous systems from the level of the common femoral vein and including the common femoral, femoral, profunda femoral, popliteal and calf veins including the posterior tibial, peroneal and gastrocnemius veins when visible. Spectral Doppler was utilized to evaluate flow at rest and with distal augmentation maneuvers in the common femoral, femoral and popliteal veins. The contralateral common femoral vein was also evaluated for comparison. COMPARISON:  02/15/2024 FINDINGS: RIGHT LOWER EXTREMITY Common Femoral Vein: No evidence of thrombus. Normal compressibility, respiratory phasicity and response to augmentation. Central Greater Saphenous Vein: No evidence of thrombus. Normal compressibility and flow on color Doppler imaging. Central Profunda Femoral Vein: No evidence of thrombus. Normal compressibility and flow on color Doppler imaging. Femoral Vein: No evidence of thrombus. Normal compressibility, respiratory phasicity and response to augmentation. Popliteal Vein: No evidence of thrombus. Normal compressibility, respiratory phasicity and response to augmentation. Calf Veins: No evidence of thrombus. Normal compressibility and flow on color Doppler imaging. Other Findings:  None. LEFT LOWER EXTREMITY Common Femoral Vein: No evidence of thrombus. Normal compressibility, respiratory phasicity and response to augmentation. IMPRESSION: No evidence of right  lower extremity deep venous thrombosis. Ester Sides, MD [facility] Radiology Electronically Signed   By: Ester Sides M.D.   On: 07/08/2024 16:18    Microbiology: Results for orders placed or performed during the hospital encounter of 07/08/24  Blood culture (routine x 2)     Status: None (Preliminary result)   Collection Time: 07/08/24   7:30 PM   Specimen: BLOOD  Result Value Ref Range Status   Specimen Description BLOOD LEFT ANTECUBITAL  Final   Special Requests   Final    BOTTLES DRAWN AEROBIC AND ANAEROBIC Blood Culture adequate volume   Culture   Final    NO GROWTH 4 DAYS Performed at [facility], [address] [place], [place] [address]    Report Status PENDING  Incomplete    Labs: CBC: Recent Labs  Lab 07/08/24 0941 07/08/24 1702 07/09/24 1325  WBC 8.8 10.7* 8.3  NEUTROABS 5.5 6.8  --   HGB 13.3 13.4 13.2  HCT 40.5 40.8 41.5  MCV 86.5 86.6 88.3  PLT 234.0 252 251   Basic Metabolic Panel: Recent Labs  Lab 07/08/24 1702 07/10/24 0423 07/10/24 0805  NA 136  --  138  K 4.0  --  4.5  CL 101  --  102  CO2 24  --  26  GLUCOSE 233*  --  131*  BUN 14  --  10  CREATININE 0.87 0.86 0.79  CALCIUM  8.3*  --  8.7*   Liver Function Tests: Recent Labs  Lab 07/08/24 1702  AST 31  ALT 29  ALKPHOS 46  BILITOT 0.7  PROT 7.8  ALBUMIN 3.4*   CBG: Recent Labs  Lab 07/10/24 0044 07/10/24 0414 07/10/24 0733 07/10/24 1143 07/10/24 1533  GLUCAP 139* 127* 138* 173* 152*    Discharge time spent: greater than 30 minutes.  Signed: Alban Pepper, MD [facility] 07/12/2024

## 2024-07-12 NOTE — Transitions of Care (Post Inpatient/ED Visit) (Signed)
   07/12/2024  Name: JAXON FLATT MRN: [id number] DOB: 1965-09-09  Today's TOC FU Call Status: Today's TOC FU Call Status:: Unsuccessful Call (2nd Attempt) Unsuccessful Call (2nd Attempt) Date: 07/12/24  Attempted to reach the patient regarding the most recent Inpatient/ED visit.  Follow Up Plan: Additional outreach attempts will be made to reach the patient to complete the Transitions of Care (Post Inpatient/ED visit) call.   Arvin Seip RN, BSN, CCM [employer], Population Health Case Manager Phone: [phone]

## 2024-07-13 LAB — CULTURE, BLOOD (ROUTINE X 2)
Culture: NO GROWTH
Special Requests: ADEQUATE

## 2024-07-13 LAB — CULTURE,AEROBIC BACTERIA WITH GRAM STAIN
MICRO NUMBER:: 17213636
SPECIMEN QUALITY:: ADEQUATE

## 2024-07-14 ENCOUNTER — Inpatient Hospital Stay
Admission: EM | Admit: 2024-07-14 | Discharge: 2024-07-17 | DRG: 638 | Disposition: A | Discharge status: Home / Self Care | Attending: Obstetrics and Gynecology | Admitting: Obstetrics and Gynecology

## 2024-07-14 ENCOUNTER — Emergency Department

## 2024-07-14 ENCOUNTER — Other Ambulatory Visit: Payer: Self-pay

## 2024-07-14 DIAGNOSIS — Z833 Family history of diabetes mellitus: Secondary | ICD-10-CM | POA: Diagnosis not present

## 2024-07-14 DIAGNOSIS — E785 Hyperlipidemia, unspecified: Secondary | ICD-10-CM | POA: Diagnosis not present

## 2024-07-14 DIAGNOSIS — I10 Essential (primary) hypertension: Secondary | ICD-10-CM | POA: Diagnosis present

## 2024-07-14 DIAGNOSIS — L089 Local infection of the skin and subcutaneous tissue, unspecified: Secondary | ICD-10-CM | POA: Diagnosis not present

## 2024-07-14 DIAGNOSIS — Z5941 Food insecurity: Secondary | ICD-10-CM

## 2024-07-14 DIAGNOSIS — Z7982 Long term (current) use of aspirin: Secondary | ICD-10-CM | POA: Diagnosis not present

## 2024-07-14 DIAGNOSIS — Z8616 Personal history of COVID-19: Secondary | ICD-10-CM | POA: Diagnosis not present

## 2024-07-14 DIAGNOSIS — F32A Depression, unspecified: Secondary | ICD-10-CM | POA: Diagnosis present

## 2024-07-14 DIAGNOSIS — E1142 Type 2 diabetes mellitus with diabetic polyneuropathy: Secondary | ICD-10-CM | POA: Diagnosis present

## 2024-07-14 DIAGNOSIS — Z8249 Family history of ischemic heart disease and other diseases of the circulatory system: Secondary | ICD-10-CM

## 2024-07-14 DIAGNOSIS — L03115 Cellulitis of right lower limb: Secondary | ICD-10-CM | POA: Diagnosis present

## 2024-07-14 DIAGNOSIS — E7849 Other hyperlipidemia: Secondary | ICD-10-CM | POA: Diagnosis present

## 2024-07-14 DIAGNOSIS — R5383 Other fatigue: Secondary | ICD-10-CM | POA: Diagnosis present

## 2024-07-14 DIAGNOSIS — E11621 Type 2 diabetes mellitus with foot ulcer: Principal | ICD-10-CM | POA: Diagnosis present

## 2024-07-14 DIAGNOSIS — Z8614 Personal history of Methicillin resistant Staphylococcus aureus infection: Secondary | ICD-10-CM

## 2024-07-14 DIAGNOSIS — B9561 Methicillin susceptible Staphylococcus aureus infection as the cause of diseases classified elsewhere: Secondary | ICD-10-CM | POA: Diagnosis present

## 2024-07-14 DIAGNOSIS — E669 Obesity, unspecified: Secondary | ICD-10-CM | POA: Diagnosis present

## 2024-07-14 DIAGNOSIS — Z794 Long term (current) use of insulin: Secondary | ICD-10-CM | POA: Diagnosis not present

## 2024-07-14 DIAGNOSIS — F419 Anxiety disorder, unspecified: Secondary | ICD-10-CM | POA: Diagnosis present

## 2024-07-14 DIAGNOSIS — F418 Other specified anxiety disorders: Secondary | ICD-10-CM | POA: Diagnosis present

## 2024-07-14 DIAGNOSIS — E08621 Diabetes mellitus due to underlying condition with foot ulcer: Secondary | ICD-10-CM | POA: Diagnosis present

## 2024-07-14 DIAGNOSIS — E1169 Type 2 diabetes mellitus with other specified complication: Secondary | ICD-10-CM | POA: Diagnosis present

## 2024-07-14 DIAGNOSIS — Z823 Family history of stroke: Secondary | ICD-10-CM

## 2024-07-14 DIAGNOSIS — Z6834 Body mass index (BMI) 34.0-34.9, adult: Secondary | ICD-10-CM

## 2024-07-14 DIAGNOSIS — Z89431 Acquired absence of right foot: Secondary | ICD-10-CM

## 2024-07-14 DIAGNOSIS — Z87442 Personal history of urinary calculi: Secondary | ICD-10-CM

## 2024-07-14 DIAGNOSIS — E1140 Type 2 diabetes mellitus with diabetic neuropathy, unspecified: Secondary | ICD-10-CM | POA: Diagnosis not present

## 2024-07-14 DIAGNOSIS — Z89421 Acquired absence of other right toe(s): Secondary | ICD-10-CM | POA: Diagnosis not present

## 2024-07-14 DIAGNOSIS — Z59868 Other specified financial insecurity: Secondary | ICD-10-CM

## 2024-07-14 DIAGNOSIS — Z5948 Other specified lack of adequate food: Secondary | ICD-10-CM

## 2024-07-14 DIAGNOSIS — E11628 Type 2 diabetes mellitus with other skin complications: Secondary | ICD-10-CM | POA: Diagnosis present

## 2024-07-14 DIAGNOSIS — L97512 Non-pressure chronic ulcer of other part of right foot with fat layer exposed: Secondary | ICD-10-CM | POA: Diagnosis present

## 2024-07-14 DIAGNOSIS — Z7984 Long term (current) use of oral hypoglycemic drugs: Secondary | ICD-10-CM

## 2024-07-14 DIAGNOSIS — G4730 Sleep apnea, unspecified: Secondary | ICD-10-CM | POA: Diagnosis present

## 2024-07-14 DIAGNOSIS — Z79899 Other long term (current) drug therapy: Secondary | ICD-10-CM | POA: Diagnosis not present

## 2024-07-14 DIAGNOSIS — Z89411 Acquired absence of right great toe: Secondary | ICD-10-CM | POA: Diagnosis not present

## 2024-07-14 DIAGNOSIS — L97519 Non-pressure chronic ulcer of other part of right foot with unspecified severity: Secondary | ICD-10-CM | POA: Diagnosis not present

## 2024-07-14 LAB — CBC WITH DIFFERENTIAL/PLATELET
Abs Immature Granulocytes: 0.1 10*3/uL — ABNORMAL HIGH (ref 0.00–0.07)
Basophils Absolute: 0.2 10*3/uL — ABNORMAL HIGH (ref 0.0–0.1)
Basophils Relative: 2 %
Eosinophils Absolute: 0.5 10*3/uL (ref 0.0–0.5)
Eosinophils Relative: 5 %
HCT: 44.8 % (ref 39.0–52.0)
Hemoglobin: 14.6 g/dL (ref 13.0–17.0)
Immature Granulocytes: 1 %
Lymphocytes Relative: 17 %
Lymphs Abs: 1.8 10*3/uL (ref 0.7–4.0)
MCH: 28.9 pg (ref 26.0–34.0)
MCHC: 32.6 g/dL (ref 30.0–36.0)
MCV: 88.5 fL (ref 80.0–100.0)
Monocytes Absolute: 1.1 10*3/uL — ABNORMAL HIGH (ref 0.1–1.0)
Monocytes Relative: 11 %
Neutro Abs: 6.5 10*3/uL (ref 1.7–7.7)
Neutrophils Relative %: 64 %
Platelets: 321 10*3/uL (ref 150–400)
RBC: 5.06 MIL/uL (ref 4.22–5.81)
RDW: 15.4 % (ref 11.5–15.5)
WBC: 10.2 10*3/uL (ref 4.0–10.5)
nRBC: 0 % (ref 0.0–0.2)

## 2024-07-14 LAB — BASIC METABOLIC PANEL WITH GFR
Anion gap: 11 (ref 5–15)
BUN: 19 mg/dL (ref 6–20)
CO2: 25 mmol/L (ref 22–32)
Calcium: 9 mg/dL (ref 8.9–10.3)
Chloride: 101 mmol/L (ref 98–111)
Creatinine, Ser: 0.93 mg/dL (ref 0.61–1.24)
GFR, Estimated: >60 mL/min (ref >60)
Glucose, Bld: 260 mg/dL — ABNORMAL HIGH (ref 70–99)
Potassium: 4.4 mmol/L (ref 3.5–5.1)
Sodium: 137 mmol/L (ref 135–145)

## 2024-07-14 LAB — LACTIC ACID, PLASMA
Lactic Acid, Venous: 2.5 mmol/L (ref 0.5–1.9)
Lactic Acid, Venous: 2.5 mmol/L (ref 0.5–1.9)

## 2024-07-14 LAB — GLUCOSE, CAPILLARY: Glucose-Capillary: 194 mg/dL — ABNORMAL HIGH (ref 70–99)

## 2024-07-14 LAB — SEDIMENTATION RATE: Sed Rate: 64 mm/h — ABNORMAL HIGH (ref 0–20)

## 2024-07-14 MED ORDER — OXYCODONE-ACETAMINOPHEN 5-325 MG PO TABS
1.0000 | ORAL_TABLET | Freq: Once | ORAL | Status: AC
  Administered 2024-07-14: 1 via ORAL
  Filled 2024-07-14: qty 1

## 2024-07-14 MED ORDER — INSULIN GLARGINE-YFGN 100 UNIT/ML ~~LOC~~ SOLN
17.0000 [IU] | Freq: Every day | SUBCUTANEOUS | Status: DC
  Administered 2024-07-15 – 2024-07-17 (×3): 17 [IU] via SUBCUTANEOUS
  Filled 2024-07-14 (×3): qty 0.17

## 2024-07-14 MED ORDER — INSULIN ASPART 100 UNIT/ML IJ SOLN: 0.0000 [IU] | Freq: Every day | INTRAMUSCULAR | Status: DC

## 2024-07-14 MED ORDER — PIPERACILLIN-TAZOBACTAM 3.375 G IVPB
3.3750 g | Freq: Once | INTRAVENOUS | Status: AC
  Administered 2024-07-14: 3.375 g via INTRAVENOUS
  Filled 2024-07-14: qty 50

## 2024-07-14 MED ORDER — ATORVASTATIN CALCIUM 20 MG PO TABS
20.0000 mg | ORAL_TABLET | Freq: Every day | ORAL | Status: DC
  Administered 2024-07-14 – 2024-07-16 (×3): 20 mg via ORAL
  Filled 2024-07-14 (×3): qty 1

## 2024-07-14 MED ORDER — OXYCODONE-ACETAMINOPHEN 5-325 MG PO TABS
1.0000 | ORAL_TABLET | ORAL | Status: DC | PRN
  Administered 2024-07-14 – 2024-07-15 (×2): 1 via ORAL
  Filled 2024-07-14 (×2): qty 1

## 2024-07-14 MED ORDER — SODIUM CHLORIDE 0.9 % IV SOLN: INTRAVENOUS | Status: DC

## 2024-07-14 MED ORDER — PIPERACILLIN-TAZOBACTAM 3.375 G IVPB
3.3750 g | Freq: Three times a day (TID) | INTRAVENOUS | Status: DC
  Administered 2024-07-15 – 2024-07-16 (×4): 3.375 g via INTRAVENOUS
  Filled 2024-07-14 (×5): qty 50

## 2024-07-14 MED ORDER — ADULT MULTIVITAMIN W/MINERALS CH
1.0000 | ORAL_TABLET | Freq: Every day | ORAL | Status: DC
  Administered 2024-07-14 – 2024-07-17 (×4): 1 via ORAL
  Filled 2024-07-14 (×4): qty 1

## 2024-07-14 MED ORDER — ASPIRIN 81 MG PO TBEC
81.0000 mg | DELAYED_RELEASE_TABLET | Freq: Every day | ORAL | Status: DC
  Administered 2024-07-14 – 2024-07-17 (×4): 81 mg via ORAL
  Filled 2024-07-14 (×4): qty 1

## 2024-07-14 MED ORDER — SODIUM CHLORIDE 0.9 % IV BOLUS
1000.0000 mL | Freq: Once | INTRAVENOUS | Status: AC
  Administered 2024-07-14: 1000 mL via INTRAVENOUS

## 2024-07-14 MED ORDER — VANCOMYCIN HCL IN DEXTROSE 1-5 GM/200ML-% IV SOLN
1000.0000 mg | Freq: Once | INTRAVENOUS | Status: AC
  Administered 2024-07-14: 1000 mg via INTRAVENOUS

## 2024-07-14 MED ORDER — VANCOMYCIN HCL 1500 MG/300ML IV SOLN
1500.0000 mg | Freq: Once | INTRAVENOUS | Status: AC
  Administered 2024-07-14: 1500 mg via INTRAVENOUS
  Filled 2024-07-14: qty 300

## 2024-07-14 MED ORDER — IRBESARTAN 75 MG PO TABS
75.0000 mg | ORAL_TABLET | Freq: Every day | ORAL | Status: DC
  Administered 2024-07-14 – 2024-07-17 (×4): 75 mg via ORAL
  Filled 2024-07-14 (×4): qty 1

## 2024-07-14 MED ORDER — SODIUM CHLORIDE 0.9 % IV BOLUS: 1000.0000 mL | Freq: Once | INTRAVENOUS | Status: DC

## 2024-07-14 MED ORDER — ONDANSETRON HCL 4 MG/2ML IJ SOLN
4.0000 mg | Freq: Three times a day (TID) | INTRAMUSCULAR | Status: DC | PRN
  Administered 2024-07-15: 4 mg via INTRAVENOUS
  Filled 2024-07-14: qty 2

## 2024-07-14 MED ORDER — HEPARIN SODIUM (PORCINE) 5000 UNIT/ML IJ SOLN
5000.0000 [IU] | Freq: Three times a day (TID) | INTRAMUSCULAR | Status: DC
  Administered 2024-07-14 – 2024-07-16 (×5): 5000 [IU] via SUBCUTANEOUS
  Filled 2024-07-14 (×5): qty 1

## 2024-07-14 MED ORDER — ACETAMINOPHEN 325 MG PO TABS: 650.0000 mg | ORAL_TABLET | Freq: Four times a day (QID) | ORAL | Status: DC | PRN

## 2024-07-14 MED ORDER — VANCOMYCIN HCL IN DEXTROSE 1-5 GM/200ML-% IV SOLN
1000.0000 mg | Freq: Once | INTRAVENOUS | Status: DC
  Filled 2024-07-14: qty 200

## 2024-07-14 MED ORDER — INSULIN GLARGINE (2 UNIT DIAL) 300 UNIT/ML ~~LOC~~ SOPN: 17.0000 [IU] | PEN_INJECTOR | Freq: Every day | SUBCUTANEOUS | Status: DC

## 2024-07-14 MED ORDER — TAMSULOSIN HCL 0.4 MG PO CAPS
0.4000 mg | ORAL_CAPSULE | Freq: Every day | ORAL | Status: DC
  Administered 2024-07-14 – 2024-07-17 (×4): 0.4 mg via ORAL
  Filled 2024-07-14 (×4): qty 1

## 2024-07-14 MED ORDER — DULOXETINE HCL 60 MG PO CPEP
60.0000 mg | ORAL_CAPSULE | Freq: Every day | ORAL | Status: DC
  Administered 2024-07-14 – 2024-07-17 (×4): 60 mg via ORAL
  Filled 2024-07-14 (×2): qty 1
  Filled 2024-07-14 (×2): qty 2
  Filled 2024-07-14: qty 1
  Filled 2024-07-14: qty 2
  Filled 2024-07-14: qty 1
  Filled 2024-07-14: qty 2

## 2024-07-14 MED ORDER — INSULIN ASPART 100 UNIT/ML IJ SOLN
0.0000 [IU] | Freq: Three times a day (TID) | INTRAMUSCULAR | Status: DC
  Administered 2024-07-15: 1 [IU] via SUBCUTANEOUS
  Administered 2024-07-15 – 2024-07-17 (×6): 2 [IU] via SUBCUTANEOUS
  Filled 2024-07-14 (×3): qty 2
  Filled 2024-07-14: qty 1
  Filled 2024-07-14 (×3): qty 2

## 2024-07-14 MED ORDER — DIPHENHYDRAMINE HCL 50 MG/ML IJ SOLN
25.0000 mg | Freq: Once | INTRAMUSCULAR | Status: AC
  Administered 2024-07-14: 25 mg via INTRAVENOUS
  Filled 2024-07-14: qty 1

## 2024-07-14 MED ORDER — HYDRALAZINE HCL 20 MG/ML IJ SOLN: 5.0000 mg | INTRAMUSCULAR | Status: DC | PRN

## 2024-07-14 MED ORDER — VANCOMYCIN HCL IN DEXTROSE 1-5 GM/200ML-% IV SOLN
1000.0000 mg | Freq: Two times a day (BID) | INTRAVENOUS | Status: DC
  Administered 2024-07-15 – 2024-07-16 (×3): 1000 mg via INTRAVENOUS
  Filled 2024-07-14 (×3): qty 200

## 2024-07-14 MED ORDER — MORPHINE SULFATE (PF) 2 MG/ML IV SOLN
2.0000 mg | INTRAVENOUS | Status: DC | PRN
  Administered 2024-07-14 – 2024-07-17 (×2): 2 mg via INTRAVENOUS
  Filled 2024-07-14 (×2): qty 1

## 2024-07-14 MED ORDER — SODIUM CHLORIDE 0.9 % IV BOLUS
1500.0000 mL | Freq: Once | INTRAVENOUS | Status: AC
  Administered 2024-07-14: 1500 mL via INTRAVENOUS

## 2024-07-14 NOTE — H&P (Signed)
 History and Physical    [name] FMW:[id number] DOB: [birth year] DOA: 07/14/2024  Referring MD/NP/PA:   PCP: Marylynn Verneita CROME, MD   Patient coming from:  The patient is coming from home.     Chief Complaint: right foot pain  HPI: [name] is a 58 y.o. 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  Pt was recently hospitalized from a 11/10 - 11/12 due to infected neuropathic ulcer of right foot with fat layer exposed. MRI showed mild marrow edema likely reactive, but early osteomyelitis cannot be ruled out.  Podiatry was consulted. Bedside debridement was performed by Dr. Tanda 11/11. Pt was treated with was treated with vancomycin  and Zosyn in hospital and discharged on doxycyclines and Augmentin . Pt states that he is taking antibiotics consistently, but his right foot pain, swelling and erythema have been progressively worsening.  No fever or chills.  His right foot pain is intermittent, sharp, 8 out of 10 in severity, nonradiating, not aggravated or alleviated by any known factors.  Patient does not have chest pain, cough, SOB. No nausea, vomiting, diarrhea or abdominal pain. No symptoms of UTI.   MRI-right foot on 07/08/24 1. Progressive soft tissue ulceration along the inferior medial aspect of the midfoot and remaining forefoot with surrounding cellulitis. No focal fluid collection or definite soft tissue emphysema. 2. Interval improvement in previously demonstrated marrow edema and enhancement in the medial forefoot, involving the navicular, cuneiform bones and bases of the metatarsals. There is new marrow edema and low level enhancement within the cuboid. These findings are nonspecific and could be reactive or secondary to early osteomyelitis. 3. No evidence of septic arthritis or septic  tenosynovitis.    Data reviewed independently and ED Course: pt was found to have WBC 10.2, lactic acid 2.5, temperature normal, blood pressure 127/78, heart rate 113, RR 18, oxygen saturation 100% on room air.  Patient is admitted to MedSurg bed as inpatient. Consulted Dr. Tanda for podiatry  X-ray of right foot:  No radiographic findings of osteomyelitis or subcutaneous emphysema.    EKG: Not done in ED, will get one.     Review of Systems:   General: no fevers, chills, no body weight gain, has fatigue HEENT: no blurry vision, hearing changes or sore throat Respiratory: no dyspnea, coughing, wheezing CV: no chest pain, no palpitations GI: no nausea, vomiting, abdominal pain, diarrhea, constipation GU: no dysuria, burning on urination, increased urinary frequency, hematuria  Ext: no leg edema Neuro: no unilateral weakness, numbness, or tingling, no vision change or hearing loss Skin: has right foot wound MSK: No muscle spasm, no deformity, no limitation of range of movement in spin Heme: No easy bruising.  Travel history: No recent long distant travel.   Allergy: No Known Allergies  Past Medical History:  Diagnosis Date   Acute prostatitis without hematuria 05/20/2022   Allergy    Anxiety    Atypical pneumonia 12/18/2022   Complication of anesthesia    pt states at [facility] he stopped breathing due to his sleep apnea-hard to wake up   COVID-19    Depression    Diabetic ulcer of both feet (HCC)    DM (diabetes mellitus), type 2 (HCC)    History of kidney stones    History of methicillin resistant staphylococcus aureus (MRSA) 09/2020   foot   Neuromuscular  disorder (HCC)    neuropathy of back and bilateral feet   Obesity    Sleep apnea    does not use cpap    Past Surgical History:  Procedure Laterality Date   AMPUTATION Left    toe amputation   CYSTOSCOPY/URETEROSCOPY/HOLMIUM LASER/STENT PLACEMENT Right 07/29/2022   Procedure: CYSTOSCOPY/URETEROSCOPY/HOLMIUM  LASER/STENT PLACEMENT;  Surgeon: Francisca Redell BROCKS, MD;  Location: [facility];  Service: Urology;  Laterality: Right;   EXTRACORPOREAL SHOCK WAVE LITHOTRIPSY Left 02/08/2024   Procedure: LITHOTRIPSY, ESWL;  Surgeon: Twylla Glendia BROCKS, MD;  Location: [facility];  Service: Urology;  Laterality: Left;   EXTRACORPOREAL SHOCK WAVE LITHOTRIPSY Left 02/15/2024   Procedure: LITHOTRIPSY, ESWL;  Surgeon: Penne Knee, MD;  Location: [facility];  Service: Urology;  Laterality: Left;   FOOT SURGERY Right 09/2012   cyst removed   LOWER EXTREMITY ANGIOGRAPHY Right 07/12/2023   Procedure: Lower Extremity Angiography;  Surgeon: Marea Selinda RAMAN, MD;  Location: [facility];  Service: Cardiovascular;  Laterality: Right;   SPINE SURGERY  2004   nerve damage in spine   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 He has never been exposed to tobacco smoke. He has never used smokeless tobacco. He reports that he does not drink alcohol and does not use drugs.  Family History:  Family History  Problem Relation Age of Onset   Heart disease Father    Stroke Father    Heart attack Father 64   Diabetes Father    Heart disease Maternal Grandmother    Stroke Maternal Grandmother    Heart disease Paternal Grandfather    Stroke Paternal Grandfather      Prior to Admission medications   Medication Sig Start Date End Date Taking? Authorizing Provider  aspirin  EC 81 MG tablet Take 81 mg by mouth daily. Swallow whole.    [provider]  atorvastatin  (LIPITOR) 20 MG tablet Take 1 tablet (20 mg total) by mouth daily. 06/11/24   Marylynn Verneita CROME, MD  ciprofloxacin  (CIPRO ) 500 MG tablet Take 1 tablet (500 mg total) by mouth 2 (two) times daily for 14 days. 07/12/24 07/26/24  Corwin Antu, FNP  doxycycline  (VIBRA -TABS) 100 MG tablet Take 1 tablet (100 mg total) by mouth 2 (two) times daily for 10 days. 07/08/24 07/18/24  Corwin Antu, FNP  DULoxetine   (CYMBALTA ) 60 MG capsule TAKE 1 CAPSULE BY MOUTH EVERY DAY 02/05/24   Marylynn Verneita CROME, MD  insulin  glargine, 2 Unit Dial, (TOUJEO  MAX SOLOSTAR) 300 UNIT/ML Solostar Pen Inject 20 Units into the skin daily. 06/11/24   Marylynn Verneita CROME, MD  Insulin  Pen Needle (PEN NEEDLES) 32G X 6 MM MISC Use to give insulin  once daily. 12/05/23   Marylynn Verneita CROME, MD  metFORMIN  (GLUCOPHAGE -XR) 500 MG 24 hr tablet TAKE 1 TABLET BY MOUTH EVERY DAY WITH BREAKFAST 06/11/24   Tullo, Teresa L, MD  Multiple Vitamins-Minerals (MULTIVITAMIN WITH MINERALS) tablet Take 1 tablet by mouth daily.    [provider]  oxyCODONE  (ROXICODONE ) 5 MG immediate release tablet Take 1.5 tablets (7.5 mg total) by mouth daily. As needed for severe pain 06/11/24   Marylynn Verneita CROME, MD  SODIUM FLUORIDE 5000 SENSITIVE 1.1-5 % GEL Brush as directed by your provider. 06/20/23   [provider]  tamsulosin  (FLOMAX ) 0.4 MG CAPS capsule Take 1 capsule (0.4 mg total) by mouth daily. 05/03/24   Vaillancourt, Samantha, PA-C  telmisartan  (MICARDIS ) 20 MG  tablet Take 1 tablet (20 mg total) by mouth daily. TAKE 1 TABLET BY MOUTH EVERYDAY AT BEDTIME 06/11/24   Marylynn Verneita CROME, MD    Physical Exam: Vitals:   07/14/24 1807 07/14/24 1808 07/14/24 1909 07/14/24 2117  BP: 127/78   (!) 143/70  Pulse: (!) 113   87  Resp: 18   17  Temp: 98.6 F (37 C)   98.1 F (36.7 C)  TempSrc:    Oral  SpO2: 98%  100% 98%  Weight:  105.4 kg    Height:  5' 9 (1.753 m)     General: Not in acute distress HEENT:       Eyes: PERRL, EOMI, no jaundice       ENT: No discharge from the ears and nose, no pharynx injection, no tonsillar enlargement.        Neck: No JVD, no bruit, no mass felt. Heme: No neck lymph node enlargement. Cardiac: S1/S2, RRR, No murmurs, No gallops or rubs. Respiratory: No rales, wheezing, rhonchi or rubs. GI: Soft, nondistended, nontender, no rebound pain, no organomegaly, BS present. GU: No hematuria Ext: No pitting leg edema  bilaterally. 1+DP/PT pulse bilaterally. Musculoskeletal: No joint deformities, No joint redness or warmth, no limitation of ROM in spin. Skin: s/p of right TMA stump. Has a large plantar wound with surrounding erythema, tenderness and warmth       Neuro: Alert, oriented X3, cranial nerves II-XII grossly intact, moves all extremities normally.  Psych: Patient is not psychotic, no suicidal or hemocidal ideation.  Labs on Admission: I have personally reviewed following labs and imaging studies  CBC: Recent Labs  Lab 07/08/24 0941 07/08/24 1702 07/09/24 1325 07/14/24 1832  WBC 8.8 10.7* 8.3 10.2  NEUTROABS 5.5 6.8  --  6.5  HGB 13.3 13.4 13.2 14.6  HCT 40.5 40.8 41.5 44.8  MCV 86.5 86.6 88.3 88.5  PLT 234.0 252 251 321   Basic Metabolic Panel: Recent Labs  Lab 07/08/24 1702 07/10/24 0423 07/10/24 0805 07/14/24 1832  NA 136  --  138 137  K 4.0  --  4.5 4.4  CL 101  --  102 101  CO2 24  --  26 25  GLUCOSE 233*  --  131* 260*  BUN 14  --  10 19  CREATININE 0.87 0.86 0.79 0.93  CALCIUM  8.3*  --  8.7* 9.0   GFR: Estimated Creatinine Clearance: 102.3 mL/min (by C-G formula based on SCr of 0.93 mg/dL). Liver Function Tests: Recent Labs  Lab 07/08/24 1702  AST 31  ALT 29  ALKPHOS 46  BILITOT 0.7  PROT 7.8  ALBUMIN 3.4*   No results for input(s): LIPASE, AMYLASE in the last 168 hours. No results for input(s): AMMONIA in the last 168 hours. Coagulation Profile: No results for input(s): INR, PROTIME in the last 168 hours. Cardiac Enzymes: No results for input(s): CKTOTAL, CKMB, CKMBINDEX, TROPONINI in the last 168 hours. BNP (last 3 results) No results for input(s): PROBNP in the last 8760 hours. HbA1C: No results for input(s): HGBA1C in the last 72 hours. CBG: Recent Labs  Lab 07/10/24 0044 07/10/24 0414 07/10/24 0733 07/10/24 1143 07/10/24 1533  GLUCAP 139* 127* 138* 173* 152*   Lipid Profile: No results for input(s): CHOL,  HDL, LDLCALC, TRIG, CHOLHDL, LDLDIRECT in the last 72 hours. Thyroid  Function Tests: No results for input(s): TSH, T4TOTAL, FREET4, T3FREE, THYROIDAB in the last 72 hours. Anemia Panel: No results for input(s): VITAMINB12, FOLATE, FERRITIN, TIBC, IRON, RETICCTPCT in the  last 72 hours. Urine analysis:    Component Value Date/Time   COLORURINE YELLOW (A) 03/30/2024 1247   APPEARANCEUR CLEAR (A) 03/30/2024 1247   APPEARANCEUR Clear 02/01/2024 1417   LABSPEC 1.013 03/30/2024 1247   PHURINE 5.0 03/30/2024 1247   GLUCOSEU NEGATIVE 03/30/2024 1247   GLUCOSEU NEGATIVE 09/21/2022 0736   HGBUR SMALL (A) 03/30/2024 1247   BILIRUBINUR NEGATIVE 03/30/2024 1247   BILIRUBINUR Negative 02/01/2024 1417   KETONESUR NEGATIVE 03/30/2024 1247   PROTEINUR NEGATIVE 03/30/2024 1247   UROBILINOGEN 0.2 09/21/2022 0736   NITRITE NEGATIVE 03/30/2024 1247   LEUKOCYTESUR NEGATIVE 03/30/2024 1247   Sepsis Labs: @LABRCNTIP (procalcitonin:4,lacticidven:4) ) Recent Results (from the past 240 hours)  WOUND CULTURE     Status: None (Preliminary result)   Collection Time: 07/08/24  9:47 AM   Specimen: Wound  Result Value Ref Range Status   MICRO NUMBER: [id number]  Preliminary   SPECIMEN QUALITY: Adequate  Preliminary   SOURCE: LEFT ULCER  Preliminary   STATUS: PRELIMINARY  Preliminary   GRAM STAIN:   Preliminary    No white blood cells seen No epithelial cells seen No organisms seen  Culture aerobic bacteria pos     Status: Abnormal   Collection Time: 07/08/24  9:47 AM  Result Value Ref Range Status   MICRO NUMBER: [id number]  Final   SPECIMEN QUALITY: Adequate  Final   SOURCE: LEFT ULCER  Final   STATUS: FINAL  Final   GRAM STAIN:   Final    No white blood cells seen No epithelial cells seen No organisms seen   ISOLATE 1: Providencia rettgeri (A)  Final    Comment: Light growth of Providencia rettgeri   ISOLATE 2: Staphylococcus aureus (A)  Final    Comment: Heavy growth of  Staphylococcus aureus      Susceptibility   Providencia rettgeri - AEROBIC CULT, GRAM STAIN NEGATIVE 1    AMPICILLIN /SULBACTAM <=2 Sensitive     CEFAZOLIN * >=32 Resistant      * For infections other than uncomplicated UTI caused by E. coli, K. pneumoniae or P. mirabilis: Cefazolin  is resistant if MIC > or = 8 mcg/mL. (Distinguishing susceptible versus intermediate for isolates with MIC < or = 4 mcg/mL requires additional testing.)     CEFTAZIDIME <=0.5 Sensitive     CEFEPIME  <=0.12 Sensitive     CEFTRIAXONE  <=0.25 Sensitive     CIPROFLOXACIN  <=0.06 Sensitive     LEVOFLOXACIN  <=0.12 Sensitive     GENTAMICIN <=1 Sensitive     MEROPENEM <=0.25 Sensitive     PIP/TAZO <=4 Sensitive     TRIMETH /SULFA  >=320 Resistant    Staphylococcus aureus - AEROBIC CULT, GRAM STAIN POSITIVE 2    VANCOMYCIN  <=0.5 Sensitive     CLINDAMYCIN <=0.25 Sensitive     ERYTHROMYCIN <=0.25 Sensitive     OXACILLIN* 0.5 Sensitive      * For infections other than uncomplicated UTI caused by E. coli, K. pneumoniae or P. mirabilis: Cefazolin  is resistant if MIC > or = 8 mcg/mL. (Distinguishing susceptible versus intermediate for isolates with MIC < or = 4 mcg/mL requires additional testing.) Oxacillin susceptible staphylococci are susceptible to other penicillinase-stable penicillins (e.g., methicillin, nafcillin), beta- lactam/beta-lactamase inhibitor combinations, and cephems with staphylococcal indications, including cefazolin .     CIPROFLOXACIN  <=0.5 Sensitive     LEVOFLOXACIN  0.25 Sensitive     GENTAMICIN <=0.5 Sensitive     TETRACYCLINE <=1 Sensitive     TRIMETH /SULFA  <=10 Sensitive     MOXIFLOXACIN* <=0.25 Sensitive      *  For infections other than uncomplicated UTI caused by E. coli, K. pneumoniae or P. mirabilis: Cefazolin  is resistant if MIC > or = 8 mcg/mL. (Distinguishing susceptible versus intermediate for isolates with MIC < or = 4 mcg/mL requires additional testing.) Oxacillin susceptible  staphylococci are susceptible to other penicillinase-stable penicillins (e.g., methicillin, nafcillin), beta- lactam/beta-lactamase inhibitor combinations, and cephems with staphylococcal indications, including cefazolin . Legend: S = Susceptible  I = Intermediate R = Resistant  NS = Not susceptible SDD = Susceptible Dose Dependent * = Not Tested  NR = Not Reported **NN = See Therapy Comments   Blood culture (routine x 2)     Status: None   Collection Time: 07/08/24  7:30 PM   Specimen: BLOOD  Result Value Ref Range Status   Specimen Description BLOOD LEFT ANTECUBITAL  Final   Special Requests   Final    BOTTLES DRAWN AEROBIC AND ANAEROBIC Blood Culture adequate volume   Culture   Final    NO GROWTH 5 DAYS Performed at [facility], [address], [place], [place] [address]    Report Status 07/13/2024 FINAL  Final     Radiological Exams on Admission:   Assessment/Plan Principal Problem:   Diabetic foot infection (HCC) Active Problems:   Diabetic ulcer of foot associated with diabetes mellitus due to underlying condition, with fat layer exposed (HCC)   Type 2 diabetes mellitus with peripheral neuropathy (HCC)   HTN (hypertension)   Hyperlipidemia associated with type 2 diabetes mellitus (HCC)   Depression with anxiety   Obesity (BMI 30-39.9)   Assessment and Plan:  Diabetic foot infection and diabetic ulcer of foot associated with diabetes mellitus due to underlying condition, with fat layer exposed: s/p of debridement in recent admission.  Patient is currently taking doxycycline  and Augmentin , but symptoms have been progressively worsening.  Patient reports worsening right foot pain, erythema and swelling.  Lactic acid is elevated 2.5, but no fever or leukocytosis, does not meet criteria for sepsis (heart rate of 113, RR 18, temperature 98.6, WBC 10.2). Recent MRI findings on 07/09/2024 cannot completely rule out early stage of osteomyelitis.  Consulted Dr.  Tanda of podiatry.  - will admit to med-surg bed as inpatient - Empiric antimicrobial treatment with vancomycin  and zosyn - PRN Zofran  for nausea, and Tylenol , morphine  and Percocet for pain - Blood cultures x 2  - ESR and CRP - wound care consult - trend lactic acid levels per sepsis protocol. - IVF: 2.5 L of NS bolus in ED, followed by 75 cc/h    Type 2 diabetes mellitus with peripheral neuropathy [facility]): Recent A1c 8.1, poorly controlled.  Patient taking metformin  and Toujeo  insulin  20 units daily - SSI - Glargine insulin  17 units daily  HTN (hypertension) -Switch Micardis  to irbesartan  Hospital - IV hydralazine as needed  Hyperlipidemia associated with type 2 diabetes mellitus (HCC) -Lipitor  Depression with anxiety -Cymbalta   Obesity (BMI 30-39.9): Patient has Obesity Class 105.4, with body weight  Kg and BMI 34.32  kg/m2.  - Encourage losing weight - Exercise and healthy diet          DVT ppx: SQ Heparin     Code Status: Full code   Family Communication:     not done, no family member is at bed side.   Disposition Plan:  Anticipate discharge back to previous environment  Consults called: Consulted Dr. Tanda for podiatry  Admission status and Level of care: Med-Surg:    for obs as inpt  Dispo: The patient is from: Home              Anticipated d/c is to: Home              Anticipated d/c date is: 2 days              Patient currently is not medically stable to d/c.    Severity of Illness:  The appropriate patient status for this patient is INPATIENT. Inpatient status is judged to be reasonable and necessary in order to provide the required intensity of service to ensure the patient's safety. The patient's presenting symptoms, physical exam findings, and initial radiographic and laboratory data in the context of their chronic comorbidities is felt to place them at high risk for further clinical deterioration. Furthermore, it is not anticipated  that the patient will be medically stable for discharge from the hospital within 2 midnights of admission.   * I certify that at the point of admission it is my clinical judgment that the patient will require inpatient hospital care spanning beyond 2 midnights from the point of admission due to high intensity of service, high risk for further deterioration and high frequency of surveillance required.*       Date of Service 07/14/2024    Caleb Exon [facility]   If 7PM-7AM, please contact night-coverage [email or web address] 07/14/2024, 9:38 PM

## 2024-07-14 NOTE — Consult Note (Signed)
 PODIATRIC SURGERY: CONSULT NOTE  Consulting Physician: Dr. Hilma  Reason for Consult: DFI cellulitis, in setting of RIGHT foot chronic ulceration.   HPI: [name] is a 58 y.o. male with PMH for:     Seen in hospital under recent admission for acute on chronic wound infection.  He was administered IV ABX and discharged home with PO ABX and close clinic follow up.    Podiatry consulted for evaluation of wound / infection RIGHT foot.   Denies: F/C/N/V  SOB/CP/calf pain.  Last PO: last night 07/08/2024   PMHx:  Past Medical History:  Diagnosis Date   Acute prostatitis without hematuria 05/20/2022   Allergy    Anxiety    Atypical pneumonia 12/18/2022   Complication of anesthesia    pt states at [facility] he stopped breathing due to his sleep apnea-hard to wake up   COVID-19    Depression    Diabetic ulcer of both feet (HCC)    DM (diabetes mellitus), type 2 (HCC)    History of kidney stones    History of methicillin resistant staphylococcus aureus (MRSA) 09/2020   foot   Neuromuscular disorder (HCC)    neuropathy of back and bilateral feet   Obesity    Sleep apnea    does not use cpap    Surgical Hx:  Past Surgical History:  Procedure Laterality Date   AMPUTATION Left    toe amputation   CYSTOSCOPY/URETEROSCOPY/HOLMIUM LASER/STENT PLACEMENT Right 07/29/2022   Procedure: CYSTOSCOPY/URETEROSCOPY/HOLMIUM LASER/STENT PLACEMENT;  Surgeon: Francisca Redell JAYSON, MD;  Location: [facility];  Service: Urology;  Laterality: Right;   EXTRACORPOREAL SHOCK WAVE LITHOTRIPSY Left 02/08/2024   Procedure: LITHOTRIPSY, ESWL;  Surgeon: Twylla Glendia JAYSON, MD;  Location: [facility];  Service: Urology;  Laterality: Left;   EXTRACORPOREAL SHOCK WAVE LITHOTRIPSY Left 02/15/2024   Procedure: LITHOTRIPSY, ESWL;  Surgeon: Penne Knee, MD;  Location: [facility];  Service: Urology;  Laterality: Left;   FOOT SURGERY Right 09/2012   cyst removed   LOWER EXTREMITY ANGIOGRAPHY Right 07/12/2023   Procedure:  Lower Extremity Angiography;  Surgeon: Marea Selinda RAMAN, MD;  Location: [facility];  Service: Cardiovascular;  Laterality: Right;   SPINE SURGERY  2004   nerve damage in spine   TOE AMPUTATION Right 06/30/2016   5 toes    FHx:  Family History  Problem Relation Age of Onset   Heart disease Father    Stroke Father    Heart attack Father 32   Diabetes Father    Heart disease Maternal Grandmother    Stroke Maternal Grandmother    Heart disease Paternal Grandfather    Stroke Paternal Grandfather     Social History:  reports that he has never smoked. He has never been exposed to tobacco smoke. He has never used smokeless tobacco. He reports that he does not drink alcohol and does not use drugs.  Allergies: No Known Allergies  Medications Prior to Admission  Medication Sig Dispense Refill   amoxicillin -clavulanate (AUGMENTIN ) 875-125 MG tablet Take 1 tablet by mouth 2 (two) times daily.     aspirin  EC 81 MG tablet Take 81 mg by mouth daily. Swallow whole.     atorvastatin  (LIPITOR) 20 MG tablet Take 1 tablet (20 mg total) by mouth daily. 90 tablet 3   doxycycline  (VIBRA -TABS) 100 MG tablet Take 1 tablet (100 mg total) by mouth 2 (two) times daily for 10 days. 20 tablet 0   DULoxetine  (CYMBALTA ) 60 MG capsule TAKE 1 CAPSULE BY  MOUTH EVERY DAY 90 capsule 2   insulin  glargine, 2 Unit Dial, (TOUJEO  MAX SOLOSTAR) 300 UNIT/ML Solostar Pen Inject 20 Units into the skin daily. 15 mL 1   metFORMIN  (GLUCOPHAGE -XR) 500 MG 24 hr tablet TAKE 1 TABLET BY MOUTH EVERY DAY WITH BREAKFAST 90 tablet 1   Multiple Vitamins-Minerals (MULTIVITAMIN WITH MINERALS) tablet Take 1 tablet by mouth daily.     oxyCODONE  (ROXICODONE ) 5 MG immediate release tablet Take 1.5 tablets (7.5 mg total) by mouth daily. As needed for severe pain 45 tablet 0   SODIUM FLUORIDE 5000 SENSITIVE 1.1-5 % GEL Brush as directed by your provider.     tamsulosin  (FLOMAX ) 0.4 MG CAPS capsule Take 1 capsule (0.4 mg total) by mouth  daily. 30 capsule 0   telmisartan  (MICARDIS ) 20 MG tablet Take 1 tablet (20 mg total) by mouth daily. TAKE 1 TABLET BY MOUTH EVERYDAY AT BEDTIME 90 tablet 1   ciprofloxacin  (CIPRO ) 500 MG tablet Take 1 tablet (500 mg total) by mouth 2 (two) times daily for 14 days. (Patient not taking: Reported on 07/14/2024) 28 tablet 0   Insulin  Pen Needle (PEN NEEDLES) 32G X 6 MM MISC Use to give insulin  once daily. 100 each 3    Physical Exam: Blood pressure (!) 91/59, pulse 85, temperature 97.7 F (36.5 C), resp. rate 16, height 5' 9 (1.753 m), weight 105.4 kg, SpO2 99%.  Constitutional: Patient appears of normal development and good nutritional status for stated age, well-groomed, and of normal body habitus. Phonating appropriately.   Psychiatric: Alert and oriented to time, place, person and situation. The patient is noted to have good judgment and insight into the hospital visit  FOCUSED LOWER EXTREMITY EXAMINATION: RIGHT FOOT   Neurological:  - Protective sensation absent - Gross protective sensation diminished  - No focal motor or sensory deficits identified bilaterally.  - Able to DF/Evert foot (prior h/o drop foot?)  Vascular:  - Dorsalis Pedis artery on palpation: 2 - Posterior Tibial artery on palpation: 1 - TMA flap refill time - WNL  - Peripheral edema: 1+ - Dependency Rubor: yes   Musculoskeletal:  S/p TMA  Muscle strength: 5/5 in all 4 quadrants  Ankle Joint ROM: decreased, equinus noted Global foot - TTP: none  Dermatological:  - Skin quality: atrophic  #WOUND 1   Type: Full thickness neuropathic ulceration --- Wound base: 10% Granular ; 80% Fibrosis ; 10% Necrotic  Adjacent tissue/ Wound borders: hyperkeratotic Undermining noted (Pre-debridement): none ; Direction: N/A  PTB: no Malodor: yes ; mild Active Drainage: no; If yes, consistency: n/a  Pre Debridement Measurements: 5.0cm (l) x 4.7cm (w) x 0.4cm (d)  Post Debridement Measurements: 5.5cm (l) x 5.0cm (w) x  0.5cm (d)  Methods for Debridement: Sharp (#15/10 blade), Mechanical (Tissue nipper / Curette) Cautery needed(?): no  Debridement conducted without incident by above methods to granular wound base tissue with necrotic / fibrotic / biofilm excised by above described methods.        Results for orders placed or performed during the hospital encounter of 07/14/24 (from the past 48 hours)  Lactic acid, plasma     Status: Abnormal   Collection Time: 07/14/24  6:32 PM  Result Value Ref Range   Lactic Acid, Venous 2.5 (HH) 0.5 - 1.9 mmol/L    Comment: Critical Value, Read Back and verified with POWELL RUTH, RN @2008  07/14/2024 COP Performed at [facility], [address] [place], [place] [address]   Basic metabolic panel  Status: Abnormal   Collection Time: 07/14/24  6:32 PM  Result Value Ref Range   Sodium 137 135 - 145 mmol/L   Potassium 4.4 3.5 - 5.1 mmol/L   Chloride 101 98 - 111 mmol/L   CO2 25 22 - 32 mmol/L   Glucose, Bld 260 (H) 70 - 99 mg/dL    Comment: Glucose reference range applies only to samples taken after fasting for at least 8 hours.   BUN 19 6 - 20 mg/dL   Creatinine, Ser 9.06 0.61 - 1.24 mg/dL   Calcium  9.0 8.9 - 10.3 mg/dL   GFR, Estimated >39 >39 mL/min    Comment: (NOTE) Calculated using the CKD-EPI Creatinine Equation (2021)    Anion gap 11 5 - 15    Comment: Performed at [facility], [address] [place], [place] [address]  CBC with Differential     Status: Abnormal   Collection Time: 07/14/24  6:32 PM  Result Value Ref Range   WBC 10.2 4.0 - 10.5 K/uL   RBC 5.06 4.22 - 5.81 MIL/uL   Hemoglobin 14.6 13.0 - 17.0 g/dL   HCT 55.1 60.9 - 47.9 %   MCV 88.5 80.0 - 100.0 fL   MCH 28.9 26.0 - 34.0 pg   MCHC 32.6 30.0 - 36.0 g/dL   RDW 84.5 88.4 - 84.4 %   Platelets 321 150 - 400 K/uL   nRBC 0.0 0.0 - 0.2 %   Neutrophils Relative % 64 %   Neutro Abs 6.5 1.7 - 7.7 K/uL   Lymphocytes Relative 17 %   Lymphs Abs 1.8 0.7 - 4.0 K/uL    Monocytes Relative 11 %   Monocytes Absolute 1.1 (H) 0.1 - 1.0 K/uL   Eosinophils Relative 5 %   Eosinophils Absolute 0.5 0.0 - 0.5 K/uL   Basophils Relative 2 %   Basophils Absolute 0.2 (H) 0.0 - 0.1 K/uL   Immature Granulocytes 1 %   Abs Immature Granulocytes 0.10 (H) 0.00 - 0.07 K/uL    Comment: Performed at [facility], [address] [place], [place] [address]  Sedimentation rate     Status: Abnormal   Collection Time: 07/14/24  6:32 PM  Result Value Ref Range   Sed Rate 64 (H) 0 - 20 mm/hr    Comment: Performed at [facility], [address] [place], [place] [address]  Lactic acid, plasma     Status: Abnormal   Collection Time: 07/14/24  8:21 PM  Result Value Ref Range   Lactic Acid, Venous 2.5 (HH) 0.5 - 1.9 mmol/L    Comment: Critical value noted. Value is consistent with previously reported and called value  Performed at [facility], [address] [place], [place] [address]   Glucose, capillary     Status: Abnormal   Collection Time: 07/14/24  9:45 PM  Result Value Ref Range   Glucose-Capillary 194 (H) 70 - 99 mg/dL    Comment: Glucose reference range applies only to samples taken after fasting for at least 8 hours.   DG Foot Complete Right Result Date: 07/14/2024 CLINICAL DATA:  Cellulitis.  Right foot pain and infection. EXAM: RIGHT FOOT COMPLETE - 3+ VIEW COMPARISON:  07/08/2024. 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 No acute fracture or dislocation is seen. Degenerative changes are noted in the midfoot and hindfoot. There is moderate calcaneal spurring. No periosteal elevation or bony erosion is seen. Diffuse soft tissue swelling is noted. No subcutaneous emphysema  is seen. IMPRESSION: No radiographic findings of osteomyelitis or subcutaneous emphysema. Electronically Signed   By: Leita Birmingham M.D.   On: 07/14/2024 19:07     Imaging:  Exam Status  Status  Final [99]   PACS Intelerad  Image Link   Show images for DG Foot Complete Right Study Result  Narrative & Impression  EXAM: 3 OR MORE VIEW(S) XRAY OF THE RIGHT FOOT 07/08/2024 06:24:20 PM   COMPARISON: 02/14/2024   CLINICAL HISTORY: Diabetic foot ulcer, evaluate for evidence of underlying osteomyelitis.   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 Diffuse osteopenia. Moderate midfoot osteoarthritis unchanged. No acute fracture. No focal osseous lesion. No joint dislocation.   SOFT TISSUES: Diffuse soft tissue edema throughout the foot with possible subcutaneous gas on the AP and oblique views.   IMPRESSION: 1. Diffuse soft tissue edema throughout the foot with possible subcutaneous gas on the AP and oblique views. This could represent subcutaneous gas from a gas-producing infection or artifact related to large ulceration. Correlation with physical exam findings requested. 2. No radiographic findings to suggest underlying osteomyelitis.   Electronically signed by: Rogelia Myers MD 07/08/2024 06:49 PM EST RP Workstation: [id number]   *Brief pod impression: Agree that there is soft tissue finding consistent with soft tissue defect of ulceration versus possible abscess formation at the distal aspect of the wound - radiolucent striations are not present which is reassuring that subcutaneus gas is not present. Pending MRI for better comparison / clarification of any deeper seated infection or progression of underlying known chronic osteomyelitis.    Assessment  Diagnosis: Improving soft tissue infection of the right foot with resolving erythema and leukocytosis.  Status: Patient demonstrating clinical improvement with decreased local inflammation and no new systemic signs of infection. Advanced imaging and clinical impression is reassuring for no acute deeper seated infection noted. I discussed bone marrow edema signal noted to the cuboid - which clinically  appears well covered without deep defect - that we can CTM closely or potentially if recurrent infection occurs consider bone infection and treat acutely. Patient aware.   Procedure: Bedside debridement performed on 07/09/2024. Full-thickness tissue debridement was carried out, including excision of all nonviable epidermal, dermal, and subcutaneous tissue utilizing sterile #15 blade, tissue nippers/scissors, and curette. Patient tolerated the procedure well without complications or need for cautery.   Plan  - ABX: Appreciate Medicine / ID / Pharmacy assist with antibiotic stewardship and appropriate coverage. Will likely require 10 days PO ABX coverage for outpatient setting SSTI / ascending cellulitis.   - Diet: Per primary. May resume  - Activity: NWB to thee RIGHT lower extremity ; should patient require WB only should be heel WB for transfer. Reinforced importance of strict non-weight-bearing (NWB) restrictions to optimize healing. Discussed patient's current difficulty adhering to NWB due to occupational constraints. Explored options for improved compliance; conversation to continue during follow-up visit.   - Wound Care: Dressing instructions. DAILY to BID dressing changes.  The adjacent wound borders were painted with betadine, the wound base was then dressed with sterile saline moistened gauze to the wound base, followed by dry 4x4 gauze, ABD pad, Kerlix and ACE.  Plan for more advanced wound care set up in outpatient setting.  Education & Return Precautions: Patient counseled extensively on wound care instructions, infection monitoring, and signs warranting immediate return (e.g., increased redness, drainage, odor, or systemic symptoms). Patient verbalized understanding and agreement with the plan.  Dispo: Home with close clinic follow  up - discussed later this week or early next week.    Charna Neeb KANDICE Blush 07/14/2024, 10:14 PM

## 2024-07-14 NOTE — ED Triage Notes (Signed)
 Pt comes with c/o right foot pain and infection. Pt was recently admitted to hospital for diabetic ulcer. Pt states he was dc and sent home with meds. Pt states it is getting worse and very painful. Pt states he has been taking meds like he is suppose to. Pt states redness noted to area.

## 2024-07-14 NOTE — ED Provider Notes (Signed)
 [facility] Provider Note    Event Date/Time   First MD Initiated Contact with Patient 07/14/24 1817     (approximate)   History   Foot Pain   HPI  [name] is a 58 y.o. male with a history of type 2 diabetes, hypertension, chronic wound and depression who presents with increasing right foot pain over the last several days, mainly to the lateral aspect of the foot, associated with increased redness and some swelling.  The patient has a wound there and has been treated for an infection.  He was just hospitalized on IV antibiotics.  He was sent home on antibiotics and has taken them, but states that over the last couple days the symptoms of gotten worse.  He is not having any significant drainage.  He denies any pain going further up the leg.  He has no fever or chills.  I reviewed the past medical records.  The patient was admitted to the hospitalist service earlier this month, discharged 4 days ago, with infected ulcer of his right foot.  He was treated with IV vancomycin  and Zosyn, and discharged on Augmentin  and doxycycline .   Physical Exam   Triage Vital Signs: ED Triage Vitals  Encounter Vitals Group     BP 07/14/24 1807 127/78     Girls Systolic BP Percentile --      Girls Diastolic BP Percentile --      Boys Systolic BP Percentile --      Boys Diastolic BP Percentile --      Pulse Rate 07/14/24 1807 (!) 113     Resp 07/14/24 1807 18     Temp 07/14/24 1807 98.6 F (37 C)     Temp src --      SpO2 07/14/24 1807 98 %     Weight 07/14/24 1808 232 lb 6.4 oz (105.4 kg)     Height 07/14/24 1808 5' 9 (1.753 m)     Head Circumference --      Peak Flow --      Pain Score 07/14/24 1806 9     Pain Loc --      Pain Education --      Exclude from Growth Chart --     Most recent vital signs: Vitals:   07/14/24 1807 07/14/24 1909  BP: 127/78   Pulse: (!) 113   Resp: 18   Temp: 98.6 F (37 C)   SpO2: 98% 100%     General: Alert,  well-appearing no distress.  CV:  Good peripheral perfusion.  Resp:  Normal effort.  Abd:  No distention.  Other:  Approximately 10 cm diameter chronic appearing wound to the plantar aspect of the right foot with granulation tissue.  No purulent drainage.  Surrounding oval centimeter radius of erythema and induration.  No erythema or induration proximally up the leg.   ED Results / Procedures / Treatments   Labs (all labs ordered are listed, but only abnormal results are displayed) Labs Reviewed  LACTIC ACID, PLASMA - Abnormal; Notable for the following components:      Result Value   Lactic Acid, Venous 2.5 (*)    All other components within normal limits  BASIC METABOLIC PANEL WITH GFR - Abnormal; Notable for the following components:   Glucose, Bld 260 (*)    All other components within normal limits  CBC WITH DIFFERENTIAL/PLATELET - Abnormal; Notable for the following components:   Monocytes Absolute 1.1 (*)    Basophils  Absolute 0.2 (*)    Abs Immature Granulocytes 0.10 (*)    All other components within normal limits  SEDIMENTATION RATE - Abnormal; Notable for the following components:   Sed Rate 64 (*)    All other components within normal limits  CULTURE, BLOOD (ROUTINE X 2)  CULTURE, BLOOD (ROUTINE X 2)  LACTIC ACID, PLASMA  PROTIME-INR  APTT     EKG     RADIOLOGY  XR R foot: I independently viewed and interpreted the images; there are no bony abnormalities.  Radiology report indicates no evidence of osteomyelitis.  PROCEDURES:  Critical Care performed: No  Procedures   MEDICATIONS ORDERED IN ED: Medications  vancomycin  (VANCOCIN ) IVPB 1000 mg/200 mL premix (has no administration in time range)  piperacillin-tazobactam (ZOSYN) IVPB 3.375 g (3.375 g Intravenous New Bag/Given 07/14/24 2037)  diphenhydrAMINE  (BENADRYL ) injection 25 mg (has no administration in time range)  oxyCODONE -acetaminophen  (PERCOCET/ROXICET) 5-325 MG per tablet 1 tablet (has no  administration in time range)  acetaminophen  (TYLENOL ) tablet 650 mg (has no administration in time range)  ondansetron  (ZOFRAN ) injection 4 mg (has no administration in time range)  hydrALAZINE (APRESOLINE) injection 5 mg (has no administration in time range)  sodium chloride  0.9 % bolus 1,000 mL (has no administration in time range)  0.9 %  sodium chloride  infusion (has no administration in time range)  oxyCODONE -acetaminophen  (PERCOCET/ROXICET) 5-325 MG per tablet 1 tablet (1 tablet Oral Given 07/14/24 1845)  sodium chloride  0.9 % bolus 1,000 mL (1,000 mLs Intravenous New Bag/Given 07/14/24 2035)     IMPRESSION / MDM / ASSESSMENT AND PLAN / ED COURSE  I reviewed the triage vital signs and the nursing notes.  58 year old male with PMH as noted above presents with worsening pain, redness, and swelling to the right foot wound.  He recently completed a course of IV antibiotics and is on doxycycline  and Augmentin .  Differential diagnosis includes, but is not limited to, wound infection, cellulitis, osteomyelitis.  We will obtain an x-ray, lab workup, and reassess.  Patient's presentation is most consistent with acute presentation with potential threat to life or bodily function.  The patient is on the cardiac monitor to evaluate for evidence of arrhythmia and/or significant heart rate changes.  ----------------------------------------- 8:48 PM on 07/14/2024 -----------------------------------------  X-ray shows no acute findings.  BMP and CBC are unremarkable.  Lactate is slightly elevated.  Given the elevated lactate and tachycardia as well as the worsening symptoms despite being on oral antibiotics, the patient has failed outpatient treatment and will need admission and IV antibiotics for further management.  I consulted Dr. Hilma from the hospitalist service; based on our discussion he agrees to evaluate the patient for admission.   FINAL CLINICAL IMPRESSION(S) / ED DIAGNOSES   Final  diagnoses:  Cellulitis of right foot     Rx / DC Orders   ED Discharge Orders     None        Note:  This document was prepared using Dragon voice recognition software and may include unintentional dictation errors.    Jacolyn Pae, MD 07/14/24 2049

## 2024-07-14 NOTE — ED Notes (Signed)
 Lab called critical lactic of 2.5.  EDP Siadecki notified.

## 2024-07-14 NOTE — Consult Note (Signed)
 Pharmacy Antibiotic Note  [name] is a 58 y.o. male admitted on 07/14/2024 with Right DFI with large ulcer on plantar surface. Was on IV antibiotics on 11/10 admission and discharged on Augmentin  and doxycycline . Patient complains of continued pain on right foot. Pharmacy has been consulted for vancomycin  and Zosyn dosing.  Plan:  Zosyn 3.375g IV q8h (4 hour infusion). Vancomycin  2,500 mg LD scheduled to be given at 2130 on 11/16 Vancomycin  1,000 mg IV Q 12 hrs ordered to start at 1000 on 11/17 . Goal AUC 400-550. Obtain levels at steady state or as clinically indicated.   Expected AUC: 503.4 SCr used: 0.93  Weight used for dosing: IBW Cmin: 13.9, Vd used: 0.5 Will monitor serum creatinine daily while on vanc. Follow renal function and cultures for adjustments      Height: 5' 9 (175.3 cm) Weight: 105.4 kg (232 lb 6.4 oz) IBW/kg (Calculated) : 70.7  Temp (24hrs), Avg:98.4 F (36.9 C), Min:98.1 F (36.7 C), Max:98.6 F (37 C)  Recent Labs  Lab 07/08/24 0941 07/08/24 1702 07/08/24 1930 07/09/24 1325 07/10/24 0423 07/10/24 0805 07/14/24 1832 07/14/24 2021  WBC 8.8 10.7*  --  8.3  --   --  10.2  --   CREATININE  --  0.87  --   --  0.86 0.79 0.93  --   LATICACIDVEN  --  1.9 1.7  --   --   --  2.5* 2.5*    Estimated Creatinine Clearance: 102.3 mL/min (by C-G formula based on SCr of 0.93 mg/dL).    No Known Allergies  Antimicrobials this admission: 11/16 Vancomycin  >>  11/16 Zosyn >>   Microbiology results: 11/16 BCx: pending  Thank you for allowing pharmacy to be a part of this patient's care.  Annabella LOISE Banks, PharmD Clinical Pharmacist 07/14/2024 9:58 PM

## 2024-07-15 ENCOUNTER — Encounter: Payer: Self-pay | Admitting: Internal Medicine

## 2024-07-15 DIAGNOSIS — E11628 Type 2 diabetes mellitus with other skin complications: Secondary | ICD-10-CM

## 2024-07-15 DIAGNOSIS — E1142 Type 2 diabetes mellitus with diabetic polyneuropathy: Secondary | ICD-10-CM

## 2024-07-15 DIAGNOSIS — B9561 Methicillin susceptible Staphylococcus aureus infection as the cause of diseases classified elsewhere: Secondary | ICD-10-CM

## 2024-07-15 DIAGNOSIS — L97519 Non-pressure chronic ulcer of other part of right foot with unspecified severity: Secondary | ICD-10-CM

## 2024-07-15 DIAGNOSIS — Z89411 Acquired absence of right great toe: Secondary | ICD-10-CM

## 2024-07-15 DIAGNOSIS — L089 Local infection of the skin and subcutaneous tissue, unspecified: Secondary | ICD-10-CM | POA: Diagnosis not present

## 2024-07-15 DIAGNOSIS — Z89421 Acquired absence of other right toe(s): Secondary | ICD-10-CM

## 2024-07-15 LAB — BASIC METABOLIC PANEL WITH GFR
Anion gap: 6 (ref 5–15)
BUN: 16 mg/dL (ref 6–20)
CO2: 26 mmol/L (ref 22–32)
Calcium: 8.2 mg/dL — ABNORMAL LOW (ref 8.9–10.3)
Chloride: 106 mmol/L (ref 98–111)
Creatinine, Ser: 0.78 mg/dL (ref 0.61–1.24)
GFR, Estimated: >60 mL/min (ref >60)
Glucose, Bld: 136 mg/dL — ABNORMAL HIGH (ref 70–99)
Potassium: 4.2 mmol/L (ref 3.5–5.1)
Sodium: 138 mmol/L (ref 135–145)

## 2024-07-15 LAB — CBC
HCT: 41.6 % (ref 39.0–52.0)
Hemoglobin: 13.2 g/dL (ref 13.0–17.0)
MCH: 28.6 pg (ref 26.0–34.0)
MCHC: 31.7 g/dL (ref 30.0–36.0)
MCV: 90.2 fL (ref 80.0–100.0)
Platelets: 276 K/uL (ref 150–400)
RBC: 4.61 MIL/uL (ref 4.22–5.81)
RDW: 15.5 % (ref 11.5–15.5)
WBC: 8 K/uL (ref 4.0–10.5)
nRBC: 0 % (ref 0.0–0.2)

## 2024-07-15 LAB — PROTIME-INR
INR: 1 (ref 0.8–1.2)
Prothrombin Time: 14.1 s (ref 11.4–15.2)

## 2024-07-15 LAB — GLUCOSE, CAPILLARY
Glucose-Capillary: 142 mg/dL — ABNORMAL HIGH (ref 70–99)
Glucose-Capillary: 162 mg/dL — ABNORMAL HIGH (ref 70–99)
Glucose-Capillary: 179 mg/dL — ABNORMAL HIGH (ref 70–99)
Glucose-Capillary: 173 mg/dL — ABNORMAL HIGH (ref 70–99)

## 2024-07-15 LAB — APTT: aPTT: 27 s (ref 24–36)

## 2024-07-15 LAB — C-REACTIVE PROTEIN: CRP: 1.4 mg/dL — ABNORMAL HIGH (ref <1.0)

## 2024-07-15 MED ORDER — OXYCODONE HCL 5 MG PO TABS
7.5000 mg | ORAL_TABLET | ORAL | Status: DC | PRN
  Administered 2024-07-15 – 2024-07-17 (×6): 7.5 mg via ORAL
  Filled 2024-07-15 (×6): qty 2

## 2024-07-15 MED ORDER — ALUM & MAG HYDROXIDE-SIMETH 200-200-20 MG/5ML PO SUSP
30.0000 mL | ORAL | Status: DC | PRN
  Administered 2024-07-15: 30 mL via ORAL
  Filled 2024-07-15: qty 30

## 2024-07-15 MED ORDER — METHOCARBAMOL 500 MG PO TABS
500.0000 mg | ORAL_TABLET | Freq: Three times a day (TID) | ORAL | Status: DC
  Administered 2024-07-15 – 2024-07-17 (×7): 500 mg via ORAL
  Filled 2024-07-15 (×7): qty 1

## 2024-07-15 MED ORDER — KETOROLAC TROMETHAMINE 15 MG/ML IJ SOLN
15.0000 mg | Freq: Four times a day (QID) | INTRAMUSCULAR | Status: DC
  Administered 2024-07-15 – 2024-07-17 (×9): 15 mg via INTRAVENOUS
  Filled 2024-07-15 (×9): qty 1

## 2024-07-15 NOTE — Consult Note (Addendum)
 WOC Nurse Consult Note: patient with history of PAD; had recent admission for R plantar foot wound, was debrided inpatient by podiatry  Reason for Consult: R foot wound  Wound type: full thickness R plantar foot r/t neuropathy  Pressure Injury POA: NA  Measurement: see nursing flowsheet; per MD note approximately 10 cm  Wound bed: red moist  Drainage (amount, consistency, odor) no purulent or foul smelling drainage per MD note  Periwound: erythema and induration  Dressing procedure/placement/frequency: Cleanse R plantar foot wound with Vashe do not rinse and allow to air dry. Apply Vashe moistened gauze to wound bed daily, cover with dry gauze and secure with Kerlix roll gauze.  Patient should avoid placing weight on area as much as possible.    POC has been discussed with bedside nurse. Podiatry has been consulted on this wound, any wound care orders placed by podiatry supercede wound care orders placed by [facility] RN.  WOC team will not follow. Re-consult if further needs arise.   Thank you,    Powell Bar MSN, RN-BC, [employer]

## 2024-07-15 NOTE — Consult Note (Signed)
 NAME: [name]  DOB: 1966-03-08  MRN: [id number]  Date/Time: 07/15/2024 7:06 PM  REQUESTING PROVIDER: Dr.Sreenath Subjective:  REASON FOR CONSULT: foot ulcer ?Reviewed all medical records [name] is a 58 y.o. 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 because of pain in the rt foot and redness up the leg Vitals in the ED  07/14/24 18:07  BP 127/78  Temp 98.6 F (37 C)  Pulse Rate 113 !  Resp 18  SpO2 98 %    Latest Reference Range & Units 07/14/24 18:32  WBC 4.0 - 10.5 K/uL 10.2  Hemoglobin 13.0 - 17.0 g/dL 85.3  HCT 60.9 - 47.9 % 44.8  Platelets 150 - 400 K/uL 321  Creatinine 0.61 - 1.24 mg/dL 9.06  Started on vanco/piptazo And MRI of the foot was done on 07/09/2024 and that showed progressive soft tissue ulceration along the inferior medial aspect of the midfoot with surrounding cellulitis.  Interval improvement in previously demonstrated marrow edema and enhancement in the medial forefoot and no evidence of septic arthritis or septic tenosynovitis.  There was a new marrow edema and low-level enhancement within the cuboid.  These findings were nonspecific and could be reactive or secondary to early osteomyelitis.  Patient was seen by urology delete that patient was seen by podiatrist Dr. Tanda and who did not think that  he had any deep infection.  did not order any further debridement.  She asked for a ID consultation.  07/08/2024 in  doctors office he had a wound culture which had Staphylococcus aureus and Providencia rettgeri both susceptible to the Augmentin .  Past  Medical History:  Diagnosis Date   Acute prostatitis without hematuria 05/20/2022   Allergy    Anxiety    Atypical pneumonia 12/18/2022   Complication of anesthesia    pt states at [facility] he stopped breathing due to his sleep apnea-hard to wake up   COVID-19    Depression    Diabetic ulcer of both feet (HCC)    DM (diabetes mellitus), type 2 (HCC)    History of kidney stones    History of methicillin resistant staphylococcus aureus (MRSA) 09/2020   foot   Neuromuscular disorder (HCC)    neuropathy of back and bilateral feet   Obesity    Sleep apnea    does not use cpap    Past Surgical History:  Procedure Laterality Date   AMPUTATION Left    toe amputation   CYSTOSCOPY/URETEROSCOPY/HOLMIUM LASER/STENT PLACEMENT Right 07/29/2022   Procedure: CYSTOSCOPY/URETEROSCOPY/HOLMIUM LASER/STENT PLACEMENT;  Surgeon: Francisca Redell JAYSON, MD;  Location: [facility];  Service: Urology;  Laterality: Right;   EXTRACORPOREAL SHOCK WAVE LITHOTRIPSY Left 02/08/2024   Procedure: LITHOTRIPSY, ESWL;  Surgeon: Twylla Glendia JAYSON, MD;  Location: [facility];  Service: Urology;  Laterality: Left;   EXTRACORPOREAL SHOCK WAVE LITHOTRIPSY Left 02/15/2024   Procedure: LITHOTRIPSY, ESWL;  Surgeon: Penne Knee, MD;  Location: [facility];  Service: Urology;  Laterality: Left;   FOOT SURGERY Right 09/2012   cyst removed   LOWER EXTREMITY ANGIOGRAPHY Right 07/12/2023  Procedure: Lower Extremity Angiography;  Surgeon: Marea Selinda RAMAN, MD;  Location: [facility];  Service: Cardiovascular;  Laterality: Right;   SPINE SURGERY  2004   nerve damage in spine   TOE AMPUTATION Right 06/30/2016   5 toes    Social History   Socioeconomic History   Marital status: Legally Separated    Spouse name: Not on file   Number of children: Not on file   Years of education: Not on file   Highest education level: Bachelor's degree (e.g., BA, AB, BS)  Occupational History   Not on file  Tobacco Use   Smoking status: Never     Passive exposure: Never   Smokeless tobacco: Never  Vaping Use   Vaping status: Never Used  Substance and Sexual Activity   Alcohol use: Never   Drug use: Never   Sexual activity: Not Currently  Other Topics Concern   Not on file  Social History Narrative   Not on file   Social Drivers of Health   Financial Resource Strain: High Risk (06/10/2024)   Overall Financial Resource Strain (CARDIA)    Difficulty of Paying Living Expenses: Hard  Food Insecurity: Food Insecurity Present (07/14/2024)   Hunger Vital Sign    Worried About Running Out of Food in the Last Year: Sometimes true    Ran Out of Food in the Last Year: Sometimes true  Transportation Needs: No Transportation Needs (07/14/2024)   PRAPARE - Administrator, Civil Service (Medical): No    Lack of Transportation (Non-Medical): No  Physical Activity: Sufficiently Active (06/10/2024)   Exercise Vital Sign    Days of Exercise per Week: 5 days    Minutes of Exercise per Session: 60 min  Stress: Stress Concern Present (06/10/2024)   [employer] of Occupational Health - Occupational Stress Questionnaire    Feeling of Stress: Rather much  Social Connections: Socially Isolated (06/10/2024)   Social Connection and Isolation Panel    Frequency of Communication with Friends and Family: Never    Frequency of Social Gatherings with Friends and Family: Never    Attends Religious Services: Never    Database Administrator or Organizations: No    Attends Engineer, Structural: Not on file    Marital Status: Separated  Intimate Partner Violence: Not At Risk (07/14/2024)   Humiliation, Afraid, Rape, and Kick questionnaire    Fear of Current or Ex-Partner: No    Emotionally Abused: No    Physically Abused: No    Sexually Abused: No    Family History  Problem Relation Age of Onset   Heart disease Father    Stroke Father    Heart attack Father 41   Diabetes Father    Heart disease Maternal Grandmother     Stroke Maternal Grandmother    Heart disease Paternal Grandfather    Stroke Paternal Grandfather    No Known Allergies I? Current Facility-Administered Medications  Medication Dose Route Frequency Provider Last Rate Last Admin   0.9 %  sodium chloride  infusion   Intravenous Continuous Niu, Xilin, MD 75 mL/hr at 07/15/24 0355 New Bag at 07/15/24 0355   acetaminophen  (TYLENOL ) tablet 650 mg  650 mg Oral Q6H PRN Niu, Xilin, MD       aspirin  EC tablet 81 mg  81 mg Oral Daily Niu, Xilin, MD   81 mg at 07/15/24 9178   atorvastatin  (LIPITOR) tablet 20 mg  20 mg Oral QHS Niu, Xilin, MD  20 mg at 07/14/24 2209   DULoxetine  (CYMBALTA ) DR capsule 60 mg  60 mg Oral Daily Niu, Xilin, MD   60 mg at 07/15/24 9178   heparin  injection 5,000 Units  5,000 Units Subcutaneous Q8H Niu, Xilin, MD   5,000 Units at 07/15/24 1200   hydrALAZINE (APRESOLINE) injection 5 mg  5 mg Intravenous Q2H PRN Niu, Xilin, MD       insulin  aspart (novoLOG ) injection 0-5 Units  0-5 Units Subcutaneous QHS Niu, Xilin, MD       insulin  aspart (novoLOG ) injection 0-9 Units  0-9 Units Subcutaneous TID WC Niu, Xilin, MD   2 Units at 07/15/24 1746   insulin  glargine-yfgn (SEMGLEE ) injection 17 Units  17 Units Subcutaneous Daily Niu, Xilin, MD   17 Units at 07/15/24 1036   irbesartan  (AVAPRO ) tablet 75 mg  75 mg Oral Daily Niu, Xilin, MD   75 mg at 07/15/24 0830   ketorolac  (TORADOL ) 15 MG/ML injection 15 mg  15 mg Intravenous Q6H Jhonny Sahara B, MD   15 mg at 07/15/24 1745   methocarbamol (ROBAXIN) tablet 500 mg  500 mg Oral TID Sreenath, Sudheer B, MD   500 mg at 07/15/24 1744   morphine  (PF) 2 MG/ML injection 2 mg  2 mg Intravenous Q4H PRN Niu, Xilin, MD   2 mg at 07/14/24 2327   multivitamin with minerals tablet 1 tablet  1 tablet Oral Daily Niu, Xilin, MD   1 tablet at 07/15/24 9178   ondansetron  (ZOFRAN ) injection 4 mg  4 mg Intravenous Q8H PRN Niu, Xilin, MD   4 mg at 07/15/24 1046   oxyCODONE  (Oxy IR/ROXICODONE ) immediate  release tablet 7.5 mg  7.5 mg Oral Q4H PRN Sreenath, Sudheer B, MD   7.5 mg at 07/15/24 1044   piperacillin-tazobactam (ZOSYN) IVPB 3.375 g  3.375 g Intravenous Q8H Niu, Xilin, MD 12.5 mL/hr at 07/15/24 1053 3.375 g at 07/15/24 1053   tamsulosin  (FLOMAX ) capsule 0.4 mg  0.4 mg Oral Daily Niu, Xilin, MD   0.4 mg at 07/15/24 9178   vancomycin  (VANCOCIN ) IVPB 1000 mg/200 mL premix  1,000 mg Intravenous Q12H Niu, Xilin, MD   Stopped at 07/15/24 1838     Abtx:  Anti-infectives (From admission, onward)    Start     Dose/Rate Route Frequency Ordered Stop   07/15/24 1000  vancomycin  (VANCOCIN ) IVPB 1000 mg/200 mL premix        1,000 mg 200 mL/hr over 60 Minutes Intravenous Every 12 hours 07/14/24 2122     07/15/24 0400  piperacillin-tazobactam (ZOSYN) IVPB 3.375 g        3.375 g 12.5 mL/hr over 240 Minutes Intravenous Every 8 hours 07/14/24 2106     07/14/24 2130  vancomycin  (VANCOREADY) IVPB 1500 mg/300 mL       Placed in Followed by Linked Group   1,500 mg 150 mL/hr over 120 Minutes Intravenous Once 07/14/24 2115 07/15/24 0123   07/14/24 2115  vancomycin  (VANCOCIN ) IVPB 1000 mg/200 mL premix       Placed in Followed by Linked Group   1,000 mg 200 mL/hr over 60 Minutes Intravenous  Once 07/14/24 2115 07/14/24 2245   07/14/24 2045  piperacillin-tazobactam (ZOSYN) IVPB 3.375 g        3.375 g 100 mL/hr over 30 Minutes Intravenous  Once 07/14/24 2024 07/14/24 2125   07/14/24 2030  vancomycin  (VANCOCIN ) IVPB 1000 mg/200 mL premix  Status:  Discontinued        1,000 mg 200 mL/hr  over 60 Minutes Intravenous  Once 07/14/24 2024 07/14/24 2115       REVIEW OF SYSTEMS:  Const: negative fever, negative chills, negative weight loss Eyes: negative diplopia or visual changes, negative eye pain ENT: negative coryza, negative sore throat Resp: negative cough, hemoptysis, dyspnea Cards: negative for chest pain, palpitations, lower extremity edema GU: negative for frequency, dysuria and  hematuria GI: Negative for abdominal pain, diarrhea, bleeding, constipation Skin: negative for rash and pruritus Heme: negative for easy bruising and gum/nose bleeding MS: As above  Psychiatric anxiety,   Endocrine: , diabetes Allergy/Immunology- negative for any medication or food allergies ?  Objective:  VITALS:  BP (!) 111/53 (BP Location: Right Arm)   Pulse 67   Temp (!) 97.4 F (36.3 C) (Oral)   Resp 14   Ht 5' 9 (1.753 m)   Wt 105.4 kg   SpO2 100%   BMI 34.32 kg/m   PHYSICAL EXAM:  General: Alert, cooperative, no distress, appears stated age.  Head: Normocephalic, without obvious abnormality, atraumatic. Eyes: Conjunctivae clear, anicteric sclerae. Pupils are equal ENT Nares normal. No drainage or sinus tenderness. Lips, mucosa, and tongue normal. No Thrush Neck: Supple, symmetrical, no adenopathy, thyroid : non tender no carotid bruit and no JVD. Back: No CVA tenderness. Lungs: Clear to auscultation bilaterally. No Wheezing or Rhonchi. No rales. Heart: Regular rate and rhythm, no murmur, rub or gallop. Abdomen: Soft, non-tender,not distended. Bowel sounds normal. No masses Extremities: Right foot TMA  midfoot ulcer is clean it is pretty much the same like I had seen before there is no evidence of any infection or discharge       skin: No rashes or lesions. Or bruising Lymph: Cervical, supraclavicular normal. Neurologic: Grossly non-focal Pertinent Labs Lab Results CBC    Component Value Date/Time   WBC 8.0 07/15/2024 0439   RBC 4.61 07/15/2024 0439   HGB 13.2 07/15/2024 0439   HCT 41.6 07/15/2024 0439   PLT 276 07/15/2024 0439   MCV 90.2 07/15/2024 0439   MCH 28.6 07/15/2024 0439   MCHC 31.7 07/15/2024 0439   RDW 15.5 07/15/2024 0439   LYMPHSABS 1.8 07/14/2024 1832   MONOABS 1.1 (H) 07/14/2024 1832   EOSABS 0.5 07/14/2024 1832   BASOSABS 0.2 (H) 07/14/2024 1832       Latest Ref Rng & Units 07/15/2024    4:39 AM 07/14/2024    6:32 PM 07/10/2024     8:05 AM  CMP  Glucose 70 - 99 mg/dL 863  739  868   BUN 6 - 20 mg/dL 16  19  10    Creatinine 0.61 - 1.24 mg/dL 9.21  9.06  9.20   Sodium 135 - 145 mmol/L 138  137  138   Potassium 3.5 - 5.1 mmol/L 4.2  4.4  4.5   Chloride 98 - 111 mmol/L 106  101  102   CO2 22 - 32 mmol/L 26  25  26    Calcium  8.9 - 10.3 mg/dL 8.2  9.0  8.7       Microbiology: Recent Results (from the past 240 hours)  WOUND CULTURE     Status: None (Preliminary result)   Collection Time: 07/08/24  9:47 AM   Specimen: Wound  Result Value Ref Range Status   MICRO NUMBER: [id number]  Preliminary   SPECIMEN QUALITY: Adequate  Preliminary   SOURCE: LEFT ULCER  Preliminary   STATUS: PRELIMINARY  Preliminary   GRAM STAIN:   Preliminary    No white blood cells seen  No epithelial cells seen No organisms seen  Culture aerobic bacteria pos     Status: Abnormal   Collection Time: 07/08/24  9:47 AM  Result Value Ref Range Status   MICRO NUMBER: [id number]  Final   SPECIMEN QUALITY: Adequate  Final   SOURCE: LEFT ULCER  Final   STATUS: FINAL  Final   GRAM STAIN:   Final    No white blood cells seen No epithelial cells seen No organisms seen   ISOLATE 1: Providencia rettgeri (A)  Final    Comment: Light growth of Providencia rettgeri   ISOLATE 2: Staphylococcus aureus (A)  Final    Comment: Heavy growth of Staphylococcus aureus      Susceptibility   Providencia rettgeri - AEROBIC CULT, GRAM STAIN NEGATIVE 1    AMPICILLIN /SULBACTAM <=2 Sensitive     CEFAZOLIN * >=32 Resistant      * For infections other than uncomplicated UTI caused by E. coli, K. pneumoniae or P. mirabilis: Cefazolin  is resistant if MIC > or = 8 mcg/mL. (Distinguishing susceptible versus intermediate for isolates with MIC < or = 4 mcg/mL requires additional testing.)     CEFTAZIDIME <=0.5 Sensitive     CEFEPIME  <=0.12 Sensitive     CEFTRIAXONE  <=0.25 Sensitive     CIPROFLOXACIN  <=0.06 Sensitive     LEVOFLOXACIN  <=0.12 Sensitive     GENTAMICIN  <=1 Sensitive     MEROPENEM <=0.25 Sensitive     PIP/TAZO <=4 Sensitive     TRIMETH /SULFA  >=320 Resistant    Staphylococcus aureus - AEROBIC CULT, GRAM STAIN POSITIVE 2    VANCOMYCIN  <=0.5 Sensitive     CLINDAMYCIN <=0.25 Sensitive     ERYTHROMYCIN <=0.25 Sensitive     OXACILLIN* 0.5 Sensitive      * For infections other than uncomplicated UTI caused by E. coli, K. pneumoniae or P. mirabilis: Cefazolin  is resistant if MIC > or = 8 mcg/mL. (Distinguishing susceptible versus intermediate for isolates with MIC < or = 4 mcg/mL requires additional testing.) Oxacillin susceptible staphylococci are susceptible to other penicillinase-stable penicillins (e.g., methicillin, nafcillin), beta- lactam/beta-lactamase inhibitor combinations, and cephems with staphylococcal indications, including cefazolin .     CIPROFLOXACIN  <=0.5 Sensitive     LEVOFLOXACIN  0.25 Sensitive     GENTAMICIN <=0.5 Sensitive     TETRACYCLINE <=1 Sensitive     TRIMETH /SULFA  <=10 Sensitive     MOXIFLOXACIN* <=0.25 Sensitive      * For infections other than uncomplicated UTI caused by E. coli, K. pneumoniae or P. mirabilis: Cefazolin  is resistant if MIC > or = 8 mcg/mL. (Distinguishing susceptible versus intermediate for isolates with MIC < or = 4 mcg/mL requires additional testing.) Oxacillin susceptible staphylococci are susceptible to other penicillinase-stable penicillins (e.g., methicillin, nafcillin), beta- lactam/beta-lactamase inhibitor combinations, and cephems with staphylococcal indications, including cefazolin . Legend: S = Susceptible  I = Intermediate R = Resistant  NS = Not susceptible SDD = Susceptible Dose Dependent * = Not Tested  NR = Not Reported **NN = See Therapy Comments   Blood culture (routine x 2)     Status: None   Collection Time: 07/08/24  7:30 PM   Specimen: BLOOD  Result Value Ref Range Status   Specimen Description BLOOD LEFT ANTECUBITAL  Final   Special Requests   Final     BOTTLES DRAWN AEROBIC AND ANAEROBIC Blood Culture adequate volume   Culture   Final    NO GROWTH 5 DAYS Performed at [facility], [address], [place], [place] [address]  Report Status 07/13/2024 FINAL  Final  Culture, blood (routine x 2)     Status: None (Preliminary result)   Collection Time: 07/14/24  6:40 PM   Specimen: BLOOD  Result Value Ref Range Status   Specimen Description BLOOD RIGHT ANTECUBITAL  Final   Special Requests   Final    BOTTLES DRAWN AEROBIC AND ANAEROBIC Blood Culture results may not be optimal due to an inadequate volume of blood received in culture bottles   Culture   Final    NO GROWTH < 12 HOURS Performed at [facility], [address] [place], [place] [address]    Report Status PENDING  Incomplete  Culture, blood (routine x 2)     Status: None (Preliminary result)   Collection Time: 07/14/24  7:54 PM   Specimen: BLOOD  Result Value Ref Range Status   Specimen Description BLOOD BLOOD LEFT ARM  Final   Special Requests   Final    BOTTLES DRAWN AEROBIC AND ANAEROBIC Blood Culture results may not be optimal due to an inadequate volume of blood received in culture bottles   Culture   Final    NO GROWTH < 12 HOURS Performed at [facility], [address], [place], [place] [address]    Report Status PENDING  Incomplete    Lines and Device Date on insertion # of days DC  Central line     Foley     ETT       IMAGING RESULTS: MRI of the foot from 11/10 reviewed No evidence of osteomyelitis I have personally reviewed the films ? 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  This has been present for many years Status post TMA in 2017 MRI no evidence of osteo No fever No leukocytosis The recent culture on 07/08/2024 was provide anterior and Staph aureus which is MSSA Patient is currently on?vancomycin  and pip-tazo.  They both can be stopped The  reason this wound is not healing is not due to need for  antibiotics.  It is more due to  offloading.  Wonder whether he will benefit from hyperbaric oxygen at the wound center May benefit from a skin graft It is okay to keep him on p.o. Augmentin  on discharge for a long time to prevent infection  Diabetes mellitus on insulin   Hypertension on irbesartan  a Hyperlipidemia on atorvastatin   Anxiety/ on duloxetine   This consult involve complex antimicrobial management I have personally spent  60---minutes involved in face-to-face and non-face-to-face activities for this patient on the day of the visit. Professional time spent includes the following activities: Preparing to see the patient (review of tests), Obtaining and/or reviewing separately obtained history (admission/discharge record), Performing a medically appropriate examination and/or evaluation , Ordering medications/tests/procedures, referring and communicating with other health care professionals, Documenting clinical information in the EMR, Independently interpreting results (not separately reported), Communicating results to the patient/, Counseling and educating the patient/f and Care coordination (not separately reported).    ________________________________________________  Note:  This document was prepared using Conservation officer, historic buildings and may include unintentional dictation errors.

## 2024-07-15 NOTE — Plan of Care (Signed)
   Problem: Coping: Goal: Ability to adjust to condition or change in health will improve Outcome: Progressing

## 2024-07-15 NOTE — Progress Notes (Signed)
 PROGRESS NOTE    [name]  FMW:[id number] DOB: 1966-03-14 DOA: 07/14/2024 PCP: Marylynn Verneita CROME, MD    Brief Narrative:   58 y.o. 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   Pt was recently hospitalized from a 11/10 - 11/12 due to infected neuropathic ulcer of right foot with fat layer exposed. MRI showed mild marrow edema likely reactive, but early osteomyelitis cannot be ruled out.  Podiatry was consulted. Bedside debridement was performed by Dr. Tanda 11/11. Pt was treated with was treated with vancomycin  and Zosyn in hospital and discharged on doxycyclines and Augmentin . Pt states that he is taking antibiotics consistently, but his right foot pain, swelling and erythema have been progressively worsening.  No fever or chills.  His right foot pain is intermittent, sharp, 8 out of 10 in severity, nonradiating, not aggravated or alleviated by any known factors.  Patient does not have chest pain, cough, SOB. No nausea, vomiting, diarrhea or abdominal pain. No symptoms of UTI.   Assessment & Plan:   Principal Problem:   Diabetic foot infection (HCC) Active Problems:   Diabetic ulcer of foot associated with diabetes mellitus due to underlying condition, with fat layer exposed (HCC)   Type 2 diabetes mellitus with peripheral neuropathy (HCC)   HTN (hypertension)   Hyperlipidemia associated with type 2 diabetes mellitus (HCC)   Depression with anxiety   Obesity (BMI 30-39.9)   Diabetic foot infection and diabetic ulcer of foot associated with diabetes mellitus due to underlying condition, with fat layer exposed: s/p of debridement in recent admission.  Patient is currently taking doxycycline  and Augmentin , but symptoms have been progressively worsening.  Patient reports worsening right foot pain, erythema  and swelling.  Lactic acid is elevated 2.5, but no fever or leukocytosis, does not meet criteria for sepsis (heart rate of 113, RR 18, temperature 98.6, WBC 10.2). Recent MRI findings on 07/09/2024 cannot completely rule out early stage of osteomyelitis.  Consulted Dr. Tanda of podiatry. Plan: Remain n.p.o. for now.  Continue broad-spectrum IV antibiotic therapy.  Appreciate wound care recommendations.  Continue IV fluids.  Pending formal podiatry evaluation and recommendations.   Type 2 diabetes mellitus with peripheral neuropathy [facility]):  Recent A1c 8.1, poorly controlled.  Patient taking metformin  and Toujeo  insulin  20 units daily Plan: Continue glargine insulin  17 units daily.  SSI.  Avoid mealtime coverage while NPO.  Carb modified diet once advanced.  HTN (hypertension) -Switch Micardis  to irbesartan  Hospital - IV hydralazine as needed   Hyperlipidemia associated with type 2 diabetes mellitus (HCC) -Lipitor   Depression with anxiety -Cymbalta    Obesity (BMI 30-39.9): Patient has Obesity Class 105.4, with body weight  Kg and BMI 34.32  kg/m2.  - Encourage losing weight - Exercise and healthy diet     DVT prophylaxis: SCD Code Status: Full Family Communication: None Disposition Plan: Status is: Inpatient Remains inpatient appropriate because: Suspected surgical site infection   Level of care: Med-Surg  Consultants:  Podiatry  Procedures:  None  Antimicrobials: Vancomycin  Zosyn   Subjective: Seen and examined.  Sitting up on edge of bed.  Reports pain in bilateral feet, worst on right stump.  Objective: Vitals:   07/14/24 2117 07/14/24 2144 07/15/24 0400 07/15/24 0728  BP: (!) 143/70 (!) 91/59 (!) 117/48 (!) 116/53  Pulse: 87 85 73 72  Resp: 17 16  16 16  Temp: 98.1 F (36.7 C) 97.7 F (36.5 C) 97.9 F (36.6 C) (!) 97.5 F (36.4 C)  TempSrc: Oral   Oral  SpO2: 98% 99%  100%  Weight:      Height:        Intake/Output Summary (Last 24 hours) at  07/15/2024 1106 Last data filed at 07/15/2024 0600 Gross per 24 hour  Intake 604.17 ml  Output --  Net 604.17 ml   Filed Weights   07/14/24 1808  Weight: 105.4 kg    Examination:  General exam: Appears calm and comfortable  Respiratory system: Clear to auscultation. Respiratory effort normal. Cardiovascular system: S1-S2, RRR, no murmurs, no pedal edema Gastrointestinal system: Soft, NT/ND, normal bowel sounds Central nervous system: Alert and oriented. No focal neurological deficits. Extremities: Right lower extremity TMA.  Tender to touch.  Decreased range of motion Skin: No rashes, lesions or ulcers Psychiatry: Judgement and insight appear normal. Mood & affect appropriate.     Data Reviewed: I have personally reviewed following labs and imaging studies  CBC: Recent Labs  Lab 07/08/24 1702 07/09/24 1325 07/14/24 1832 07/15/24 0439  WBC 10.7* 8.3 10.2 8.0  NEUTROABS 6.8  --  6.5  --   HGB 13.4 13.2 14.6 13.2  HCT 40.8 41.5 44.8 41.6  MCV 86.6 88.3 88.5 90.2  PLT 252 251 321 276   Basic Metabolic Panel: Recent Labs  Lab 07/08/24 1702 07/10/24 0423 07/10/24 0805 07/14/24 1832 07/15/24 0439  NA 136  --  138 137 138  K 4.0  --  4.5 4.4 4.2  CL 101  --  102 101 106  CO2 24  --  26 25 26   GLUCOSE 233*  --  131* 260* 136*  BUN 14  --  10 19 16   CREATININE 0.87 0.86 0.79 0.93 0.78  CALCIUM  8.3*  --  8.7* 9.0 8.2*   GFR: Estimated Creatinine Clearance: 119 mL/min (by C-G formula based on SCr of 0.78 mg/dL). Liver Function Tests: Recent Labs  Lab 07/08/24 1702  AST 31  ALT 29  ALKPHOS 46  BILITOT 0.7  PROT 7.8  ALBUMIN 3.4*   No results for input(s): LIPASE, AMYLASE in the last 168 hours. No results for input(s): AMMONIA in the last 168 hours. Coagulation Profile: Recent Labs  Lab 07/14/24 2253  INR 1.0   Cardiac Enzymes: No results for input(s): CKTOTAL, CKMB, CKMBINDEX, TROPONINI in the last 168 hours. BNP (last 3 results) No  results for input(s): PROBNP in the last 8760 hours. HbA1C: No results for input(s): HGBA1C in the last 72 hours. CBG: Recent Labs  Lab 07/10/24 0733 07/10/24 1143 07/10/24 1533 07/14/24 2145 07/15/24 0730  GLUCAP 138* 173* 152* 194* 142*   Lipid Profile: No results for input(s): CHOL, HDL, LDLCALC, TRIG, CHOLHDL, LDLDIRECT in the last 72 hours. Thyroid  Function Tests: No results for input(s): TSH, T4TOTAL, FREET4, T3FREE, THYROIDAB in the last 72 hours. Anemia Panel: No results for input(s): VITAMINB12, FOLATE, FERRITIN, TIBC, IRON, RETICCTPCT in the last 72 hours. Sepsis Labs: Recent Labs  Lab 07/08/24 1702 07/08/24 1930 07/14/24 1832 07/14/24 2021  LATICACIDVEN 1.9 1.7 2.5* 2.5*    Recent Results (from the past 240 hours)  WOUND CULTURE     Status: None (Preliminary result)   Collection Time: 07/08/24  9:47 AM   Specimen: Wound  Result Value Ref Range Status   MICRO NUMBER: [id number]  Preliminary   SPECIMEN QUALITY: Adequate  Preliminary   SOURCE: LEFT ULCER  Preliminary  STATUS: PRELIMINARY  Preliminary   GRAM STAIN:   Preliminary    No white blood cells seen No epithelial cells seen No organisms seen  Culture aerobic bacteria pos     Status: Abnormal   Collection Time: 07/08/24  9:47 AM  Result Value Ref Range Status   MICRO NUMBER: [id number]  Final   SPECIMEN QUALITY: Adequate  Final   SOURCE: LEFT ULCER  Final   STATUS: FINAL  Final   GRAM STAIN:   Final    No white blood cells seen No epithelial cells seen No organisms seen   ISOLATE 1: Providencia rettgeri (A)  Final    Comment: Light growth of Providencia rettgeri   ISOLATE 2: Staphylococcus aureus (A)  Final    Comment: Heavy growth of Staphylococcus aureus      Susceptibility   Providencia rettgeri - AEROBIC CULT, GRAM STAIN NEGATIVE 1    AMPICILLIN /SULBACTAM <=2 Sensitive     CEFAZOLIN * >=32 Resistant      * For infections other than uncomplicated UTI caused  by E. coli, K. pneumoniae or P. mirabilis: Cefazolin  is resistant if MIC > or = 8 mcg/mL. (Distinguishing susceptible versus intermediate for isolates with MIC < or = 4 mcg/mL requires additional testing.)     CEFTAZIDIME <=0.5 Sensitive     CEFEPIME  <=0.12 Sensitive     CEFTRIAXONE  <=0.25 Sensitive     CIPROFLOXACIN  <=0.06 Sensitive     LEVOFLOXACIN  <=0.12 Sensitive     GENTAMICIN <=1 Sensitive     MEROPENEM <=0.25 Sensitive     PIP/TAZO <=4 Sensitive     TRIMETH /SULFA  >=320 Resistant    Staphylococcus aureus - AEROBIC CULT, GRAM STAIN POSITIVE 2    VANCOMYCIN  <=0.5 Sensitive     CLINDAMYCIN <=0.25 Sensitive     ERYTHROMYCIN <=0.25 Sensitive     OXACILLIN* 0.5 Sensitive      * For infections other than uncomplicated UTI caused by E. coli, K. pneumoniae or P. mirabilis: Cefazolin  is resistant if MIC > or = 8 mcg/mL. (Distinguishing susceptible versus intermediate for isolates with MIC < or = 4 mcg/mL requires additional testing.) Oxacillin susceptible staphylococci are susceptible to other penicillinase-stable penicillins (e.g., methicillin, nafcillin), beta- lactam/beta-lactamase inhibitor combinations, and cephems with staphylococcal indications, including cefazolin .     CIPROFLOXACIN  <=0.5 Sensitive     LEVOFLOXACIN  0.25 Sensitive     GENTAMICIN <=0.5 Sensitive     TETRACYCLINE <=1 Sensitive     TRIMETH /SULFA  <=10 Sensitive     MOXIFLOXACIN* <=0.25 Sensitive      * For infections other than uncomplicated UTI caused by E. coli, K. pneumoniae or P. mirabilis: Cefazolin  is resistant if MIC > or = 8 mcg/mL. (Distinguishing susceptible versus intermediate for isolates with MIC < or = 4 mcg/mL requires additional testing.) Oxacillin susceptible staphylococci are susceptible to other penicillinase-stable penicillins (e.g., methicillin, nafcillin), beta- lactam/beta-lactamase inhibitor combinations, and cephems with staphylococcal indications,  including cefazolin . Legend: S = Susceptible  I = Intermediate R = Resistant  NS = Not susceptible SDD = Susceptible Dose Dependent * = Not Tested  NR = Not Reported **NN = See Therapy Comments   Blood culture (routine x 2)     Status: None   Collection Time: 07/08/24  7:30 PM   Specimen: BLOOD  Result Value Ref Range Status   Specimen Description BLOOD LEFT ANTECUBITAL  Final   Special Requests   Final    BOTTLES DRAWN AEROBIC AND ANAEROBIC Blood Culture adequate volume   Culture   Final  NO GROWTH 5 DAYS Performed at [facility], [address] [place], [place] [address]    Report Status 07/13/2024 FINAL  Final  Culture, blood (routine x 2)     Status: None (Preliminary result)   Collection Time: 07/14/24  6:40 PM   Specimen: BLOOD  Result Value Ref Range Status   Specimen Description BLOOD RIGHT ANTECUBITAL  Final   Special Requests   Final    BOTTLES DRAWN AEROBIC AND ANAEROBIC Blood Culture results may not be optimal due to an inadequate volume of blood received in culture bottles   Culture   Final    NO GROWTH < 12 HOURS Performed at [facility], [address] [place], [place] [address]    Report Status PENDING  Incomplete  Culture, blood (routine x 2)     Status: None (Preliminary result)   Collection Time: 07/14/24  7:54 PM   Specimen: BLOOD  Result Value Ref Range Status   Specimen Description BLOOD BLOOD LEFT ARM  Final   Special Requests   Final    BOTTLES DRAWN AEROBIC AND ANAEROBIC Blood Culture results may not be optimal due to an inadequate volume of blood received in culture bottles   Culture   Final    NO GROWTH < 12 HOURS Performed at [facility], [address] [place], [place] [address]    Report Status PENDING  Incomplete         Radiology Studies: DG Foot Complete Right Result Date: 07/14/2024 CLINICAL DATA:  Cellulitis.  Right foot pain and infection. EXAM: RIGHT FOOT COMPLETE - 3+ VIEW COMPARISON:   07/08/2024. 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 No acute fracture or dislocation is seen. Degenerative changes are noted in the midfoot and hindfoot. There is moderate calcaneal spurring. No periosteal elevation or bony erosion is seen. Diffuse soft tissue swelling is noted. No subcutaneous emphysema is seen. IMPRESSION: No radiographic findings of osteomyelitis or subcutaneous emphysema. Electronically Signed   By: Leita Birmingham M.D.   On: 07/14/2024 19:07        Scheduled Meds:  aspirin  EC  81 mg Oral Daily   atorvastatin   20 mg Oral QHS   DULoxetine   60 mg Oral Daily   heparin   5,000 Units Subcutaneous Q8H   insulin  aspart  0-5 Units Subcutaneous QHS   insulin  aspart  0-9 Units Subcutaneous TID WC   insulin  glargine-yfgn  17 Units Subcutaneous Daily   irbesartan   75 mg Oral Daily   ketorolac   15 mg Intravenous Q6H   methocarbamol  500 mg Oral TID   multivitamin with minerals  1 tablet Oral Daily   tamsulosin   0.4 mg Oral Daily   Continuous Infusions:  sodium chloride  75 mL/hr at 07/15/24 0355   piperacillin-tazobactam (ZOSYN)  IV 3.375 g (07/15/24 1053)   vancomycin  1,000 mg (07/15/24 9161)     LOS: 1 day    Calvin KATHEE Robson, MD [facility]   If 7PM-7AM, please contact night-coverage  07/15/2024, 11:06 AM

## 2024-07-16 DIAGNOSIS — Z794 Long term (current) use of insulin: Secondary | ICD-10-CM

## 2024-07-16 DIAGNOSIS — E11621 Type 2 diabetes mellitus with foot ulcer: Secondary | ICD-10-CM

## 2024-07-16 DIAGNOSIS — E1140 Type 2 diabetes mellitus with diabetic neuropathy, unspecified: Secondary | ICD-10-CM | POA: Diagnosis not present

## 2024-07-16 DIAGNOSIS — E11628 Type 2 diabetes mellitus with other skin complications: Secondary | ICD-10-CM | POA: Diagnosis not present

## 2024-07-16 DIAGNOSIS — Z89431 Acquired absence of right foot: Secondary | ICD-10-CM

## 2024-07-16 DIAGNOSIS — L97512 Non-pressure chronic ulcer of other part of right foot with fat layer exposed: Secondary | ICD-10-CM | POA: Diagnosis not present

## 2024-07-16 DIAGNOSIS — L089 Local infection of the skin and subcutaneous tissue, unspecified: Secondary | ICD-10-CM | POA: Diagnosis not present

## 2024-07-16 LAB — GLUCOSE, CAPILLARY
Glucose-Capillary: 120 mg/dL — ABNORMAL HIGH (ref 70–99)
Glucose-Capillary: 189 mg/dL — ABNORMAL HIGH (ref 70–99)
Glucose-Capillary: 158 mg/dL — ABNORMAL HIGH (ref 70–99)
Glucose-Capillary: 126 mg/dL — ABNORMAL HIGH (ref 70–99)

## 2024-07-16 LAB — MISC LABCORP TEST (SEND OUT): Labcorp test code: 83935

## 2024-07-16 MED ORDER — SODIUM CHLORIDE 0.9 % IV SOLN
1.5000 g | Freq: Four times a day (QID) | INTRAVENOUS | Status: DC
  Administered 2024-07-16 – 2024-07-17 (×4): 1.5 g via INTRAVENOUS
  Filled 2024-07-16 (×5): qty 4

## 2024-07-16 MED ORDER — ENOXAPARIN SODIUM 60 MG/0.6ML IJ SOSY
50.0000 mg | PREFILLED_SYRINGE | INTRAMUSCULAR | Status: DC
  Administered 2024-07-16: 50 mg via SUBCUTANEOUS
  Filled 2024-07-16: qty 0.6

## 2024-07-16 NOTE — Progress Notes (Signed)
 Date of Admission:  07/14/2024   T         ID: [name] is a 58 y.o. male  Principal Problem:   Diabetic foot infection (HCC) Active Problems:   Hyperlipidemia associated with type 2 diabetes mellitus (HCC)   Diabetic ulcer of foot associated with diabetes mellitus due to underlying condition, with fat layer exposed (HCC)   HTN (hypertension)   Type 2 diabetes mellitus with peripheral neuropathy (HCC)   Obesity (BMI 30-39.9)   Depression with anxiety    Subjective: Pt is doing okay Has some pain rt foot  Medications:   aspirin  EC  81 mg Oral Daily   atorvastatin   20 mg Oral QHS   DULoxetine   60 mg Oral Daily   enoxaparin  (LOVENOX ) injection  50 mg Subcutaneous Q24H   insulin  aspart  0-5 Units Subcutaneous QHS   insulin  aspart  0-9 Units Subcutaneous TID WC   insulin  glargine-yfgn  17 Units Subcutaneous Daily   irbesartan   75 mg Oral Daily   ketorolac   15 mg Intravenous Q6H   methocarbamol  500 mg Oral TID   multivitamin with minerals  1 tablet Oral Daily   tamsulosin   0.4 mg Oral Daily    Objective: Vital signs in last 24 hours: Patient Vitals for the past 24 hrs:  BP Temp Pulse Resp SpO2  07/16/24 2009 (!) 110/56 97.6 F (36.4 C) 78 20 98 %  07/16/24 1419 125/62 98 F (36.7 C) 96 18 100 %  07/16/24 0733 (!) 125/58 97.8 F (36.6 C) 75 19 100 %  07/16/24 0309 116/71 (!) 97.4 F (36.3 C) 74 17 100 %       PHYSICAL EXAM:  General: Alert, cooperative, no distress, appears stated age.  Lungs: Clear to auscultation bilaterally. No Wheezing or Rhonchi. No rales. Heart: Regular rate and rhythm, no murmur, rub or gallop. Abdomen: Soft, non-tender,not distended. Bowel sounds normal. No masses Extremities: rt foot     Skin: No rashes or lesions. Or bruising Lymph: Cervical, supraclavicular normal. Neurologic: Grossly non-focal  Lab Results    Latest Ref Rng & Units 07/15/2024    4:39 AM 07/14/2024    6:32 PM 07/09/2024    1:25 PM  CBC  WBC 4.0 -  10.5 K/uL 8.0  10.2  8.3   Hemoglobin 13.0 - 17.0 g/dL 86.7  85.3  86.7   Hematocrit 39.0 - 52.0 % 41.6  44.8  41.5   Platelets 150 - 400 K/uL 276  321  251        Latest Ref Rng & Units 07/15/2024    4:39 AM 07/14/2024    6:32 PM 07/10/2024    8:05 AM  CMP  Glucose 70 - 99 mg/dL 863  739  868   BUN 6 - 20 mg/dL 16  19  10    Creatinine 0.61 - 1.24 mg/dL 9.21  9.06  9.20   Sodium 135 - 145 mmol/L 138  137  138   Potassium 3.5 - 5.1 mmol/L 4.2  4.4  4.5   Chloride 98 - 111 mmol/L 106  101  102   CO2 22 - 32 mmol/L 26  25  26    Calcium  8.9 - 10.3 mg/dL 8.2  9.0  8.7       Microbiology: 07/14/24 BC -NG Studies/Results:  MRI of the right foot from 07/08/2024 reviewed Personally No evidence of osteomyelitis  Assessment/Plan: Diabetes mellitus with neuropathy with chronic pressure ulcer on the right foot which has  had TMA in 2017. Chronic wound which has been managed topically and with oral antibiotics intermittently MRI no evidence of osteomyelitis Patient does not have any fever or leukocytosis The recent culture from 07/08/2024 showed [place] she and Staph aureus which is methicillin sensitive staph Patient was on vancomycin  and pip-tazo which were discontinued and he was changed to Unasyn .  So that on discharge he could go on p.o. Augmentin .  The reason that this wound is not healing is is not actually because of infection but it was more of not offloading the foot. Other other modalities or interventions that can help with wound healing would be hyperbaric oxygen at the wound center and also a skin graft Patient works at the [employer] and has been unable to take off work because of financial stressors  Diabetes mellitus on insulin   Hypertension on irbesartan   Hyperlipidemia on atorvastatin   Anxiety on duloxetine   Discussed the management of the patient, podiatrist on the hospitalist. He will follow-up with podiatrist.  On discharge he can go on p.o. Augmentin  for 2  weeks.   ID will sign off Call if needed.

## 2024-07-16 NOTE — Progress Notes (Signed)
 PROGRESS NOTE    [name]  FMW:[id number] DOB: [birth year] DOA: 07/14/2024 PCP: Marylynn Verneita CROME, MD    Brief Narrative:   58 y.o. 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   Pt was recently hospitalized from a 11/10 - 11/12 due to infected neuropathic ulcer of right foot with fat layer exposed. MRI showed mild marrow edema likely reactive, but early osteomyelitis cannot be ruled out.  Podiatry was consulted. Bedside debridement was performed by Dr. Tanda 11/11. Pt was treated with was treated with vancomycin  and Zosyn in hospital and discharged on doxycyclines and Augmentin . Pt states that he is taking antibiotics consistently, but his right foot pain, swelling and erythema have been progressively worsening.  No fever or chills.  His right foot pain is intermittent, sharp, 8 out of 10 in severity, nonradiating, not aggravated or alleviated by any known factors.  Patient does not have chest pain, cough, SOB. No nausea, vomiting, diarrhea or abdominal pain. No symptoms of UTI.   Assessment & Plan:   Principal Problem:   Diabetic foot infection (HCC) Active Problems:   Diabetic ulcer of foot associated with diabetes mellitus due to underlying condition, with fat layer exposed (HCC)   Type 2 diabetes mellitus with peripheral neuropathy (HCC)   HTN (hypertension)   Hyperlipidemia associated with type 2 diabetes mellitus (HCC)   Depression with anxiety   Obesity (BMI 30-39.9)   Diabetic foot infection and diabetic ulcer of foot associated with diabetes mellitus due to underlying condition, with fat layer exposed: s/p of debridement in recent admission.  Patient is currently taking doxycycline  and Augmentin , but symptoms have been progressively worsening.  Patient reports worsening right foot pain, erythema  and swelling.  Lactic acid is elevated 2.5, but no fever or leukocytosis, does not meet criteria for sepsis (heart rate of 113, RR 18, temperature 98.6, WBC 10.2). Recent MRI findings on 07/09/2024 cannot completely rule out early stage of osteomyelitis.  Consulted Dr. Tanda of podiatry. Plan: No plans for surgical intervention.  Case discussed with podiatry.  Request ID input regarding the need for biopsy.  ID consulted and formal recommendations are pending at this time.  For now continue broad-spectrum IV antibiotic therapy.  Appreciate wound care recommendations.  As needed pain control.  Podiatry and ID to follow-up.   Type 2 diabetes mellitus with peripheral neuropathy [facility]):  Recent A1c 8.1, poorly controlled.  Patient taking metformin  and Toujeo  insulin  20 units daily Plan: Continue glargine insulin  17 units daily.   SSI.   Carb modified diet Glycemic control adequate at this time  HTN (hypertension) Auto substitute Micardis  to irbesartan  IV hydralazine as needed   Hyperlipidemia associated with type 2 diabetes mellitus (HCC) Lipitor   Depression with anxiety Cymbalta    Obesity (BMI 30-39.9):  Complicating factor in overall care and prognosis     DVT prophylaxis: SQ Lovenox  Code Status: Full Family Communication: None Disposition Plan: Status is: Inpatient Remains inpatient appropriate because: Suspected wound infection   Level of care: Med-Surg  Consultants:  Podiatry  Procedures:  None  Antimicrobials: Vancomycin  Zosyn   Subjective: Seen and examined.  Lying in bed.  Appears much more comfortable today.  Continues to complain of pain but improved from prior.  Objective: Vitals:   07/15/24 1711 07/15/24 1950 07/16/24 0309 07/16/24 0733  BP: (!) 111/53 105/64 116/71 ROLLEN)  125/58  Pulse: 67 82 74 75  Resp: 14 16 17 19   Temp: (!) 97.4 F (36.3 C) (!) 97.3 F (36.3 C) (!) 97.4 F (36.3 C) 97.8 F (36.6 C)  TempSrc: Oral     SpO2: 100% 98% 100% 100%   Weight:      Height:        Intake/Output Summary (Last 24 hours) at 07/16/2024 1113 Last data filed at 07/16/2024 1059 Gross per 24 hour  Intake 1802.6 ml  Output --  Net 1802.6 ml   Filed Weights   07/14/24 1808  Weight: 105.4 kg    Examination:  General exam: No acute distress Respiratory system: Lung clear.  Normal work breathing.  Room air Cardiovascular system: S1-S2, RRR, no murmurs, no pedal edema Gastrointestinal system: Soft, NT/ND, normal bowel sounds Central nervous system: Alert and oriented. No focal neurological deficits. Extremities: Right lower extremity TMA.  Tender to touch.  Decreased range of motion Skin: No rashes, lesions or ulcers Psychiatry: Judgement and insight appear normal. Mood & affect appropriate.     Data Reviewed: I have personally reviewed following labs and imaging studies  CBC: Recent Labs  Lab 07/09/24 1325 07/14/24 1832 07/15/24 0439  WBC 8.3 10.2 8.0  NEUTROABS  --  6.5  --   HGB 13.2 14.6 13.2  HCT 41.5 44.8 41.6  MCV 88.3 88.5 90.2  PLT 251 321 276   Basic Metabolic Panel: Recent Labs  Lab 07/10/24 0423 07/10/24 0805 07/14/24 1832 07/15/24 0439  NA  --  138 137 138  K  --  4.5 4.4 4.2  CL  --  102 101 106  CO2  --  26 25 26   GLUCOSE  --  131* 260* 136*  BUN  --  10 19 16   CREATININE 0.86 0.79 0.93 0.78  CALCIUM   --  8.7* 9.0 8.2*   GFR: Estimated Creatinine Clearance: 119 mL/min (by C-G formula based on SCr of 0.78 mg/dL). Liver Function Tests: No results for input(s): AST, ALT, ALKPHOS, BILITOT, PROT, ALBUMIN in the last 168 hours.  No results for input(s): LIPASE, AMYLASE in the last 168 hours. No results for input(s): AMMONIA in the last 168 hours. Coagulation Profile: Recent Labs  Lab 07/14/24 2253  INR 1.0   Cardiac Enzymes: No results for input(s): CKTOTAL, CKMB, CKMBINDEX, TROPONINI in the last 168 hours. BNP (last 3 results) No results for input(s): PROBNP in the  last 8760 hours. HbA1C: No results for input(s): HGBA1C in the last 72 hours. CBG: Recent Labs  Lab 07/15/24 0730 07/15/24 1148 07/15/24 1709 07/15/24 2100 07/16/24 0730  GLUCAP 142* 162* 179* 173* 120*   Lipid Profile: No results for input(s): CHOL, HDL, LDLCALC, TRIG, CHOLHDL, LDLDIRECT in the last 72 hours. Thyroid  Function Tests: No results for input(s): TSH, T4TOTAL, FREET4, T3FREE, THYROIDAB in the last 72 hours. Anemia Panel: No results for input(s): VITAMINB12, FOLATE, FERRITIN, TIBC, IRON, RETICCTPCT in the last 72 hours. Sepsis Labs: Recent Labs  Lab 07/14/24 1832 07/14/24 2021  LATICACIDVEN 2.5* 2.5*    Recent Results (from the past 240 hours)  WOUND CULTURE     Status: None (Preliminary result)   Collection Time: 07/08/24  9:47 AM   Specimen: Wound  Result Value Ref Range Status   MICRO NUMBER: [id number]  Preliminary   SPECIMEN QUALITY: Adequate  Preliminary   SOURCE: LEFT ULCER  Preliminary   STATUS: PRELIMINARY  Preliminary   GRAM STAIN:   Preliminary    No white blood cells  seen No epithelial cells seen No organisms seen  Culture aerobic bacteria pos     Status: Abnormal   Collection Time: 07/08/24  9:47 AM  Result Value Ref Range Status   MICRO NUMBER: [id number]  Final   SPECIMEN QUALITY: Adequate  Final   SOURCE: LEFT ULCER  Final   STATUS: FINAL  Final   GRAM STAIN:   Final    No white blood cells seen No epithelial cells seen No organisms seen   ISOLATE 1: Providencia rettgeri (A)  Final    Comment: Light growth of Providencia rettgeri   ISOLATE 2: Staphylococcus aureus (A)  Final    Comment: Heavy growth of Staphylococcus aureus      Susceptibility   Providencia rettgeri - AEROBIC CULT, GRAM STAIN NEGATIVE 1    AMPICILLIN /SULBACTAM <=2 Sensitive     CEFAZOLIN * >=32 Resistant      * For infections other than uncomplicated UTI caused by E. coli, K. pneumoniae or P. mirabilis: Cefazolin  is resistant if MIC >  or = 8 mcg/mL. (Distinguishing susceptible versus intermediate for isolates with MIC < or = 4 mcg/mL requires additional testing.)     CEFTAZIDIME <=0.5 Sensitive     CEFEPIME  <=0.12 Sensitive     CEFTRIAXONE  <=0.25 Sensitive     CIPROFLOXACIN  <=0.06 Sensitive     LEVOFLOXACIN  <=0.12 Sensitive     GENTAMICIN <=1 Sensitive     MEROPENEM <=0.25 Sensitive     PIP/TAZO <=4 Sensitive     TRIMETH /SULFA  >=320 Resistant    Staphylococcus aureus - AEROBIC CULT, GRAM STAIN POSITIVE 2    VANCOMYCIN  <=0.5 Sensitive     CLINDAMYCIN <=0.25 Sensitive     ERYTHROMYCIN <=0.25 Sensitive     OXACILLIN* 0.5 Sensitive      * For infections other than uncomplicated UTI caused by E. coli, K. pneumoniae or P. mirabilis: Cefazolin  is resistant if MIC > or = 8 mcg/mL. (Distinguishing susceptible versus intermediate for isolates with MIC < or = 4 mcg/mL requires additional testing.) Oxacillin susceptible staphylococci are susceptible to other penicillinase-stable penicillins (e.g., methicillin, nafcillin), beta- lactam/beta-lactamase inhibitor combinations, and cephems with staphylococcal indications, including cefazolin .     CIPROFLOXACIN  <=0.5 Sensitive     LEVOFLOXACIN  0.25 Sensitive     GENTAMICIN <=0.5 Sensitive     TETRACYCLINE <=1 Sensitive     TRIMETH /SULFA  <=10 Sensitive     MOXIFLOXACIN* <=0.25 Sensitive      * For infections other than uncomplicated UTI caused by E. coli, K. pneumoniae or P. mirabilis: Cefazolin  is resistant if MIC > or = 8 mcg/mL. (Distinguishing susceptible versus intermediate for isolates with MIC < or = 4 mcg/mL requires additional testing.) Oxacillin susceptible staphylococci are susceptible to other penicillinase-stable penicillins (e.g., methicillin, nafcillin), beta- lactam/beta-lactamase inhibitor combinations, and cephems with staphylococcal indications, including cefazolin . Legend: S = Susceptible  I = Intermediate R = Resistant  NS = Not  susceptible SDD = Susceptible Dose Dependent * = Not Tested  NR = Not Reported **NN = See Therapy Comments   Blood culture (routine x 2)     Status: None   Collection Time: 07/08/24  7:30 PM   Specimen: BLOOD  Result Value Ref Range Status   Specimen Description BLOOD LEFT ANTECUBITAL  Final   Special Requests   Final    BOTTLES DRAWN AEROBIC AND ANAEROBIC Blood Culture adequate volume   Culture   Final    NO GROWTH 5 DAYS Performed at [facility], [address], [place], [place] [address]  Report Status 07/13/2024 FINAL  Final  Culture, blood (routine x 2)     Status: None (Preliminary result)   Collection Time: 07/14/24  6:40 PM   Specimen: BLOOD  Result Value Ref Range Status   Specimen Description BLOOD RIGHT ANTECUBITAL  Final   Special Requests   Final    BOTTLES DRAWN AEROBIC AND ANAEROBIC Blood Culture results may not be optimal due to an inadequate volume of blood received in culture bottles   Culture   Final    NO GROWTH 2 DAYS Performed at [facility], [address], [place], [place] [address]    Report Status PENDING  Incomplete  Culture, blood (routine x 2)     Status: None (Preliminary result)   Collection Time: 07/14/24  7:54 PM   Specimen: BLOOD  Result Value Ref Range Status   Specimen Description BLOOD BLOOD LEFT ARM  Final   Special Requests   Final    BOTTLES DRAWN AEROBIC AND ANAEROBIC Blood Culture results may not be optimal due to an inadequate volume of blood received in culture bottles   Culture   Final    NO GROWTH 2 DAYS Performed at [facility], [address] [place], [place] [address]    Report Status PENDING  Incomplete         Radiology Studies: DG Foot Complete Right Result Date: 07/14/2024 CLINICAL DATA:  Cellulitis.  Right foot pain and infection. EXAM: RIGHT FOOT COMPLETE - 3+ VIEW COMPARISON:  07/08/2024. 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 No acute  fracture or dislocation is seen. Degenerative changes are noted in the midfoot and hindfoot. There is moderate calcaneal spurring. No periosteal elevation or bony erosion is seen. Diffuse soft tissue swelling is noted. No subcutaneous emphysema is seen. IMPRESSION: No radiographic findings of osteomyelitis or subcutaneous emphysema. Electronically Signed   By: Leita Birmingham M.D.   On: 07/14/2024 19:07        Scheduled Meds:  aspirin  EC  81 mg Oral Daily   atorvastatin   20 mg Oral QHS   DULoxetine   60 mg Oral Daily   heparin   5,000 Units Subcutaneous Q8H   insulin  aspart  0-5 Units Subcutaneous QHS   insulin  aspart  0-9 Units Subcutaneous TID WC   insulin  glargine-yfgn  17 Units Subcutaneous Daily   irbesartan   75 mg Oral Daily   ketorolac   15 mg Intravenous Q6H   methocarbamol  500 mg Oral TID   multivitamin with minerals  1 tablet Oral Daily   tamsulosin   0.4 mg Oral Daily   Continuous Infusions:  sodium chloride  Stopped (07/15/24 1457)   piperacillin-tazobactam (ZOSYN)  IV Stopped (07/16/24 0002)   vancomycin  1,000 mg (07/16/24 1050)     LOS: 2 days    Calvin KATHEE Robson, MD [facility]   If 7PM-7AM, please contact night-coverage  07/16/2024, 11:13 AM

## 2024-07-16 NOTE — Progress Notes (Signed)
 I have reviewed and concur with this student's documentation and MAR administration.  Charmaine LITTIE Reiter, RN 07/16/2024 1:23 PM

## 2024-07-16 NOTE — TOC CM/SW Note (Signed)
 Transition of Care [facility]) - Inpatient Brief Assessment   Patient Details  Name: [name] MRN: [id number] Date of Birth: 1966/05/14  Transition of Care [facility]) CM/SW Contact:    Daved JONETTA Hamilton, RN Phone Number: 07/16/2024, 8:52 AM   Clinical Narrative:   Transition of Care [facility]) Screening Note   Patient Details  Name: [name] Date of Birth: [birth year]   Transition of Care [facility]) CM/SW Contact:    Daved JONETTA Hamilton, RN Phone Number: 07/16/2024, 8:52 AM    Transition of Care Department [facility]) has reviewed patient and no TOC needs have been identified at this time. If new patient transition needs arise, please place a TOC consult.    Transition of Care Asessment: Insurance and Status: Insurance coverage has been reviewed Patient has primary care physician: Yes   Prior level of function:: Independent Prior/Current Home Services: No current home services Social Drivers of Health Review: SDOH reviewed no interventions necessary Readmission risk has been reviewed: Yes Transition of care needs: no transition of care needs at this time

## 2024-07-16 NOTE — Plan of Care (Signed)
   Problem: Education: Goal: Ability to describe self-care measures that may prevent or decrease complications (Diabetes Survival Skills Education) will improve Outcome: Progressing Goal: Individualized Educational Video(s) Outcome: Progressing   Problem: Coping: Goal: Ability to adjust to condition or change in health will improve Outcome: Progressing

## 2024-07-17 ENCOUNTER — Inpatient Hospital Stay

## 2024-07-17 DIAGNOSIS — L089 Local infection of the skin and subcutaneous tissue, unspecified: Secondary | ICD-10-CM | POA: Diagnosis not present

## 2024-07-17 DIAGNOSIS — E11628 Type 2 diabetes mellitus with other skin complications: Secondary | ICD-10-CM | POA: Diagnosis not present

## 2024-07-17 LAB — GLUCOSE, CAPILLARY
Glucose-Capillary: 154 mg/dL — ABNORMAL HIGH (ref 70–99)
Glucose-Capillary: 170 mg/dL — ABNORMAL HIGH (ref 70–99)
Glucose-Capillary: 141 mg/dL — ABNORMAL HIGH (ref 70–99)

## 2024-07-17 LAB — CBC WITH DIFFERENTIAL/PLATELET
Abs Immature Granulocytes: 0.05 K/uL (ref 0.00–0.07)
Basophils Absolute: 0.1 K/uL (ref 0.0–0.1)
Basophils Relative: 2 %
Eosinophils Absolute: 0.5 K/uL (ref 0.0–0.5)
Eosinophils Relative: 9 %
HCT: 40.1 % (ref 39.0–52.0)
Hemoglobin: 13 g/dL (ref 13.0–17.0)
Immature Granulocytes: 1 %
Lymphocytes Relative: 36 %
Lymphs Abs: 2.2 K/uL (ref 0.7–4.0)
MCH: 28.7 pg (ref 26.0–34.0)
MCHC: 32.4 g/dL (ref 30.0–36.0)
MCV: 88.5 fL (ref 80.0–100.0)
Monocytes Absolute: 0.9 K/uL (ref 0.1–1.0)
Monocytes Relative: 15 %
Neutro Abs: 2.4 K/uL (ref 1.7–7.7)
Neutrophils Relative %: 37 %
Platelets: 253 K/uL (ref 150–400)
RBC: 4.53 MIL/uL (ref 4.22–5.81)
RDW: 15.5 % (ref 11.5–15.5)
WBC: 6.2 K/uL (ref 4.0–10.5)
nRBC: 0 % (ref 0.0–0.2)

## 2024-07-17 LAB — BASIC METABOLIC PANEL WITH GFR
Anion gap: 6 (ref 5–15)
BUN: 11 mg/dL (ref 6–20)
CO2: 28 mmol/L (ref 22–32)
Calcium: 8.2 mg/dL — ABNORMAL LOW (ref 8.9–10.3)
Chloride: 104 mmol/L (ref 98–111)
Creatinine, Ser: 0.86 mg/dL (ref 0.61–1.24)
GFR, Estimated: >60 mL/min (ref >60)
Glucose, Bld: 117 mg/dL — ABNORMAL HIGH (ref 70–99)
Potassium: 4.2 mmol/L (ref 3.5–5.1)
Sodium: 138 mmol/L (ref 135–145)

## 2024-07-17 NOTE — Plan of Care (Signed)
  Problem: Education: Goal: Ability to describe self-care measures that may prevent or decrease complications (Diabetes Survival Skills Education) will improve Outcome: Adequate for Discharge Goal: Individualized Educational Video(s) Outcome: Adequate for Discharge   Problem: Coping: Goal: Ability to adjust to condition or change in health will improve Outcome: Adequate for Discharge   Problem: Fluid Volume: Goal: Ability to maintain a balanced intake and output will improve Outcome: Adequate for Discharge   Problem: Health Behavior/Discharge Planning: Goal: Ability to identify and utilize available resources and services will improve Outcome: Adequate for Discharge Goal: Ability to manage health-related needs will improve Outcome: Adequate for Discharge   Problem: Metabolic: Goal: Ability to maintain appropriate glucose levels will improve Outcome: Adequate for Discharge   Problem: Nutritional: Goal: Maintenance of adequate nutrition will improve Outcome: Adequate for Discharge Goal: Progress toward achieving an optimal weight will improve Outcome: Adequate for Discharge   Problem: Skin Integrity: Goal: Risk for impaired skin integrity will decrease Outcome: Adequate for Discharge   Problem: Tissue Perfusion: Goal: Adequacy of tissue perfusion will improve Outcome: Adequate for Discharge   Problem: Education: Goal: Knowledge of General Education information will improve Description: Including pain rating scale, medication(s)/side effects and non-pharmacologic comfort measures Outcome: Adequate for Discharge   Problem: Health Behavior/Discharge Planning: Goal: Ability to manage health-related needs will improve Outcome: Adequate for Discharge   Problem: Clinical Measurements: Goal: Ability to maintain clinical measurements within normal limits will improve Outcome: Adequate for Discharge Goal: Will remain free from infection Outcome: Adequate for Discharge Goal:  Diagnostic test results will improve Outcome: Adequate for Discharge Goal: Respiratory complications will improve Outcome: Adequate for Discharge Goal: Cardiovascular complication will be avoided Outcome: Adequate for Discharge   Problem: Activity: Goal: Risk for activity intolerance will decrease Outcome: Adequate for Discharge   Problem: Nutrition: Goal: Adequate nutrition will be maintained Outcome: Adequate for Discharge   Problem: Coping: Goal: Level of anxiety will decrease Outcome: Adequate for Discharge   Problem: Elimination: Goal: Will not experience complications related to bowel motility Outcome: Adequate for Discharge Goal: Will not experience complications related to urinary retention Outcome: Adequate for Discharge   Problem: Pain Managment: Goal: General experience of comfort will improve and/or be controlled Outcome: Adequate for Discharge   Problem: Safety: Goal: Ability to remain free from injury will improve Outcome: Adequate for Discharge   Problem: Skin Integrity: Goal: Risk for impaired skin integrity will decrease Outcome: Adequate for Discharge   Problem: Clinical Measurements: Goal: Ability to avoid or minimize complications of infection will improve Outcome: Adequate for Discharge   Problem: Skin Integrity: Goal: Skin integrity will improve Outcome: Adequate for Discharge

## 2024-07-17 NOTE — Evaluation (Signed)
 Occupational Therapy Evaluation Patient Details Name: [name] MRN: [id number] DOB: [birth year] Today's Date: 07/17/2024   History of Present Illness   [name] is a 58 y.o. 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     Clinical Impressions Patient was seen for OT evaluation this date. At baseline, patient is independent with ADLs/IADLs, driving, working full time at [employer] and going to the gym M-F. Patient is instructed on NWB to RLE, can use heel for tranfsers only; patient has been ambulating, going up/down 19 steps to apartment, working and walking with intermittent use of SPC. He has a knee scooter that he uses intermittently. He has been non compliant with WB instructions stating if my work thinks I can't fulfill my duties of my job, they will fire me. OT voiced concern that ulcer/wound will not heal and likely progress if he is not able to manage WB restrictions, he states understanding. Patient lives in a second story apartment without an engineer, structural; patient lives alone and has intermittent support/assist from family. OT provided kneed scooter, he demonstrated LB dressing, grooming while standing at sink and toileting without any A; his biggest barrier is going to be compliance with WB instructions, he does not require further OT, OT to sign off.      If plan is discharge home, recommend the following:   Help with stairs or ramp for entrance     Functional Status Assessment   Patient has not had a recent decline in their functional status     Equipment Recommendations   None recommended by OT      Recommendations for Other Services         Precautions/Restrictions   Precautions Precautions: Fall Recall of Precautions/Restrictions: Intact Restrictions Weight Bearing Restrictions Per Provider Order: Yes RLE Weight Bearing Per Provider Order: Non weight bearing Other Position/Activity Restrictions: R heel only for transfers     Mobility Bed Mobility Overal bed mobility: Modified Independent                  Transfers Overall transfer level: Modified independent                        Balance Overall balance assessment: No apparent balance deficits (not formally assessed)                                         ADL either performed or assessed with clinical judgement   ADL Overall ADL's : At baseline                                       General ADL Comments: patient is able to manage all ADLs while adhering to weight bearing precautions     Vision         Perception         Praxis         Pertinent Vitals/Pain Pain Assessment Pain Assessment:  0-10 Pain Score: 3  Pain Location: R foot Pain Descriptors / Indicators: Aching Pain Intervention(s): Limited activity within patient's tolerance, Premedicated before session     Extremity/Trunk Assessment Upper Extremity Assessment Upper Extremity Assessment: Overall WFL for tasks assessed   Lower Extremity Assessment Lower Extremity Assessment: Defer to PT evaluation       Communication Communication Communication: No apparent difficulties   Cognition Arousal: Alert Behavior During Therapy: WFL for tasks assessed/performed Cognition: No apparent impairments                               Following commands: Intact       Cueing  General Comments   Cueing Techniques: Verbal cues  knee scooter   Exercises     Shoulder Instructions      Home Living Family/patient expects to be discharged to:: Private residence Living  Arrangements: Alone Available Help at Discharge: Available PRN/intermittently;Family Type of Home: Apartment Home Access: Stairs to enter [employer] of Steps: 19 Entrance Stairs-Rails: Right Home Layout: One level     Bathroom Shower/Tub: Chief Strategy Officer: Standard Bathroom Accessibility: Yes How Accessible: Accessible via walker Home Equipment: Other (comment);Cane - single point (knee scooter)          Prior Functioning/Environment Prior Level of Function : Driving;Working/employed;Independent/Modified Independent             Mobility Comments: SPC/scooter intermittently ADLs Comments: independent with ADLs    OT Problem List:     OT Treatment/Interventions:        OT Goals(Current goals can be found in the care plan section)   Acute Rehab OT Goals Patient Stated Goal: to go home OT Goal Formulation: With patient   OT Frequency:       Co-evaluation              AM-PAC OT 6 Clicks Daily Activity     Outcome Measure Help from another person eating meals?: None Help from another person taking care of personal grooming?: None Help from another person toileting, which includes using toliet, bedpan, or urinal?: None Help from another person bathing (including washing, rinsing, drying)?: None Help from another person to put on and taking off regular upper body clothing?: None Help from another person to put on and taking off regular lower body clothing?: None 6 Click Score: 24   End of Session Equipment Utilized During Treatment: Other (comment) (knee scooter) Nurse Communication: Precautions  Activity Tolerance: Patient tolerated treatment well Patient left: in bed;with nursing/sitter in room  OT Visit Diagnosis: Unsteadiness on feet (R26.81)                Time: 9092-9052 OT Time Calculation (min): 40 min Charges:  OT General Charges $OT Visit: 1 Visit OT Evaluation $OT Eval Low Complexity: 1 Low OT  Treatments $Self Care/Home Management : 38-52 mins  Rogers Clause, OT/L MSOT, 07/17/2024

## 2024-07-17 NOTE — Plan of Care (Signed)

## 2024-07-17 NOTE — Evaluation (Signed)
 Physical Therapy Evaluation Patient Details Name: [name] MRN: [id number] DOB: 1966-08-14 Today's Date: 07/17/2024  History of Present Illness  [name] is a 58 y.o. 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  Clinical Impression  Pt is a very pleasant 58 y.o. male admitted d/t an on-going diabetic R foot infection. Pt was received in bed and was alert and agreeable to working with PT. Pt expresses difficulty maintaining NWB status during his daily life and specifically for his work. Pt able to complete all bed mobility with modified independence and don/doff his shoe and R AFO independently. Pt stood up from bed with supervision and without need for AD. He was able to use the knee scooter to ambulate 15 ft to the stairwell, where he completed 8 steps total-- he started by descending 8 steps while holding onto R handrail and cues for maintaining heel WB only. He then turned around at the landing and ascended 8 steps while holding R handrail. Pt was supervision with step-to pattern for all steps performed. Pt then ambulated 15 ft back to his room with knee scooter where he was left EOB with all belongings within reach. Pt required edu on importance of maintaining WB precautions while his wound heals. Pt is aware that he may need to make a decision regarding amputation in the future, and PT services will be resumed at that time. Pt is currently at his baseline functioning and does not require PT services at this time.      If plan is discharge home, recommend the following: A little help with walking and/or transfers   Can  travel by private vehicle        Equipment Recommendations None recommended by PT  Recommendations for Other Services       Functional Status Assessment Patient has not had a recent decline in their functional status     Precautions / Restrictions Precautions Precautions: Fall Recall of Precautions/Restrictions: Intact Required Braces or Orthoses: Other Brace Other Brace: R AFO Restrictions Weight Bearing Restrictions Per Provider Order: Yes RLE Weight Bearing Per Provider Order: Non weight bearing Other Position/Activity Restrictions: R heel only for transfers      Mobility  Bed Mobility Overal bed mobility: Modified Independent                  Transfers Overall transfer level: Modified independent Equipment used: None               General transfer comment: STS from lowered bed, no AD    Ambulation/Gait Ambulation/Gait assistance: Supervision Gait Distance (Feet): 30 Feet Assistive device: Knee scooter Gait Pattern/deviations: Trunk flexed       General Gait Details: ambulated with R knee scooter 73ft out into hallway to stairwell and 15 ft back after completing stairs with supervision  Stairs Stairs: Yes Stairs assistance: Supervision Stair Management: Two rails, Step to pattern, Forwards Number of Stairs: 8 General stair comments: edu on how to descend stairs while maintaining heel WB only; bilateral rail use; descended 8 steps, turned around and ascended 8 steps back to top of stairwell with step-to pattern and supervision.  Wheelchair  Mobility     Tilt Bed    Modified Rankin (Stroke Patients Only)       Balance Overall balance assessment: No apparent balance deficits (not formally assessed)                                           Pertinent Vitals/Pain Pain Assessment Pain Assessment: No/denies pain    Home Living Family/patient expects to be discharged to:: Private residence Living Arrangements:  Alone Available Help at Discharge: Available PRN/intermittently;Family Type of Home: Apartment Home Access: Stairs to enter Entrance Stairs-Rails: Right Entrance Stairs-Number of Steps: 19   Home Layout: One level Home Equipment: Other (comment);Cane - single point Additional Comments: states he uses a SPC sometimes at work, tries to use scooter for home/weekends    Prior Function Prior Level of Function : Driving;Working/employed;Independent/Modified Independent             Mobility Comments: SPC/scooter intermittently ADLs Comments: independent with ADLs     Extremity/Trunk Assessment   Upper Extremity Assessment Upper Extremity Assessment: Overall WFL for tasks assessed    Lower Extremity Assessment Lower Extremity Assessment: RLE deficits/detail RLE Deficits / Details: RLE TMA; wears AFO, NWB & heel WB for transfers    Cervical / Trunk Assessment Cervical / Trunk Assessment: Kyphotic  Communication   Communication Communication: No apparent difficulties    Cognition Arousal: Alert Behavior During Therapy: WFL for tasks assessed/performed   PT - Cognitive impairments: No apparent impairments                         Following commands: Intact       Cueing Cueing Techniques: Verbal cues     General Comments General comments (skin integrity, edema, etc.): knee scooter    Exercises     Assessment/Plan    PT Assessment Patient does not need any further PT services  PT Problem List         PT Treatment Interventions      PT Goals (Current goals can be found in the Care Plan section)  Acute Rehab PT Goals Patient Stated Goal: to be able to work PT Goal Formulation: With patient Time For Goal Achievement: 07/31/24 Potential to Achieve Goals: Good    Frequency       Co-evaluation               AM-PAC PT 6 Clicks Mobility  Outcome Measure Help needed turning from your back to your side while in a flat bed without using  bedrails?: None Help needed moving from lying on your back to sitting on the side of a flat bed without using bedrails?: None Help needed moving to and from a bed to a chair (including a wheelchair)?: None Help needed standing up from a chair using your arms (e.g., wheelchair or bedside chair)?: None Help needed to walk in hospital room?: None Help needed climbing 3-5 steps with a railing? : None 6 Click Score: 24    End of Session Equipment Utilized During Treatment: Gait belt Activity Tolerance: Patient tolerated treatment well Patient left: in bed;with call bell/phone within reach Nurse Communication: Mobility status PT Visit Diagnosis: Other abnormalities of gait and mobility (R26.89)    Time: 1025-1101 PT Time Calculation (min) (ACUTE ONLY): 36 min   Charges:   PT Evaluation $PT Eval Low Complexity: 1 Low PT Treatments $Gait  Training: 8-22 mins PT General Charges $$ ACUTE PT VISIT: 1 Visit         Allena Bulls, SPT   Allena Bulls 07/17/2024, 1:00 PM

## 2024-07-17 NOTE — Discharge Summary (Signed)
 [name] FMW:[id number] DOB: 1965-10-03 DOA: 07/14/2024  PCP: Marylynn Verneita CROME, MD  Admit date: 07/14/2024 Discharge date: 07/17/2024  Time spent: 35 minutes  Recommendations for Outpatient Follow-up:  Podiatry f/u 2 days     Discharge Diagnoses:  Principal Problem:   Diabetic foot infection (HCC) Active Problems:   Diabetic ulcer of foot associated with diabetes mellitus due to underlying condition, with fat layer exposed (HCC)   Type 2 diabetes mellitus with peripheral neuropathy (HCC)   HTN (hypertension)   Hyperlipidemia associated with type 2 diabetes mellitus (HCC)   Depression with anxiety   Obesity (BMI 30-39.9)   Discharge Condition: stable  Diet recommendation: heart healthy  Filed Weights   07/14/24 1808  Weight: 105.4 kg    History of present illness:   [name] is a 58 y.o. 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   Pt was recently hospitalized from a 11/10 - 11/12 due to infected neuropathic ulcer of right foot with fat layer exposed. MRI showed mild marrow edema likely reactive, but early osteomyelitis cannot be ruled out.  Podiatry was consulted. Bedside debridement was performed by Dr. Tanda 11/11. Pt was treated with was treated with vancomycin  and Zosyn in hospital and discharged on doxycyclines and Augmentin . Pt states that he is taking antibiotics consistently, but his right foot pain, swelling and erythema have been progressively worsening.  No fever or chills.  His right foot pain is intermittent, sharp, 8 out of 10 in severity, nonradiating, not aggravated or alleviated by any known factors.  Patient does not have chest pain, cough, SOB. No nausea, vomiting, diarrhea or abdominal pain. No symptoms of UTI.     Hospital Course:   Here with ongoing right foot  ulcer, no imaging signs of osteo, superficial culture growing s aureus and providence, seen by ID who advises 2 weeks augmentin  (patient has this at home). Seen by podiatry, wound care instructions given, they will follow up with the patient in 2 days, stable for discharge per them. Other chronic conditions stable.  Procedures: none   Consultations: ID, podiatry  Discharge Exam: Vitals:   07/17/24 0249 07/17/24 0751  BP: 131/66 (!) 110/58  Pulse: 66 71  Resp: 18 17  Temp: 98.4 F (36.9 C) 97.6 F (36.4 C)  SpO2: 98% 100%    General: NAD Cardiovascular: RRR Respiratory: CTAB Skin:     Discharge Instructions   Discharge Instructions     Ambulatory referral to Wound Clinic   Complete by: As directed    Non healing right foot wound, follows with podiatry.  Consideration for hyperbaric therapy   Diet - low sodium heart healthy   Complete by: As directed    Discharge wound care:   Complete by: As directed    Cleanse R plantar foot wound with Vashe do not rinse and allow to air dry. Apply Vashe moistened gauze to wound bed daily, cover with dry gauze and secure with Kerlix roll gauze.  Patient should avoid placing weight on area as much as possible.   Increase activity slowly   Complete by: As directed       Allergies as of 07/17/2024   No Known Allergies      Medication List     STOP taking these medications    ciprofloxacin  500 MG tablet Commonly known as: Cipro   doxycycline  100 MG tablet Commonly known as: VIBRA -TABS       TAKE these medications    amoxicillin -clavulanate 875-125 MG tablet Commonly known as: AUGMENTIN  Take 1 tablet by mouth 2 (two) times daily.   aspirin  EC 81 MG tablet Take 81 mg by mouth daily. Swallow whole.   atorvastatin  20 MG tablet Commonly known as: LIPITOR Take 1 tablet (20 mg total) by mouth daily.   DULoxetine  60 MG capsule Commonly known as: CYMBALTA  TAKE 1 CAPSULE BY MOUTH EVERY DAY   metFORMIN  500 MG 24 hr  tablet Commonly known as: GLUCOPHAGE -XR TAKE 1 TABLET BY MOUTH EVERY DAY WITH BREAKFAST   multivitamin with minerals tablet Take 1 tablet by mouth daily.   oxyCODONE  5 MG immediate release tablet Commonly known as: Roxicodone  Take 1.5 tablets (7.5 mg total) by mouth daily. As needed for severe pain   Pen Needles 32G X 6 MM Misc Use to give insulin  once daily.   Sodium Fluoride 5000 Sensitive 1.1-5 % Gel Generic drug: Sod Fluoride-Potassium Nitrate Brush as directed by your provider.   tamsulosin  0.4 MG Caps capsule Commonly known as: FLOMAX  Take 1 capsule (0.4 mg total) by mouth daily.   telmisartan  20 MG tablet Commonly known as: MICARDIS  Take 1 tablet (20 mg total) by mouth daily. TAKE 1 TABLET BY MOUTH EVERYDAY AT BEDTIME   Toujeo  Max SoloStar 300 UNIT/ML Solostar Pen Generic drug: insulin  glargine (2 Unit Dial) Inject 20 Units into the skin daily.               Discharge Care Instructions  (From admission, onward)           Start     Ordered   07/17/24 0000  Discharge wound care:       Comments: Cleanse R plantar foot wound with Vashe do not rinse and allow to air dry. Apply Vashe moistened gauze to wound bed daily, cover with dry gauze and secure with Kerlix roll gauze.  Patient should avoid placing weight on area as much as possible.   07/17/24 1605           No Known Allergies  Follow-up Information     Tanda Greig MATSU, DPM Follow up.   Specialty: Podiatry Why: on 11/21 Contact information: [address] [place] [address] [phone]                  The results of significant diagnostics from this hospitalization (including imaging, microbiology, ancillary and laboratory) are listed below for reference.    Significant Diagnostic Studies: DG Foot Complete Right Result Date: 07/17/2024 CLINICAL DATA:  Infection and amputation. EXAM: RIGHT FOOT COMPLETE - 3+ VIEW COMPARISON:  Right foot x-ray 07/14/2024 FINDINGS: Again  seen are amputations of the mid and distal metatarsals and phalanges. There is no evidence for periosteal reaction or cortical erosion. There is no acute fracture or dislocation. Soft tissues are within normal limits. IMPRESSION: 1. No acute osseous abnormality. No radiographic evidence of osteomyelitis. 2. Stable amputations of the mid and distal metatarsals and phalanges. Electronically Signed   By: Greig Pique M.D.   On: 07/17/2024 15:54   DG Foot Complete Right Result Date: 07/14/2024 CLINICAL DATA:  Cellulitis.  Right foot pain and infection. EXAM: RIGHT FOOT COMPLETE - 3+ VIEW COMPARISON:  07/08/2024. 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 No acute fracture or dislocation is seen. Degenerative changes are noted in the midfoot and hindfoot. There is moderate calcaneal spurring. No  periosteal elevation or bony erosion is seen. Diffuse soft tissue swelling is noted. No subcutaneous emphysema is seen. IMPRESSION: No radiographic findings of osteomyelitis or subcutaneous emphysema. Electronically Signed   By: Leita Birmingham M.D.   On: 07/14/2024 19:07   MR FOOT RIGHT W WO CONTRAST Result Date: 07/09/2024 CLINICAL DATA:  Diabetic foot infection. EXAM: MRI OF THE RIGHT FOREFOOT WITHOUT AND WITH CONTRAST TECHNIQUE: Multiplanar, multisequence MR imaging of the right foot was performed before and after the administration of intravenous contrast. CONTRAST:  10mL GADAVIST  GADOBUTROL  1 MMOL/ML IV SOLN COMPARISON:  Radiographs 07/08/2024 and 02/14/2024.  MRI 07/11/2023. FINDINGS: Bones/Joint/Cartilage Stable postsurgical changes from previous transmetatarsal amputation. Compared with the previous MRI, there has been interval improvement in the previously demonstrated marrow edema and enhancement in the medial forefoot, involving the navicular, cuneiform bones and bases of the metatarsals. The medial and middle cuneiform bones appear ankylosed. There is new marrow edema and  low level enhancement within the cuboid. No bone destruction, cortical fracture or dislocation identified. No significant joint effusions or abnormal synovial enhancement. Stable mild midfoot degenerative changes. Ligaments The anterior talofibular ligament appears chronically attenuated, but intact. No acute ligamentous findings are identified within the hindfoot or midfoot. Muscles and Tendons The ankle tendons appear intact, without significant tenosynovitis. Generalized foot muscular atrophy. Soft tissues Progressive soft tissue ulceration along the inferior medial aspect of the midfoot and remaining forefoot. Associated surrounding skin thickening, subcutaneous edema and enhancement. No focal fluid collection or definite soft tissue emphysema demonstrated. IMPRESSION: 1. Progressive soft tissue ulceration along the inferior medial aspect of the midfoot and remaining forefoot with surrounding cellulitis. No focal fluid collection or definite soft tissue emphysema. 2. Interval improvement in previously demonstrated marrow edema and enhancement in the medial forefoot, involving the navicular, cuneiform bones and bases of the metatarsals. There is new marrow edema and low level enhancement within the cuboid. These findings are nonspecific and could be reactive or secondary to early osteomyelitis. 3. No evidence of septic arthritis or septic tenosynovitis. Electronically Signed   By: Elsie Perone M.D.   On: 07/09/2024 08:18   DG Foot Complete Right Result Date: 07/08/2024 EXAM: 3 OR MORE VIEW(S) XRAY OF THE RIGHT FOOT 07/08/2024 06:24:20 PM COMPARISON: 02/14/2024 CLINICAL HISTORY: Diabetic foot ulcer, evaluate for evidence of underlying osteomyelitis. 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 Diffuse osteopenia. Moderate midfoot osteoarthritis unchanged. No acute fracture. No focal osseous lesion. No joint dislocation. SOFT TISSUES: Diffuse soft tissue edema  throughout the foot with possible subcutaneous gas on the AP and oblique views. IMPRESSION: 1. Diffuse soft tissue edema throughout the foot with possible subcutaneous gas on the AP and oblique views. This could represent subcutaneous gas from a gas-producing infection or artifact related to large ulceration. Correlation with physical exam findings requested. 2. No radiographic findings to suggest underlying osteomyelitis. Electronically signed by: Rogelia Myers MD 07/08/2024 06:49 PM EST RP Workstation: [id number]   US  Venous Img Lower Unilateral Right (DVT) Result Date: 07/08/2024 CLINICAL DATA:  58 year old male with right lower extremity pain and swelling. EXAM: RIGHT LOWER EXTREMITY VENOUS DOPPLER ULTRASOUND TECHNIQUE: Gray-scale sonography with graded compression, as well as color Doppler and duplex ultrasound were performed to evaluate the right lower extremity deep venous systems from the level of the common femoral vein and including the common femoral, femoral, profunda femoral, popliteal and calf veins including the posterior tibial, peroneal and gastrocnemius veins when visible. Spectral Doppler was utilized to evaluate flow at rest and with  distal augmentation maneuvers in the common femoral, femoral and popliteal veins. The contralateral common femoral vein was also evaluated for comparison. COMPARISON:  02/15/2024 FINDINGS: RIGHT LOWER EXTREMITY Common Femoral Vein: No evidence of thrombus. Normal compressibility, respiratory phasicity and response to augmentation. Central Greater Saphenous Vein: No evidence of thrombus. Normal compressibility and flow on color Doppler imaging. Central Profunda Femoral Vein: No evidence of thrombus. Normal compressibility and flow on color Doppler imaging. Femoral Vein: No evidence of thrombus. Normal compressibility, respiratory phasicity and response to augmentation. Popliteal Vein: No evidence of thrombus. Normal compressibility, respiratory phasicity and  response to augmentation. Calf Veins: No evidence of thrombus. Normal compressibility and flow on color Doppler imaging. Other Findings:  None. LEFT LOWER EXTREMITY Common Femoral Vein: No evidence of thrombus. Normal compressibility, respiratory phasicity and response to augmentation. IMPRESSION: No evidence of right lower extremity deep venous thrombosis. Ester Sides, MD [facility] Electronically Signed   By: Ester Sides M.D.   On: 07/08/2024 16:18    Microbiology: Recent Results (from the past 240 hours)  WOUND CULTURE     Status: None (Preliminary result)   Collection Time: 07/08/24  9:47 AM   Specimen: Wound  Result Value Ref Range Status   MICRO NUMBER: [id number]  Preliminary   SPECIMEN QUALITY: Adequate  Preliminary   SOURCE: LEFT ULCER  Preliminary   STATUS: PRELIMINARY  Preliminary   GRAM STAIN:   Preliminary    No white blood cells seen No epithelial cells seen No organisms seen  Culture aerobic bacteria pos     Status: Abnormal   Collection Time: 07/08/24  9:47 AM  Result Value Ref Range Status   MICRO NUMBER: [id number]  Final   SPECIMEN QUALITY: Adequate  Final   SOURCE: LEFT ULCER  Final   STATUS: FINAL  Final   GRAM STAIN:   Final    No white blood cells seen No epithelial cells seen No organisms seen   ISOLATE 1: Providencia rettgeri (A)  Final    Comment: Light growth of Providencia rettgeri   ISOLATE 2: Staphylococcus aureus (A)  Final    Comment: Heavy growth of Staphylococcus aureus      Susceptibility   Providencia rettgeri - AEROBIC CULT, GRAM STAIN NEGATIVE 1    AMPICILLIN /SULBACTAM <=2 Sensitive     CEFAZOLIN * >=32 Resistant      * For infections other than uncomplicated UTI caused by E. coli, K. pneumoniae or P. mirabilis: Cefazolin  is resistant if MIC > or = 8 mcg/mL. (Distinguishing susceptible versus intermediate for isolates with MIC < or = 4 mcg/mL requires additional testing.)      CEFTAZIDIME <=0.5 Sensitive     CEFEPIME  <=0.12 Sensitive     CEFTRIAXONE  <=0.25 Sensitive     CIPROFLOXACIN  <=0.06 Sensitive     LEVOFLOXACIN  <=0.12 Sensitive     GENTAMICIN <=1 Sensitive     MEROPENEM <=0.25 Sensitive     PIP/TAZO <=4 Sensitive     TRIMETH /SULFA  >=320 Resistant    Staphylococcus aureus - AEROBIC CULT, GRAM STAIN POSITIVE 2    VANCOMYCIN  <=0.5 Sensitive     CLINDAMYCIN <=0.25 Sensitive     ERYTHROMYCIN <=0.25 Sensitive     OXACILLIN* 0.5 Sensitive      * For infections other than uncomplicated UTI caused by E. coli, K. pneumoniae or P. mirabilis: Cefazolin  is resistant if MIC > or = 8 mcg/mL. (Distinguishing susceptible versus intermediate for isolates with MIC < or = 4 mcg/mL requires additional testing.)  Oxacillin susceptible staphylococci are susceptible to other penicillinase-stable penicillins (e.g., methicillin, nafcillin), beta- lactam/beta-lactamase inhibitor combinations, and cephems with staphylococcal indications, including cefazolin .     CIPROFLOXACIN  <=0.5 Sensitive     LEVOFLOXACIN  0.25 Sensitive     GENTAMICIN <=0.5 Sensitive     TETRACYCLINE <=1 Sensitive     TRIMETH /SULFA  <=10 Sensitive     MOXIFLOXACIN* <=0.25 Sensitive      * For infections other than uncomplicated UTI caused by E. coli, K. pneumoniae or P. mirabilis: Cefazolin  is resistant if MIC > or = 8 mcg/mL. (Distinguishing susceptible versus intermediate for isolates with MIC < or = 4 mcg/mL requires additional testing.) Oxacillin susceptible staphylococci are susceptible to other penicillinase-stable penicillins (e.g., methicillin, nafcillin), beta- lactam/beta-lactamase inhibitor combinations, and cephems with staphylococcal indications, including cefazolin . Legend: S = Susceptible  I = Intermediate R = Resistant  NS = Not susceptible SDD = Susceptible Dose Dependent * = Not Tested  NR = Not Reported **NN = See Therapy Comments   Blood culture (routine x 2)     Status:  None   Collection Time: 07/08/24  7:30 PM   Specimen: BLOOD  Result Value Ref Range Status   Specimen Description BLOOD LEFT ANTECUBITAL  Final   Special Requests   Final    BOTTLES DRAWN AEROBIC AND ANAEROBIC Blood Culture adequate volume   Culture   Final    NO GROWTH 5 DAYS Performed at [facility], [address], [place], [place] [address]    Report Status 07/13/2024 FINAL  Final  Culture, blood (routine x 2)     Status: None (Preliminary result)   Collection Time: 07/14/24  6:40 PM   Specimen: BLOOD  Result Value Ref Range Status   Specimen Description BLOOD RIGHT ANTECUBITAL  Final   Special Requests   Final    BOTTLES DRAWN AEROBIC AND ANAEROBIC Blood Culture results may not be optimal due to an inadequate volume of blood received in culture bottles   Culture   Final    NO GROWTH 3 DAYS Performed at [facility], [address] [place], [place] [address]    Report Status PENDING  Incomplete  Culture, blood (routine x 2)     Status: None (Preliminary result)   Collection Time: 07/14/24  7:54 PM   Specimen: BLOOD  Result Value Ref Range Status   Specimen Description BLOOD BLOOD LEFT ARM  Final   Special Requests   Final    BOTTLES DRAWN AEROBIC AND ANAEROBIC Blood Culture results may not be optimal due to an inadequate volume of blood received in culture bottles   Culture   Final    NO GROWTH 3 DAYS Performed at [facility], [address] [place], [place] [address]    Report Status PENDING  Incomplete     Labs: Basic Metabolic Panel: Recent Labs  Lab 07/14/24 1832 07/15/24 0439 07/17/24 0518  NA 137 138 138  K 4.4 4.2 4.2  CL 101 106 104  CO2 25 26 28   GLUCOSE 260* 136* 117*  BUN 19 16 11   CREATININE 0.93 0.78 0.86  CALCIUM  9.0 8.2* 8.2*   Liver Function Tests: No results for input(s): AST, ALT, ALKPHOS, BILITOT, PROT, ALBUMIN in the last 168 hours. No results for input(s): LIPASE, AMYLASE in the last 168  hours. No results for input(s): AMMONIA in the last 168 hours. CBC: Recent Labs  Lab 07/14/24 1832 07/15/24 0439 07/17/24 0518  WBC 10.2 8.0 6.2  NEUTROABS 6.5  --  2.4  HGB 14.6 13.2 13.0  HCT 44.8 41.6 40.1  MCV 88.5 90.2 88.5  PLT 321 276 253   Cardiac Enzymes: No results for input(s): CKTOTAL, CKMB, CKMBINDEX, TROPONINI in the last 168 hours. BNP: BNP (last 3 results) No results for input(s): BNP in the last 8760 hours.  ProBNP (last 3 results) No results for input(s): PROBNP in the last 8760 hours.  CBG: Recent Labs  Lab 07/16/24 1116 07/16/24 1723 07/16/24 2123 07/17/24 0752 07/17/24 1131  GLUCAP 189* 158* 126* 154* 170*       Signed:  Devaughn KATHEE Ban MD.  [facility] 07/17/2024, 4:10 PM

## 2024-07-17 NOTE — TOC Transition Note (Signed)
 Transition of Care [facility]) - Discharge Note   Patient Details  Name: [name] MRN: [id number] Date of Birth: 1965-10-23  Transition of Care [facility]) CM/SW Contact:  Daved JONETTA Hamilton, RN Phone Number: 07/17/2024, 4:49 PM   Clinical Narrative:     Patient will DC to: Home with Self Care Anticipated DC date: 07/17/2024 Transport by: Patient to drive self home  Per MD patient ready for DC to home. Patient notified of DC and verbalized no questions or needs.  TOC signing off.   Final next level of care: Home/Self Care Barriers to Discharge: Barriers Resolved   Patient Goals and CMS Choice            Discharge Placement                    Patient and family notified of of transfer: 07/17/24  Discharge Plan and Services Additional resources added to the After Visit Summary for                                       Social Drivers of Health (SDOH) Interventions SDOH Screenings   Food Insecurity: Food Insecurity Present (07/14/2024)  Housing: Low Risk  (07/14/2024)  Recent Concern: Housing - High Risk (06/10/2024)  Transportation Needs: No Transportation Needs (07/14/2024)  Utilities: Not At Risk (07/14/2024)  Depression (PHQ2-9): High Risk (06/11/2024)  Financial Resource Strain: High Risk (06/10/2024)  Physical Activity: Sufficiently Active (06/10/2024)  Social Connections: Socially Isolated (06/10/2024)  Stress: Stress Concern Present (06/10/2024)  Tobacco Use: Low Risk  (07/14/2024)     Readmission Risk Interventions     No data to display

## 2024-07-17 NOTE — TOC Progression Note (Addendum)
 Transition of Care [facility]) - Progression Note    Patient Details  Name: [name] MRN: [id number] Date of Birth: 16-Aug-1966  [facility]) CM/SW Contact  Daved JONETTA Hamilton, RN Phone Number: 07/17/2024, 8:21 AM  Clinical Narrative:     LATE ENTRY:  TOC met with patient, introduced self and reason for visit. Discussed with patient possible discharge needs pending hospital course and plan of care. Patient verbalized he will continue to perform his own wound care and dressing changes once discharged home, pending hospital course. Patient verbalized he currently has a cane, rolling knee scooter, and rolling walker at home. Patient verbalized he is open to assistance at discharge if recommended by therapies or providers, if surgical interventions are required.    TOC will continue to follow thru hospital course for possible discharge needs.                   Expected Discharge Plan and Services                                               Social Drivers of Health (SDOH) Interventions SDOH Screenings   Food Insecurity: Food Insecurity Present (07/14/2024)  Housing: Low Risk  (07/14/2024)  Recent Concern: Housing - High Risk (06/10/2024)  Transportation Needs: No Transportation Needs (07/14/2024)  Utilities: Not At Risk (07/14/2024)  Depression (PHQ2-9): High Risk (06/11/2024)  Financial Resource Strain: High Risk (06/10/2024)  Physical Activity: Sufficiently Active (06/10/2024)  Social Connections: Socially Isolated (06/10/2024)  Stress: Stress Concern Present (06/10/2024)  Tobacco Use: Low Risk  (07/14/2024)    Readmission Risk Interventions     No data to display

## 2024-07-18 ENCOUNTER — Encounter: Payer: Self-pay | Admitting: Internal Medicine

## 2024-07-18 ENCOUNTER — Telehealth: Payer: Self-pay

## 2024-07-18 MED ORDER — OXYCODONE HCL 5 MG PO TABS: 7.5000 mg | ORAL_TABLET | Freq: Every day | ORAL | 0 refills | Status: DC

## 2024-07-18 NOTE — Transitions of Care (Post Inpatient/ED Visit) (Signed)
   07/18/2024  Name: [name] MRN: [id number] DOB: 28-Jul-1966  Today's TOC FU Call Status: Today's TOC FU Call Status:: Unsuccessful Call (1st Attempt) Unsuccessful Call (1st Attempt) Date: 07/18/24  Attempted to reach the patient regarding the most recent Inpatient/ED visit.  Follow Up Plan: Additional outreach attempts will be made to reach the patient to complete the Transitions of Care (Post Inpatient/ED visit) call.   Arvin Seip RN, BSN, CCM [employer], Population Health Case Manager Phone: [phone]

## 2024-07-19 ENCOUNTER — Telehealth: Payer: Self-pay

## 2024-07-19 LAB — CULTURE, BLOOD (ROUTINE X 2)
Culture: NO GROWTH
Culture: NO GROWTH

## 2024-07-19 NOTE — Transitions of Care (Post Inpatient/ED Visit) (Signed)
   07/19/2024  Name: [name] MRN: [id number] DOB: 01/19/1966  Today's TOC FU Call Status: Today's TOC FU Call Status:: Unsuccessful Call (2nd Attempt) Unsuccessful Call (2nd Attempt) Date: 07/19/24  Attempted to reach the patient regarding the most recent Inpatient/ED visit.  Follow Up Plan: Additional outreach attempts will be made to reach the patient to complete the Transitions of Care (Post Inpatient/ED visit) call.   Christan Ciccarelli J. Vaanya Shambaugh RN, MSN [place]  [facility], [facility] RN Care Manager Direct Dial : [phone]  Fax: [phone] Website: [email or web address]

## 2024-07-22 ENCOUNTER — Other Ambulatory Visit: Payer: Self-pay | Admitting: Internal Medicine

## 2024-07-22 ENCOUNTER — Ambulatory Visit: Admitting: Internal Medicine

## 2024-07-22 ENCOUNTER — Encounter: Payer: Self-pay | Admitting: Internal Medicine

## 2024-07-22 ENCOUNTER — Inpatient Hospital Stay: Admitting: Internal Medicine

## 2024-07-22 ENCOUNTER — Telehealth: Payer: Self-pay

## 2024-07-22 VITALS — BP 128/68 | HR 90 | Ht 69.0 in | Wt 228.0 lb

## 2024-07-22 DIAGNOSIS — Z794 Long term (current) use of insulin: Secondary | ICD-10-CM

## 2024-07-22 DIAGNOSIS — F418 Other specified anxiety disorders: Secondary | ICD-10-CM

## 2024-07-22 DIAGNOSIS — L089 Local infection of the skin and subcutaneous tissue, unspecified: Secondary | ICD-10-CM

## 2024-07-22 DIAGNOSIS — E669 Obesity, unspecified: Secondary | ICD-10-CM

## 2024-07-22 DIAGNOSIS — G4733 Obstructive sleep apnea (adult) (pediatric): Secondary | ICD-10-CM

## 2024-07-22 DIAGNOSIS — M86671 Other chronic osteomyelitis, right ankle and foot: Secondary | ICD-10-CM | POA: Diagnosis not present

## 2024-07-22 DIAGNOSIS — F331 Major depressive disorder, recurrent, moderate: Secondary | ICD-10-CM

## 2024-07-22 DIAGNOSIS — E119 Type 2 diabetes mellitus without complications: Secondary | ICD-10-CM

## 2024-07-22 DIAGNOSIS — E11628 Type 2 diabetes mellitus with other skin complications: Secondary | ICD-10-CM | POA: Diagnosis not present

## 2024-07-22 DIAGNOSIS — Z09 Encounter for follow-up examination after completed treatment for conditions other than malignant neoplasm: Secondary | ICD-10-CM

## 2024-07-22 DIAGNOSIS — E1165 Type 2 diabetes mellitus with hyperglycemia: Secondary | ICD-10-CM

## 2024-07-22 DIAGNOSIS — Z7985 Long-term (current) use of injectable non-insulin antidiabetic drugs: Secondary | ICD-10-CM

## 2024-07-22 DIAGNOSIS — I10 Essential (primary) hypertension: Secondary | ICD-10-CM

## 2024-07-22 MED ORDER — TOUJEO MAX SOLOSTAR 300 UNIT/ML ~~LOC~~ SOPN: 25.0000 [IU] | PEN_INJECTOR | Freq: Every day | SUBCUTANEOUS | 1 refills | Status: DC

## 2024-07-22 MED ORDER — TIRZEPATIDE 5 MG/0.5ML ~~LOC~~ SOAJ: 5.0000 mg | SUBCUTANEOUS | 2 refills | Status: AC

## 2024-07-22 MED ORDER — BUPROPION HCL ER (XL) 150 MG PO TB24: 150.0000 mg | ORAL_TABLET | Freq: Every day | ORAL | 1 refills | Status: DC

## 2024-07-22 MED ORDER — DULOXETINE HCL 30 MG PO CPEP: 30.0000 mg | ORAL_CAPSULE | Freq: Every day | ORAL | 1 refills | Status: DC

## 2024-07-22 NOTE — Transitions of Care (Post Inpatient/ED Visit) (Signed)
   07/22/2024  Name: [name] MRN: [id number] DOB: 1965-11-14  Today's TOC FU Call Status: Today's TOC FU Call Status:: Unsuccessful Call (3rd Attempt) Unsuccessful Call (3rd Attempt) Date: 07/22/24  Attempted to reach the patient regarding the most recent Inpatient/ED visit.  Follow Up Plan: No further outreach attempts will be made at this time. We have been unable to contact the patient.  Shona Prow RN, CCM [place]  [facility]Population [facility] RN Care Manager [phone]

## 2024-07-22 NOTE — Assessment & Plan Note (Signed)
 Uncontrolled,  with weight gain on current regimen of Toujeo  and metformin .  Patient has been unable to lose or maintain a healthy weight  Screened for contraindications to use of  GLP 1 agonists for appetite suppression and he has none.  The risks and benefits of pharmacotherapy discussed .  Taking Mounjaro  with starting dose samples given of 2.5 mg weekly      Lab Results  Component Value Date   HGBA1C 8.1 (H) 06/07/2024   Lab Results  Component Value Date   LABMICR See below: 02/01/2024   LABMICR See below: 08/03/2022   MICROALBUR 15.4 (H) 11/03/2023

## 2024-07-22 NOTE — Progress Notes (Unsigned)
 Subjective:  Patient ID: [name], male    DOB: Nov 26, 1965  Age: 58 y.o. MRN: [id number]  CC: There were no encounter diagnoses.   HPI [name] presents for  Chief Complaint  Patient presents with  . 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  He has had 2 admission in NOvember and was most recently admitted to [facility]  on Nov 16 and discharged on Nov 19 .  He presented with cellulitis of the right foot following  debridement which was done by podiatry during Nov 11-12 hospitalization.  He started feeling poorly,  and noted that the dorsal surface of foot became wam and red so he returned to hospital . ID consult included Wound and blood cultures ,  and abx therapy was changed from IV broad spectrum to augmentin  x 2 weeks  and at discharge was advised to avoid prlonged periods of weight bearing and follow up with podiatry. . After discharge he followed up with his new podiatrist  Dr Tanda at [facility] on Friday ,   who has ordered different shoe,  different brace,  different insert.   He is taking augmentin  and eating yogurt daily as his probiotic .  Discharge wound care:   Complete by: As directed       Cleanse R plantar foot wound with Vashe do not rinse and allow to air dry. Apply Vashe moistened gauze to wound bed daily, cover with dry gauze and secure with Kerlix roll gauze.  Patient should avoid placing weight on area as much as possible.    Increase activity slowly   Complete by: As directed   He is offloading his foot at home but at work he is standing and walking for at least 3 hours of  his 9 hour shift at the [employer].  He is not actively looking for a job that will allow him to use a scooter   2) Positive depression screen: he notes that his mood is improved when he is surrounded by people and worse when he is  alone.  He is taking cymbalta  60 mg and notes persistent fatigue.  He has multiple family members who are supportive and expresses gratitude that his foot id not have to be amputated.   3) Uncontrolled type 2 DM: last A1c was 8.1 on Oct 10,  cbg data unable to be down loaded from his  reader  excpet for the weekly average; his BS for the last 7 days have  avg glu 172,  33 % above  67%  in range . Currently taking metformin  and 20 units of Toujeo .    Outpatient Medications Prior to Visit  Medication Sig Dispense Refill  . amoxicillin -clavulanate (AUGMENTIN ) 875-125 MG tablet Take 1 tablet by mouth 2 (two) times daily.    . aspirin  EC 81 MG tablet Take 81 mg by mouth daily. Swallow whole.    . atorvastatin  (LIPITOR) 20 MG tablet Take 1 tablet (20 mg total) by mouth daily. 90 tablet 3  . DULoxetine  (CYMBALTA ) 60 MG capsule TAKE 1 CAPSULE BY MOUTH EVERY DAY 90 capsule 2  . insulin  glargine, 2 Unit Dial , (TOUJEO  MAX SOLOSTAR) 300 UNIT/ML Solostar Pen Inject 20 Units into the skin daily. 15 mL 1  . Insulin  Pen Needle (PEN NEEDLES) 32G X 6 MM MISC Use  to give insulin  once daily. 100 each 3  . metFORMIN  (GLUCOPHAGE -XR) 500 MG 24 hr tablet TAKE 1 TABLET BY MOUTH EVERY DAY WITH BREAKFAST 90 tablet 1  . Multiple Vitamins-Minerals (MULTIVITAMIN WITH MINERALS) tablet Take 1 tablet by mouth daily.    . oxyCODONE  (ROXICODONE ) 5 MG immediate release tablet Take 1.5 tablets (7.5 mg total) by mouth daily. As needed for severe pain 45 tablet 0  . SODIUM FLUORIDE 5000 SENSITIVE 1.1-5 % GEL Brush as directed by your provider.    . tamsulosin  (FLOMAX ) 0.4 MG CAPS capsule Take 1 capsule (0.4 mg total) by mouth daily. 30 capsule 0  . telmisartan  (MICARDIS ) 20 MG tablet Take 1 tablet (20 mg total) by mouth daily. TAKE 1 TABLET BY MOUTH EVERYDAY AT BEDTIME 90 tablet 1   No facility-administered medications prior to visit.    Review of Systems;  Patient denies headache, fevers, malaise, unintentional weight loss, skin  rash, eye pain, sinus congestion and sinus pain, sore throat, dysphagia,  hemoptysis , cough, dyspnea, wheezing, chest pain, palpitations, orthopnea, edema, abdominal pain, nausea, melena, diarrhea, constipation, flank pain, dysuria, hematuria, urinary  Frequency, nocturia, numbness, tingling, seizures,  Focal weakness, Loss of consciousness,  Tremor, insomnia, depression, anxiety, and suicidal ideation.      Objective:  BP 132/66   Pulse 90   Ht 5' 9 (1.753 m)   Wt 228 lb (103.4 kg)   SpO2 97%   BMI 33.67 kg/m   BP Readings from Last 3 Encounters:  07/22/24 132/66  07/17/24 125/79  07/10/24 119/64    Wt Readings from Last 3 Encounters:  07/22/24 228 lb (103.4 kg)  07/14/24 232 lb 6.4 oz (105.4 kg)  07/09/24 232 lb 6.4 oz (105.4 kg)    Physical Exam Vitals reviewed.  Constitutional:      General: He is not in acute distress.    Appearance: Normal appearance. He is normal weight. He is not ill-appearing, toxic-appearing or diaphoretic.  HENT:     Head: Normocephalic.  Eyes:     General: No scleral icterus.       Right eye: No discharge.        Left eye: No discharge.     Conjunctiva/sclera: Conjunctivae normal.  Cardiovascular:     Rate and Rhythm: Normal rate and regular rhythm.     Heart sounds: Normal heart sounds.  Pulmonary:     Effort: Pulmonary effort is normal. No respiratory distress.     Breath sounds: Normal breath sounds.  Musculoskeletal:        General: Normal range of motion.     Cervical back: Normal range of motion.  Skin:    General: Skin is warm and dry.  Neurological:     General: No focal deficit present.     Mental Status: He is alert and oriented to person, place, and time. Mental status is at baseline.  Psychiatric:        Mood and Affect: Mood normal.        Behavior: Behavior normal.        Thought Content: Thought content normal.        Judgment: Judgment normal.     Lab Results  Component Value Date   HGBA1C 8.1 (H) 06/07/2024    HGBA1C 6.7 (H) 02/15/2024   HGBA1C 9.6 (H) 11/03/2023    Lab Results  Component Value Date   CREATININE 0.86 07/17/2024   CREATININE 0.78 07/15/2024   CREATININE 0.93 07/14/2024    Lab Results  Component  Value Date   WBC 6.2 07/17/2024   HGB 13.0 07/17/2024   HCT 40.1 07/17/2024   PLT 253 07/17/2024   GLUCOSE 117 (H) 07/17/2024   CHOL 107 06/07/2024   TRIG 56.0 06/07/2024   HDL 38.80 (L) 06/07/2024   LDLDIRECT 53.0 06/07/2024   LDLCALC 57 06/07/2024   ALT 29 07/08/2024   AST 31 07/08/2024   NA 138 07/17/2024   K 4.2 07/17/2024   CL 104 07/17/2024   CREATININE 0.86 07/17/2024   BUN 11 07/17/2024   CO2 28 07/17/2024   TSH 0.81 11/03/2023   PSA 0.18 02/22/2022   INR 1.0 07/14/2024   HGBA1C 8.1 (H) 06/07/2024   MICROALBUR 15.4 (H) 11/03/2023    DG Foot Complete Right Result Date: 07/14/2024 CLINICAL DATA:  Cellulitis.  Right foot pain and infection. EXAM: RIGHT FOOT COMPLETE - 3+ VIEW COMPARISON:  07/08/2024. 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 No acute fracture or dislocation is seen. Degenerative changes are noted in the midfoot and hindfoot. There is moderate calcaneal spurring. No periosteal elevation or bony erosion is seen. Diffuse soft tissue swelling is noted. No subcutaneous emphysema is seen. IMPRESSION: No radiographic findings of osteomyelitis or subcutaneous emphysema. Electronically Signed   By: Leita Birmingham M.D.   On: 07/14/2024 19:07    Assessment & Plan:  .There are no diagnoses linked to this encounter.   I spent 34 minutes on the day of this face to face encounter reviewing patient's  most recent visit with cardiology,  nephrology,  and neurology,  prior relevant surgical and non surgical procedures, recent  labs and imaging studies, counseling on weight management,  reviewing the assessment and plan with patient, and post visit ordering and reviewing of  diagnostics and therapeutics with patient  .    Follow-up: No follow-ups on file.   Verneita LITTIE Kettering, MD

## 2024-07-22 NOTE — Patient Instructions (Addendum)
 Increase the insulin  dose to 25 units daily   Increase Mounjaro  to 5 mg weekly  Adding wellbutrin  once daily for your depression.  In 2 weeks reduce your dose of cymbalta  to 30 mg daily (new rx sent)    Follow up in one month      If the cost of the CPAP through April is still too high,  let me know and I 'll make a referral to Dr Jess.  The sleep specialist

## 2024-07-22 NOTE — Assessment & Plan Note (Addendum)
 MODERATE TO SEVERE SLEEP APNEA BY REPEAT STUDY DONE OVER 2 NIGHTS  APRIL 2025 .  AUTOTITRATING CPAP WAS ORDERED but not started because of the COPAY requiringn $400 due to patient's financial situation

## 2024-07-22 NOTE — Assessment & Plan Note (Signed)
 Symptoms have been aggravated by marital estrangement , financial strain, and impending loss of right foot due to osteomyelitis.  He is taking cymbalta  60 mg daily,  discussed adding wellbutrin  . He has several members who he is in contact with.

## 2024-07-23 ENCOUNTER — Other Ambulatory Visit (HOSPITAL_COMMUNITY): Payer: Self-pay

## 2024-07-23 ENCOUNTER — Telehealth: Payer: Self-pay | Admitting: Pharmacy Technician

## 2024-07-23 ENCOUNTER — Encounter: Payer: Self-pay | Admitting: Internal Medicine

## 2024-07-23 NOTE — Assessment & Plan Note (Signed)
 Advised to increase Toueo to 25 units daily and increase Mounjaro  to 7.5 mg weekly.  DEXCOM data could not be accessed due to software issues

## 2024-07-23 NOTE — Assessment & Plan Note (Signed)
 Aggravated by his recurrent hospitalizations and inabiity to heal wound.  Adding wellbutrin  with starting dose of 150 mg daily,  will reduce cymbalta  to 40 mg daily . RTC one month

## 2024-07-23 NOTE — Telephone Encounter (Signed)
 Pharmacy Patient Advocate Encounter   Received notification from Onbase that prior authorization for Mounjaro  5MG /0.5ML auto-injectors  is required/requested.   Insurance verification completed.   The patient is insured through [facility].   Per test claim: PA required; PA submitted to above mentioned insurance via Latent Key/confirmation #/EOC [id number] Status is pending

## 2024-07-23 NOTE — Assessment & Plan Note (Signed)
 Suggested by MRI done Nov 10 during prior admission

## 2024-07-23 NOTE — Telephone Encounter (Signed)
Alternative Requested:NOT COVERED BY INSURANCE. 

## 2024-07-23 NOTE — Assessment & Plan Note (Signed)
 Continue telmisartan  20 mg daily.  Home readings have been < 130/80

## 2024-07-23 NOTE — Assessment & Plan Note (Signed)
Patient is stable post discharge and has no new issues or questions about discharge plans at the visit today for hospital follow up.  I have reviewed the records from the hospital admission in detail with patient today. 

## 2024-07-23 NOTE — Assessment & Plan Note (Signed)
 Elevations are likely aggravated by infection of diabetic foot.  Increasing Toujeo  to 25 units.  Follow up one month

## 2024-07-23 NOTE — Telephone Encounter (Signed)
 Pharmacy Patient Advocate Encounter  Received notification from [facility] that Prior Authorization for Mounjaro  5MG /0.5ML auto-injectors  has been APPROVED from 07/23/24 to 07/24/27   PA #/Case ID/Reference #: [id number]

## 2024-08-12 ENCOUNTER — Telehealth: Admitting: Family Medicine

## 2024-08-12 DIAGNOSIS — J019 Acute sinusitis, unspecified: Secondary | ICD-10-CM | POA: Diagnosis not present

## 2024-08-12 DIAGNOSIS — B9689 Other specified bacterial agents as the cause of diseases classified elsewhere: Secondary | ICD-10-CM

## 2024-08-12 MED ORDER — DOXYCYCLINE HYCLATE 100 MG PO TABS: 100.0000 mg | ORAL_TABLET | Freq: Two times a day (BID) | ORAL | 0 refills | Status: DC

## 2024-08-12 MED ORDER — FLUTICASONE PROPIONATE 50 MCG/ACT NA SUSP: 2.0000 | Freq: Every day | NASAL | 0 refills | Status: DC

## 2024-08-12 NOTE — Progress Notes (Signed)
 E-Visit for Sinus Problems  We are sorry that you are not feeling well.  Here is how we plan to help!  Based on what you have shared with me it looks like you have sinusitis.  Sinusitis is inflammation and infection in the sinus cavities of the head.  Based on your presentation I believe you most likely have Acute Bacterial Sinusitis.  This is an infection caused by bacteria and is treated with antibiotics. I have prescribed Doxycycline  100mg  by mouth twice a day for 7 days. and I have also prescribed Flonase  Nasal Spray Use 2 sprays in each nostril daily for 10-14 days You may use an oral decongestant such as Mucinex D or if you have glaucoma or high blood pressure use plain Mucinex. Saline nasal spray help and can safely be used as often as needed for congestion.  If you develop worsening sinus pain, fever or notice severe headache and vision changes, or if symptoms are not better after completion of antibiotic, please schedule an appointment with a health care provider.    Sinus infections are not as easily transmitted as other respiratory infection, however we still recommend that you avoid close contact with loved ones, especially the very young and elderly.  Remember to wash your hands thoroughly throughout the day as this is the number one way to prevent the spread of infection!  Home Care: Only take medications as instructed by your medical team. Complete the entire course of an antibiotic. Do not take these medications with alcohol. A steam or ultrasonic humidifier can help congestion.  You can place a towel over your head and breathe in the steam from hot water coming from a faucet. Avoid close contacts especially the very young and the elderly. Cover your mouth when you cough or sneeze. Always remember to wash your hands.  Get Help Right Away If: You develop worsening fever or sinus pain. You develop a severe head ache or visual changes. Your symptoms persist after you have  completed your treatment plan.  Make sure you Understand these instructions. Will watch your condition. Will get help right away if you are not doing well or get worse.  Your e-visit answers were reviewed by a board certified advanced clinical practitioner to complete your personal care plan.  Depending on the condition, your plan could have included both over the counter or prescription medications.  If there is a problem please reply  once you have received a response from your provider.  Your safety is important to us .  If you have drug allergies check your prescription carefully.    You can use MyChart to ask questions about todays visit, request a non-urgent call back, or ask for a work or school excuse for 24 hours related to this e-Visit. If it has been greater than 24 hours you will need to follow up with your provider, or enter a new e-Visit to address those concerns.  You will get an e-mail in the next two days asking about your experience.  I hope that your e-visit has been valuable and will speed your recovery. Thank you for using e-visits.  I have spent 5 minutes in review of e-visit questionnaire, review and updating patient chart, medical decision making and response to patient.   Delon CHRISTELLA Dickinson, PA-C

## 2024-08-15 ENCOUNTER — Ambulatory Visit
Admission: EM | Admit: 2024-08-15 | Discharge: 2024-08-15 | Disposition: A | Discharge status: Home / Self Care | Source: Home / Self Care

## 2024-08-15 ENCOUNTER — Encounter: Payer: Self-pay | Admitting: Emergency Medicine

## 2024-08-15 DIAGNOSIS — J0110 Acute frontal sinusitis, unspecified: Secondary | ICD-10-CM

## 2024-08-15 DIAGNOSIS — R6889 Other general symptoms and signs: Secondary | ICD-10-CM | POA: Diagnosis not present

## 2024-08-15 LAB — POC SOFIA SARS ANTIGEN FIA: SARS Coronavirus 2 Ag: NEGATIVE

## 2024-08-15 LAB — POCT INFLUENZA A/B
Influenza A, POC: NEGATIVE
Influenza B, POC: NEGATIVE

## 2024-08-15 MED ORDER — METHYLPREDNISOLONE 4 MG PO TBPK: ORAL_TABLET | ORAL | 0 refills | Status: DC

## 2024-08-15 MED ORDER — PROMETHAZINE-DM 6.25-15 MG/5ML PO SYRP: 5.0000 mL | ORAL_SOLUTION | Freq: Four times a day (QID) | ORAL | 0 refills | Status: DC | PRN

## 2024-08-15 MED ORDER — BENZONATATE 100 MG PO CAPS: 200.0000 mg | ORAL_CAPSULE | Freq: Three times a day (TID) | ORAL | 0 refills | Status: DC

## 2024-08-15 NOTE — ED Triage Notes (Signed)
 Pt present facial pain with nasal drainage, symptom started On Sunday. Pt did a virtual visit and was prescribed medication but symptom has not gotten better.

## 2024-08-15 NOTE — ED Provider Notes (Signed)
 [facility]    CSN: [id number] Arrival date & time: 08/15/24  0803      History   Chief Complaint Chief Complaint  Patient presents with   Facial Pain    HPI ISAIAS DOWSON is a 58 y.o. male.   HPI  58 year old male with past medical history significant for idiopathic peripheral neuropathy, OSA, hyperlipidemia, type 2 diabetes, MDD, hypertension presenting for a second opinion regarding respiratory symptoms that began 4 days ago.  He is reporting sinus pressure and facial pain, chest tightness, and a cough that became productive for yellow mucus this morning.  No fever, shortness breath, or wheezing.  Past Medical History:  Diagnosis Date   Acute prostatitis without hematuria 05/20/2022   Allergy    Anxiety    Atypical pneumonia 12/18/2022   Complication of anesthesia    pt states at [facility] he stopped breathing due to his sleep apnea-hard to wake up   COVID-19    Depression    Diabetic ulcer of both feet (HCC)    DM (diabetes mellitus), type 2 (HCC)    History of kidney stones    History of methicillin resistant staphylococcus aureus (MRSA) 09/2020   foot   Neuromuscular disorder (HCC)    neuropathy of back and bilateral feet   Obesity    Sleep apnea    does not use cpap    Patient Active Problem List   Diagnosis Date Noted   Obesity (BMI 30-39.9) 07/14/2024   Depression with anxiety 07/14/2024   Acute pain of right lower extremity 07/08/2024   Type 2 diabetes mellitus with diabetic foot infection (HCC) 07/08/2024   Cellulitis of right lower extremity 07/08/2024   Obesity, diabetes, and hypertension syndrome (HCC) 06/12/2024   Kidney stone on left side 02/15/2024   Dyslipidemia 02/15/2024   Essential hypertension 02/15/2024   Type 2 diabetes mellitus with peripheral neuropathy (HCC) 02/15/2024   Uncontrolled type 2 diabetes mellitus with hyperglycemia, with long-term current use of insulin  (HCC) 07/11/2023   HTN (hypertension) 07/11/2023    Obesity, Class III, BMI 40-49.9 (morbid obesity) (HCC) 07/11/2023   Hospital discharge follow-up 12/31/2022   Constipation 05/20/2022   S/P transmetatarsal amputation of foot, right (HCC) 10/25/2020   Major depressive disorder, recurrent 10/25/2020   Polyneuropathy, 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   Diabetic ulcer of foot associated with diabetes mellitus due to underlying condition, with fat layer exposed (HCC) 11/29/2019   Hyperlipidemia associated with type 2 diabetes mellitus (HCC) 09/22/2018   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) 07/14/2015   Vitamin D  deficiency 11/08/2013   Chronic refractory osteomyelitis of right foot (HCC) 01/24/2013   Idiopathic peripheral neuropathy 01/24/2013   OSA (obstructive sleep apnea) 01/24/2013   Elevated hemoglobin 01/24/2013    Past Surgical History:  Procedure Laterality Date   AMPUTATION Left    toe amputation   CYSTOSCOPY/URETEROSCOPY/HOLMIUM LASER/STENT PLACEMENT Right 07/29/2022   Procedure: CYSTOSCOPY/URETEROSCOPY/HOLMIUM LASER/STENT PLACEMENT;  Surgeon: Francisca Redell JAYSON, MD;  Location: [facility];  Service: Urology;  Laterality: Right;   EXTRACORPOREAL SHOCK WAVE LITHOTRIPSY Left 02/08/2024   Procedure: LITHOTRIPSY, ESWL;  Surgeon: Twylla Glendia JAYSON, MD;  Location: [facility];  Service: Urology;  Laterality: Left;   EXTRACORPOREAL SHOCK WAVE LITHOTRIPSY Left 02/15/2024   Procedure: LITHOTRIPSY, ESWL;  Surgeon: Penne Knee, MD;  Location: [facility];  Service: Urology;  Laterality: Left;   FOOT SURGERY Right 09/2012   cyst removed   LOWER EXTREMITY ANGIOGRAPHY Right 07/12/2023   Procedure: Lower Extremity  Angiography;  Surgeon: Marea Selinda RAMAN, MD;  Location: [facility] INV[facility] CV [facility];  Service: Cardiovascular;  Laterality: Right;   SPINE SURGERY  2004   nerve damage in spine   TOE AMPUTATION Right 06/30/2016   5 toes       Home Medications    Prior to Admission medications  Medication Sig Start  Date End Date Taking? Authorizing Provider  benzonatate  (TESSALON ) 100 MG capsule Take 2 capsules (200 mg total) by mouth every 8 (eight) hours. 08/15/24  Yes Bernardino Ditch, NP  methylPREDNISolone  (MEDROL  DOSEPAK) 4 MG TBPK tablet Take according to the package insert. 08/15/24  Yes Bernardino Ditch, NP  promethazine-dextromethorphan (PROMETHAZINE-DM) 6.25-15 MG/5ML syrup Take 5 mLs by mouth 4 (four) times daily as needed. 08/15/24  Yes Bernardino Ditch, NP  aspirin  EC 81 MG tablet Take 81 mg by mouth daily. Swallow whole.    [provider]  atorvastatin  (LIPITOR) 20 MG tablet Take 1 tablet (20 mg total) by mouth daily. 06/11/24   Marylynn Verneita CROME, MD  buPROPion  (WELLBUTRIN  XL) 150 MG 24 hr tablet Take 1 tablet (150 mg total) by mouth daily. 07/22/24   Marylynn Verneita CROME, MD  doxycycline  (VIBRA -TABS) 100 MG tablet Take 1 tablet (100 mg total) by mouth 2 (two) times daily. 08/12/24   Vivienne Delon HERO, PA-C  DULoxetine  (CYMBALTA ) 30 MG capsule Take 1 capsule (30 mg total) by mouth daily. 07/22/24   Marylynn Verneita CROME, MD  fluticasone  (FLONASE ) 50 MCG/ACT nasal spray Place 2 sprays into both nostrils daily. 08/12/24   Vivienne Delon HERO, PA-C  Insulin  Glargine Max SoloStar 300 UNIT/ML SOPN INJECT 25 UNITS INTO THE SKIN DAILY. 07/23/24   Marylynn Verneita CROME, MD  Insulin  Pen Needle (PEN NEEDLES) 32G X 6 MM MISC Use to give insulin  once daily. 12/05/23   Marylynn Verneita CROME, MD  metFORMIN  (GLUCOPHAGE -XR) 500 MG 24 hr tablet TAKE 1 TABLET BY MOUTH EVERY DAY WITH BREAKFAST 06/11/24   Tullo, Teresa L, MD  Multiple Vitamins-Minerals (MULTIVITAMIN WITH MINERALS) tablet Take 1 tablet by mouth daily.    [provider]  oxyCODONE  (ROXICODONE ) 5 MG immediate release tablet Take 1.5 tablets (7.5 mg total) by mouth daily. As needed for severe pain 07/18/24   Marylynn Verneita CROME, MD  SODIUM FLUORIDE 5000 SENSITIVE 1.1-5 % GEL Brush as directed by your provider. 06/20/23   [provider]  tamsulosin  (FLOMAX ) 0.4  MG CAPS capsule Take 1 capsule (0.4 mg total) by mouth daily. 05/03/24   Vaillancourt, Samantha, PA-C  telmisartan  (MICARDIS ) 20 MG tablet Take 1 tablet (20 mg total) by mouth daily. TAKE 1 TABLET BY MOUTH EVERYDAY AT BEDTIME 06/11/24   Marylynn Verneita CROME, MD  tirzepatide  (MOUNJARO ) 5 MG/0.5ML Pen Inject 5 mg into the skin once a week. 07/22/24   Marylynn Verneita CROME, MD    Family History Family History  Problem Relation Age of Onset   Heart disease Father    Stroke Father    Heart attack Father 27   Diabetes Father    Heart disease Maternal Grandmother    Stroke Maternal Grandmother    Heart disease Paternal Grandfather    Stroke Paternal Grandfather     Social History Social History[1]   Allergies   Patient has no known allergies.   Review of Systems Review of Systems  Constitutional:  Negative for fever.  HENT:  Positive for congestion and sinus pressure. Negative for ear pain.   Respiratory:  Positive for cough. Negative for shortness of breath and  wheezing.      Physical Exam Triage Vital Signs ED Triage Vitals  Encounter Vitals Group     BP      Girls Systolic BP Percentile      Girls Diastolic BP Percentile      Boys Systolic BP Percentile      Boys Diastolic BP Percentile      Pulse      Resp      Temp      Temp src      SpO2      Weight      Height      Head Circumference      Peak Flow      Pain Score      Pain Loc      Pain Education      Exclude from Growth Chart    No data found.  Updated Vital Signs BP (!) 149/78 (BP Location: Right Arm)   Pulse 84   Temp 97.8 F (36.6 C) (Oral)   Resp 18   Wt 231 lb 14.4 oz (105.2 kg)   SpO2 96%   BMI 34.25 kg/m   Visual Acuity Right Eye Distance:   Left Eye Distance:   Bilateral Distance:    Right Eye Near:   Left Eye Near:    Bilateral Near:     Physical Exam Vitals and nursing note reviewed.  Constitutional:      Appearance: Normal appearance. He is not ill-appearing.  HENT:     Head:  Normocephalic and atraumatic.     Right Ear: Tympanic membrane, ear canal and external ear normal. There is no impacted cerumen.     Left Ear: Tympanic membrane, ear canal and external ear normal. There is no impacted cerumen.     Nose: Congestion and rhinorrhea present.     Comments: Nasal mucosa is edematous and erythematous with yellow discharge in both nares.  Frontal sinuses are tender to compression but maxillary sinuses are benign.    Mouth/Throat:     Mouth: Mucous membranes are moist.     Pharynx: Oropharynx is clear. No oropharyngeal exudate or posterior oropharyngeal erythema.  Cardiovascular:     Rate and Rhythm: Normal rate and regular rhythm.     Pulses: Normal pulses.     Heart sounds: Normal heart sounds. No murmur heard.    No friction rub. No gallop.  Pulmonary:     Effort: Pulmonary effort is normal.     Breath sounds: Normal breath sounds. No wheezing, rhonchi or rales.  Musculoskeletal:     Cervical back: Normal range of motion and neck supple. No tenderness.  Lymphadenopathy:     Cervical: No cervical adenopathy.  Skin:    General: Skin is warm and dry.     Capillary Refill: Capillary refill takes less than 2 seconds.     Findings: No erythema or rash.  Neurological:     General: No focal deficit present.     Mental Status: He is alert and oriented to person, place, and time.      UC Treatments / Results  Labs (all labs ordered are listed, but only abnormal results are displayed) Labs Reviewed  POC SOFIA SARS ANTIGEN FIA  POCT INFLUENZA A/B    EKG   Radiology No results found.  Procedures Procedures (including critical care time)  Medications Ordered in UC Medications - No data to display  Initial Impression / Assessment and Plan / UC Course  I have reviewed the triage  vital signs and the nursing notes.  Pertinent labs & imaging results that were available during my care of the patient were reviewed by me and considered in my medical  decision making (see chart for details).   Patient is a nontoxic-appearing 58 year old male presenting with request for second opinion regarding his respiratory symptoms.  The patient had a virtual visit on the 15th and was diagnosed with sinusitis and started on doxycycline  and Flonase .  He was advised to take Mucinex D which she has been taking.  He reports that he has not been taking the Flonase  because it dries him out.  He has significant sinus pressure and feels that since he is not any better he should get a second opinion.  He also list that he was experiencing chest tightness and also dizziness but he declined an EKG that was ordered.  He has been using an albuterol  inhaler to help with the chest tightness.  In the exam room he is not in any acute distress.  He is able to speak in full sentence without dyspnea or tachypnea.  Respiratory rate at triage was 18 with a 96% room air oxygen saturation.  His lungs are clear to auscultation in all fields.  I have advised him that I agree with the diagnosis of sinusitis and he should finish the doxycycline .  He should continue to use plain Mucinex to help break up his mucus and increase his oral fluid intake so that he improves his hydration to help thin his mucus out.  We also discussed performing sinus irrigation.  Additionally, I will start a Medrol  Dosepak to help decrease sinus inflammation and see if it improves his symptoms.  Tessalon  Perles and Promethazine DM cough syrup for cough and congestion as well.   Final Clinical Impressions(s) / UC Diagnoses   Final diagnoses:  Influenza-like symptoms  Acute non-recurrent frontal sinusitis     Discharge Instructions      Please continue the doxycycline  to treat your sinusitis.  I also recommend that you continue using plain Mucinex to help break up the stickiness of your mucus.  Increase your oral fluid intake 12 improve hydration can also thin out your mucus make it easier to clear.  Perform  sinus irrigation 2-3 times a day using distilled water to help alleviate your mucus burden.  Begin taking the Medrol  Dosepak according to the package instructions to help decrease upper respiratory tract inflammation and improve your sinus pressure.  Use the Tessalon  Perles every 8 hours as needed for cough.  Take them with a small sip of water.  They may give you numbness to the base of your tongue, or metallic taste in your mouth, this is normal.  You may use the Promethazine DM cough syrup at bedtime as needed for cough and congestion.  You may also continue to use your albuterol  inhaler, 1 to 2 puffs every 4-6 hours, as needed for any chest tightness, shortness breath, or wheezing.  If you develop any new or worsening symptoms present return for reevaluation or see your primary care provider.     ED Prescriptions     Medication Sig Dispense Auth. Provider   methylPREDNISolone  (MEDROL  DOSEPAK) 4 MG TBPK tablet Take according to the package insert. 1 each Bernardino Ditch, NP   benzonatate  (TESSALON ) 100 MG capsule Take 2 capsules (200 mg total) by mouth every 8 (eight) hours. 21 capsule Bernardino Ditch, NP   promethazine-dextromethorphan (PROMETHAZINE-DM) 6.25-15 MG/5ML syrup Take 5 mLs by mouth 4 (four) times  daily as needed. 118 mL Bernardino Ditch, NP      PDMP not reviewed this encounter.    [1]  Social History Tobacco Use   Smoking status: Never    Passive exposure: Never   Smokeless tobacco: Never  Vaping Use   Vaping status: Never Used  Substance Use Topics   Alcohol use: Never   Drug use: Never     Bernardino Ditch, NP 08/15/24 [phone]

## 2024-08-15 NOTE — Discharge Instructions (Addendum)
 Please continue the doxycycline  to treat your sinusitis.  I also recommend that you continue using plain Mucinex to help break up the stickiness of your mucus.  Increase your oral fluid intake 12 improve hydration can also thin out your mucus make it easier to clear.  Perform sinus irrigation 2-3 times a day using distilled water to help alleviate your mucus burden.  Begin taking the Medrol  Dosepak according to the package instructions to help decrease upper respiratory tract inflammation and improve your sinus pressure.  Use the Tessalon  Perles every 8 hours as needed for cough.  Take them with a small sip of water.  They may give you numbness to the base of your tongue, or metallic taste in your mouth, this is normal.  You may use the Promethazine DM cough syrup at bedtime as needed for cough and congestion.  You may also continue to use your albuterol  inhaler, 1 to 2 puffs every 4-6 hours, as needed for any chest tightness, shortness breath, or wheezing.  If you develop any new or worsening symptoms present return for reevaluation or see your primary care provider.

## 2024-08-16 ENCOUNTER — Encounter: Payer: Self-pay | Admitting: Internal Medicine

## 2024-08-17 ENCOUNTER — Other Ambulatory Visit: Payer: Self-pay | Admitting: Internal Medicine

## 2024-08-19 NOTE — Telephone Encounter (Signed)
 Reviewed PDMP which shows patient filled refill for Oxycodone  5 mg IR on 08/16/24. He should not be due for a refill based on PDMP review. Patient may have sent this message before he picked up the refill on the 19th. I am declining refill at this time.   Darrell Shade, MD

## 2024-09-11 ENCOUNTER — Telehealth: Payer: Self-pay

## 2024-09-11 ENCOUNTER — Ambulatory Visit
Admission: EM | Admit: 2024-09-11 | Discharge: 2024-09-11 | Disposition: A | Discharge status: Home / Self Care | Attending: Emergency Medicine | Admitting: Emergency Medicine

## 2024-09-11 ENCOUNTER — Encounter: Payer: Self-pay | Admitting: Emergency Medicine

## 2024-09-11 DIAGNOSIS — J069 Acute upper respiratory infection, unspecified: Secondary | ICD-10-CM

## 2024-09-11 DIAGNOSIS — E1169 Type 2 diabetes mellitus with other specified complication: Secondary | ICD-10-CM

## 2024-09-11 DIAGNOSIS — E1165 Type 2 diabetes mellitus with hyperglycemia: Secondary | ICD-10-CM

## 2024-09-11 DIAGNOSIS — R0981 Nasal congestion: Secondary | ICD-10-CM | POA: Diagnosis not present

## 2024-09-11 LAB — POC SOFIA SARS ANTIGEN FIA: SARS Coronavirus 2 Ag: NEGATIVE

## 2024-09-11 MED ORDER — PROMETHAZINE-DM 6.25-15 MG/5ML PO SYRP: 5.0000 mL | ORAL_SOLUTION | Freq: Four times a day (QID) | ORAL | 0 refills | Status: AC | PRN

## 2024-09-11 MED ORDER — BENZONATATE 100 MG PO CAPS: 200.0000 mg | ORAL_CAPSULE | Freq: Three times a day (TID) | ORAL | 0 refills | Status: AC

## 2024-09-11 MED ORDER — IPRATROPIUM BROMIDE 0.06 % NA SOLN: 2.0000 | Freq: Four times a day (QID) | NASAL | 12 refills | Status: AC

## 2024-09-11 NOTE — Telephone Encounter (Signed)
 Attempted to reach the patient son's unable to connect, as there was an error call cannot completed at this time. Will attempt again later.

## 2024-09-11 NOTE — ED Triage Notes (Signed)
 Pt c/o a dry cough, sinus pressure, burning sensation in nose and runny nose x 3 days. Pt has taken Mucinex for his symptoms.

## 2024-09-11 NOTE — Telephone Encounter (Signed)
 Sent MyChart message to patient to follow-up.

## 2024-09-11 NOTE — Discharge Instructions (Signed)
 Your COVID test today was negative.  I do think that you have a viral respiratory infection however.  Use over-the-counter Tylenol  and/or ibuprofen according to the package instructions as needed for any pain or if you develop a fever.  Use the Atrovent  nasal spray, 2 squirts in each nostril every 6 hours, as needed for runny nose and postnasal drip.  Use the Tessalon  Perles every 8 hours during the day.  Take them with a small sip of water.  They may give you some numbness to the base of your tongue or a metallic taste in your mouth, this is normal.  Use the Promethazine  DM cough syrup at bedtime for cough and congestion.  It will make you drowsy so do not take it during the day.  Return for reevaluation or see your primary care provider for any new or worsening symptoms.

## 2024-09-11 NOTE — ED Provider Notes (Signed)
 " [facility]    CSN: [id number] Arrival date & time: 09/11/24  0809      History   Chief Complaint Chief Complaint  Patient presents with   sinus pressure    Nasal Congestion   Cough    HPI [name] is a 59 y.o. male.   HPI  59 year old male with past medical history significant for hypertension, depression, sleep apnea, obesity, diabetes, anxiety, and history of MRSA presents for evaluation of 3 days worth of respiratory symptoms to include sinus pressure and burning in his nasal passages, light yellow nasal discharge, and a nonproductive cough.  No fever, shortness breath, or wheezing.  No travel of state or country.  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  Past Medical History:  Diagnosis Date   Acute prostatitis without hematuria 05/20/2022   Allergy    Anxiety    Atypical pneumonia 12/18/2022   Complication of anesthesia    pt states at [facility] he stopped breathing due to his sleep apnea-hard to wake up   COVID-19    Depression    Diabetic ulcer of both feet (HCC)    DM (diabetes mellitus), type 2 (HCC)    History of kidney stones    History of methicillin resistant staphylococcus aureus (MRSA) 09/2020   foot   Neuromuscular disorder (HCC)    neuropathy of back and bilateral feet   Obesity    Sleep apnea    does not use cpap    Patient Active Problem List   Diagnosis Date Noted   Obesity (BMI 30-39.9) 07/14/2024   Depression with anxiety 07/14/2024   Acute pain of right lower extremity 07/08/2024   Type 2 diabetes mellitus with diabetic foot infection (HCC) 07/08/2024   Cellulitis of right lower extremity 07/08/2024   Obesity, diabetes, and hypertension syndrome (HCC) 06/12/2024   Kidney stone on left side 02/15/2024   Dyslipidemia 02/15/2024   Essential hypertension 02/15/2024   Type 2 diabetes mellitus with peripheral neuropathy (HCC) 02/15/2024    Uncontrolled type 2 diabetes mellitus with hyperglycemia, with long-term current use of insulin  (HCC) 07/11/2023   HTN (hypertension) 07/11/2023   Obesity, Class III, BMI 40-49.9 (morbid obesity) (HCC) 07/11/2023   Hospital discharge follow-up 12/31/2022   Constipation 05/20/2022   S/P transmetatarsal amputation of foot, right (HCC) 10/25/2020   Major depressive disorder, recurrent 10/25/2020   Polyneuropathy, 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   Diabetic ulcer of foot associated with diabetes mellitus due to underlying condition, with fat layer exposed (HCC) 11/29/2019   Hyperlipidemia associated with type 2 diabetes mellitus (HCC) 09/22/2018   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) 07/14/2015   Vitamin D  deficiency 11/08/2013   Chronic refractory osteomyelitis of right foot (HCC) 01/24/2013   Idiopathic peripheral neuropathy 01/24/2013   OSA (obstructive sleep apnea) 01/24/2013   Elevated hemoglobin 01/24/2013    Past Surgical History:  Procedure Laterality Date   AMPUTATION Left    toe amputation   CYSTOSCOPY/URETEROSCOPY/HOLMIUM LASER/STENT PLACEMENT Right 07/29/2022   Procedure: CYSTOSCOPY/URETEROSCOPY/HOLMIUM LASER/STENT PLACEMENT;  Surgeon: Francisca Redell JAYSON, MD;  Location: [facility];  Service: Urology;  Laterality: Right;   EXTRACORPOREAL SHOCK WAVE LITHOTRIPSY Left 02/08/2024   Procedure: LITHOTRIPSY, ESWL;  Surgeon: Twylla Glendia JAYSON, MD;  Location: [facility];  Service: Urology;  Laterality: Left;   EXTRACORPOREAL SHOCK WAVE LITHOTRIPSY Left 02/15/2024   Procedure: LITHOTRIPSY, ESWL;  Surgeon: Penne Knee, MD;  Location:  [facility];  Service: Urology;  Laterality: Left;   FOOT SURGERY Right 09/2012   cyst removed   LOWER EXTREMITY ANGIOGRAPHY Right 07/12/2023   Procedure: Lower Extremity Angiography;  Surgeon: Marea Selinda RAMAN, MD;  Location: [facility];  Service: Cardiovascular;  Laterality: Right;   SPINE SURGERY  2004   nerve  damage in spine   TOE AMPUTATION Right 06/30/2016   5 toes       Home Medications    Prior to Admission medications  Medication Sig Start Date End Date Taking? Authorizing Provider  ipratropium (ATROVENT ) 0.06 % nasal spray Place 2 sprays into both nostrils 4 (four) times daily. 09/11/24  Yes Bernardino Ditch, NP  aspirin  EC 81 MG tablet Take 81 mg by mouth daily. Swallow whole.    [provider]  atorvastatin  (LIPITOR) 20 MG tablet Take 1 tablet (20 mg total) by mouth daily. 06/11/24   Marylynn Verneita CROME, MD  benzonatate  (TESSALON ) 100 MG capsule Take 2 capsules (200 mg total) by mouth every 8 (eight) hours. 09/11/24   Bernardino Ditch, NP  buPROPion  (WELLBUTRIN  XL) 150 MG 24 hr tablet TAKE 1 TABLET BY MOUTH EVERY DAY 08/19/24   Tullo, Teresa L, MD  DULoxetine  (CYMBALTA ) 30 MG capsule TAKE 1 CAPSULE BY MOUTH EVERY DAY 08/19/24   Marylynn Verneita CROME, MD  Insulin  Glargine Max SoloStar 300 UNIT/ML SOPN INJECT 25 UNITS INTO THE SKIN DAILY. 07/23/24   Marylynn Verneita CROME, MD  Insulin  Pen Needle (PEN NEEDLES) 32G X 6 MM MISC Use to give insulin  once daily. 12/05/23   Marylynn Verneita CROME, MD  metFORMIN  (GLUCOPHAGE -XR) 500 MG 24 hr tablet TAKE 1 TABLET BY MOUTH EVERY DAY WITH BREAKFAST 06/11/24   Tullo, Teresa L, MD  Multiple Vitamins-Minerals (MULTIVITAMIN WITH MINERALS) tablet Take 1 tablet by mouth daily.    [provider]  promethazine -dextromethorphan (PROMETHAZINE -DM) 6.25-15 MG/5ML syrup Take 5 mLs by mouth 4 (four) times daily as needed. 09/11/24   Bernardino Ditch, NP  SODIUM FLUORIDE 5000 SENSITIVE 1.1-5 % GEL Brush as directed by your provider. 06/20/23   [provider]  tamsulosin  (FLOMAX ) 0.4 MG CAPS capsule Take 1 capsule (0.4 mg total) by mouth daily. 05/03/24   Vaillancourt, Samantha, PA-C  telmisartan  (MICARDIS ) 20 MG tablet Take 1 tablet (20 mg total) by mouth daily. TAKE 1 TABLET BY MOUTH EVERYDAY AT BEDTIME 06/11/24   Marylynn Verneita CROME, MD  tirzepatide  (MOUNJARO ) 5 MG/0.5ML Pen  Inject 5 mg into the skin once a week. 07/22/24   Marylynn Verneita CROME, MD    Family History Family History  Problem Relation Age of Onset   Heart disease Father    Stroke Father    Heart attack Father 49   Diabetes Father    Heart disease Maternal Grandmother    Stroke Maternal Grandmother    Heart disease Paternal Grandfather    Stroke Paternal Grandfather     Social History Social History[1]   Allergies   Patient has no known allergies.   Review of Systems Review of Systems  Constitutional:  Negative for fever.  HENT:  Positive for congestion, rhinorrhea and sinus pressure. Negative for ear pain.   Respiratory:  Positive for cough. Negative for shortness of breath and wheezing.      Physical Exam Triage Vital Signs ED Triage Vitals  Encounter Vitals Group     BP      Girls Systolic BP Percentile      Girls Diastolic BP Percentile  Boys Systolic BP Percentile      Boys Diastolic BP Percentile      Pulse      Resp      Temp      Temp src      SpO2      Weight      Height      Head Circumference      Peak Flow      Pain Score      Pain Loc      Pain Education      Exclude from Growth Chart    No data found.  Updated Vital Signs BP 128/68 (BP Location: Right Arm)   Pulse 98   Temp 98.1 F (36.7 C) (Oral)   Resp 16   Wt 225 lb (102.1 kg)   SpO2 97%   BMI 33.23 kg/m   Visual Acuity Right Eye Distance:   Left Eye Distance:   Bilateral Distance:    Right Eye Near:   Left Eye Near:    Bilateral Near:     Physical Exam Vitals and nursing note reviewed.  Constitutional:      Appearance: Normal appearance. He is not ill-appearing.  HENT:     Head: Normocephalic and atraumatic.     Right Ear: Tympanic membrane, ear canal and external ear normal. There is no impacted cerumen.     Left Ear: Tympanic membrane, ear canal and external ear normal. There is no impacted cerumen.     Nose: Congestion and rhinorrhea present.     Comments: His mucosa is  erythematous and edematous with scant clear discharge in both nares.    Mouth/Throat:     Mouth: Mucous membranes are moist.     Pharynx: Oropharynx is clear. No oropharyngeal exudate or posterior oropharyngeal erythema.  Cardiovascular:     Rate and Rhythm: Normal rate and regular rhythm.     Pulses: Normal pulses.     Heart sounds: Normal heart sounds. No murmur heard.    No friction rub. No gallop.  Pulmonary:     Effort: Pulmonary effort is normal.     Breath sounds: Normal breath sounds. No wheezing, rhonchi or rales.  Musculoskeletal:     Cervical back: Normal range of motion and neck supple. No tenderness.  Lymphadenopathy:     Cervical: No cervical adenopathy.  Skin:    General: Skin is warm and dry.     Capillary Refill: Capillary refill takes less than 2 seconds.     Findings: No rash.  Neurological:     General: No focal deficit present.     Mental Status: He is alert and oriented to person, place, and time.      UC Treatments / Results  Labs (all labs ordered are listed, but only abnormal results are displayed) Labs Reviewed  POC SOFIA SARS ANTIGEN FIA - Normal    EKG   Radiology No results found.  Procedures Procedures (including critical care time)  Medications Ordered in UC Medications - No data to display  Initial Impression / Assessment and Plan / UC Course  I have reviewed the triage vital signs and the nursing notes.  Pertinent labs & imaging results that were available during my care of the patient were reviewed by me and considered in my medical decision making (see chart for details).   Patient is a nontoxic-appearing 66-year-old male presenting for evaluation of 3 days with respiratory symptoms as outlined in HPI above.  His primary complaint is burning  in his nasal passages and sinus pressure.  He is experiencing and light yellow nasal discharge.  He denies any shortness of wheezing and he is able to speak in full sentence without dyspnea or  tachypnea.  His lungs are clear to auscultation all fields.  Include COVID, influenza, viral respiratory illness.  Due to the fact that he has had symptoms for 3 days I will not tested for influenza at this time but I will order a COVID antigen test.  COVID antigen test is negative.  I will discharge patient on the diagnosis of viral URI with cough.  Prescribe Atrovent  nasal spray for his nasal congestion and Tessalon  Perles and Promethazine  DM cough for cough and congestion.  Return precautions reviewed.  Work note provided.   Final Clinical Impressions(s) / UC Diagnoses   Final diagnoses:  Nasal congestion  Viral URI with cough     Discharge Instructions      Your COVID test today was negative.  I do think that you have a viral respiratory infection however.  Use over-the-counter Tylenol  and/or ibuprofen according to the package instructions as needed for any pain or if you develop a fever.  Use the Atrovent  nasal spray, 2 squirts in each nostril every 6 hours, as needed for runny nose and postnasal drip.  Use the Tessalon  Perles every 8 hours during the day.  Take them with a small sip of water.  They may give you some numbness to the base of your tongue or a metallic taste in your mouth, this is normal.  Use the Promethazine  DM cough syrup at bedtime for cough and congestion.  It will make you drowsy so do not take it during the day.  Return for reevaluation or see your primary care provider for any new or worsening symptoms.      ED Prescriptions     Medication Sig Dispense Auth. Provider   benzonatate  (TESSALON ) 100 MG capsule Take 2 capsules (200 mg total) by mouth every 8 (eight) hours. 21 capsule Bernardino Ditch, NP   promethazine -dextromethorphan (PROMETHAZINE -DM) 6.25-15 MG/5ML syrup Take 5 mLs by mouth 4 (four) times daily as needed. 118 mL Bernardino Ditch, NP   ipratropium (ATROVENT ) 0.06 % nasal spray Place 2 sprays into both nostrils 4 (four) times daily. 15 mL Bernardino Ditch, NP      PDMP not reviewed this encounter.     [1]  Social History Tobacco Use   Smoking status: Never    Passive exposure: Never   Smokeless tobacco: Never  Vaping Use   Vaping status: Never Used  Substance Use Topics   Alcohol use: Never   Drug use: Never     Bernardino Ditch, NP 09/11/24 [phone]  "

## 2024-09-11 NOTE — Telephone Encounter (Signed)
 Copied from CRM [id number]. Topic: Clinical - Request for Lab/Test Order >> Sep 11, 2024 10:08 AM Delon DASEN wrote: Reason for CRM: Patient requesting labs before appt if needed- [phone]

## 2024-09-12 ENCOUNTER — Encounter: Payer: Self-pay | Admitting: Internal Medicine

## 2024-09-13 ENCOUNTER — Telehealth: Payer: Self-pay | Admitting: Internal Medicine

## 2024-09-13 ENCOUNTER — Ambulatory Visit: Admitting: Internal Medicine

## 2024-09-13 NOTE — Telephone Encounter (Signed)
 Lm that labs were scheduled for 10/16/24, 2 days before his 10/18/24 appt with Dr Marylynn. Lab orders are in, look around Oct 2025 when Dr Marylynn put them in.

## 2024-09-14 LAB — GLUCOSE, POCT (MANUAL RESULT ENTRY): POC Glucose: 198 mg/dL — AB (ref 70–99)

## 2024-09-14 NOTE — Congregational Nurse Program (Signed)
" °  Dept: [phone]   Congregational Nurse Program Note  Date of Encounter: 09/14/2024  Patient says he is having trouble paying for medications sometimes.  I directed him to [facility] for medication assistance program and also gave him information for over the counter products that are offered by [facility] at reduced cost, for things like cold medicine, acetaminophen , gauze etc....  Patient is diabetic and had not been fasting this am, glucose was 198.  Patient says he has not been taking medication as directed since he has been sick with URI.  Patient was educated about importance of maintaining medication adherence even when feeling sick.  Patient asked about decongestants that would help him with feeling better, I told him to contact his PCP via my chart just to make sure that he could take over the counter medications with his prescribed meds.   Past Medical History: Past Medical History:  Diagnosis Date   Acute prostatitis without hematuria 05/20/2022   Allergy    Anxiety    Atypical pneumonia 12/18/2022   Complication of anesthesia    pt states at [facility] he stopped breathing due to his sleep apnea-hard to wake up   COVID-19    Depression    Diabetic ulcer of both feet (HCC)    DM (diabetes mellitus), type 2 (HCC)    History of kidney stones    History of methicillin resistant staphylococcus aureus (MRSA) 09/2020   foot   Neuromuscular disorder (HCC)    neuropathy of back and bilateral feet   Obesity    Sleep apnea    does not use cpap    Encounter Details:  Community Questionnaire - 09/14/24 1443       Questionnaire   Ask client: Do you give verbal consent for me to treat you today? Yes    Student Assistance N/A    Location Patient Served  [facility]    Encounter Setting CN site    Population Status Housed    [employer] or [place] Insurance    Insurance/Financial Assistance Referral N/A    Medication Referred to Medication Assistance;Referred to Pharmacy  Consultation    Medical Provider Yes    Medical Referrals Made NA    Medical Appointment Completed N/A    Screenings CN Performed (remember to also record results) Blood Glucose    CNP Interventions Other Education;Referred to MyChart    ED Visit Averted N/A            "

## 2024-10-16 ENCOUNTER — Other Ambulatory Visit

## 2024-10-18 ENCOUNTER — Ambulatory Visit: Admitting: Internal Medicine

## 2025-04-17 ENCOUNTER — Ambulatory Visit: Admitting: Physician Assistant
